# Patient Record
Sex: Male | Born: 1985
Health system: Southern US, Community
[De-identification: ages and names within clinical notes are randomized; demographics above are authoritative.]

## PROBLEM LIST (undated history)

## (undated) ENCOUNTER — Emergency Department (HOSPITAL_COMMUNITY): Admission: EM | Payer: 59

## (undated) DIAGNOSIS — J302 Other seasonal allergic rhinitis: Secondary | ICD-10-CM

## (undated) DIAGNOSIS — R42 Dizziness and giddiness: Secondary | ICD-10-CM

## (undated) DIAGNOSIS — K219 Gastro-esophageal reflux disease without esophagitis: Secondary | ICD-10-CM

## (undated) DIAGNOSIS — U071 COVID-19: Secondary | ICD-10-CM

## (undated) DIAGNOSIS — I10 Essential (primary) hypertension: Secondary | ICD-10-CM

## (undated) DIAGNOSIS — F419 Anxiety disorder, unspecified: Secondary | ICD-10-CM

## (undated) DIAGNOSIS — E559 Vitamin D deficiency, unspecified: Secondary | ICD-10-CM

## (undated) HISTORY — DX: Dizziness and giddiness: R42

## (undated) HISTORY — DX: Anxiety disorder, unspecified: F41.9

## (undated) HISTORY — DX: COVID-19: U07.1

---
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
## 2006-02-12 ENCOUNTER — Emergency Department (HOSPITAL_COMMUNITY): Admission: EM | Admit: 2006-02-12 | Discharge: 2006-02-12 | Payer: Self-pay | Admitting: Family Medicine

## 2006-03-18 ENCOUNTER — Emergency Department (HOSPITAL_COMMUNITY): Admission: EM | Admit: 2006-03-18 | Discharge: 2006-03-18 | Payer: Self-pay | Admitting: Family Medicine

## 2006-10-08 ENCOUNTER — Emergency Department (HOSPITAL_COMMUNITY): Admission: EM | Admit: 2006-10-08 | Discharge: 2006-10-09 | Payer: Self-pay | Admitting: Emergency Medicine

## 2006-11-28 ENCOUNTER — Emergency Department (HOSPITAL_COMMUNITY): Admission: EM | Admit: 2006-11-28 | Discharge: 2006-11-28 | Payer: Self-pay | Admitting: Emergency Medicine

## 2008-04-23 ENCOUNTER — Emergency Department (HOSPITAL_COMMUNITY): Admission: EM | Admit: 2008-04-23 | Discharge: 2008-04-23 | Payer: Self-pay | Admitting: *Deleted

## 2009-01-24 ENCOUNTER — Emergency Department (HOSPITAL_COMMUNITY): Admission: EM | Admit: 2009-01-24 | Discharge: 2009-01-24 | Payer: Self-pay | Admitting: Emergency Medicine

## 2009-03-03 ENCOUNTER — Emergency Department (HOSPITAL_COMMUNITY): Admission: EM | Admit: 2009-03-03 | Discharge: 2009-03-03 | Payer: Self-pay | Admitting: Emergency Medicine

## 2009-03-06 ENCOUNTER — Emergency Department (HOSPITAL_COMMUNITY): Admission: EM | Admit: 2009-03-06 | Discharge: 2009-03-06 | Payer: Self-pay | Admitting: Emergency Medicine

## 2009-11-02 ENCOUNTER — Emergency Department (HOSPITAL_COMMUNITY): Admission: EM | Admit: 2009-11-02 | Discharge: 2009-11-02 | Payer: Self-pay | Admitting: Emergency Medicine

## 2010-05-11 LAB — COMPREHENSIVE METABOLIC PANEL
ALT: 65 U/L — ABNORMAL HIGH (ref 0–53)
Albumin: 4.2 g/dL (ref 3.5–5.2)
Alkaline Phosphatase: 46 U/L (ref 39–117)
CO2: 24 mEq/L (ref 19–32)
Calcium: 9.2 mg/dL (ref 8.4–10.5)
Chloride: 103 mEq/L (ref 96–112)
Creatinine, Ser: 1.1 mg/dL (ref 0.4–1.5)
Sodium: 137 mEq/L (ref 135–145)
Total Bilirubin: 0.8 mg/dL (ref 0.3–1.2)
Total Protein: 7.3 g/dL (ref 6.0–8.3)

## 2010-05-11 LAB — DIFFERENTIAL
Eosinophils Relative: 0 % (ref 0–5)
Monocytes Absolute: 0.4 10*3/uL (ref 0.1–1.0)
Neutrophils Relative %: 83 % — ABNORMAL HIGH (ref 43–77)

## 2010-05-11 LAB — CBC
HCT: 48 % (ref 39.0–52.0)
MCH: 31.8 pg (ref 26.0–34.0)
MCV: 89.9 fL (ref 78.0–100.0)
Platelets: 190 10*3/uL (ref 150–400)
RBC: 5.34 MIL/uL (ref 4.22–5.81)
RDW: 13.4 % (ref 11.5–15.5)
WBC: 9.3 10*3/uL (ref 4.0–10.5)

## 2010-07-14 NOTE — Op Note (Signed)
NAMEJABRIEL, VANDUYNE                ACCOUNT NO.:  [id number]   MEDICAL RECORD NO.:  [id number]          PATIENT TYPE:  AMB   LOCATION:  [facility]                          FACILITY:  [facility]   PHYSICIAN:  Prabhakar D. Pendse, M.D.DATE OF BIRTH:  1985/11/09   DATE OF PROCEDURE:  09/27/2004  DATE OF DISCHARGE:                                 OPERATIVE REPORT   PREOPERATIVE DIAGNOSIS:  Abscess of the right thigh.   POSTOPERATIVE DIAGNOSIS:  Abscess of the right thigh.   OPERATION PERFORMED:  Incision and drainage of abscess of the right thigh.   SURGEON:  Prabhakar D. Levie Heritage, M.D.   ASSISTANT:  Nurse.   ANESTHESIA:  Nurse.   OPERATIVE PROCEDURE:  Under satisfactory general anesthesia, the patient in  supine position, right thigh region was thoroughly prepped and draped in the  usual manner.  About 3-cm-long transverse incision was made directly over  the most prominent part of the abscess and abscess cavity entered, drained,  purulent material was obtained, the abscess cavity irrigated, packed with  Iodoform gauze, bulky dressing applied.  Throughout the procedure, the  patient's vital signs remained stable.  The patient withstood the procedure  well and was transferred to recovery room in satisfactory general condition.       PDP/MEDQ  D:  09/27/2004  T:  09/28/2004  Job:  [id number]   cc:   [facility]

## 2010-09-12 ENCOUNTER — Inpatient Hospital Stay (INDEPENDENT_AMBULATORY_CARE_PROVIDER_SITE_OTHER)
Admission: RE | Admit: 2010-09-12 | Discharge: 2010-09-12 | Disposition: A | Discharge status: Home / Self Care | Payer: Self-pay | Source: Ambulatory Visit | Attending: Family Medicine | Admitting: Family Medicine

## 2010-09-12 DIAGNOSIS — R071 Chest pain on breathing: Secondary | ICD-10-CM

## 2010-12-07 LAB — POCT RAPID STREP A: Streptococcus, Group A Screen (Direct): NEGATIVE

## 2010-12-07 LAB — STREP A DNA PROBE: Group A Strep Probe: NEGATIVE

## 2011-02-05 ENCOUNTER — Emergency Department (HOSPITAL_COMMUNITY)
Admission: EM | Admit: 2011-02-05 | Discharge: 2011-02-06 | Disposition: A | Discharge status: Home / Self Care | Payer: Self-pay | Attending: Emergency Medicine | Admitting: Emergency Medicine

## 2011-02-05 ENCOUNTER — Emergency Department (HOSPITAL_COMMUNITY): Payer: Self-pay

## 2011-02-05 ENCOUNTER — Encounter: Payer: Self-pay | Admitting: Adult Health

## 2011-02-05 DIAGNOSIS — R079 Chest pain, unspecified: Secondary | ICD-10-CM | POA: Insufficient documentation

## 2011-02-05 DIAGNOSIS — R1012 Left upper quadrant pain: Secondary | ICD-10-CM | POA: Insufficient documentation

## 2011-02-05 NOTE — ED Notes (Signed)
C/o of left sided pain that began 2 weeks ago and is located under left rib cage, hurts worse with movement, strainnig for  a BM, deep inspiration and cough. Bilateral lung sounds clear. Sats 97%  RA

## 2011-02-06 MED ORDER — HYDROCODONE-ACETAMINOPHEN 5-325 MG PO TABS: 1.0000 | ORAL_TABLET | ORAL | Status: AC | PRN

## 2011-02-06 NOTE — ED Provider Notes (Signed)
History     CSN: [id number] Arrival date & time: 02/05/2011 11:07 PM   First MD Initiated Contact with Patient 02/05/11 2314      Chief Complaint  Patient presents with  . Pleurisy    (Consider location/radiation/quality/duration/timing/severity/associated sxs/prior treatment) HPI History provided by pt.   Pt has had pain in his left side for the past 2 weeks.  Aggravated by coughing and valsalva.  No associated fever, cough, SOB, N/V.  Has had diarrhea.  Denies trauma and no recent heavy lifting.    Past Medical History  Diagnosis Date  . Asthma     History reviewed. No pertinent past surgical history.  History reviewed. No pertinent family history.  History  Substance Use Topics  . Smoking status: Current Some Day Smoker  . Smokeless tobacco: Not on file  . Alcohol Use: No      Review of Systems  All other systems reviewed and are negative.    Allergies  Review of patient's allergies indicates no known allergies.  Home Medications   Current Outpatient Rx  Name Route Sig Dispense Refill  . HYDROCODONE-ACETAMINOPHEN 5-325 MG PO TABS Oral Take 1 tablet by mouth every 4 (four) hours as needed for pain. 20 tablet 0    BP 160/97  Pulse 65  Temp 98.2 F (36.8 C)  Resp 20  SpO2 97%  Physical Exam  Nursing note and vitals reviewed. Constitutional: He is oriented to person, place, and time. He appears well-developed and well-nourished. No distress.  HENT:  Head: Normocephalic and atraumatic.  Mouth/Throat: Oropharynx is clear and moist. No oropharyngeal exudate.  Eyes:       Normal appearance  Neck: Normal range of motion.  Cardiovascular: Normal rate and regular rhythm.   Pulmonary/Chest: Effort normal and breath sounds normal. He exhibits no tenderness.  Abdominal: Soft. Bowel sounds are normal. He exhibits no distension.       Tenderness left side, just inferior to rib cage. No overlying skin changes. No mass. No obvious splenomegaly.    Lymphadenopathy:    He has no cervical adenopathy.  Neurological: He is alert and oriented to person, place, and time.  Skin: Skin is warm and dry. No rash noted.  Psychiatric: He has a normal mood and affect. His behavior is normal.    ED Course  Procedures (including critical care time)   Labs Reviewed  GLUCOSE, CAPILLARY  LAB REPORT - SCANNED   Dg Chest 2 View  02/05/2011  *RADIOLOGY REPORT*  Clinical Data: Chest pain.  CHEST - 2 VIEW  Comparison: 01/24/2009.  Findings: No infiltrate, congestive heart failure or pneumothorax. Heart size within normal limits.  Central pulmonary vascular prominence stable.  IMPRESSION: No acute abnormality.  Original Report Authenticated By: Fuller Canada, M.D.     1. Acute abdominal pain in left upper quadrant       MDM  Healthy 25yo M presents w/ non-traumatic left side pain.  No associated pulmonary sx.  Recent diarrhea but otherwise no GI sx.  No fever.  Mod-sev tenderness left side, just inferior to rib cage on exam.  No obvious splenomegaly.  No signs of mono. CXR neg for pneumonia.  Pt may have a viral illness causing splenomegaly and pain of spleen or may be musculoskeletal.  Explained to pt why further imaging will likely not change our course of tmnt today.  Recommended no contact sports x 4 wks, no heavy lifting, f/u with PCP and return to ER if worsening pain, fever or N/V.  Prescribed vicodin.         Otilio Miu, [place] 02/06/11 1139

## 2011-02-08 NOTE — ED Provider Notes (Signed)
Medical screening examination/treatment/procedure(s) were performed by non-physician practitioner and as supervising physician I was immediately available for consultation/collaboration.  Flint Melter, MD 02/08/11 [phone]

## 2011-07-25 ENCOUNTER — Encounter (HOSPITAL_COMMUNITY): Payer: Self-pay | Admitting: Emergency Medicine

## 2011-07-25 ENCOUNTER — Emergency Department (HOSPITAL_COMMUNITY)
Admission: EM | Admit: 2011-07-25 | Discharge: 2011-07-25 | Discharge status: Against Medical Advice | Payer: Self-pay | Attending: Emergency Medicine | Admitting: Emergency Medicine

## 2011-07-25 DIAGNOSIS — R51 Headache: Secondary | ICD-10-CM | POA: Insufficient documentation
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
## 2011-09-18 ENCOUNTER — Emergency Department (HOSPITAL_COMMUNITY)
Admission: EM | Admit: 2011-09-18 | Discharge: 2011-09-18 | Disposition: A | Discharge status: Home / Self Care | Payer: Self-pay | Attending: Emergency Medicine | Admitting: Emergency Medicine

## 2011-09-18 ENCOUNTER — Encounter (HOSPITAL_COMMUNITY): Payer: Self-pay | Admitting: *Deleted

## 2011-09-18 DIAGNOSIS — T22139A Burn of first degree of unspecified upper arm, initial encounter: Secondary | ICD-10-CM | POA: Insufficient documentation

## 2011-09-18 DIAGNOSIS — F411 Generalized anxiety disorder: Secondary | ICD-10-CM | POA: Insufficient documentation

## 2011-09-18 DIAGNOSIS — T2122XA Burn of second degree of abdominal wall, initial encounter: Secondary | ICD-10-CM | POA: Insufficient documentation

## 2011-09-18 DIAGNOSIS — T2102XA Burn of unspecified degree of abdominal wall, initial encounter: Secondary | ICD-10-CM

## 2011-09-18 DIAGNOSIS — T2200XA Burn of unspecified degree of shoulder and upper limb, except wrist and hand, unspecified site, initial encounter: Secondary | ICD-10-CM

## 2011-09-18 DIAGNOSIS — X118XXA Contact with other hot tap-water, initial encounter: Secondary | ICD-10-CM | POA: Insufficient documentation

## 2011-09-18 MED ORDER — SILVER SULFADIAZINE 1 % EX CREA
TOPICAL_CREAM | Freq: Once | CUTANEOUS | Status: AC
  Administered 2011-09-18: 02:00:00 via TOPICAL
  Filled 2011-09-18: qty 50

## 2011-09-18 MED ORDER — IBUPROFEN 800 MG PO TABS: 800.0000 mg | ORAL_TABLET | Freq: Three times a day (TID) | ORAL | Status: DC

## 2011-09-18 MED ORDER — DIPHENHYDRAMINE HCL 50 MG/ML IJ SOLN
25.0000 mg | Freq: Once | INTRAMUSCULAR | Status: AC
  Administered 2011-09-18: 25 mg via INTRAMUSCULAR
  Filled 2011-09-18 (×2): qty 1

## 2011-09-18 MED ORDER — KETOROLAC TROMETHAMINE 30 MG/ML IJ SOLN
30.0000 mg | Freq: Once | INTRAMUSCULAR | Status: AC
  Administered 2011-09-18: 30 mg via INTRAVENOUS
  Filled 2011-09-18: qty 1
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
## 2011-09-20 ENCOUNTER — Emergency Department (HOSPITAL_COMMUNITY)
Admission: EM | Admit: 2011-09-20 | Discharge: 2011-09-20 | Disposition: A | Discharge status: Home / Self Care | Payer: Self-pay | Attending: Emergency Medicine | Admitting: Emergency Medicine

## 2011-09-20 ENCOUNTER — Encounter (HOSPITAL_COMMUNITY): Payer: Self-pay | Admitting: *Deleted

## 2011-09-20 DIAGNOSIS — R55 Syncope and collapse: Secondary | ICD-10-CM | POA: Insufficient documentation

## 2011-09-20 DIAGNOSIS — I10 Essential (primary) hypertension: Secondary | ICD-10-CM | POA: Insufficient documentation

## 2011-09-20 DIAGNOSIS — Z87891 Personal history of nicotine dependence: Secondary | ICD-10-CM | POA: Insufficient documentation

## 2011-09-20 HISTORY — DX: Essential (primary) hypertension: I10

## 2011-09-20 LAB — POCT I-STAT, CHEM 8
Calcium, Ion: 1.22 mmol/L (ref 1.12–1.23)
Glucose, Bld: 99 mg/dL (ref 70–99)
HCT: 49 % (ref 39.0–52.0)
Hemoglobin: 16.7 g/dL (ref 13.0–17.0)
Potassium: 3.6 mEq/L (ref 3.5–5.1)
Sodium: 144 mEq/L (ref 135–145)

## 2011-09-20 MED ORDER — HYDROCHLOROTHIAZIDE 25 MG PO TABS: 25.0000 mg | ORAL_TABLET | Freq: Every day | ORAL | Status: DC
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
## 2011-12-04 ENCOUNTER — Emergency Department (HOSPITAL_COMMUNITY)
Admission: EM | Admit: 2011-12-04 | Discharge: 2011-12-04 | Disposition: A | Discharge status: Home / Self Care | Payer: Self-pay | Attending: Emergency Medicine | Admitting: Emergency Medicine

## 2011-12-04 ENCOUNTER — Encounter (HOSPITAL_COMMUNITY): Payer: Self-pay | Admitting: *Deleted

## 2011-12-04 DIAGNOSIS — Z87891 Personal history of nicotine dependence: Secondary | ICD-10-CM | POA: Insufficient documentation

## 2011-12-04 DIAGNOSIS — M549 Dorsalgia, unspecified: Secondary | ICD-10-CM | POA: Insufficient documentation

## 2011-12-04 DIAGNOSIS — I10 Essential (primary) hypertension: Secondary | ICD-10-CM | POA: Insufficient documentation

## 2011-12-04 LAB — URINALYSIS, ROUTINE W REFLEX MICROSCOPIC
Glucose, UA: NEGATIVE mg/dL
Hgb urine dipstick: NEGATIVE
Leukocytes, UA: NEGATIVE
Specific Gravity, Urine: 1.016 (ref 1.005–1.030)
pH: 7 (ref 5.0–8.0)

## 2011-12-04 MED ORDER — METHOCARBAMOL 500 MG PO TABS: 1000.0000 mg | ORAL_TABLET | Freq: Four times a day (QID) | ORAL | Status: DC

## 2011-12-04 MED ORDER — HYDROCODONE-ACETAMINOPHEN 5-325 MG PO TABS: ORAL_TABLET | ORAL | Status: DC
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
## 2011-12-04 NOTE — ED Notes (Signed)
Pt reports right hip back pain x2 weeks. Radiates to left hip and around right side to flank. Pt reports trying heat and pain medication with no relief. Denies trauma or fall.

## 2011-12-04 NOTE — ED Provider Notes (Signed)
Medical screening examination/treatment/procedure(s) were performed by non-physician practitioner and as supervising physician I was immediately available for consultation/collaboration.   Gavin Pound. Asim Gersten, MD 12/04/11 2356

## 2011-12-04 NOTE — ED Notes (Signed)
MD at bedside. 

## 2011-12-07 ENCOUNTER — Encounter (HOSPITAL_COMMUNITY): Payer: Self-pay | Admitting: Emergency Medicine

## 2011-12-07 ENCOUNTER — Emergency Department (HOSPITAL_COMMUNITY)
Admission: EM | Admit: 2011-12-07 | Discharge: 2011-12-07 | Disposition: A | Discharge status: Home / Self Care | Payer: Self-pay | Attending: Emergency Medicine | Admitting: Emergency Medicine

## 2011-12-07 DIAGNOSIS — Z87891 Personal history of nicotine dependence: Secondary | ICD-10-CM | POA: Insufficient documentation

## 2011-12-07 DIAGNOSIS — I10 Essential (primary) hypertension: Secondary | ICD-10-CM | POA: Insufficient documentation

## 2011-12-07 DIAGNOSIS — F419 Anxiety disorder, unspecified: Secondary | ICD-10-CM

## 2011-12-07 DIAGNOSIS — R110 Nausea: Secondary | ICD-10-CM | POA: Insufficient documentation

## 2011-12-07 DIAGNOSIS — J45909 Unspecified asthma, uncomplicated: Secondary | ICD-10-CM | POA: Insufficient documentation

## 2011-12-07 DIAGNOSIS — F411 Generalized anxiety disorder: Secondary | ICD-10-CM | POA: Insufficient documentation

## 2011-12-07 MED ORDER — HYDROXYZINE HCL 10 MG PO TABS: 10.0000 mg | ORAL_TABLET | Freq: Three times a day (TID) | ORAL | Status: DC | PRN

## 2011-12-07 MED ORDER — ONDANSETRON HCL 4 MG PO TABS: 4.0000 mg | ORAL_TABLET | Freq: Three times a day (TID) | ORAL | Status: DC | PRN

## 2011-12-07 MED ORDER — ONDANSETRON 8 MG PO TBDP
8.0000 mg | ORAL_TABLET | Freq: Once | ORAL | Status: AC
  Administered 2011-12-07: 8 mg via ORAL
  Filled 2011-12-07: qty 1

## 2011-12-07 NOTE — ED Notes (Signed)
Pt presenting to ed with c/o elevated blood pressure and abdominal pain with positive nausea no vomiting. Pt states his friend recently died and he has some family issues going on. Pt states he's really stressed. Pt states he's also been feeling like he's about to pass out. Pt states he thinks he has anxiety.

## 2011-12-07 NOTE — ED Provider Notes (Signed)
History     CSN: [id number]  Arrival date & time 12/07/11  1140   First MD Initiated Contact with Patient 12/07/11 1210      Chief Complaint  Patient presents with  . Abdominal Pain  . Nausea    (Consider location/radiation/quality/duration/timing/severity/associated sxs/prior treatment) HPI  26 y.o. male in no acute distress complaining of nausea and decreased by mouth intake over the last 48 hours. Patient denies any fever, abdominal pain, change in bowel or bladder habits. Patient had a friend passed away 3 days ago and has been under stress since that time he feels like he may have anxiety disorder and has had panic attack in the last 48 hours he is currently not an acute anxiety. He also reports a lightheaded sensation and exacerbated by walking.  Past Medical History  Diagnosis Date  . Asthma   . Hypertension     History reviewed. No pertinent past surgical history.  No family history on file.  History  Substance Use Topics  . Smoking status: Former Games developer  . Smokeless tobacco: Not on file  . Alcohol Use: No      Review of Systems  Constitutional: Negative for fever.  Respiratory: Negative for shortness of breath.   Cardiovascular: Negative for chest pain.  Gastrointestinal: Positive for nausea. Negative for vomiting, abdominal pain and diarrhea.  Neurological: Positive for light-headedness.  All other systems reviewed and are negative.    Allergies  Review of patient's allergies indicates no known allergies.  Home Medications   Current Outpatient Rx  Name Route Sig Dispense Refill  . CETIRIZINE HCL 10 MG PO TABS Oral Take 10 mg by mouth daily as needed. For allergies.    Marland Kitchen HYDROCHLOROTHIAZIDE 25 MG PO TABS Oral Take 1 tablet (25 mg total) by mouth daily. 30 tablet 1  . HYDROCODONE-ACETAMINOPHEN 5-325 MG PO TABS  Take 1-2 tablets every 6 hours as needed for severe pain 8 tablet 0  . IBUPROFEN 200 MG PO TABS Oral Take 400 mg by mouth every 8 (eight)  hours as needed. For pain.    Marland Kitchen METHOCARBAMOL 500 MG PO TABS Oral Take 2 tablets (1,000 mg total) by mouth 4 (four) times daily. 20 tablet 0  . ADULT MULTIVITAMIN W/MINERALS CH Oral Take 1 tablet by mouth daily.      BP 142/107  Pulse 100  Temp 98.2 F (36.8 C) (Oral)  Resp 20  SpO2 98%  Physical Exam  Nursing note and vitals reviewed. Constitutional: He is oriented to person, place, and time. He appears well-developed and well-nourished. No distress.  HENT:  Head: Normocephalic.  Mouth/Throat: Oropharynx is clear and moist.       Palpebral conjunctiva are ruddy.  Eyes: Conjunctivae normal and EOM are normal. Pupils are equal, round, and reactive to light.  Cardiovascular: Normal rate and intact distal pulses.   Pulmonary/Chest: Effort normal and breath sounds normal. No stridor. No respiratory distress. He has no wheezes. He has no rales. He exhibits no tenderness.  Abdominal: Soft. Bowel sounds are normal. He exhibits no distension and no mass. There is no tenderness. There is no rebound and no guarding.  Musculoskeletal: Normal range of motion.  Neurological: He is alert and oriented to person, place, and time.  Skin: Skin is warm.  Psychiatric: He has a normal mood and affect.    ED Course  Procedures (including critical care time)  Labs Reviewed - No data to display No results found.   1. Anxiety  MDM  Physical exam is normal I think this is likely an anxiety attack. We'll give the patient Atarax and Zofran for nausea control. I will advise the patient to educate himself on anxiety and panic disorder so that stronger medications or not needed in the future.    Pt verbalized understanding and agrees with care plan. Outpatient follow-up and return precautions given.    New Prescriptions   HYDROXYZINE (ATARAX/VISTARIL) 10 MG TABLET    Take 1 tablet (10 mg total) by mouth 3 (three) times daily as needed for anxiety.   ONDANSETRON (ZOFRAN) 4 MG TABLET    Take 1  tablet (4 mg total) by mouth every 8 (eight) hours as needed for nausea.       Wynetta Emery, PA-C 12/07/11 1228

## 2011-12-07 NOTE — ED Notes (Signed)
Pt states "I've been dizzy & nauseated x 2 days, got a lot of family issues going on and I had a friend just die"

## 2011-12-07 NOTE — ED Provider Notes (Signed)
Medical screening examination/treatment/procedure(s) were performed by non-physician practitioner and as supervising physician I was immediately available for consultation/collaboration.   Lyanne Co, MD 12/07/11 [phone]

## 2011-12-08 ENCOUNTER — Emergency Department (HOSPITAL_COMMUNITY)
Admission: EM | Admit: 2011-12-08 | Discharge: 2011-12-08 | Disposition: A | Discharge status: Home / Self Care | Payer: Self-pay | Attending: Emergency Medicine | Admitting: Emergency Medicine

## 2011-12-08 ENCOUNTER — Encounter (HOSPITAL_COMMUNITY): Payer: Self-pay | Admitting: *Deleted

## 2011-12-08 DIAGNOSIS — K068 Other specified disorders of gingiva and edentulous alveolar ridge: Secondary | ICD-10-CM

## 2011-12-08 DIAGNOSIS — K0510 Chronic gingivitis, plaque induced: Secondary | ICD-10-CM | POA: Insufficient documentation

## 2011-12-08 DIAGNOSIS — I10 Essential (primary) hypertension: Secondary | ICD-10-CM | POA: Insufficient documentation

## 2011-12-08 DIAGNOSIS — Z87891 Personal history of nicotine dependence: Secondary | ICD-10-CM | POA: Insufficient documentation

## 2011-12-08 DIAGNOSIS — K055 Other periodontal diseases: Secondary | ICD-10-CM | POA: Insufficient documentation

## 2011-12-08 LAB — CBC WITH DIFFERENTIAL/PLATELET
Basophils Absolute: 0 10*3/uL (ref 0.0–0.1)
HCT: 50.7 % (ref 39.0–52.0)
Hemoglobin: 18.7 g/dL — ABNORMAL HIGH (ref 13.0–17.0)
Lymphocytes Relative: 24 % (ref 12–46)
Lymphs Abs: 1.7 10*3/uL (ref 0.7–4.0)
MCV: 87.3 fL (ref 78.0–100.0)
Monocytes Absolute: 0.4 10*3/uL (ref 0.1–1.0)
Monocytes Relative: 6 % (ref 3–12)
Neutro Abs: 4.9 10*3/uL (ref 1.7–7.7)
RBC: 5.81 MIL/uL (ref 4.22–5.81)
RDW: 13.2 % (ref 11.5–15.5)
WBC: 7 10*3/uL (ref 4.0–10.5)

## 2011-12-08 NOTE — ED Notes (Signed)
Pt states that his gums "just started bleeding." Pt states he has had issues with this before where "my gums just start bleeding sometimes." Denies any injury to area. Area that is bleeding is in back of right upper side.

## 2011-12-08 NOTE — ED Provider Notes (Signed)
History     CSN: [id number]  Arrival date & time 12/08/11  1826   First MD Initiated Contact with Patient 12/08/11 2030      Chief Complaint  Patient presents with  . Dental Pain   HPI  History provided by the patient. Patient is a 26 year old male with history of asthma and hypertension who presents with complaints of continuing need bleeding from right upper gum and mouth. Patient denies having any injury or trauma but reports having some bleeding that has been steadily oozing for the past several hours. Patient has been trying to rinse his mouth but has not had any improvement. He has not used any other treatments to stop the bleeding. He has no prior history of bleeding disorders. He denies heavy alcohol use. Patient denies any lightheadedness, shortness of breath or fatigue.    Past Medical History  Diagnosis Date  . Asthma   . Hypertension     History reviewed. No pertinent past surgical history.  No family history on file.  History  Substance Use Topics  . Smoking status: Former Games developer  . Smokeless tobacco: Not on file  . Alcohol Use: No      Review of Systems  Constitutional: Negative for fever, chills and fatigue.  HENT: Negative for sore throat, mouth sores and dental problem.   Gastrointestinal: Negative for nausea and vomiting.  Neurological: Negative for dizziness, syncope and light-headedness.    Allergies  Review of patient's allergies indicates no known allergies.  Home Medications   Current Outpatient Rx  Name Route Sig Dispense Refill  . CETIRIZINE HCL 10 MG PO TABS Oral Take 10 mg by mouth daily as needed. For allergies.    Marland Kitchen HYDROCHLOROTHIAZIDE 25 MG PO TABS Oral Take 1 tablet (25 mg total) by mouth daily. 30 tablet 1  . IBUPROFEN 200 MG PO TABS Oral Take 400 mg by mouth every 8 (eight) hours as needed. For pain.    . ADULT MULTIVITAMIN W/MINERALS CH Oral Take 1 tablet by mouth daily.      BP 141/101  Pulse 99  Temp 98.4 F (36.9 C)  (Oral)  Resp 16  Ht 5\' 11"  (1.803 m)  Wt 240 lb (108.863 kg)  BMI 33.47 kg/m2  SpO2 96%  Physical Exam  Nursing note and vitals reviewed. Constitutional: He is oriented to person, place, and time. He appears well-developed and well-nourished. No distress.  HENT:  Head: Normocephalic.  Mouth/Throat:         Small steady bleeding around the gums of right upper second and third molar. There is dental care he to the anterior outer part of the third molar around source of bleeding. The gums are swollen.  Neck: Normal range of motion. Neck supple.  Cardiovascular: Normal rate and regular rhythm.   No murmur heard. Pulmonary/Chest: Effort normal and breath sounds normal. No respiratory distress. He has no wheezes. He has no rales.  Lymphadenopathy:    He has no cervical adenopathy.  Neurological: He is alert and oriented to person, place, and time.  Skin: Skin is warm.  Psychiatric: He has a normal mood and affect. His behavior is normal.    ED Course  Procedures   I-STAT chem 8 Sodium 143, potassium 4.0, chloride 106, I calcium 1.21, CO2 25, glucose 107, UN 18, creatinine 1.1, hemoglobin 19.0, hematocrit 56%    1. Bleeding gums   2. Gingivitis       MDM  9:00PM patient seen and evaluated. Patient is in no  acute distress.    Bleeding was controlled with packing and gauze.    Angus Seller, [place] 12/09/11 2123

## 2011-12-08 NOTE — ED Notes (Addendum)
Patient reports he noted onset of spitting up blood/bleeding from his gums an hour ago.  He states he does not have pain.  He has had hot flashes and has felt nauseated.  Patient states he has intermittent bleed from his mouth for 3 weeks

## 2011-12-10 LAB — POCT I-STAT, CHEM 8
BUN: 18 mg/dL (ref 6–23)
Calcium, Ion: 1.21 mmol/L (ref 1.12–1.23)
Chloride: 106 mEq/L (ref 96–112)
Creatinine, Ser: 1.1 mg/dL (ref 0.50–1.35)

## 2011-12-10 NOTE — ED Provider Notes (Signed)
Medical screening examination/treatment/procedure(s) were performed by non-physician practitioner and as supervising physician I was immediately available for consultation/collaboration.  Doug Sou, MD 12/10/11 0120

## 2012-02-10 ENCOUNTER — Emergency Department (HOSPITAL_COMMUNITY)
Admission: EM | Admit: 2012-02-10 | Discharge: 2012-02-10 | Discharge status: Against Medical Advice | Payer: Self-pay | Attending: Emergency Medicine | Admitting: Emergency Medicine

## 2012-02-10 DIAGNOSIS — H538 Other visual disturbances: Secondary | ICD-10-CM | POA: Insufficient documentation

## 2012-02-10 DIAGNOSIS — R51 Headache: Secondary | ICD-10-CM | POA: Insufficient documentation

## 2012-02-10 DIAGNOSIS — I10 Essential (primary) hypertension: Secondary | ICD-10-CM | POA: Insufficient documentation

## 2012-02-10 NOTE — ED Notes (Signed)
Pt comes to window and states "I just would like to leave instead of stay." pt encouraged to stay. Pt insisted on leaving.

## 2012-02-10 NOTE — ED Notes (Signed)
Pt arrives by [facility] c/o pressure behind head and blurred vision. Reports hx of htn and stress.

## 2012-04-07 ENCOUNTER — Emergency Department (HOSPITAL_COMMUNITY)
Admission: EM | Admit: 2012-04-07 | Discharge: 2012-04-07 | Disposition: A | Discharge status: Home / Self Care | Payer: Self-pay | Attending: Emergency Medicine | Admitting: Emergency Medicine

## 2012-04-07 ENCOUNTER — Encounter (HOSPITAL_COMMUNITY): Payer: Self-pay | Admitting: Emergency Medicine

## 2012-04-07 DIAGNOSIS — M5431 Sciatica, right side: Secondary | ICD-10-CM

## 2012-04-07 DIAGNOSIS — I10 Essential (primary) hypertension: Secondary | ICD-10-CM | POA: Insufficient documentation

## 2012-04-07 DIAGNOSIS — J45909 Unspecified asthma, uncomplicated: Secondary | ICD-10-CM | POA: Insufficient documentation

## 2012-04-07 DIAGNOSIS — R209 Unspecified disturbances of skin sensation: Secondary | ICD-10-CM | POA: Insufficient documentation

## 2012-04-07 DIAGNOSIS — Z79899 Other long term (current) drug therapy: Secondary | ICD-10-CM | POA: Insufficient documentation

## 2012-04-07 DIAGNOSIS — M5430 Sciatica, unspecified side: Secondary | ICD-10-CM | POA: Insufficient documentation

## 2012-04-07 DIAGNOSIS — Z87891 Personal history of nicotine dependence: Secondary | ICD-10-CM | POA: Insufficient documentation

## 2012-04-07 DIAGNOSIS — Z760 Encounter for issue of repeat prescription: Secondary | ICD-10-CM | POA: Insufficient documentation

## 2012-04-07 MED ORDER — HYDROCODONE-ACETAMINOPHEN 5-325 MG PO TABS: 1.0000 | ORAL_TABLET | ORAL | Status: DC | PRN

## 2012-04-07 MED ORDER — IBUPROFEN 400 MG PO TABS
600.0000 mg | ORAL_TABLET | Freq: Once | ORAL | Status: AC
  Administered 2012-04-07: 600 mg via ORAL
  Filled 2012-04-07: qty 1

## 2012-04-07 MED ORDER — IBUPROFEN 600 MG PO TABS: 600.0000 mg | ORAL_TABLET | Freq: Four times a day (QID) | ORAL | Status: DC | PRN

## 2012-04-07 MED ORDER — METHOCARBAMOL 500 MG PO TABS: 500.0000 mg | ORAL_TABLET | Freq: Two times a day (BID) | ORAL | Status: DC

## 2012-04-07 MED ORDER — HYDROCHLOROTHIAZIDE 25 MG PO TABS: 25.0000 mg | ORAL_TABLET | Freq: Every day | ORAL | Status: DC

## 2012-04-07 NOTE — ED Provider Notes (Addendum)
History     This chart was scribed for Derwood Kaplan, MD, MD by Smitty Pluck, ED Scribe. The patient was seen in room TR05C/TR05C and the patient's care was started at 4:15 PM.   CSN: [id number]  Arrival date & time 04/07/12  1417      No chief complaint on file.   (Consider location/radiation/quality/duration/timing/severity/associated sxs/prior treatment) The history is provided by the patient. No language interpreter was used.   Walter Howell is a 27 y.o. male with hx of HTN and asthma who presents to the Emergency Department complaining of constant, sharp right lower back pain that radiates down right leg onset 5 days ago. He reports having tingling sensation in right leg. He reports that he has pain when having bowel movement. He has taken ibuprofen without relief. He reports pain is aggravated by pain. Pt denies any trauma, injury, lifting heavy objects, bowel incontinence, fever, chills, nausea, vomiting, diarrhea, weakness, cough, SOB and any other pain. He denies hx of similar pain. He reports that he ran out of HTN medication.    Past Medical History  Diagnosis Date  . Asthma   . Hypertension     History reviewed. No pertinent past surgical history.  No family history on file.  History  Substance Use Topics  . Smoking status: Former Games developer  . Smokeless tobacco: Not on file  . Alcohol Use: No      Review of Systems  Constitutional: Negative for chills.  Respiratory: Negative for cough and shortness of breath.   Gastrointestinal: Negative for nausea, vomiting and diarrhea.  Musculoskeletal: Positive for back pain.  All other systems reviewed and are negative.    Allergies  Review of patient's allergies indicates no known allergies.  Home Medications   Current Outpatient Rx  Name  Route  Sig  Dispense  Refill  . cetirizine (ZYRTEC) 10 MG tablet   Oral   Take 10 mg by mouth daily as needed. For allergies.         . hydrochlorothiazide (HYDRODIURIL) 25  MG tablet   Oral   Take 1 tablet (25 mg total) by mouth daily.   30 tablet   1   . ibuprofen (ADVIL,MOTRIN) 200 MG tablet   Oral   Take 400 mg by mouth every 8 (eight) hours as needed. For pain.         . Multiple Vitamin (MULTIVITAMIN WITH MINERALS) TABS   Oral   Take 1 tablet by mouth daily.           BP 142/83  Pulse 62  Temp(Src) 98.1 F (36.7 C)  Resp 16  SpO2 99%  Physical Exam  Nursing note and vitals reviewed. Constitutional: He is oriented to person, place, and time. He appears well-developed and well-nourished. No distress.  HENT:  Head: Normocephalic and atraumatic.  Eyes: EOM are normal.  Neck: Neck supple. No tracheal deviation present.  Cardiovascular: Normal rate, regular rhythm and normal heart sounds.   No murmur heard. Pulmonary/Chest: Effort normal and breath sounds normal. No respiratory distress. He has no wheezes. He has no rales.  Abdominal: Soft. He exhibits no distension. There is no tenderness. There is no rebound and no guarding.  Musculoskeletal: Normal range of motion.  Pt has tenderness over the lumbar region No step offs, no erythema. Pt has 2+ patellar reflex bilaterally. Able to discriminate between sharp and dull. Able to ambulate  + straight leg test  On the right side  Neurological: He is alert and oriented to  person, place, and time.  positive straight leg test on right lower extremity   Skin: Skin is warm and dry.  Psychiatric: He has a normal mood and affect. His behavior is normal.    ED Course  Procedures (including critical care time) DIAGNOSTIC STUDIES: Oxygen Saturation is 99% on room air, normal by my interpretation.    COORDINATION OF CARE: 4:19 PM Discussed ED treatment with pt and pt agrees.     Labs Reviewed - No data to display No results found.   No diagnosis found.    MDM  I personally performed the services described in this documentation, which was scribed in my presence. The recorded  information has been reviewed and is accurate.   DDx includes: - DJD of the back - Spondylitises/ spondylosis - Sciatica - Spinal cord compression - Conus medullaris - Epidural hematoma - Epidural abscess - Lytic/pathologic fracture - Myelitis - Musculoskeletal pain  Pt comes in with cc of back pain. Also requesting antihypertensives. Back pain appears to be musculoskeletal in nature. No red flags on hx or exam that are concerning, will discharge.  Derwood Kaplan, MD 04/07/12 1652  Derwood Kaplan, MD 04/07/12 4540

## 2012-04-07 NOTE — ED Notes (Signed)
Rt vleg pain and back pain  X 6 days he staes cannot remember any injury

## 2012-08-12 ENCOUNTER — Encounter (HOSPITAL_COMMUNITY): Payer: Self-pay | Admitting: Emergency Medicine

## 2012-08-12 ENCOUNTER — Emergency Department (HOSPITAL_COMMUNITY)
Admission: EM | Admit: 2012-08-12 | Discharge: 2012-08-12 | Disposition: A | Discharge status: Home / Self Care | Payer: Self-pay | Attending: Emergency Medicine | Admitting: Emergency Medicine

## 2012-08-12 DIAGNOSIS — J45901 Unspecified asthma with (acute) exacerbation: Secondary | ICD-10-CM | POA: Insufficient documentation

## 2012-08-12 DIAGNOSIS — H9319 Tinnitus, unspecified ear: Secondary | ICD-10-CM | POA: Insufficient documentation

## 2012-08-12 DIAGNOSIS — I10 Essential (primary) hypertension: Secondary | ICD-10-CM | POA: Insufficient documentation

## 2012-08-12 DIAGNOSIS — R51 Headache: Secondary | ICD-10-CM | POA: Insufficient documentation

## 2012-08-12 DIAGNOSIS — R079 Chest pain, unspecified: Secondary | ICD-10-CM | POA: Insufficient documentation

## 2012-08-12 DIAGNOSIS — Z79899 Other long term (current) drug therapy: Secondary | ICD-10-CM | POA: Insufficient documentation

## 2012-08-12 DIAGNOSIS — R519 Headache, unspecified: Secondary | ICD-10-CM

## 2012-08-12 LAB — CBC WITH DIFFERENTIAL/PLATELET
Eosinophils Relative: 1 % (ref 0–5)
HCT: 44.4 % (ref 39.0–52.0)
Lymphocytes Relative: 29 % (ref 12–46)
Lymphs Abs: 1.7 10*3/uL (ref 0.7–4.0)
MCH: 31.9 pg (ref 26.0–34.0)
MCV: 87.9 fL (ref 78.0–100.0)
Monocytes Absolute: 0.3 10*3/uL (ref 0.1–1.0)
RDW: 13.5 % (ref 11.5–15.5)
WBC: 6 10*3/uL (ref 4.0–10.5)

## 2012-08-12 LAB — POCT I-STAT, CHEM 8
BUN: 14 mg/dL (ref 6–23)
Calcium, Ion: 1.14 mmol/L (ref 1.12–1.23)
Creatinine, Ser: 1 mg/dL (ref 0.50–1.35)
TCO2: 25 mmol/L (ref 0–100)

## 2012-08-12 MED ORDER — SODIUM CHLORIDE 0.9 % IV BOLUS (SEPSIS)
1000.0000 mL | Freq: Once | INTRAVENOUS | Status: AC
  Administered 2012-08-12: 1000 mL via INTRAVENOUS

## 2012-08-12 MED ORDER — PREDNISONE 20 MG PO TABS: 60.0000 mg | ORAL_TABLET | Freq: Every day | ORAL | Status: DC

## 2012-08-12 MED ORDER — PREDNISONE 20 MG PO TABS
60.0000 mg | ORAL_TABLET | Freq: Once | ORAL | Status: AC
  Administered 2012-08-12: 60 mg via ORAL
  Filled 2012-08-12: qty 3

## 2012-08-12 MED ORDER — KETOROLAC TROMETHAMINE 30 MG/ML IJ SOLN
30.0000 mg | Freq: Once | INTRAMUSCULAR | Status: AC
  Administered 2012-08-12: 30 mg via INTRAVENOUS
  Filled 2012-08-12: qty 1

## 2012-08-12 NOTE — ED Provider Notes (Signed)
History     CSN: [id number]  Arrival date & time 08/12/12  1344   First MD Initiated Contact with Patient 08/12/12 1406      Chief Complaint  Patient presents with  . Chest Pain  . Headache    (Consider location/radiation/quality/duration/timing/severity/associated sxs/prior treatment) Patient is a 27 y.o. male presenting with chest pain and headaches.  Chest Pain Associated symptoms: headache   Headache  Pt with history of HTN reports 4 days of diffuse headache, ringing in ears and sharp chest pains. Not improved with ibuprofen at home. He is taking HCTZ but still has HTN and not had PCP follow up. No particular provoking or relieving factors.  Past Medical History  Diagnosis Date  . Asthma   . Hypertension     History reviewed. No pertinent past surgical history.  No family history on file.  History  Substance Use Topics  . Smoking status: Former Games developer  . Smokeless tobacco: Not on file  . Alcohol Use: No      Review of Systems  Cardiovascular: Positive for chest pain.  Neurological: Positive for headaches.   All other systems reviewed and are negative except as noted in HPI.   Allergies  Review of patient's allergies indicates no known allergies.  Home Medications   Current Outpatient Rx  Name  Route  Sig  Dispense  Refill  . albuterol (PROVENTIL HFA;VENTOLIN HFA) 108 (90 BASE) MCG/ACT inhaler   Inhalation   Inhale 2 puffs into the lungs daily as needed for wheezing. For shortness of breath or wheezing         . diphenhydrAMINE (BENADRYL) 25 mg capsule   Oral   Take 25 mg by mouth at bedtime as needed for itching. For allergies         . hydrochlorothiazide (HYDRODIURIL) 25 MG tablet   Oral   Take 1 tablet (25 mg total) by mouth daily.   30 tablet   1   . ibuprofen (ADVIL,MOTRIN) 600 MG tablet   Oral   Take 1 tablet (600 mg total) by mouth every 6 (six) hours as needed for pain.   30 tablet   0   . Multiple Vitamin (MULTIVITAMIN WITH  MINERALS) TABS   Oral   Take 1 tablet by mouth daily.           BP 155/98  Pulse 60  Temp(Src) 98.6 F (37 C) (Oral)  Resp 16  SpO2 98%  Physical Exam  Nursing note and vitals reviewed. Constitutional: He is oriented to person, place, and time. He appears well-developed and well-nourished.  HENT:  Head: Normocephalic and atraumatic.  Eyes: EOM are normal. Pupils are equal, round, and reactive to light.  Neck: Normal range of motion. Neck supple.  Cardiovascular: Normal rate, normal heart sounds and intact distal pulses.   Pulmonary/Chest: Effort normal. He has wheezes (faint, end expiratory). He exhibits no tenderness.  Abdominal: Bowel sounds are normal. He exhibits no distension. There is no tenderness.  Musculoskeletal: Normal range of motion. He exhibits no edema and no tenderness.  Neurological: He is alert and oriented to person, place, and time. He has normal strength. No cranial nerve deficit or sensory deficit.  Skin: Skin is warm and dry. No rash noted.  Psychiatric: He has a normal mood and affect.    ED Course  Procedures (including critical care time)  Labs Reviewed  CBC WITH DIFFERENTIAL - Abnormal; Notable for the following:    MCHC 36.3 (*)    All other  components within normal limits  POCT I-STAT, CHEM 8  POCT I-STAT TROPONIN I   No results found.   1. Asthma exacerbation, mild   2. Headache   3. HTN (hypertension)       MDM   Date: 08/12/2012  Rate: 57  Rhythm: normal sinus rhythm  QRS Axis: normal  Intervals: normal  ST/T Wave abnormalities: normal  Conduction Disutrbances: none  Narrative Interpretation: unremarkable  Benign exam, no signs of meningismus. Doubt SAH. Chest pain does not sound cardiac. Will check a single Trop as his pain has been ongoing for 4 days. Toradol for pain, IVF, check basic labs.   3:13 PM Pt has faint expiratory wheezing, has history of asthma and has been using inhaler, but no recent steroids. Plan  prednisone for wheezing, APAP/Ibu for headache and PCP followup for recheck of BP and further management. No concern for ACS with neg Trop and normal EKG.           Charles B. Bernette Mayers, MD 08/12/12 [phone]

## 2012-08-12 NOTE — ED Notes (Signed)
Pt. Stated, I've had sharpe pains in my chest for 4 days and a headache for 4 days.

## 2012-11-24 ENCOUNTER — Encounter (HOSPITAL_COMMUNITY): Payer: Self-pay | Admitting: Nurse Practitioner

## 2012-11-24 ENCOUNTER — Emergency Department (HOSPITAL_COMMUNITY)
Admission: EM | Admit: 2012-11-24 | Discharge: 2012-11-24 | Disposition: A | Discharge status: Home / Self Care | Payer: Self-pay | Attending: Emergency Medicine | Admitting: Emergency Medicine

## 2012-11-24 DIAGNOSIS — IMO0002 Reserved for concepts with insufficient information to code with codable children: Secondary | ICD-10-CM | POA: Insufficient documentation

## 2012-11-24 DIAGNOSIS — M546 Pain in thoracic spine: Secondary | ICD-10-CM | POA: Insufficient documentation

## 2012-11-24 DIAGNOSIS — M545 Low back pain, unspecified: Secondary | ICD-10-CM | POA: Insufficient documentation

## 2012-11-24 DIAGNOSIS — Z79899 Other long term (current) drug therapy: Secondary | ICD-10-CM | POA: Insufficient documentation

## 2012-11-24 DIAGNOSIS — Z87891 Personal history of nicotine dependence: Secondary | ICD-10-CM | POA: Insufficient documentation

## 2012-11-24 DIAGNOSIS — I10 Essential (primary) hypertension: Secondary | ICD-10-CM | POA: Insufficient documentation

## 2012-11-24 DIAGNOSIS — J45909 Unspecified asthma, uncomplicated: Secondary | ICD-10-CM | POA: Insufficient documentation

## 2012-11-24 MED ORDER — NAPROXEN 500 MG PO TABS: 500.0000 mg | ORAL_TABLET | Freq: Two times a day (BID) | ORAL | Status: DC

## 2012-11-24 MED ORDER — CYCLOBENZAPRINE HCL 10 MG PO TABS: 10.0000 mg | ORAL_TABLET | Freq: Three times a day (TID) | ORAL | Status: DC | PRN

## 2012-11-24 NOTE — ED Notes (Signed)
C/o upper and lower back pain since last week. Tried several otc pain meds and ice/heat with no releif. No noted activity at onset. Denies hx back pain. Ambulatory, mae

## 2012-11-24 NOTE — ED Provider Notes (Signed)
Medical screening examination/treatment/procedure(s) were performed by non-physician practitioner and as supervising physician I was immediately available for consultation/collaboration.  Kristen N Ward, DO 11/24/12 1606 

## 2012-11-24 NOTE — ED Provider Notes (Signed)
CSN: [id number]     Arrival date & time 11/24/12  1146 History  This chart was scribed for non-physician practitioner working with Layla Maw Ward, DO by Valera Castle, ED scribe. This patient was seen in room TR11C/TR11C and the patient's care was started at 12:09 PM.    Chief Complaint  Patient presents with  . Back Pain    Patient is a 27 y.o. male presenting with back pain. The history is provided by the patient. No language interpreter was used.  Back Pain Location:  Lumbar spine and thoracic spine Radiates to:  Does not radiate Pain severity:  Moderate Onset quality:  Sudden Duration:  1 week Timing:  Constant Chronicity:  Recurrent Associated symptoms: no fever    HPI Comments: Walter Howell is a 27 y.o. male asthma and hypertension who presents to the Emergency Department complaining of sudden, moderate, constant upper and lower back pain, onset 1 week ago. He denies recent injury to the area. He reports he is ambulatory. He reports trying icy hot, heat pads, and over the counter pain medication, with no relief. He reports he has been taking his blood pressure medicine. He denies any fever, SOB, or any other associated symptoms. He denies having a PCP, but is going to have one soon. He denies any other medical history.    Past Medical History  Diagnosis Date  . Asthma   . Hypertension    History reviewed. No pertinent past surgical history. History reviewed. No pertinent family history. History  Substance Use Topics  . Smoking status: Former Games developer  . Smokeless tobacco: Not on file  . Alcohol Use: No    Review of Systems  Constitutional: Negative for fever.  Respiratory: Negative for shortness of breath.   Musculoskeletal: Positive for back pain (Upper and lower back pain.).  All other systems reviewed and are negative.    Allergies  Review of patient's allergies indicates no known allergies.  Home Medications   Current Outpatient Rx  Name  Route  Sig  Dispense   Refill  . albuterol (PROVENTIL HFA;VENTOLIN HFA) 108 (90 BASE) MCG/ACT inhaler   Inhalation   Inhale 2 puffs into the lungs daily as needed for wheezing. For shortness of breath or wheezing         . diphenhydrAMINE (BENADRYL) 25 mg capsule   Oral   Take 25 mg by mouth at bedtime as needed for itching. For allergies         . EXPIRED: hydrochlorothiazide (HYDRODIURIL) 25 MG tablet   Oral   Take 1 tablet (25 mg total) by mouth daily.   30 tablet   1   . ibuprofen (ADVIL,MOTRIN) 600 MG tablet   Oral   Take 1 tablet (600 mg total) by mouth every 6 (six) hours as needed for pain.   30 tablet   0   . Multiple Vitamin (MULTIVITAMIN WITH MINERALS) TABS   Oral   Take 1 tablet by mouth daily.         . predniSONE (DELTASONE) 20 MG tablet   Oral   Take 3 tablets (60 mg total) by mouth daily.   15 tablet   0    Triage Vitals: BP 156/90  Pulse 64  Temp(Src) 98.5 F (36.9 C) (Oral)  Resp 15  Ht 5\' 11"  (1.803 m)  Wt 235 lb (106.595 kg)  BMI 32.79 kg/m2  SpO2 99%  Physical Exam  Nursing note and vitals reviewed. Constitutional: He is oriented to person, place, and time.  He appears well-developed and well-nourished. No distress.  HENT:  Head: Normocephalic and atraumatic.  Eyes: EOM are normal.  Neck: Neck supple. No tracheal deviation present.  Cardiovascular: Normal rate.   Pulmonary/Chest: Effort normal. No respiratory distress. He exhibits no tenderness.  Abdominal: Soft. There is no tenderness.  Musculoskeletal: Normal range of motion.  Upper thoracic midline and paraspinal tenderness. No swelling. No palpable spasm. No midline cervical tenderness.  Neurological: He is alert and oriented to person, place, and time.  Skin: Skin is warm and dry.  Psychiatric: He has a normal mood and affect. His behavior is normal.    ED Course  Procedures (including critical care time) Labs Review  DIAGNOSTIC STUDIES: Oxygen Saturation is 99% on room air, normal by my  interpretation.    COORDINATION OF CARE: 12:13 PM-Discussed treatment plan with pt at bedside and pt agreed to plan.      Labs Reviewed - No data to display Imaging Review No results found.  MDM  No diagnosis found. 1. Upper back pain 2. Muscular pain  Pain worse with movement, better with rest without pulmonary concern - suspect muscular back pain. 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     I personally performed the services described in this documentation, which was scribed in my presence. The recorded information has been reviewed and is accurate.     Arnoldo Hooker, PA-C 11/24/12 1240

## 2013-02-06 ENCOUNTER — Emergency Department (HOSPITAL_COMMUNITY): Payer: Self-pay

## 2013-02-06 ENCOUNTER — Encounter (HOSPITAL_COMMUNITY): Payer: Self-pay | Admitting: Emergency Medicine

## 2013-02-06 ENCOUNTER — Emergency Department (HOSPITAL_COMMUNITY)
Admission: EM | Admit: 2013-02-06 | Discharge: 2013-02-07 | Disposition: A | Discharge status: Home / Self Care | Payer: Self-pay | Attending: Emergency Medicine | Admitting: Emergency Medicine

## 2013-02-06 DIAGNOSIS — R55 Syncope and collapse: Secondary | ICD-10-CM

## 2013-02-06 DIAGNOSIS — Y9229 Other specified public building as the place of occurrence of the external cause: Secondary | ICD-10-CM | POA: Insufficient documentation

## 2013-02-06 DIAGNOSIS — R519 Headache, unspecified: Secondary | ICD-10-CM

## 2013-02-06 DIAGNOSIS — E876 Hypokalemia: Secondary | ICD-10-CM

## 2013-02-06 DIAGNOSIS — R51 Headache: Secondary | ICD-10-CM | POA: Insufficient documentation

## 2013-02-06 DIAGNOSIS — Z79899 Other long term (current) drug therapy: Secondary | ICD-10-CM | POA: Insufficient documentation

## 2013-02-06 DIAGNOSIS — IMO0002 Reserved for concepts with insufficient information to code with codable children: Secondary | ICD-10-CM | POA: Insufficient documentation

## 2013-02-06 DIAGNOSIS — E161 Other hypoglycemia: Secondary | ICD-10-CM | POA: Insufficient documentation

## 2013-02-06 DIAGNOSIS — Z87891 Personal history of nicotine dependence: Secondary | ICD-10-CM | POA: Insufficient documentation

## 2013-02-06 DIAGNOSIS — F1210 Cannabis abuse, uncomplicated: Secondary | ICD-10-CM | POA: Insufficient documentation

## 2013-02-06 DIAGNOSIS — F1290 Cannabis use, unspecified, uncomplicated: Secondary | ICD-10-CM

## 2013-02-06 DIAGNOSIS — R079 Chest pain, unspecified: Secondary | ICD-10-CM

## 2013-02-06 DIAGNOSIS — Y939 Activity, unspecified: Secondary | ICD-10-CM | POA: Insufficient documentation

## 2013-02-06 DIAGNOSIS — J45909 Unspecified asthma, uncomplicated: Secondary | ICD-10-CM | POA: Insufficient documentation

## 2013-02-06 DIAGNOSIS — I10 Essential (primary) hypertension: Secondary | ICD-10-CM | POA: Insufficient documentation

## 2013-02-06 DIAGNOSIS — R110 Nausea: Secondary | ICD-10-CM | POA: Insufficient documentation

## 2013-02-06 LAB — CBC
HCT: 43.8 % (ref 39.0–52.0)
MCH: 32 pg (ref 26.0–34.0)
MCHC: 35.8 g/dL (ref 30.0–36.0)
MCV: 89.2 fL (ref 78.0–100.0)
Platelets: 193 10*3/uL (ref 150–400)
RBC: 4.91 MIL/uL (ref 4.22–5.81)
WBC: 8.3 10*3/uL (ref 4.0–10.5)

## 2013-02-06 NOTE — ED Provider Notes (Signed)
CSN: [id number]     Arrival date & time 02/06/13  2154 History   First MD Initiated Contact with Patient 02/06/13 2251     Chief Complaint  Patient presents with  . Loss of Consciousness  . Chest Pain   (Consider location/radiation/quality/duration/timing/severity/associated sxs/prior Treatment) HPI Comments: 27 yo male with HTN and asthma presents with cc of syncope.  Pt was eating at [employer] and became nauseated.  He slid to the edge of his chair and then hit head on bar and passed out.  He was unconscious for 1 minute.  Episode was witnessed by he fiance.  Hx obtained from pt and his fiance.  Pt has never passed out before.  No recent infections, trauma, of meds change.  PMH: HTN and Asthma PCP: [facility]  NKDA  Patient is a 27 y.o. male presenting with syncope and chest pain. The history is provided by the patient.  Loss of Consciousness Episode history:  Single Most recent episode:  Today Duration:  1 hour Timing:  Sporadic Progression:  Improving Chronicity:  New Context: standing up   Witnessed: yes   Relieved by:  Nothing Worsened by:  Nothing tried Ineffective treatments:  None tried Associated symptoms: chest pain   Associated symptoms: no anxiety, no confusion, no diaphoresis, no difficulty breathing, no dizziness, no fever, no focal sensory loss, no focal weakness, no headaches, no malaise/fatigue, no nausea, no palpitations, no recent fall, no recent injury, no recent surgery, no rectal bleeding, no seizures, no shortness of breath, no visual change, no vomiting and no weakness   Risk factors: no congenital heart disease, no coronary artery disease, no seizures and no vascular disease   Chest Pain Associated symptoms: syncope   Associated symptoms: no anxiety, no cough, no diaphoresis, no dizziness, no fever, no headache, no nausea, no palpitations, no shortness of breath, not vomiting and no weakness     Past Medical History  Diagnosis Date  . Asthma    . Hypertension    History reviewed. No pertinent past surgical history. No family history on file. History  Substance Use Topics  . Smoking status: Former Games developer  . Smokeless tobacco: Not on file  . Alcohol Use: No    Review of Systems  Constitutional: Negative for fever, malaise/fatigue, diaphoresis, activity change and appetite change.  HENT: Positive for dental problem. Negative for drooling, ear discharge, ear pain, facial swelling, hearing loss, mouth sores and nosebleeds.        L tooth cavity  Eyes: Negative.   Respiratory: Negative.  Negative for cough, choking, chest tightness and shortness of breath.   Cardiovascular: Positive for chest pain and syncope. Negative for palpitations.  Gastrointestinal: Negative for nausea and vomiting.  Endocrine: Negative.   Genitourinary: Negative.   Musculoskeletal: Negative.   Allergic/Immunologic: Negative.   Neurological: Negative for dizziness, focal weakness, seizures, weakness and headaches.  Hematological: Negative.   Psychiatric/Behavioral: Negative.  Negative for confusion.    Allergies  Review of patient's allergies indicates no known allergies.  Home Medications   Current Outpatient Rx  Name  Route  Sig  Dispense  Refill  . albuterol (PROVENTIL HFA;VENTOLIN HFA) 108 (90 BASE) MCG/ACT inhaler   Inhalation   Inhale 2 puffs into the lungs every 6 (six) hours as needed for wheezing.         . cyclobenzaprine (FLEXERIL) 10 MG tablet   Oral   Take 1 tablet (10 mg total) by mouth 3 (three) times daily as needed for muscle spasms.  20 tablet   0   . diphenhydrAMINE (BENADRYL) 25 mg capsule   Oral   Take 25 mg by mouth at bedtime as needed for itching. For allergies         . hydrochlorothiazide (HYDRODIURIL) 25 MG tablet   Oral   Take 25 mg by mouth daily.         Marland Kitchen ibuprofen (ADVIL,MOTRIN) 200 MG tablet   Oral   Take 200 mg by mouth every 6 (six) hours as needed for pain.          BP 127/68  Pulse 49   Temp(Src) 98.1 F (36.7 C) (Oral)  Resp 16  Ht 5\' 11"  (1.803 m)  Wt 241 lb (109.317 kg)  BMI 33.63 kg/m2  SpO2 99% Physical Exam  Vitals reviewed. Constitutional: He is oriented to person, place, and time. He appears well-developed and well-nourished.  HENT:  Head: Normocephalic and atraumatic.  Right Ear: External ear normal.  Left Ear: External ear normal.  Nose: Nose normal.  Mouth/Throat: Oropharynx is clear and moist.  Eyes: Conjunctivae and EOM are normal. Pupils are equal, round, and reactive to light.  Neck: Normal range of motion. Neck supple. No JVD present.  Cardiovascular: Normal rate.   Pulmonary/Chest: Effort normal and breath sounds normal. No stridor. He has no wheezes. He has no rales.  Abdominal: Soft. Bowel sounds are normal. He exhibits no distension and no mass. There is no tenderness. There is no rebound and no guarding.  Musculoskeletal: Normal range of motion. He exhibits no edema and no tenderness.  Neurological: He is alert and oriented to person, place, and time. He has normal reflexes. He displays normal reflexes. No cranial nerve deficit. He exhibits normal muscle tone. Coordination normal.  Reflex Scores:      Bicep reflexes are 2+ on the right side and 2+ on the left side.      Patellar reflexes are 2+ on the right side and 2+ on the left side. Nl f to n, nl ram  Psychiatric: He has a normal mood and affect.    ED Course  Procedures (including critical care time) Labs Review Labs Reviewed  COMPREHENSIVE METABOLIC PANEL - Abnormal; Notable for the following:    Potassium 3.1 (*)    Glucose, Bld 67 (*)    All other components within normal limits  URINE RAPID DRUG SCREEN (HOSP PERFORMED) - Abnormal; Notable for the following:    Tetrahydrocannabinol POSITIVE (*)    All other components within normal limits  TROPONIN I  URINALYSIS, DIPSTICK ONLY  CBC  GLUCOSE, CAPILLARY   Imaging Review Dg Chest 2 View  02/07/2013   CLINICAL DATA:  Loss  of consciousness.  EXAM: CHEST  2 VIEW  COMPARISON:  Chest radiograph performed 02/05/2011  FINDINGS: The lungs are well-aerated and clear. There is no evidence of focal opacification, pleural effusion or pneumothorax.  The heart is normal in size; the mediastinal contour is within normal limits. No acute osseous abnormalities are seen.  IMPRESSION: No acute cardiopulmonary process seen.   Electronically Signed   By: Roanna Raider M.D.   On: 02/07/2013 00:08   Ct Head Wo Contrast  02/07/2013   CLINICAL DATA:  Syncope; hit head.  EXAM: CT HEAD WITHOUT CONTRAST  TECHNIQUE: Contiguous axial images were obtained from the base of the skull through the vertex without intravenous contrast.  COMPARISON:  CT of the head performed 03/03/2009  FINDINGS: There is no evidence of acute infarction, mass lesion, or intra- or  extra-axial hemorrhage on CT.  Mild periventricular white matter change likely reflects small vessel ischemic microangiopathy. There is nonspecific increased attenuation with regard to visualized vasculature.  The posterior fossa, including the cerebellum, brainstem and fourth ventricle, is within normal limits. The third and lateral ventricles, and basal ganglia are unremarkable in appearance. The cerebral hemispheres are symmetric in appearance, with normal gray-white differentiation. No mass effect or midline shift is seen.  There is no evidence of fracture; visualized osseous structures are unremarkable in appearance. The orbits are within normal limits. The paranasal sinuses and mastoid air cells are well-aerated. No significant soft tissue abnormalities are seen.  IMPRESSION: 1. No evidence of traumatic intracranial injury or fracture. 2. Mild small vessel ischemic microangiopathy.   Electronically Signed   By: Roanna Raider M.D.   On: 02/07/2013 00:10    EKG Interpretation   None      Results for orders placed during the hospital encounter of 02/06/13  TROPONIN I      Result Value Range    Troponin I <0.30  <0.30 ng/mL  URINALYSIS, DIPSTICK ONLY      Result Value Range   Specific Gravity, Urine 1.016  1.005 - 1.030   pH 5.5  5.0 - 8.0   Glucose, UA NEGATIVE  NEGATIVE mg/dL   Hgb urine dipstick NEGATIVE  NEGATIVE   Bilirubin Urine NEGATIVE  NEGATIVE   Ketones, ur NEGATIVE  NEGATIVE mg/dL   Protein, ur NEGATIVE  NEGATIVE mg/dL   Urobilinogen, UA 0.2  0.0 - 1.0 mg/dL   Nitrite NEGATIVE  NEGATIVE   Leukocytes, UA NEGATIVE  NEGATIVE  COMPREHENSIVE METABOLIC PANEL      Result Value Range   Sodium 139  135 - 145 mEq/L   Potassium 3.1 (*) 3.5 - 5.1 mEq/L   Chloride 104  96 - 112 mEq/L   CO2 27  19 - 32 mEq/L   Glucose, Bld 67 (*) 70 - 99 mg/dL   BUN 17  6 - 23 mg/dL   Creatinine, Ser 1.61  0.50 - 1.35 mg/dL   Calcium 9.1  8.4 - 09.6 mg/dL   Total Protein 7.2  6.0 - 8.3 g/dL   Albumin 4.0  3.5 - 5.2 g/dL   AST 22  0 - 37 U/L   ALT 48  0 - 53 U/L   Alkaline Phosphatase 48  39 - 117 U/L   Total Bilirubin 0.4  0.3 - 1.2 mg/dL   GFR calc non Af Amer >90  >90 mL/min   GFR calc Af Amer >90  >90 mL/min  CBC      Result Value Range   WBC 8.3  4.0 - 10.5 K/uL   RBC 4.91  4.22 - 5.81 MIL/uL   Hemoglobin 15.7  13.0 - 17.0 g/dL   HCT 04.5  40.9 - 81.1 %   MCV 89.2  78.0 - 100.0 fL   MCH 32.0  26.0 - 34.0 pg   MCHC 35.8  30.0 - 36.0 g/dL   RDW 91.4  78.2 - 95.6 %   Platelets 193  150 - 400 K/uL  URINE RAPID DRUG SCREEN (HOSP PERFORMED)      Result Value Range   Opiates NONE DETECTED  NONE DETECTED   Cocaine NONE DETECTED  NONE DETECTED   Benzodiazepines NONE DETECTED  NONE DETECTED   Amphetamines NONE DETECTED  NONE DETECTED   Tetrahydrocannabinol POSITIVE (*) NONE DETECTED   Barbiturates NONE DETECTED  NONE DETECTED  GLUCOSE, CAPILLARY  Result Value Range   Glucose-Capillary 92  70 - 99 mg/dL    MDM   1. Syncope and collapse   2. Headache   3. Chest pain    27 year old male presents emergency department via EMS for syncope. Patient also had complaint of  mild chest pain and headache. ER workup included CBC, CMP, EKG, troponin, urinalysis, UDS, head CT.  Patient is low risk for chest pain. Single set EKG and troponin unremarkable. Chest x-ray negative  0449 Prolonged ER stay. Initial lab work revealed hypokalemia and hypoglycemia. Potassium and glucose were repleted. CT head revealed mild small vessel ischemic microangiopathy. Case discussed with neurology on call who recommended MRI.  Plan for MRI. If MRI shows abnormality consult neurology.  If MRI is normal discharge patient to home with PCP followup. Ativan given by mouth as patient is anxious regarding the MRI.  8:37 AM pt t/o to Dr. Oletta Lamas pending MRI.  Pt and Mother are aware of plan.    Darlys Gales, MD 02/07/13 [phone]

## 2013-02-06 NOTE — ED Notes (Signed)
Pt transported to Xray. 

## 2013-02-06 NOTE — ED Notes (Addendum)
Per EMS, pt was at a restaurant stood up and had a syncopal episode with LOC. Pt reported feeling his "heart racing" when he woke up.  Pt is having generalized weakness. CBG:149, HR: 72, SpO2: 98%, BP:153/93, RR; 18. Per friend, pt hit head on bar when he had syncopal episode.

## 2013-02-07 ENCOUNTER — Emergency Department (HOSPITAL_COMMUNITY): Payer: Self-pay

## 2013-02-07 LAB — RAPID URINE DRUG SCREEN, HOSP PERFORMED
Amphetamines: NOT DETECTED
Barbiturates: NOT DETECTED
Benzodiazepines: NOT DETECTED
Tetrahydrocannabinol: POSITIVE — AB

## 2013-02-07 LAB — COMPREHENSIVE METABOLIC PANEL
ALT: 48 U/L (ref 0–53)
AST: 22 U/L (ref 0–37)
Albumin: 4 g/dL (ref 3.5–5.2)
BUN: 17 mg/dL (ref 6–23)
Chloride: 104 mEq/L (ref 96–112)
Creatinine, Ser: 0.97 mg/dL (ref 0.50–1.35)
Potassium: 3.1 mEq/L — ABNORMAL LOW (ref 3.5–5.1)
Sodium: 139 mEq/L (ref 135–145)
Total Bilirubin: 0.4 mg/dL (ref 0.3–1.2)
Total Protein: 7.2 g/dL (ref 6.0–8.3)

## 2013-02-07 LAB — URINALYSIS, DIPSTICK ONLY
Bilirubin Urine: NEGATIVE
Leukocytes, UA: NEGATIVE
Nitrite: NEGATIVE
Specific Gravity, Urine: 1.016 (ref 1.005–1.030)
pH: 5.5 (ref 5.0–8.0)

## 2013-02-07 LAB — GLUCOSE, CAPILLARY
Glucose-Capillary: 92 mg/dL (ref 70–99)
Glucose-Capillary: 97 mg/dL (ref 70–99)

## 2013-02-07 LAB — TROPONIN I: Troponin I: <0.3 ng/mL (ref <0.30)

## 2013-02-07 MED ORDER — POTASSIUM CHLORIDE CRYS ER 20 MEQ PO TBCR
40.0000 meq | EXTENDED_RELEASE_TABLET | Freq: Once | ORAL | Status: AC
  Administered 2013-02-07: 40 meq via ORAL
  Filled 2013-02-07: qty 2

## 2013-02-07 MED ORDER — DEXTROSE 50 % IV SOLN
1.0000 | Freq: Once | INTRAVENOUS | Status: AC
  Administered 2013-02-07: 50 mL via INTRAVENOUS
  Filled 2013-02-07: qty 50

## 2013-02-07 MED ORDER — LORAZEPAM 1 MG PO TABS
1.0000 mg | ORAL_TABLET | Freq: Once | ORAL | Status: AC
  Administered 2013-02-07: 1 mg via ORAL
  Filled 2013-02-07: qty 1

## 2013-02-07 NOTE — ED Provider Notes (Signed)
MRI of brain negative.  Pt is safe to be discharged to home.  Referral made to [facility] for recheck in a few days as needed if no PCP.    Gavin Pound. Rochelle Larue, MD 02/07/13 1116

## 2013-02-07 NOTE — ED Notes (Signed)
Pt alert, NAD, speech clear, interactive, skin W&D, resps e/u,mildly anxious/restless, admits feeling that way, friend at [facility] agrees, requesting med to help relax for MRI, (denies: pain, sob, nausea or dizziness), family at [facility], VSS.

## 2013-02-07 NOTE — ED Notes (Signed)
Dr. Redgie Grayer, EDP at [facility] updating pt.

## 2013-02-07 NOTE — Discharge Instructions (Signed)
 Syncope Syncope is a fainting spell. This means the person loses consciousness and drops to the ground. The person is generally unconscious for less than 5 minutes. The person may have some muscle twitches for up to 15 seconds before waking up and returning to normal. Syncope occurs more often in elderly people, but it can happen to anyone. While most causes of syncope are not dangerous, syncope can be a sign of a serious medical problem. It is important to seek medical care.  CAUSES  Syncope is caused by a sudden decrease in blood flow to the brain. The specific cause is often not determined. Factors that can trigger syncope include:  Taking medicines that lower blood pressure.  Sudden changes in posture, such as standing up suddenly.  Taking more medicine than prescribed.  Standing in one place for too long.  Seizure disorders.  Dehydration and excessive exposure to heat.  Low blood sugar (hypoglycemia).  Straining to have a bowel movement.  Heart disease, irregular heartbeat, or other circulatory problems.  Fear, emotional distress, seeing blood, or severe pain. SYMPTOMS  Right before fainting, you may:  Feel dizzy or lightheaded.  Feel nauseous.  See all white or all black in your field of vision.  Have cold, clammy skin. DIAGNOSIS  Your caregiver will ask about your symptoms, perform a physical exam, and perform electrocardiography (ECG) to record the electrical activity of your heart. Your caregiver may also perform other heart or blood tests to determine the cause of your syncope. TREATMENT  In most cases, no treatment is needed. Depending on the cause of your syncope, your caregiver may recommend changing or stopping some of your medicines. HOME CARE INSTRUCTIONS  Have someone stay with you until you feel stable.  Do not drive, operate machinery, or play sports until your caregiver says it is okay.  Keep all follow-up appointments as directed by your  caregiver.  Lie down right away if you start feeling like you might faint. Breathe deeply and steadily. Wait until all the symptoms have passed.  Drink enough fluids to keep your urine clear or pale yellow.  If you are taking blood pressure or heart medicine, get up slowly, taking several minutes to sit and then stand. This can reduce dizziness. SEEK IMMEDIATE MEDICAL CARE IF:   You have a severe headache.  You have unusual pain in the chest, abdomen, or back.  You are bleeding from the mouth or rectum, or you have black or tarry stool.  You have an irregular or very fast heartbeat.  You have pain with breathing.  You have repeated fainting or seizure-like jerking during an episode.  You faint when sitting or lying down.  You have confusion.  You have difficulty walking.  You have severe weakness.  You have vision problems. If you fainted, call your local emergency services (911 in [place] Do not drive yourself to the hospital.  MAKE SURE YOU:  Understand these instructions.  Will watch your condition.  Will get help right away if you are not doing well or get worse. Document Released: 02/12/2005 Document Revised: 08/14/2011 Document Reviewed: 04/13/2011 [facility] Patient Information 2014 [place], [place].   Hypokalemia Hypokalemia means a low potassium level in the blood.Potassium is an electrolyte that helps regulate the amount of fluid in the body. It also stimulates muscle contraction and maintains a stable acid-base balance.Most of the body's potassium is inside of cells, and only a very small amount is in the blood. Because the amount in the blood is  so small, minor changes can have big effects. PREPARATION FOR TEST Testing for potassium requires taking a blood sample taken by needle from a vein in the arm. The skin is cleaned thoroughly before the sample is drawn. There is no other special preparation needed. NORMAL VALUES Potassium levels below 3.5 mEq/L are  abnormally low. Levels above 5.1 mEq/L are abnormally high. Ranges for normal findings may vary among different laboratories and hospitals. You should always check with your doctor after having lab work or other tests done to discuss the meaning of your test results and whether your values are considered within normal limits. MEANING OF TEST  Your caregiver will go over the test results with you and discuss the importance and meaning of your results, as well as treatment options and the need for additional tests, if necessary. A potassium level is frequently part of a routine medical exam. It is usually included as part of a whole panel of tests for several blood salts (such as Sodium and Chloride). It may be done as part of follow-up when a low potassium level was found in the past or other blood salts are suspected of being out of balance. A low potassium level might be suspected if you have one or more of the following:  Symptoms of weakness.  Abnormal heart rhythms.  High blood pressure and are taking medication to control this, especially water pills (diuretics).  Kidney disease that can affect your potassium level .  Diabetes requiring the use of insulin. The potassium may fall after taking insulin, especially if the diabetes had been out of control for a while.  A condition requiring the use of cortisone-type medication or certain types of antibiotics.  Vomiting and/or diarrhea for more than a day or two.  A stomach or intestinal condition that may not permit appropriate absorption of potassium.  Fainting episodes.  Mental confusion. OBTAINING TEST RESULTS It is your responsibility to obtain your test results. Ask the lab or department performing the test when and how you will get your results.  Please contact your caregiver directly if you have not received the results within one week. At that time, ask if there is anything different or new you should be doing in relation to  the results. TREATMENT Hypokalemia can be treated with potassium supplements taken by mouth and/or adjustments in your current medications. A diet high in potassium is also helpful. Foods with high potassium content are:  Peas, lentils, lima beans, nuts, and dried fruit.  Whole grain and bran cereals and breads.  Fresh fruit, vegetables (bananas, cantaloupe, grapefruit, oranges, tomatoes, honeydew melons, potatoes).  Orange and tomato juices.  Meats. If potassium supplement has been prescribed for you today or your medications have been adjusted, see your personal caregiver in time02 for a re-check. SEEK MEDICAL CARE IF:  There is a feeling of worsening weakness.  You experience repeated chest palpitations.  You are diabetic and having difficulty keeping your blood sugars in the normal range.  You are experiencing vomiting and/or diarrhea.  You are having difficulty with any of your regular medications. SEEK IMMEDIATE MEDICAL CARE IF:  You experience chest pain, shortness of breath, or episodes of dizziness.  You have been having vomiting or diarrhea for more than 2 days.  You have a fainting episode. MAKE SURE YOU:   Understand these instructions.  Will watch your condition.  Will get help right away if you are not doing well or get worse. Document Released: 02/12/2005 Document Revised: 05/07/2011  Document Reviewed: 08/15/2012 [facility] Patient Information 2014 [place], [place].

## 2013-02-10 ENCOUNTER — Encounter (HOSPITAL_COMMUNITY): Payer: Self-pay | Admitting: Emergency Medicine

## 2013-02-10 ENCOUNTER — Emergency Department (HOSPITAL_COMMUNITY)
Admission: EM | Admit: 2013-02-10 | Discharge: 2013-02-10 | Disposition: A | Discharge status: Home / Self Care | Payer: Self-pay | Attending: Emergency Medicine | Admitting: Emergency Medicine

## 2013-02-10 DIAGNOSIS — J45909 Unspecified asthma, uncomplicated: Secondary | ICD-10-CM | POA: Insufficient documentation

## 2013-02-10 DIAGNOSIS — Z87891 Personal history of nicotine dependence: Secondary | ICD-10-CM | POA: Insufficient documentation

## 2013-02-10 DIAGNOSIS — H9319 Tinnitus, unspecified ear: Secondary | ICD-10-CM | POA: Insufficient documentation

## 2013-02-10 DIAGNOSIS — H538 Other visual disturbances: Secondary | ICD-10-CM | POA: Insufficient documentation

## 2013-02-10 DIAGNOSIS — Z79899 Other long term (current) drug therapy: Secondary | ICD-10-CM | POA: Insufficient documentation

## 2013-02-10 DIAGNOSIS — F0781 Postconcussional syndrome: Secondary | ICD-10-CM | POA: Insufficient documentation

## 2013-02-10 DIAGNOSIS — I10 Essential (primary) hypertension: Secondary | ICD-10-CM | POA: Insufficient documentation

## 2013-02-10 LAB — BASIC METABOLIC PANEL
BUN: 13 mg/dL (ref 6–23)
CO2: 26 mEq/L (ref 19–32)
Calcium: 9.4 mg/dL (ref 8.4–10.5)
Chloride: 100 mEq/L (ref 96–112)
Creatinine, Ser: 0.79 mg/dL (ref 0.50–1.35)
GFR calc Af Amer: >90 mL/min (ref >90)
GFR calc non Af Amer: >90 mL/min (ref >90)
Glucose, Bld: 90 mg/dL (ref 70–99)
Potassium: 4.4 mEq/L (ref 3.5–5.1)
Sodium: 136 mEq/L (ref 135–145)

## 2013-02-10 LAB — CBC
HCT: 45.3 % (ref 39.0–52.0)
Hemoglobin: 16.3 g/dL (ref 13.0–17.0)
MCH: 31.6 pg (ref 26.0–34.0)
MCHC: 36 g/dL (ref 30.0–36.0)
MCV: 87.8 fL (ref 78.0–100.0)
Platelets: 202 10*3/uL (ref 150–400)
RBC: 5.16 MIL/uL (ref 4.22–5.81)
RDW: 13.3 % (ref 11.5–15.5)
WBC: 4.9 10*3/uL (ref 4.0–10.5)

## 2013-02-10 MED ORDER — SODIUM CHLORIDE 0.9 % IV BOLUS (SEPSIS)
1000.0000 mL | Freq: Once | INTRAVENOUS | Status: AC
  Administered 2013-02-10: 1000 mL via INTRAVENOUS

## 2013-02-10 MED ORDER — KETOROLAC TROMETHAMINE 30 MG/ML IJ SOLN
30.0000 mg | Freq: Once | INTRAMUSCULAR | Status: AC
  Administered 2013-02-10: 30 mg via INTRAVENOUS
  Filled 2013-02-10: qty 1

## 2013-02-10 MED ORDER — SODIUM CHLORIDE 0.9 % IV BOLUS (SEPSIS): 2000.0000 mL | Freq: Once | INTRAVENOUS | Status: DC

## 2013-02-10 MED ORDER — DIPHENHYDRAMINE HCL 50 MG/ML IJ SOLN
25.0000 mg | Freq: Once | INTRAMUSCULAR | Status: AC
  Administered 2013-02-10: 25 mg via INTRAVENOUS
  Filled 2013-02-10: qty 1

## 2013-02-10 MED ORDER — IBUPROFEN 800 MG PO TABS: 800.0000 mg | ORAL_TABLET | Freq: Three times a day (TID) | ORAL | Status: DC | PRN

## 2013-02-10 MED ORDER — PROCHLORPERAZINE EDISYLATE 5 MG/ML IJ SOLN
10.0000 mg | Freq: Once | INTRAMUSCULAR | Status: AC
  Administered 2013-02-10: 10 mg via INTRAVENOUS
  Filled 2013-02-10: qty 2

## 2013-02-10 NOTE — ED Notes (Signed)
Pt states that he passed out last Friday and hit his head on the wall.  C/o headaches since then.  Also has hx of migraines.

## 2013-02-10 NOTE — ED Provider Notes (Signed)
CSN: [id number]     Arrival date & time 02/10/13  1501 History   First MD Initiated Contact with Patient 02/10/13 1551     Chief Complaint  Patient presents with  . Headache   (Consider location/radiation/quality/duration/timing/severity/associated sxs/prior Treatment) HPI Walter Howell is a 27 year old male with a history of hypertension (on HCTZ) who presents to the ED today for left sided-headache x1 week. He reports that he was admitted to [facility] 02/06/2013 after a syncopal episode, during which he hit his head. He reports having low Potassium and Glucose levels at time of ED visit. MRI and CT were negative and he was discharged home, with referral to the [facility]ness center for follow-up Since the event, Walter Howell complains that he has had left-sided, constant throbbing headache. He has taken 800mg  of Ibuprofen everyday for the headache, which did not touch the pain. He has associated light sensitivity and mild, bilateral tinnitus. He complains of his left vision being "off", but doesn't describe it as blurred or double. He denies dysarthria, neck stiffness, fevers, weakness and numbness. He also denies nausea, vomiting, dizziness and additional syncopal episodes.  Past Medical History  Diagnosis Date  . Asthma   . Hypertension    No past surgical history on file. No family history on file. History  Substance Use Topics  . Smoking status: Former Games developer  . Smokeless tobacco: Not on file  . Alcohol Use: No    Review of Systems All other systems negative except as documented in the HPI. All pertinent positives and negatives as reviewed in the HPI.   Allergies  Review of patient's allergies indicates no known allergies.  Home Medications   Current Outpatient Rx  Name  Route  Sig  Dispense  Refill  . albuterol (PROVENTIL HFA;VENTOLIN HFA) 108 (90 BASE) MCG/ACT inhaler   Inhalation   Inhale 2 puffs into the lungs every 6 (six) hours as needed for wheezing.         . diphenhydrAMINE  (BENADRYL) 25 mg capsule   Oral   Take 25 mg by mouth at bedtime as needed for itching. For allergies         . hydrochlorothiazide (HYDRODIURIL) 25 MG tablet   Oral   Take 25 mg by mouth daily.         Marland Kitchen ibuprofen (ADVIL,MOTRIN) 200 MG tablet   Oral   Take 800 mg by mouth every 6 (six) hours as needed (pain).           BP 144/91  Pulse 60  Temp(Src) 98.1 F (36.7 C) (Oral)  Resp 16  SpO2 100% Physical Exam  Nursing note and vitals reviewed. Constitutional: He is oriented to person, place, and time. He appears well-developed and well-nourished.  HENT:  Head: Normocephalic and atraumatic.  Left Ear: External ear normal.  Mouth/Throat: Oropharynx is clear and moist.  Eyes: EOM are normal. Pupils are equal, round, and reactive to light.  Neck: Normal range of motion. Neck supple.  Cardiovascular: Normal rate, regular rhythm and normal heart sounds.   No murmur heard. Pulmonary/Chest: Effort normal and breath sounds normal. No respiratory distress.  Neurological: He is alert and oriented to person, place, and time. He has normal reflexes. He displays normal reflexes. No cranial nerve deficit. Coordination normal.  Skin: Skin is warm and dry.  Psychiatric: He has a normal mood and affect. His behavior is normal.    ED Course  Procedures (including critical care time)  1.  Headache  I have reviewed  past notes and MRI (negative) from last visit 02/06/2013. Based on negative MRI from 1 week ago and lack of neurological deficits on today's exam, he is safe to go home and follow up with [facility].   In the ED, we gave him Benadryl, Toradol, Compazine and fluids. I re-checked the patient at 5:05, after administering treatment, and his headache has greatly improved. He was resting comfortably when I left the room.  We will have him follow up with [facility]  Patient is advised of postconcussive syndrome, and that this is most likely the  cause of his residual headache.  The patient is feeling better still after the fluids and pain.  Medications given.  He is advised return here as needed.  Told him to followup with the [facility]    Jamesetta Orleans [place], [place] 02/10/13 [phone]

## 2013-02-10 NOTE — ED Provider Notes (Signed)
Medical screening examination/treatment/procedure(s) were performed by non-physician practitioner and as supervising physician I was immediately available for consultation/collaboration.  EKG Interpretation   None        Shon Baton, MD 02/10/13 2324

## 2013-03-12 ENCOUNTER — Ambulatory Visit: Payer: Self-pay | Admitting: Internal Medicine

## 2013-03-24 ENCOUNTER — Encounter (INDEPENDENT_AMBULATORY_CARE_PROVIDER_SITE_OTHER): Payer: Self-pay

## 2013-03-24 ENCOUNTER — Ambulatory Visit: Payer: Self-pay | Attending: Internal Medicine | Admitting: Internal Medicine

## 2013-03-24 VITALS — BP 164/103 | HR 72 | Temp 98.9°F | Resp 14 | Ht 71.0 in | Wt 249.4 lb

## 2013-03-24 DIAGNOSIS — E119 Type 2 diabetes mellitus without complications: Secondary | ICD-10-CM

## 2013-03-24 DIAGNOSIS — R51 Headache: Secondary | ICD-10-CM | POA: Insufficient documentation

## 2013-03-24 LAB — COMPLETE METABOLIC PANEL WITH GFR
ALT: 48 U/L (ref 0–53)
AST: 27 U/L (ref 0–37)
Albumin: 4.5 g/dL (ref 3.5–5.2)
Alkaline Phosphatase: 39 U/L (ref 39–117)
BUN: 15 mg/dL (ref 6–23)
CO2: 29 mEq/L (ref 19–32)
Calcium: 9.7 mg/dL (ref 8.4–10.5)
Chloride: 101 mEq/L (ref 96–112)
Creat: 0.93 mg/dL (ref 0.50–1.35)
GFR, Est African American: >89 mL/min
GFR, Est Non African American: >89 mL/min
Glucose, Bld: 94 mg/dL (ref 70–99)
Potassium: 4.1 mEq/L (ref 3.5–5.3)
Sodium: 140 mEq/L (ref 135–145)
Total Bilirubin: 0.6 mg/dL (ref 0.3–1.2)
Total Protein: 7.2 g/dL (ref 6.0–8.3)

## 2013-03-24 LAB — TSH: TSH: 3.623 u[IU]/mL (ref 0.350–4.500)

## 2013-03-24 LAB — POCT GLYCOSYLATED HEMOGLOBIN (HGB A1C): Hemoglobin A1C: 5.6

## 2013-03-24 MED ORDER — AMOXICILLIN-POT CLAVULANATE 875-125 MG PO TABS: 1.0000 | ORAL_TABLET | Freq: Two times a day (BID) | ORAL | Status: DC

## 2013-03-24 MED ORDER — HYDROCHLOROTHIAZIDE 25 MG PO TABS: 25.0000 mg | ORAL_TABLET | Freq: Every day | ORAL | Status: DC

## 2013-03-24 NOTE — Progress Notes (Signed)
Patient ID: Walter Howell, male   DOB: [birth year], 28 y.o.   MRN: [id number]   CC:  HPI: 28 year old male here to establish care, recently seen in the ED for hypertensive urgency and headache. The patient has had multiple ED visits for his headache. Described as being left-sided, he had a syncopal episode on 02/06/13 he had an MRI of the brain that was negative, he takes ibuprofen on a regular basis to treat his headaches. Blood pressure elevated today in the 160s He ran out of his hydrochlorothiazide yesterday, his urine drug screen was positive for marijuana He also complains of dental pain in his left premolar, also complains of blurry vision and needs a full eye exam  Social history patient admits to not smoking or doing any recreational drugs however urine drug screen was positive for marijuana, drinks alcohol socially, works as a Social worker currently unemployed  Family history maternal and paternal grandparents had heart disease, hypertension, diabetes   No Known Allergies Past Medical History  Diagnosis Date  . Asthma   . Hypertension   . Anxiety    Current Outpatient Prescriptions on File Prior to Visit  Medication Sig Dispense Refill  . albuterol (PROVENTIL HFA;VENTOLIN HFA) 108 (90 BASE) MCG/ACT inhaler Inhale 2 puffs into the lungs every 6 (six) hours as needed for wheezing.      . diphenhydrAMINE (BENADRYL) 25 mg capsule Take 25 mg by mouth at bedtime as needed for itching. For allergies       No current facility-administered medications on file prior to visit.   Family History  Problem Relation Age of Onset  . Diabetes Maternal Grandmother   . Hypertension Maternal Grandmother   . Diabetes Maternal Grandfather   . Diabetes Paternal Grandmother   . Heart disease Paternal Grandmother   . Diabetes Paternal Grandfather    History   Social History  . Marital Status: Single    Spouse Name: N/A    Number of Children: N/A  . Years of Education: N/A    Occupational History  . Not on file.   Social History Main Topics  . Smoking status: Former Games developer  . Smokeless tobacco: Not on file  . Alcohol Use: No  . Drug Use: No  . Sexual Activity: Not on file   Other Topics Concern  . Not on file   Social History Narrative  . No narrative on file    Review of Systems  Constitutional: As in history of present illness HENT: Negative for ear pain, nosebleeds, congestion, facial swelling, rhinorrhea, neck pain, neck stiffness and ear discharge.   Eyes: Negative for pain, discharge, redness, itching and visual disturbance.  Respiratory: Negative for cough, choking, chest tightness, shortness of breath, wheezing and stridor.   Cardiovascular: Negative for chest pain, palpitations and leg swelling.  Gastrointestinal: Negative for abdominal distention.  Genitourinary: Negative for dysuria, urgency, frequency, hematuria, flank pain, decreased urine volume, difficulty urinating and dyspareunia.  Musculoskeletal: Negative for back pain, joint swelling, arthralgias and gait problem.  Neurological: As in history of present illness Hematological: Negative for adenopathy. Does not bruise/bleed easily.  Psychiatric/Behavioral: Negative for hallucinations, behavioral problems, confusion, dysphoric mood, decreased concentration and agitation.    Objective:   Filed Vitals:   03/24/13 1514  BP: 164/103  Pulse: 72  Temp: 98.9 F (37.2 C)  Resp: 14    Physical Exam  Constitutional: Appears well-developed and well-nourished. No distress.  HENT: Normocephalic. External right and left ear normal. Oropharynx is clear and moist.  Eyes: Conjunctivae and EOM are normal. PERRLA, no scleral icterus.  Neck: Normal ROM. Neck supple. No JVD. No tracheal deviation. No thyromegaly.  CVS: RRR, S1/S2 +, no murmurs, no gallops, no carotid bruit.  Pulmonary: Effort and breath sounds normal, no stridor, rhonchi, wheezes, rales.  Abdominal: Soft. BS +,  no  distension, tenderness, rebound or guarding.  Musculoskeletal: Normal range of motion. No edema and no tenderness.  Lymphadenopathy: No lymphadenopathy noted, cervical, inguinal. Neuro: Alert. Normal reflexes, muscle tone coordination. No cranial nerve deficit. Skin: Skin is warm and dry. No rash noted. Not diaphoretic. No erythema. No pallor.  Psychiatric: Normal mood and affect. Behavior, judgment, thought content normal.   Lab Results  Component Value Date   WBC 4.9 02/10/2013   HGB 16.3 02/10/2013   HCT 45.3 02/10/2013   MCV 87.8 02/10/2013   PLT 202 02/10/2013   Lab Results  Component Value Date   CREATININE 0.79 02/10/2013   BUN 13 02/10/2013   NA 136 02/10/2013   K 4.4 02/10/2013   CL 100 02/10/2013   CO2 26 02/10/2013    Lab Results  Component Value Date   HGBA1C 5.6 03/24/2013   Lipid Panel  No results found for this basename: chol, trig, hdl, cholhdl, vldl, ldlcalc       Assessment and plan:   There are no active problems to display for this patient.  Headache Negative workup in December No gross neurologic deficits Probably related to uncontrolled hypertension Recommendations discontinue ibuprofen/Aleve/ Naprosyn Only take Tylenol as needed    If headaches persist, may need to be started on Topamax or calcium channel blocker   Blurry vision Ophthalmology referral has been provided  Dental pain Started on Augmentin, dentist referral provided   Follow up in one month for hypertension  The patient was given clear instructions to go to ER or return to medical center if symptoms don't improve, worsen or new problems develop. The patient verbalized understanding. The patient was told to call to get any lab results if not heard anything in the next week.

## 2013-03-24 NOTE — Progress Notes (Signed)
Pt is here to establish care. Complains of having anxiety attacks x1 year, hypertension, has a family history of diabetes and hypertension. Requests to be tested for diabetes.

## 2013-03-25 LAB — CORTISOL: CORTISOL PLASMA: 9.6 ug/dL

## 2013-03-27 LAB — ALDOSTERONE: ALDOSTERONE, SERUM: 1 ng/dL

## 2013-03-27 LAB — METANEPHRINES, PLASMA
METANEPHRINE FREE: 29 pg/mL (ref <=57)
Normetanephrine, Free: 88 pg/mL (ref <=148)
Total Metanephrines-Plasma: 117 pg/mL (ref <=205)

## 2013-03-29 ENCOUNTER — Encounter (HOSPITAL_COMMUNITY): Payer: Self-pay | Admitting: Emergency Medicine

## 2013-03-29 ENCOUNTER — Emergency Department (HOSPITAL_COMMUNITY)
Admission: EM | Admit: 2013-03-29 | Discharge: 2013-03-29 | Disposition: A | Discharge status: Home / Self Care | Payer: Self-pay | Attending: Emergency Medicine | Admitting: Emergency Medicine

## 2013-03-29 DIAGNOSIS — J45909 Unspecified asthma, uncomplicated: Secondary | ICD-10-CM | POA: Insufficient documentation

## 2013-03-29 DIAGNOSIS — R197 Diarrhea, unspecified: Secondary | ICD-10-CM | POA: Insufficient documentation

## 2013-03-29 DIAGNOSIS — I10 Essential (primary) hypertension: Secondary | ICD-10-CM | POA: Insufficient documentation

## 2013-03-29 DIAGNOSIS — Z87891 Personal history of nicotine dependence: Secondary | ICD-10-CM | POA: Insufficient documentation

## 2013-03-29 DIAGNOSIS — R6883 Chills (without fever): Secondary | ICD-10-CM | POA: Insufficient documentation

## 2013-03-29 DIAGNOSIS — J3489 Other specified disorders of nose and nasal sinuses: Secondary | ICD-10-CM | POA: Insufficient documentation

## 2013-03-29 DIAGNOSIS — IMO0001 Reserved for inherently not codable concepts without codable children: Secondary | ICD-10-CM | POA: Insufficient documentation

## 2013-03-29 DIAGNOSIS — R112 Nausea with vomiting, unspecified: Secondary | ICD-10-CM

## 2013-03-29 DIAGNOSIS — Z79899 Other long term (current) drug therapy: Secondary | ICD-10-CM | POA: Insufficient documentation

## 2013-03-29 DIAGNOSIS — Z8659 Personal history of other mental and behavioral disorders: Secondary | ICD-10-CM | POA: Insufficient documentation

## 2013-03-29 MED ORDER — ONDANSETRON 8 MG PO TBDP: 8.0000 mg | ORAL_TABLET | Freq: Once | ORAL | Status: DC

## 2013-03-29 MED ORDER — LOPERAMIDE HCL 2 MG PO CAPS: 2.0000 mg | ORAL_CAPSULE | ORAL | Status: DC | PRN

## 2013-03-29 MED ORDER — LOPERAMIDE HCL 2 MG PO CAPS: 2.0000 mg | ORAL_CAPSULE | Freq: Four times a day (QID) | ORAL | Status: DC | PRN

## 2013-03-29 MED ORDER — PROMETHAZINE HCL 25 MG PO TABS: 25.0000 mg | ORAL_TABLET | Freq: Four times a day (QID) | ORAL | Status: DC | PRN

## 2013-03-29 NOTE — ED Notes (Signed)
Pt from home reports nasal congestion, nausea, diarrhea x4 days. Pt reports that he can keep ginger ale down, but unable to eat. Pt is A&O and in NAD

## 2013-03-29 NOTE — Discharge Instructions (Signed)
Diarrhea °Diarrhea is frequent loose and watery bowel movements. It can cause you to feel weak and dehydrated. Dehydration can cause you to become tired and thirsty, have a dry mouth, and have decreased urination that often is dark yellow. Diarrhea is a sign of another problem, most often an infection that will not last long. In most cases, diarrhea typically lasts 2 3 days. However, it can last longer if it is a sign of something more serious. It is important to treat your diarrhea as directed by your caregive to lessen or prevent future episodes of diarrhea. °CAUSES  °Some common causes include: °· Gastrointestinal infections caused by viruses, bacteria, or parasites. °· Food poisoning or food allergies. °· Certain medicines, such as antibiotics, chemotherapy, and laxatives. °· Artificial sweeteners and fructose. °· Digestive disorders. °HOME CARE INSTRUCTIONS °· Ensure adequate fluid intake (hydration): have 1 cup (8 oz) of fluid for each diarrhea episode. Avoid fluids that contain simple sugars or sports drinks, fruit juices, whole milk products, and sodas. Your urine should be clear or pale yellow if you are drinking enough fluids. Hydrate with an oral rehydration solution that you can purchase at pharmacies, retail stores, and online. You can prepare an oral rehydration solution at home by mixing the following ingredients together: °·   tsp table salt. °· ¾ tsp baking soda. °·  tsp salt substitute containing potassium chloride. °· 1  tablespoons sugar. °· 1 L (34 oz) of water. °· Certain foods and beverages may increase the speed at which food moves through the gastrointestinal (GI) tract. These foods and beverages should be avoided and include: °· Caffeinated and alcoholic beverages. °· High-fiber foods, such as raw fruits and vegetables, nuts, seeds, and whole grain breads and cereals. °· Foods and beverages sweetened with sugar alcohols, such as xylitol, sorbitol, and mannitol. °· Some foods may be well  tolerated and may help thicken stool including: °· Starchy foods, such as rice, toast, pasta, low-sugar cereal, oatmeal, grits, baked potatoes, crackers, and bagels. °· Bananas. °· Applesauce. °· Add probiotic-rich foods to help increase healthy bacteria in the GI tract, such as yogurt and fermented milk products. °· Wash your hands well after each diarrhea episode. °· Only take over-the-counter or prescription medicines as directed by your caregiver. °· Take a warm bath to relieve any burning or pain from frequent diarrhea episodes. °SEEK IMMEDIATE MEDICAL CARE IF:  °· You are unable to keep fluids down. °· You have persistent vomiting. °· You have blood in your stool, or your stools are black and tarry. °· You do not urinate in 6 8 hours, or there is only a small amount of very dark urine. °· You have abdominal pain that increases or localizes. °· You have weakness, dizziness, confusion, or lightheadedness. °· You have a severe headache. °· Your diarrhea gets worse or does not get better. °· You have a fever or persistent symptoms for more than 2 3 days. °· You have a fever and your symptoms suddenly get worse. °MAKE SURE YOU:  °· Understand these instructions. °· Will watch your condition. °· Will get help right away if you are not doing well or get worse. °Document Released: 02/02/2002 Document Revised: 01/30/2012 Document Reviewed: 10/21/2011 °ExitCare® Patient Information ©2014 [employer]. ° °Nausea and Vomiting °Nausea is a sick feeling that often comes before throwing up (vomiting). Vomiting is a reflex where stomach contents come out of your mouth. Vomiting can cause severe loss of body fluids (dehydration). Children and elderly adults can become   dehydrated quickly, especially if they also have diarrhea. Nausea and vomiting are symptoms of a condition or disease. It is important to find the cause of your symptoms. °CAUSES  °· Direct irritation of the stomach lining. This irritation can result from  increased acid production (gastroesophageal reflux disease), infection, food poisoning, taking certain medicines (such as nonsteroidal anti-inflammatory drugs), alcohol use, or tobacco use. °· Signals from the brain. These signals could be caused by a headache, heat exposure, an inner ear disturbance, increased pressure in the brain from injury, infection, a tumor, or a concussion, pain, emotional stimulus, or metabolic problems. °· An obstruction in the gastrointestinal tract (bowel obstruction). °· Illnesses such as diabetes, hepatitis, gallbladder problems, appendicitis, kidney problems, cancer, sepsis, atypical symptoms of a heart attack, or eating disorders. °· Medical treatments such as chemotherapy and radiation. °· Receiving medicine that makes you sleep (general anesthetic) during surgery. °DIAGNOSIS °Your caregiver may ask for tests to be done if the problems do not improve after a few days. Tests may also be done if symptoms are severe or if the reason for the nausea and vomiting is not clear. Tests may include: °· Urine tests. °· Blood tests. °· Stool tests. °· Cultures (to look for evidence of infection). °· X-rays or other imaging studies. °Test results can help your caregiver make decisions about treatment or the need for additional tests. °TREATMENT °You need to stay well hydrated. Drink frequently but in small amounts. You may wish to drink water, sports drinks, clear broth, or eat frozen ice pops or gelatin dessert to help stay hydrated. When you eat, eating slowly may help prevent nausea. There are also some antinausea medicines that may help prevent nausea. °HOME CARE INSTRUCTIONS  °· Take all medicine as directed by your caregiver. °· If you do not have an appetite, do not force yourself to eat. However, you must continue to drink fluids. °· If you have an appetite, eat a normal diet unless your caregiver tells you differently. °· Eat a variety of complex carbohydrates (rice, wheat, potatoes,  bread), lean meats, yogurt, fruits, and vegetables. °· Avoid high-fat foods because they are more difficult to digest. °· Drink enough water and fluids to keep your urine clear or pale yellow. °· If you are dehydrated, ask your caregiver for specific rehydration instructions. Signs of dehydration may include: °· Severe thirst. °· Dry lips and mouth. °· Dizziness. °· Dark urine. °· Decreasing urine frequency and amount. °· Confusion. °· Rapid breathing or pulse. °SEEK IMMEDIATE MEDICAL CARE IF:  °· You have blood or brown flecks (like coffee grounds) in your vomit. °· You have black or bloody stools. °· You have a severe headache or stiff neck. °· You are confused. °· You have severe abdominal pain. °· You have chest pain or trouble breathing. °· You do not urinate at least once every 8 hours. °· You develop cold or clammy skin. °· You continue to vomit for longer than 24 to 48 hours. °· You have a fever. °MAKE SURE YOU:  °· Understand these instructions. °· Will watch your condition. °· Will get help right away if you are not doing well or get worse. °Document Released: 02/12/2005 Document Revised: 05/07/2011 Document Reviewed: 07/12/2010 °ExitCare® Patient Information ©2014 [employer]. ° °

## 2013-03-29 NOTE — ED Provider Notes (Signed)
CSN: [id number]     Arrival date & time 03/29/13  9562 History   First MD Initiated Contact with Patient 03/29/13 1010     Chief Complaint  Patient presents with  . Nasal Congestion  . Diarrhea  . Nausea   (Consider location/radiation/quality/duration/timing/severity/associated sxs/prior Treatment) HPI Comments: Pt had just been seen at [facility] on 1/27 to establish as a new patient.  He did not mention nasal congestion nor nausea, vomiting and diarrhea.  I asked why not, and he states it was mild, and didn't think it was concerning.  However it was worse and he requests a work note for today as he did not go.  He vomited twice yesterday, has kept down liquids today.  Pt was prescribed augmentin for his toothache, but he reports he did not fill it yet because he didn't have enough money right now after paying his rent.    Patient is a 28 y.o. male presenting with diarrhea. The history is provided by the patient and medical records.  Diarrhea Quality:  Semi-solid Severity:  Moderate Onset quality:  Gradual Number of episodes:  4 in 1 day Duration:  2 weeks Timing:  Intermittent Progression:  Worsening Associated symptoms: chills, myalgias and vomiting   Associated symptoms: no abdominal pain and no recent cough   Risk factors: no recent antibiotic use, no suspicious food intake and no travel to endemic areas     Past Medical History  Diagnosis Date  . Asthma   . Hypertension   . Anxiety    History reviewed. No pertinent past surgical history. Family History  Problem Relation Age of Onset  . Diabetes Maternal Grandmother   . Hypertension Maternal Grandmother   . Diabetes Maternal Grandfather   . Diabetes Paternal Grandmother   . Heart disease Paternal Grandmother   . Diabetes Paternal Grandfather    History  Substance Use Topics  . Smoking status: Former Games developer  . Smokeless tobacco: Not on file  . Alcohol Use: No    Review of Systems  Constitutional:  Positive for chills and appetite change.  Gastrointestinal: Positive for nausea, vomiting and diarrhea. Negative for abdominal pain.  Musculoskeletal: Positive for myalgias.  Skin: Negative for rash.    Allergies  Review of patient's allergies indicates no known allergies.  Home Medications   Current Outpatient Rx  Name  Route  Sig  Dispense  Refill  . acetaminophen (TYLENOL) 500 MG tablet   Oral   Take 1,000 mg by mouth every 6 (six) hours as needed for headache.         . albuterol (PROVENTIL HFA;VENTOLIN HFA) 108 (90 BASE) MCG/ACT inhaler   Inhalation   Inhale 2 puffs into the lungs every 6 (six) hours as needed for wheezing.         . diphenhydrAMINE (BENADRYL) 25 mg capsule   Oral   Take 25 mg by mouth at bedtime as needed for itching. For allergies         . hydrochlorothiazide (HYDRODIURIL) 25 MG tablet   Oral   Take 1 tablet (25 mg total) by mouth daily.   30 tablet   2    BP 137/85  Pulse 88  Temp(Src) 97.8 F (36.6 C) (Oral)  Resp 20  SpO2 97% Physical Exam  Nursing note and vitals reviewed. Constitutional: He is oriented to person, place, and time. He appears well-developed and well-nourished.  Non-toxic appearance. He does not have a sickly appearance. He does not appear ill. No distress.  HENT:  Head: Normocephalic and atraumatic.  Right Ear: Tympanic membrane and ear canal normal.  Left Ear: Tympanic membrane and ear canal normal.  Mouth/Throat: Oropharynx is clear and moist.  Eyes: Conjunctivae are normal. No scleral icterus.  Neck: Normal range of motion. Neck supple.  Cardiovascular: Normal rate, regular rhythm and intact distal pulses.   Pulmonary/Chest: Effort normal. No respiratory distress. He has no wheezes.  Abdominal: Soft. He exhibits no distension. There is no tenderness. There is no rebound and no guarding.  Neurological: He is alert and oriented to person, place, and time. He exhibits normal muscle tone. Coordination normal.  Skin:  Skin is warm. He is not diaphoretic.  Psychiatric: He has a normal mood and affect.    ED Course  Procedures (including critical care time) Labs Review Labs Reviewed - No data to display Imaging Review No results found.  EKG Interpretation   None      RA sat is 97% and I interpret to be normal MDM   1. Nausea vomiting and diarrhea      Pt withi report of what sounds like mild viral illness.  No emesis here, has been keeping fluids down at home. No diarrhea since yesterday . No obv coughing, lungs clear, no sig nasal discharge.  Pt has not followed all recommendations given to him by PCP at [facility] . Pt asks for work note.  Will d/c home with Rx for nausea and diarrhea.      Gavin PoundMichael Y. Walter Min, MD 03/29/13 1052

## 2013-04-01 ENCOUNTER — Telehealth: Payer: Self-pay | Admitting: *Deleted

## 2013-04-01 NOTE — Telephone Encounter (Signed)
Left a voicemail for pt to give us a call back. 

## 2013-04-01 NOTE — Telephone Encounter (Signed)
Message copied by Renlee Floor, [place] on Wed Apr 01, 2013  3:32 PM ------      Message from: Susie CassetteABROL MD, [facility]YANA      Created: Tue Mar 31, 2013  3:34 PM       Notify patient of the labs are normal ------

## 2013-04-29 ENCOUNTER — Ambulatory Visit: Payer: Self-pay | Admitting: Internal Medicine

## 2013-05-22 ENCOUNTER — Ambulatory Visit: Payer: Self-pay | Admitting: Internal Medicine

## 2013-06-24 ENCOUNTER — Emergency Department (HOSPITAL_COMMUNITY)
Admission: EM | Admit: 2013-06-24 | Discharge: 2013-06-25 | Disposition: A | Discharge status: Home / Self Care | Payer: Self-pay | Attending: Emergency Medicine | Admitting: Emergency Medicine

## 2013-06-24 ENCOUNTER — Encounter (HOSPITAL_COMMUNITY): Payer: Self-pay | Admitting: Emergency Medicine

## 2013-06-24 ENCOUNTER — Emergency Department (HOSPITAL_COMMUNITY): Admission: EM | Admit: 2013-06-24 | Discharge: 2013-06-24 | Discharge status: Against Medical Advice | Payer: Self-pay

## 2013-06-24 DIAGNOSIS — R51 Headache: Secondary | ICD-10-CM | POA: Insufficient documentation

## 2013-06-24 DIAGNOSIS — R42 Dizziness and giddiness: Secondary | ICD-10-CM | POA: Insufficient documentation

## 2013-06-24 DIAGNOSIS — J45909 Unspecified asthma, uncomplicated: Secondary | ICD-10-CM | POA: Insufficient documentation

## 2013-06-24 DIAGNOSIS — Z79899 Other long term (current) drug therapy: Secondary | ICD-10-CM | POA: Insufficient documentation

## 2013-06-24 DIAGNOSIS — Z87891 Personal history of nicotine dependence: Secondary | ICD-10-CM | POA: Insufficient documentation

## 2013-06-24 DIAGNOSIS — R0789 Other chest pain: Secondary | ICD-10-CM | POA: Insufficient documentation

## 2013-06-24 DIAGNOSIS — Z8719 Personal history of other diseases of the digestive system: Secondary | ICD-10-CM | POA: Insufficient documentation

## 2013-06-24 DIAGNOSIS — H53149 Visual discomfort, unspecified: Secondary | ICD-10-CM | POA: Insufficient documentation

## 2013-06-24 DIAGNOSIS — I10 Essential (primary) hypertension: Secondary | ICD-10-CM | POA: Insufficient documentation

## 2013-06-24 DIAGNOSIS — R519 Headache, unspecified: Secondary | ICD-10-CM

## 2013-06-24 DIAGNOSIS — Z8659 Personal history of other mental and behavioral disorders: Secondary | ICD-10-CM | POA: Insufficient documentation

## 2013-06-24 NOTE — ED Notes (Signed)
Pt presents with c/o migraine headache he states he has had for the past two weeks and chest pain that started today around 1245

## 2013-06-25 ENCOUNTER — Emergency Department (HOSPITAL_COMMUNITY): Payer: Self-pay

## 2013-06-25 MED ORDER — PROMETHAZINE HCL 25 MG PO TABS: 25.0000 mg | ORAL_TABLET | Freq: Four times a day (QID) | ORAL | Status: DC | PRN

## 2013-06-25 MED ORDER — METOCLOPRAMIDE HCL 5 MG/ML IJ SOLN
10.0000 mg | Freq: Once | INTRAMUSCULAR | Status: DC
  Filled 2013-06-25: qty 2

## 2013-06-25 NOTE — ED Provider Notes (Signed)
CSN: [id number]     Arrival date & time 06/24/13  2349 History   First MD Initiated Contact with Patient 06/25/13 0023     Chief Complaint  Patient presents with  . Chest Pain  . Migraine     (Consider location/radiation/quality/duration/timing/severity/associated sxs/prior Treatment) HPI History provided by pt.   Pt presents w/ multiple complaints.  Has had intermittent, non-radiating, sharp/tight pain in center of chest since 1pm yesterday.  Onset at rest. Non-positional and non-exertional but aggravated by coughing and deep inspiration.  Denies fever, cough, SOB, wheezing, abd pain, LE edema/pain.  Has never had these sx in the past.  Has acid reflux daily and current pain different. Denies trauma.  Pertinent PMH includes asthma, HTN, anxiety.  Quit smoking 4 months ago.  Also c/o constant L temporal headache for the past week.  Associated w/ intermittent dizziness and mild photophobia..  Denies vision changes, extremity weakness/paresthesias, N/V.  Denies trauma.  Has never had pain like this before.  Mild relief w/ ibuprofen/tylenol.  Past Medical History  Diagnosis Date  . Asthma   . Hypertension   . Anxiety    History reviewed. No pertinent past surgical history. Family History  Problem Relation Age of Onset  . Diabetes Maternal Grandmother   . Hypertension Maternal Grandmother   . Diabetes Maternal Grandfather   . Diabetes Paternal Grandmother   . Heart disease Paternal Grandmother   . Diabetes Paternal Grandfather    History  Substance Use Topics  . Smoking status: Former Games developermoker  . Smokeless tobacco: Not on file  . Alcohol Use: No    Review of Systems  All other systems reviewed and are negative.     Allergies  Review of patient's allergies indicates no known allergies.  Home Medications   Prior to Admission medications   Medication Sig Start Date End Date Taking? Authorizing Provider  acetaminophen (TYLENOL) 500 MG tablet Take 1,000 mg by mouth every 6 (six)  hours as needed for headache.   Yes Historical Provider, MD  diphenhydrAMINE (BENADRYL) 25 mg capsule Take 25 mg by mouth at bedtime as needed for itching. For allergies   Yes Historical Provider, MD  hydrochlorothiazide (HYDRODIURIL) 25 MG tablet Take 1 tablet (25 mg total) by mouth daily. 03/24/13  Yes Richarda OverlieNayana Abrol, MD  ibuprofen (ADVIL,MOTRIN) 200 MG tablet Take 800 mg by mouth every 6 (six) hours as needed for moderate pain.   Yes Historical Provider, MD  loratadine (CLARITIN) 10 MG tablet Take 10 mg by mouth daily as needed for allergies.   Yes Historical Provider, MD  albuterol (PROVENTIL HFA;VENTOLIN HFA) 108 (90 BASE) MCG/ACT inhaler Inhale 2 puffs into the lungs every 6 (six) hours as needed for wheezing.    Historical Provider, MD   BP 155/77  Temp(Src) 98.3 F (36.8 C) (Oral)  Resp 20  Ht 5\' 11"  (1.803 m)  Wt 245 lb (111.131 kg)  BMI 34.19 kg/m2  SpO2 99% Physical Exam  Nursing note and vitals reviewed. Constitutional: He is oriented to person, place, and time. He appears well-developed and well-nourished. No distress.  HENT:  Head: Normocephalic and atraumatic.  Eyes:  Normal appearance  Neck: Normal range of motion.  No meningismus  Cardiovascular: Normal rate, regular rhythm and intact distal pulses.   Pulmonary/Chest: Effort normal and breath sounds normal. No respiratory distress.  Musculoskeletal: Normal range of motion.  No LE edema or calf ttp  Neurological: He is alert and oriented to person, place, and time. No sensory deficit. Coordination normal.  CN 3-12 intact.  No nystagmus. 5/5 and equal upper and lower extremity strength.  No past pointing.     Skin: Skin is warm and dry. No rash noted.  Psychiatric: He has a normal mood and affect. His behavior is normal.    ED Course  Procedures (including critical care time) Labs Review Labs Reviewed - No data to display  Imaging Review Dg Chest 2 View  06/25/2013   CLINICAL DATA:  Two week history of chest pain.   EXAM: CHEST  2 VIEW  COMPARISON:  DG CHEST 2 VIEW dated 02/06/2013  FINDINGS: The lungs are adequately inflated. There is no focal infiltrate. The cardiac silhouette is normal in size. The pulmonary vascularity is not engorged. The mediastinum is normal in width. There is no pleural effusion. The observed portions of the bony thorax appear normal.  IMPRESSION: There is no evidence of active cardiopulmonary disease.   Electronically Signed   By: David  [place]   On: 06/25/2013 01:31     EKG Interpretation None      MDM   Final diagnoses:  Headache  Musculoskeletal chest pain    28yo M w/ GERD, asthma, HTN and anxiety presents w/ multiple complaints.  Has had atypical CP since yesterday afternoon.   Pain reproducible on exam and is likely musculoskeletal.  Reportedly feels different than GERD, which he experiences on daily basis.  No RF for or exam findings concerning for PE.  No wheezing or coughing to suggest asthma exacerbation.  EKG non-ischemic.  CXR pending.  Also c/o non-traumatic L-sided headache x 1 week.  Pain constant and severe and no prior h/o same.  Afebrile, non-toxic and comfortable appearing, no focal neuro deficits or meningeal signs on exam.  Will treat w/ IM reglan and reassess.   1:28 AM   Pt declined reglan because he has a 30min drive and was afraid of SE profile.  Will prescribe phenergan to be taken with benadryl to trial for headache pain at home.  Referred to neuro for persistent sx and advised return for worsening pain and/or new neurologic sx.  CXR negative.  Results discussed w/ pt.  Recommended tylenol/motrin prn as well as ice or heat and avoidance of aggravating activities.  Return precautions discussed.    Otilio Miuatherine E Carman Auxier, PA-C 06/25/13 [phone]

## 2013-06-25 NOTE — ED Provider Notes (Signed)
Medical screening examination/treatment/procedure(s) were performed by non-physician practitioner and as supervising physician I was immediately available for consultation/collaboration.   EKG Interpretation None       Galena Logie M Zamar Odwyer, MD 06/25/13 0506 

## 2013-06-25 NOTE — Discharge Instructions (Signed)
Take phenergan with benadryl as needed for headache pain.   Take tylenol or ibuprofen as needed for pain in your chest.  Be aware that ibuprofen use may worsen your acid reflux, so take with food.  You can try an ice pack on your chest as well.  Avoid activities that aggravate pain.   Follow up with the neurologist you have been referred to if your headache pain has not resolved by next week.  Return to the ER if your headache worsens or becomes associated w/ fever, vision changes, loss of balance, numbness/weakness of extremities, confusion.  You should also return for worsening chest pain or difficulty breathing.

## 2013-07-08 ENCOUNTER — Encounter: Payer: Self-pay | Admitting: Internal Medicine

## 2013-07-08 ENCOUNTER — Ambulatory Visit: Payer: Self-pay | Attending: Internal Medicine | Admitting: Internal Medicine

## 2013-07-08 VITALS — BP 130/90 | HR 80 | Temp 98.4°F | Resp 16 | Wt 252.0 lb

## 2013-07-08 DIAGNOSIS — I10 Essential (primary) hypertension: Secondary | ICD-10-CM | POA: Insufficient documentation

## 2013-07-08 DIAGNOSIS — K0889 Other specified disorders of teeth and supporting structures: Secondary | ICD-10-CM

## 2013-07-08 DIAGNOSIS — Z79899 Other long term (current) drug therapy: Secondary | ICD-10-CM | POA: Insufficient documentation

## 2013-07-08 DIAGNOSIS — K029 Dental caries, unspecified: Secondary | ICD-10-CM | POA: Insufficient documentation

## 2013-07-08 DIAGNOSIS — Z87891 Personal history of nicotine dependence: Secondary | ICD-10-CM | POA: Insufficient documentation

## 2013-07-08 DIAGNOSIS — J45909 Unspecified asthma, uncomplicated: Secondary | ICD-10-CM | POA: Insufficient documentation

## 2013-07-08 DIAGNOSIS — Z113 Encounter for screening for infections with a predominantly sexual mode of transmission: Secondary | ICD-10-CM

## 2013-07-08 DIAGNOSIS — K089 Disorder of teeth and supporting structures, unspecified: Secondary | ICD-10-CM

## 2013-07-08 LAB — COMPLETE METABOLIC PANEL WITH GFR
ALBUMIN: 4.5 g/dL (ref 3.5–5.2)
ALT: 57 U/L — AB (ref 0–53)
AST: 30 U/L (ref 0–37)
Alkaline Phosphatase: 34 U/L — ABNORMAL LOW (ref 39–117)
BUN: 15 mg/dL (ref 6–23)
CALCIUM: 9.6 mg/dL (ref 8.4–10.5)
CHLORIDE: 100 meq/L (ref 96–112)
CO2: 27 meq/L (ref 19–32)
Creat: 1.09 mg/dL (ref 0.50–1.35)
GFR, Est Non African American: >89 mL/min
Glucose, Bld: 113 mg/dL — ABNORMAL HIGH (ref 70–99)
POTASSIUM: 3.8 meq/L (ref 3.5–5.3)
SODIUM: 137 meq/L (ref 135–145)
Total Bilirubin: 0.7 mg/dL (ref 0.2–1.2)
Total Protein: 7.1 g/dL (ref 6.0–8.3)

## 2013-07-08 MED ORDER — AMOXICILLIN-POT CLAVULANATE 875-125 MG PO TABS: 1.0000 | ORAL_TABLET | Freq: Two times a day (BID) | ORAL | Status: DC

## 2013-07-08 MED ORDER — TRAMADOL HCL 50 MG PO TABS: 50.0000 mg | ORAL_TABLET | Freq: Three times a day (TID) | ORAL | Status: DC | PRN

## 2013-07-08 NOTE — Patient Instructions (Signed)
DASH Diet  The DASH diet stands for "Dietary Approaches to Stop Hypertension." It is a healthy eating plan that has been shown to reduce high blood pressure (hypertension) in as little as 14 days, while also possibly providing other significant health benefits. These other health benefits include reducing the risk of breast cancer after menopause and reducing the risk of type 2 diabetes, heart disease, colon cancer, and stroke. Health benefits also include weight loss and slowing kidney failure in patients with chronic kidney disease.   DIET GUIDELINES  · Limit salt (sodium). Your diet should contain less than 1500 mg of sodium daily.  · Limit refined or processed carbohydrates. Your diet should include mostly whole grains. Desserts and added sugars should be used sparingly.  · Include small amounts of heart-healthy fats. These types of fats include nuts, oils, and tub margarine. Limit saturated and trans fats. These fats have been shown to be harmful in the body.  CHOOSING FOODS   The following food groups are based on a 2000 calorie diet. See your Registered Dietitian for individual calorie needs.  Grains and Grain Products (6 to 8 servings daily)  · Eat More Often: Whole-wheat bread, brown rice, whole-grain or wheat pasta, quinoa, popcorn without added fat or salt (air popped).  · Eat Less Often: White bread, white pasta, white rice, cornbread.  Vegetables (4 to 5 servings daily)  · Eat More Often: Fresh, frozen, and canned vegetables. Vegetables may be raw, steamed, roasted, or grilled with a minimal amount of fat.  · Eat Less Often/Avoid: Creamed or fried vegetables. Vegetables in a cheese sauce.  Fruit (4 to 5 servings daily)  · Eat More Often: All fresh, canned (in natural juice), or frozen fruits. Dried fruits without added sugar. One hundred percent fruit juice (½ cup [237 mL] daily).  · Eat Less Often: Dried fruits with added sugar. Canned fruit in light or heavy syrup.  Lean Meats, Fish, and Poultry (2  servings or less daily. One serving is 3 to 4 oz [85-114 g]).  · Eat More Often: Ninety percent or leaner ground beef, tenderloin, sirloin. Round cuts of beef, chicken breast, turkey breast. All fish. Grill, bake, or broil your meat. Nothing should be fried.  · Eat Less Often/Avoid: Fatty cuts of meat, turkey, or chicken leg, thigh, or wing. Fried cuts of meat or fish.  Dairy (2 to 3 servings)  · Eat More Often: Low-fat or fat-free milk, low-fat plain or light yogurt, reduced-fat or part-skim cheese.  · Eat Less Often/Avoid: Milk (whole, 2%). Whole milk yogurt. Full-fat cheeses.  Nuts, Seeds, and Legumes (4 to 5 servings per week)  · Eat More Often: All without added salt.  · Eat Less Often/Avoid: Salted nuts and seeds, canned beans with added salt.  Fats and Sweets (limited)  · Eat More Often: Vegetable oils, tub margarines without trans fats, sugar-free gelatin. Mayonnaise and salad dressings.  · Eat Less Often/Avoid: Coconut oils, palm oils, butter, stick margarine, cream, half and half, cookies, candy, pie.  FOR MORE INFORMATION  The Dash Diet Eating Plan: www.dashdiet.org  Document Released: 02/01/2011 Document Revised: 05/07/2011 Document Reviewed: 02/01/2011  [employer]® Patient Information ©2014 [employer].

## 2013-07-08 NOTE — Progress Notes (Signed)
Patient complains he has a bad tooth on the left lower side of his mouth When it flares up , gives him a real bad headache Was told by the ED to follow up with his primary doctor Would also liked to be tested for STD's

## 2013-07-08 NOTE — Progress Notes (Signed)
MRN: [id number] Name: Walter Howell  Sex: male Age: 28 y.o. DOB: 11-18-1985  Allergies: Review of patient's allergies indicates no known allergies.  Chief Complaint  Patient presents with  . Dental Pain    HPI: Patient is 28 y.o. male who has to of hypertension comes today for followup, patient reported to have lot of dental issues had been to ER for dental pain as per patient he took antibiotic in the past and has been taking ibuprofen 600 800 mg which does not help with the symptoms, patient is requesting referral to see a dentist, he also wants to be checked for STDs, patient denies  multiple severe partners.   Past Medical History  Diagnosis Date  . Asthma   . Hypertension   . Anxiety     History reviewed. No pertinent past surgical history.    Medication List       This list is accurate as of: 07/08/13  4:17 PM.  Always use your most recent med list.               acetaminophen 500 MG tablet  Commonly known as:  TYLENOL  Take 1,000 mg by mouth every 6 (six) hours as needed for headache.     albuterol 108 (90 BASE) MCG/ACT inhaler  Commonly known as:  PROVENTIL HFA;VENTOLIN HFA  Inhale 2 puffs into the lungs every 6 (six) hours as needed for wheezing.     amoxicillin-clavulanate 875-125 MG per tablet  Commonly known as:  AUGMENTIN  Take 1 tablet by mouth 2 (two) times daily.     diphenhydrAMINE 25 mg capsule  Commonly known as:  BENADRYL  Take 25 mg by mouth at bedtime as needed for itching. For allergies     hydrochlorothiazide 25 MG tablet  Commonly known as:  HYDRODIURIL  Take 1 tablet (25 mg total) by mouth daily.     ibuprofen 200 MG tablet  Commonly known as:  ADVIL,MOTRIN  Take 800 mg by mouth every 6 (six) hours as needed for moderate pain.     loratadine 10 MG tablet  Commonly known as:  CLARITIN  Take 10 mg by mouth daily as needed for allergies.     promethazine 25 MG tablet  Commonly known as:  PHENERGAN  Take 1 tablet (25 mg  total) by mouth every 6 (six) hours as needed for nausea or vomiting.     traMADol 50 MG tablet  Commonly known as:  ULTRAM  Take 1 tablet (50 mg total) by mouth every 8 (eight) hours as needed for moderate pain.        Meds ordered this encounter  Medications  . traMADol (ULTRAM) 50 MG tablet    Sig: Take 1 tablet (50 mg total) by mouth every 8 (eight) hours as needed for moderate pain.    Dispense:  30 tablet    Refill:  0  . amoxicillin-clavulanate (AUGMENTIN) 875-125 MG per tablet    Sig: Take 1 tablet by mouth 2 (two) times daily.    Dispense:  20 tablet    Refill:  0     There is no immunization history on file for this patient.  Family History  Problem Relation Age of Onset  . Diabetes Maternal Grandmother   . Hypertension Maternal Grandmother   . Diabetes Maternal Grandfather   . Diabetes Paternal Grandmother   . Heart disease Paternal Grandmother   . Diabetes Paternal Grandfather     History  Substance Use Topics  .  Smoking status: Former Games developermoker  . Smokeless tobacco: Not on file  . Alcohol Use: No    Review of Systems   As noted in HPI  Filed Vitals:   07/08/13 1608  BP: 130/90  Pulse:   Temp:   Resp:     Physical Exam  Physical Exam  HENT:  Dental cavities  Eyes: EOM are normal. Pupils are equal, round, and reactive to light.  Cardiovascular: Normal rate and regular rhythm.   Pulmonary/Chest: Breath sounds normal. No respiratory distress. He has no wheezes. He has no rales.  Musculoskeletal: He exhibits no edema.    CBC    Component Value Date/Time   WBC 4.9 02/10/2013 1635   RBC 5.16 02/10/2013 1635   HGB 16.3 02/10/2013 1635   HCT 45.3 02/10/2013 1635   PLT 202 02/10/2013 1635   MCV 87.8 02/10/2013 1635   LYMPHSABS 1.7 08/12/2012 1423   MONOABS 0.3 08/12/2012 1423   EOSABS 0.1 08/12/2012 1423   BASOSABS 0.0 08/12/2012 1423    CMP     Component Value Date/Time   NA 140 03/24/2013 1615   K 4.1 03/24/2013 1615   CL 101 03/24/2013  1615   CO2 29 03/24/2013 1615   GLUCOSE 94 03/24/2013 1615   BUN 15 03/24/2013 1615   CREATININE 0.93 03/24/2013 1615   CREATININE 0.79 02/10/2013 1635   CALCIUM 9.7 03/24/2013 1615   PROT 7.2 03/24/2013 1615   ALBUMIN 4.5 03/24/2013 1615   AST 27 03/24/2013 1615   ALT 48 03/24/2013 1615   ALKPHOS 39 03/24/2013 1615   BILITOT 0.6 03/24/2013 1615   GFRNONAA >89 03/24/2013 1615   GFRNONAA >90 02/10/2013 1635   GFRAA >89 03/24/2013 1615   GFRAA >90 02/10/2013 1635    No results found for this basename: chol, tri, ldl    No components found with this basename: hga1c    Lab Results  Component Value Date/Time   AST 27 03/24/2013  4:15 PM    Assessment and Plan  Dental cavities - Plan: Ambulatory referral to Dentistry  Pain, dental - Plan: traMADol (ULTRAM) 50 MG tablet, amoxicillin-clavulanate (AUGMENTIN) 875-125 MG per tablet, Ambulatory referral to Dentistry  Essential hypertension, benign - Plan: manual blood pressure is 130/90, advised for DASH diet will repeat blood chemistry continue with hydrochlorothiazide 25 mg daily.  COMPLETE METABOLIC PANEL WITH GFR  Screen for STD (sexually transmitted disease) - Plan: GC/chlamydia probe amp, urine, HIV antibody repeat   Return in about 4 months (around 11/08/2013), or if symptoms worsen or fail to improve, for hypertension.  Doris Cheadleeepak Raigan Baria, MD

## 2013-07-09 ENCOUNTER — Telehealth: Payer: Self-pay

## 2013-07-09 LAB — HIV ANTIBODY (ROUTINE TESTING W REFLEX): HIV: NONREACTIVE

## 2013-07-09 LAB — GC/CHLAMYDIA PROBE AMP, URINE
Chlamydia, Swab/Urine, PCR: NEGATIVE
GC Probe Amp, Urine: NEGATIVE

## 2013-07-09 NOTE — Telephone Encounter (Signed)
Message copied by Lestine MountJUAREZ, Haroon Shatto L on Thu Jul 09, 2013 12:30 PM ------      Message from: Doris CheadleADVANI, DEEPAK      Created: Thu Jul 09, 2013  9:36 AM       Blood work reviewed noticed impaired fasting glucose, call and advise patient for low carbohydrate diet.      Also let the patient know that his STD test is negative for chlamydia, gonorrhea and HIV. ------

## 2013-07-09 NOTE — Telephone Encounter (Signed)
Patient is aware of his lab results 

## 2013-08-20 ENCOUNTER — Other Ambulatory Visit: Payer: Self-pay | Admitting: Internal Medicine

## 2013-12-17 ENCOUNTER — Emergency Department (HOSPITAL_COMMUNITY): Payer: Self-pay

## 2013-12-17 ENCOUNTER — Encounter (HOSPITAL_COMMUNITY): Payer: Self-pay | Admitting: Emergency Medicine

## 2013-12-17 ENCOUNTER — Emergency Department (HOSPITAL_COMMUNITY)
Admission: EM | Admit: 2013-12-17 | Discharge: 2013-12-17 | Disposition: A | Discharge status: Home / Self Care | Payer: Self-pay | Attending: Emergency Medicine | Admitting: Emergency Medicine

## 2013-12-17 DIAGNOSIS — J45909 Unspecified asthma, uncomplicated: Secondary | ICD-10-CM | POA: Insufficient documentation

## 2013-12-17 DIAGNOSIS — Z79899 Other long term (current) drug therapy: Secondary | ICD-10-CM | POA: Insufficient documentation

## 2013-12-17 DIAGNOSIS — I10 Essential (primary) hypertension: Secondary | ICD-10-CM | POA: Insufficient documentation

## 2013-12-17 DIAGNOSIS — R079 Chest pain, unspecified: Secondary | ICD-10-CM

## 2013-12-17 DIAGNOSIS — R0789 Other chest pain: Secondary | ICD-10-CM | POA: Insufficient documentation

## 2013-12-17 DIAGNOSIS — F419 Anxiety disorder, unspecified: Secondary | ICD-10-CM | POA: Insufficient documentation

## 2013-12-17 DIAGNOSIS — Z792 Long term (current) use of antibiotics: Secondary | ICD-10-CM | POA: Insufficient documentation

## 2013-12-17 DIAGNOSIS — Z87891 Personal history of nicotine dependence: Secondary | ICD-10-CM | POA: Insufficient documentation

## 2013-12-17 LAB — COMPREHENSIVE METABOLIC PANEL
ALT: 41 U/L (ref 0–53)
AST: 24 U/L (ref 0–37)
Albumin: 4.1 g/dL (ref 3.5–5.2)
Alkaline Phosphatase: 43 U/L (ref 39–117)
Anion gap: 12 (ref 5–15)
BUN: 12 mg/dL (ref 6–23)
CALCIUM: 9.5 mg/dL (ref 8.4–10.5)
CO2: 26 mEq/L (ref 19–32)
Chloride: 103 mEq/L (ref 96–112)
Creatinine, Ser: 0.87 mg/dL (ref 0.50–1.35)
GFR calc Af Amer: >90 mL/min (ref >90)
GFR calc non Af Amer: >90 mL/min (ref >90)
Glucose, Bld: 110 mg/dL — ABNORMAL HIGH (ref 70–99)
POTASSIUM: 3.9 meq/L (ref 3.7–5.3)
SODIUM: 141 meq/L (ref 137–147)
TOTAL PROTEIN: 7.6 g/dL (ref 6.0–8.3)
Total Bilirubin: 0.6 mg/dL (ref 0.3–1.2)

## 2013-12-17 LAB — CBC WITH DIFFERENTIAL/PLATELET
Basophils Absolute: 0 10*3/uL (ref 0.0–0.1)
Basophils Relative: 0 % (ref 0–1)
EOS ABS: 0.1 10*3/uL (ref 0.0–0.7)
Eosinophils Relative: 2 % (ref 0–5)
HCT: 44 % (ref 39.0–52.0)
Hemoglobin: 15.4 g/dL (ref 13.0–17.0)
Lymphocytes Relative: 36 % (ref 12–46)
Lymphs Abs: 1.7 10*3/uL (ref 0.7–4.0)
MCH: 30.9 pg (ref 26.0–34.0)
MCHC: 35 g/dL (ref 30.0–36.0)
MCV: 88.4 fL (ref 78.0–100.0)
Monocytes Absolute: 0.2 10*3/uL (ref 0.1–1.0)
Monocytes Relative: 5 % (ref 3–12)
NEUTROS PCT: 57 % (ref 43–77)
Neutro Abs: 2.7 10*3/uL (ref 1.7–7.7)
PLATELETS: 207 10*3/uL (ref 150–400)
RBC: 4.98 MIL/uL (ref 4.22–5.81)
RDW: 13.5 % (ref 11.5–15.5)
WBC: 4.7 10*3/uL (ref 4.0–10.5)

## 2013-12-17 LAB — I-STAT TROPONIN, ED: TROPONIN I, POC: 0.01 ng/mL (ref 0.00–0.08)

## 2013-12-17 MED ORDER — OMEPRAZOLE 20 MG PO CPDR: 20.0000 mg | DELAYED_RELEASE_CAPSULE | Freq: Every day | ORAL | Status: DC

## 2013-12-17 MED ORDER — NAPROXEN 500 MG PO TABS: 500.0000 mg | ORAL_TABLET | Freq: Two times a day (BID) | ORAL | Status: DC

## 2013-12-17 NOTE — Discharge Instructions (Signed)

## 2013-12-17 NOTE — ED Provider Notes (Signed)
CSN: [id number]     Arrival date & time 12/17/13  1621 History   First MD Initiated Contact with Patient 12/17/13 1741     Chief Complaint  Patient presents with  . Chest Pain  . Abdominal Pain  . Pleurisy     (Consider location/radiation/quality/duration/timing/severity/associated sxs/prior Treatment) HPI Walter Howell is a 28 y.o. male history of asthma, hypertension and anxiety comes in for evaluation of chest discomfort. Patient states that for the past 3 days he has had a intermittent sharp pain in his right chest that does not radiate. There is no associated nausea, vomiting, diaphoresis, numbness or weakness. The pain is nonexertional and is not associated with any specific timing or pattern. Patient has not tried anything for his symptoms. States sometimes leaning forward makes it feel better, but nothing really makes it worse. Pt does report reflux daily that he treats with Zantac prn. Denies fevers, shortness of breath, cough, abdominal pain, n/v/d/c Former smoker, quit 3 months ago.  Past Medical History  Diagnosis Date  . Asthma   . Hypertension   . Anxiety    History reviewed. No pertinent past surgical history. Family History  Problem Relation Age of Onset  . Diabetes Maternal Grandmother   . Hypertension Maternal Grandmother   . Diabetes Maternal Grandfather   . Diabetes Paternal Grandmother   . Heart disease Paternal Grandmother   . Diabetes Paternal Grandfather    History  Substance Use Topics  . Smoking status: Former Games developermoker  . Smokeless tobacco: Not on file  . Alcohol Use: No    Review of Systems  Constitutional: Negative for fever.  HENT: Negative for sore throat.   Eyes: Negative for visual disturbance.  Respiratory: Negative for shortness of breath.   Cardiovascular: Positive for chest pain.  Gastrointestinal: Negative for abdominal pain.  Endocrine: Negative for polyuria.  Genitourinary: Negative for dysuria.  Skin: Negative for rash.   Neurological: Negative for headaches.      Allergies  Review of patient's allergies indicates no known allergies.  Home Medications   Prior to Admission medications   Medication Sig Start Date End Date Taking? Authorizing Provider  acetaminophen (TYLENOL) 500 MG tablet Take 1,000 mg by mouth every 6 (six) hours as needed for headache.    Historical Provider, MD  albuterol (PROVENTIL HFA;VENTOLIN HFA) 108 (90 BASE) MCG/ACT inhaler Inhale 2 puffs into the lungs every 6 (six) hours as needed for wheezing.    Historical Provider, MD  amoxicillin-clavulanate (AUGMENTIN) 875-125 MG per tablet Take 1 tablet by mouth 2 (two) times daily. 07/08/13   Doris Cheadleeepak Advani, MD  diphenhydrAMINE (BENADRYL) 25 mg capsule Take 25 mg by mouth at bedtime as needed for itching. For allergies    Historical Provider, MD  hydrochlorothiazide (HYDRODIURIL) 25 MG tablet TAKE 1 TABLET BY MOUTH DAILY.    Richarda OverlieNayana Abrol, MD  ibuprofen (ADVIL,MOTRIN) 200 MG tablet Take 800 mg by mouth every 6 (six) hours as needed for moderate pain.    Historical Provider, MD  loratadine (CLARITIN) 10 MG tablet Take 10 mg by mouth daily as needed for allergies.    Historical Provider, MD  promethazine (PHENERGAN) 25 MG tablet Take 1 tablet (25 mg total) by mouth every 6 (six) hours as needed for nausea or vomiting. 06/25/13   Arie Sabinaatherine E Schinlever, PA-C  traMADol (ULTRAM) 50 MG tablet Take 1 tablet (50 mg total) by mouth every 8 (eight) hours as needed for moderate pain. 07/08/13   Doris Cheadleeepak Advani, MD   BP 158/84  Pulse 71  Temp(Src) 97.7 F (36.5 C) (Oral)  Resp 16  Ht 5\' 11"  (1.803 m)  Wt 220 lb (99.791 kg)  BMI 30.70 kg/m2  SpO2 96% Physical Exam  Nursing note and vitals reviewed. Constitutional: He is oriented to person, place, and time. He appears well-developed and well-nourished.  HENT:  Head: Normocephalic and atraumatic.  Mouth/Throat: Oropharynx is clear and moist.  Eyes: Conjunctivae are normal. Pupils are equal, round, and  reactive to light. Right eye exhibits no discharge. Left eye exhibits no discharge. No scleral icterus.  Neck: Neck supple.  Cardiovascular: Normal rate, regular rhythm and normal heart sounds.   Pulmonary/Chest: Effort normal and breath sounds normal. No respiratory distress. He has no wheezes. He has no rales. He exhibits no tenderness.  Abdominal: Soft. He exhibits no distension and no mass. There is no tenderness. There is no rebound and no guarding.  Musculoskeletal: He exhibits no tenderness.  Neurological: He is alert and oriented to person, place, and time.  Cranial Nerves II-XII grossly intact  Skin: Skin is warm and dry. No rash noted.  Psychiatric: He has a normal mood and affect.    ED Course  Procedures (including critical care time) Labs Review Labs Reviewed  COMPREHENSIVE METABOLIC PANEL - Abnormal; Notable for the following:    Glucose, Bld 110 (*)    All other components within normal limits  CBC WITH DIFFERENTIAL  Rosezena SensorI-STAT TROPOININ, ED    Imaging Review Dg Chest 2 View  12/17/2013   CLINICAL DATA:  Chest pain.  EXAM: CHEST  2 VIEW  COMPARISON:  June 25, 2013.  FINDINGS: The heart size and mediastinal contours are within normal limits. Both lungs are clear. No pneumothorax or pleural effusion is noted. The visualized skeletal structures are unremarkable.  IMPRESSION: No acute cardiopulmonary abnormality seen.   Electronically Signed   By: Roque LiasJames  Green M.D.   On: 12/17/2013 16:56     EKG Interpretation None      MDM  Vitals stable - WNL -afebrile Pt resting comfortably in ED. Heart Score 2. PERC Negative. PE not concerning for acute or emergent pathology.  Labwork essentially normal and noncontributory Imaging shows no acute cardio pulmonary pathology. DDX likely exacerbation of GERD or musculoskeletal in nature, will treat with Omeprazole and symptomatic care.   Discussed f/u with PCP and return precautions, pt very amenable to plan.   Prior to patient  discharge, I discussed and reviewed this case with Dr. Loretha StaplerWofford    Final diagnoses:  Chest pain          Sharlene MottsBenjamin W Dominion Kathan, PA-C 12/17/13 1908

## 2013-12-17 NOTE — ED Provider Notes (Signed)
Medical screening examination/treatment/procedure(s) were conducted as a shared visit with non-physician practitioner(s) and myself.  I personally evaluated the patient during the encounter.   EKG Interpretation   Date/Time:  Thursday December 17 2013 16:25:59 EDT Ventricular Rate:  66 PR Interval:  170 QRS Duration: 86 QT Interval:  352 QTC Calculation: 369 R Axis:   69 Text Interpretation:  Normal sinus rhythm with sinus arrhythmia  Nonspecific ST and T wave abnormality Abnormal ECG No significant change  was found Confirmed by [facility]  [facility], TREY ([id number]) on 12/17/2013 6:35:11 PM      28 yo male with complaint of epigastric and RUQ abdominal pain.  States it has been off and on for a few days.  He also complains of severe heartburn that he gets daily and for which he takes Zantac.  On exam, well appearing, nontoxic, not distressed, normal respiratory effort, normal perfusion, abdomen soft, minimal tenderness in epigastrium, chest nontender, heart sounds normal with RRR, lungs CTAB.  His clinical picture is not consistent with pancreatitis or biliary disease.   I suspect gastritis.  Plan to treat with adding omeprazole to regimen and having him follow up with PCP.  Clinical Impression: Abdominal pain, epigastric   Warnell Foresterrey Ardie Mclennan, MD 12/17/13 [phone]

## 2013-12-17 NOTE — ED Notes (Addendum)
Rt. Sided cp, reproducible with inspiration. Rt. Epigastric pain (under ribs). Non-productive cough. HAVING MORE STOMACH PAIN THAN CHEST PAIN.

## 2013-12-17 NOTE — ED Provider Notes (Signed)
Medical screening examination/treatment/procedure(s) were conducted as a shared visit with non-physician practitioner(s) and myself.  I personally evaluated the patient during the encounter.   EKG Interpretation   Date/Time:  Thursday December 17 2013 16:25:59 EDT Ventricular Rate:  66 PR Interval:  170 QRS Duration: 86 QT Interval:  352 QTC Calculation: 369 R Axis:   69 Text Interpretation:  Normal sinus rhythm with sinus arrhythmia  Nonspecific ST and T wave abnormality Abnormal ECG No significant change  was found Confirmed by [facility]  [facility], TREY ([id number]) on 12/17/2013 6:35:11 PM        Warnell Foresterrey Dariush Mcnellis, MD 12/17/13 [phone]

## 2014-01-07 ENCOUNTER — Emergency Department (HOSPITAL_COMMUNITY)
Admission: EM | Admit: 2014-01-07 | Discharge: 2014-01-07 | Disposition: A | Discharge status: Home / Self Care | Payer: Self-pay | Attending: Emergency Medicine | Admitting: Emergency Medicine

## 2014-01-07 ENCOUNTER — Encounter (HOSPITAL_COMMUNITY): Payer: Self-pay | Admitting: Emergency Medicine

## 2014-01-07 DIAGNOSIS — I10 Essential (primary) hypertension: Secondary | ICD-10-CM | POA: Insufficient documentation

## 2014-01-07 DIAGNOSIS — Z8659 Personal history of other mental and behavioral disorders: Secondary | ICD-10-CM | POA: Insufficient documentation

## 2014-01-07 DIAGNOSIS — Z87891 Personal history of nicotine dependence: Secondary | ICD-10-CM | POA: Insufficient documentation

## 2014-01-07 DIAGNOSIS — K0889 Other specified disorders of teeth and supporting structures: Secondary | ICD-10-CM

## 2014-01-07 DIAGNOSIS — Z791 Long term (current) use of non-steroidal anti-inflammatories (NSAID): Secondary | ICD-10-CM | POA: Insufficient documentation

## 2014-01-07 DIAGNOSIS — J45909 Unspecified asthma, uncomplicated: Secondary | ICD-10-CM | POA: Insufficient documentation

## 2014-01-07 DIAGNOSIS — K088 Other specified disorders of teeth and supporting structures: Secondary | ICD-10-CM | POA: Insufficient documentation

## 2014-01-07 DIAGNOSIS — Z79899 Other long term (current) drug therapy: Secondary | ICD-10-CM | POA: Insufficient documentation

## 2014-01-07 MED ORDER — PENICILLIN V POTASSIUM 500 MG PO TABS: 500.0000 mg | ORAL_TABLET | Freq: Three times a day (TID) | ORAL | Status: DC

## 2014-01-07 MED ORDER — TRAMADOL HCL 50 MG PO TABS: 50.0000 mg | ORAL_TABLET | Freq: Four times a day (QID) | ORAL | Status: DC | PRN

## 2014-01-07 NOTE — ED Provider Notes (Signed)
CSN: [id number]     Arrival date & time 01/07/14  1019 History  This chart was scribed for non-physician practitioner working with Nelia Shiobert L Beaton, MD by Richarda Overlieichard Holland, ED Scribe. This patient was seen in room WTR7/WTR7 and the patient's care was started at 12:39 PM.    Chief Complaint  Patient presents with  . Dental Pain    pain in lower l/mouth x 4 month  . Headache    headache pain due to toothache   HPI HPI Comments: Walter Howell is a 28 y.o. male with a history of HTN who presents to the Emergency Department complaining of left dental pain in lower jaw for the past 1 month. Pt states the pain radiates to his left temporal area and reports an associated HA. He states he chipped a lower molar about 2 months ago when chewing, and has had increasing pain since then. He reports he had a fever with a maximum temperature of 100.2 a couple of nights ago. Pt reports using 800mg  ibuprofen 3/4x daily for the past 2 months which failed to provide relief to his symptoms. He says he also has pain in his right sided teeth where he had surgery 2 years ago for a cavity that got infected. He reports he had 2 teeth on right lower side removed. He denies chills, nausea, vomiting. Pt states he has no insurance and seeks a referral for a dentist.    PCP ADVANI   Past Medical History  Diagnosis Date  . Asthma   . Hypertension   . Anxiety    History reviewed. No pertinent past surgical history. Family History  Problem Relation Age of Onset  . Diabetes Maternal Grandmother   . Hypertension Maternal Grandmother   . Diabetes Maternal Grandfather   . Diabetes Paternal Grandmother   . Heart disease Paternal Grandmother   . Diabetes Paternal Grandfather    History  Substance Use Topics  . Smoking status: Former Games developermoker  . Smokeless tobacco: Not on file  . Alcohol Use: No    Review of Systems  Constitutional: Positive for fever. Negative for chills.  HENT: Positive for dental problem.    Gastrointestinal: Negative for nausea and vomiting.  Neurological: Positive for headaches.      Allergies  Review of patient's allergies indicates no known allergies.  Home Medications   Prior to Admission medications   Medication Sig Start Date End Date Taking? Authorizing Provider  acetaminophen (TYLENOL) 500 MG tablet Take 1,000 mg by mouth every 6 (six) hours as needed for headache.    Historical Provider, MD  albuterol (PROVENTIL HFA;VENTOLIN HFA) 108 (90 BASE) MCG/ACT inhaler Inhale 2 puffs into the lungs every 6 (six) hours as needed for wheezing.    Historical Provider, MD  hydrochlorothiazide (HYDRODIURIL) 25 MG tablet TAKE 1 TABLET BY MOUTH DAILY.    Richarda OverlieNayana Abrol, MD  naproxen (NAPROSYN) 500 MG tablet Take 1 tablet (500 mg total) by mouth 2 (two) times daily. 12/17/13   Earle GellBenjamin W Cartner, PA-C  omeprazole (PRILOSEC) 20 MG capsule Take 1 capsule (20 mg total) by mouth daily. 12/17/13   Earle GellBenjamin W Cartner, PA-C   BP 139/82 mmHg  Pulse 55  Temp(Src) 97.8 F (36.6 C) (Oral)  Resp 16  SpO2 97% Physical Exam  Constitutional: He is oriented to person, place, and time. He appears well-developed and well-nourished.  HENT:  Head: Normocephalic and atraumatic.  Tooth 19 with a partial dental avulsion involving one fourth of the tooth with dental decay  noted. Low ginginval erythema. No trismus.   Neck: Normal range of motion. Neck supple.  Cardiovascular: Normal rate.   Pulmonary/Chest: Effort normal. No respiratory distress.  Abdominal: He exhibits no distension.  Neurological: He is alert and oriented to person, place, and time.  Skin: Skin is warm and dry.  Psychiatric: He has a normal mood and affect. His behavior is normal.  Nursing note and vitals reviewed.   ED Course  Procedures  DIAGNOSTIC STUDIES: Oxygen Saturation is 97% on RA, normal by my interpretation.    COORDINATION OF CARE: 12:41 PM Discussed treatment plan with pt at bedside and pt agreed to plan.  Discused risk of taking 800mg  ibuprofen 3/4xdaily for a long period of time which can potentially injure vital organs. Offered to screen for kidney injury due to prolonged use of NSAIDs however patient declined. Advised follow up with doctor to check blood work and kidney function. Will prescribe Abx. Advised to follow up with dentist as soon as possible.   Labs Review Labs Reviewed - No data to display  Imaging Review No results found.   EKG Interpretation None      MDM   Final diagnoses:  Pain, dental   BP 139/82 mmHg  Pulse 55  Temp(Src) 97.8 F (36.6 C) (Oral)  Resp 16  SpO2 97%   I personally performed the services described in this documentation, which was scribed in my presence. The recorded information has been reviewed and is accurate.       Fayrene HelperBowie Conny Moening, PA-C 01/07/14 1250  Nelia Shiobert L Beaton, MD 01/13/14 [phone]

## 2014-01-07 NOTE — ED Notes (Signed)
Pt reports pain in lower l/jaw-teeth x 4 months. Has not seen dentist. C/o headache pain due to throbbing from tooth. [facility]

## 2014-01-07 NOTE — Progress Notes (Signed)
[facility] Specialist Stacy,  Provided pt with a [facility] Public relations account executiveCard application, highlighting patient p[facility]. Writer also scheduled patient appt for enrollment with [facility] program for 12/4 at 2:00pm with [facility].

## 2014-01-07 NOTE — Discharge Instructions (Signed)
Please follow up promptly with a dentist in the next 2 days for further management of your dental pain.  Take ibuprofen or tylenol only as recommended on the bottle.  Avoid taking more medication than indicated as it can injure your vital organs.    Dental Pain A tooth ache may be caused by cavities (tooth decay). Cavities expose the nerve of the tooth to air and hot or cold temperatures. It may come from an infection or abscess (also called a boil or furuncle) around your tooth. It is also often caused by dental caries (tooth decay). This causes the pain you are having. DIAGNOSIS  Your caregiver can diagnose this problem by exam. TREATMENT   If caused by an infection, it may be treated with medications which kill germs (antibiotics) and pain medications as prescribed by your caregiver. Take medications as directed.  Only take over-the-counter or prescription medicines for pain, discomfort, or fever as directed by your caregiver.  Whether the tooth ache today is caused by infection or dental disease, you should see your dentist as soon as possible for further care. SEEK MEDICAL CARE IF: The exam and treatment you received today has been provided on an emergency basis only. This is not a substitute for complete medical or dental care. If your problem worsens or new problems (symptoms) appear, and you are unable to meet with your dentist, call or return to this location. SEEK IMMEDIATE MEDICAL CARE IF:   You have a fever.  You develop redness and swelling of your face, jaw, or neck.  You are unable to open your mouth.  You have severe pain uncontrolled by pain medicine. MAKE SURE YOU:   Understand these instructions.  Will watch your condition.  Will get help right away if you are not doing well or get worse. Document Released: 02/12/2005 Document Revised: 05/07/2011 Document Reviewed: 10/01/2007 [facility] Patient Information 2015 [place], [place]. This information is not intended to  replace advice given to you by your health care provider. Make sure you discuss any questions you have with your health care provider.

## 2014-01-29 ENCOUNTER — Other Ambulatory Visit: Payer: Self-pay | Admitting: Internal Medicine

## 2014-01-29 ENCOUNTER — Ambulatory Visit: Payer: Self-pay

## 2014-02-02 ENCOUNTER — Telehealth: Payer: Self-pay | Admitting: Internal Medicine

## 2014-02-02 NOTE — Telephone Encounter (Signed)
Patient has called in today to get a refill on medication hydrochlorothiazide (HYDRODIURIL) 25 MG tablet; please f/u with patient as he has not been taking them for a week; patient was briefed on the importance on maintaining regular f/u with PCP to keep medications current;

## 2014-02-02 NOTE — Telephone Encounter (Signed)
Pt. Has ran out of his medication since last week....Marland Kitchen.Marland Kitchen.please f/u with patient regarding refill on medication  :  hydrochlorothiazide (HYDRODIURIL) 25 MG tablet [[id number]][[id number]]

## 2014-02-04 NOTE — Telephone Encounter (Signed)
Pt scheduled appt for f/u on 02/12/14 but has run out of bp meds and is requesting refill. Please f/u with pt.

## 2014-02-08 ENCOUNTER — Emergency Department (HOSPITAL_COMMUNITY): Payer: Self-pay

## 2014-02-08 ENCOUNTER — Emergency Department (HOSPITAL_COMMUNITY)
Admission: EM | Admit: 2014-02-08 | Discharge: 2014-02-08 | Discharge status: Against Medical Advice | Payer: Self-pay | Attending: Emergency Medicine | Admitting: Emergency Medicine

## 2014-02-08 ENCOUNTER — Encounter (HOSPITAL_COMMUNITY): Payer: Self-pay | Admitting: Emergency Medicine

## 2014-02-08 DIAGNOSIS — R05 Cough: Secondary | ICD-10-CM | POA: Insufficient documentation

## 2014-02-08 DIAGNOSIS — R110 Nausea: Secondary | ICD-10-CM | POA: Insufficient documentation

## 2014-02-08 DIAGNOSIS — F419 Anxiety disorder, unspecified: Secondary | ICD-10-CM | POA: Insufficient documentation

## 2014-02-08 DIAGNOSIS — R079 Chest pain, unspecified: Secondary | ICD-10-CM

## 2014-02-08 DIAGNOSIS — I10 Essential (primary) hypertension: Secondary | ICD-10-CM | POA: Insufficient documentation

## 2014-02-08 DIAGNOSIS — J45909 Unspecified asthma, uncomplicated: Secondary | ICD-10-CM | POA: Insufficient documentation

## 2014-02-08 HISTORY — DX: Gastro-esophageal reflux disease without esophagitis: K21.9

## 2014-02-08 LAB — BASIC METABOLIC PANEL
ANION GAP: 13 (ref 5–15)
BUN: 15 mg/dL (ref 6–23)
CALCIUM: 9.9 mg/dL (ref 8.4–10.5)
CHLORIDE: 103 meq/L (ref 96–112)
CO2: 24 mEq/L (ref 19–32)
Creatinine, Ser: 0.87 mg/dL (ref 0.50–1.35)
GFR calc non Af Amer: >90 mL/min (ref >90)
Glucose, Bld: 90 mg/dL (ref 70–99)
Potassium: 4.1 mEq/L (ref 3.7–5.3)
Sodium: 140 mEq/L (ref 137–147)

## 2014-02-08 LAB — I-STAT TROPONIN, ED: Troponin i, poc: 0 ng/mL (ref 0.00–0.08)

## 2014-02-08 LAB — CBC
HCT: 43.8 % (ref 39.0–52.0)
Hemoglobin: 11.5 g/dL — ABNORMAL LOW (ref 13.0–17.0)
MCH: 23.5 pg — AB (ref 26.0–34.0)
MCHC: 26.3 g/dL — ABNORMAL LOW (ref 30.0–36.0)
MCV: 89.6 fL (ref 78.0–100.0)
PLATELETS: 184 10*3/uL (ref 150–400)
RBC: 4.89 MIL/uL (ref 4.22–5.81)
RDW: 13.4 % (ref 11.5–15.5)
WBC: 6.4 10*3/uL (ref 4.0–10.5)

## 2014-02-08 NOTE — ED Notes (Signed)
Pt. reports intermittent mid/right chest pain with occasional dry cough and mild nausea and intermittent anxiety attack for the past 2 months , respirations unlabored .

## 2014-02-09 ENCOUNTER — Telehealth: Payer: Self-pay | Admitting: Internal Medicine

## 2014-02-09 NOTE — Telephone Encounter (Signed)
Pt. Is calling to request a med refill for hydrochlorothiazide (HYDRODIURIL) 25 MG tablet, pt. Would like to get enough medication to last him until OV with PCP. Please f/u with pt.

## 2014-02-10 ENCOUNTER — Emergency Department (HOSPITAL_COMMUNITY)
Admission: EM | Admit: 2014-02-10 | Discharge: 2014-02-10 | Disposition: A | Discharge status: Home / Self Care | Payer: Self-pay | Attending: Emergency Medicine | Admitting: Emergency Medicine

## 2014-02-10 ENCOUNTER — Encounter (HOSPITAL_COMMUNITY): Payer: Self-pay | Admitting: Emergency Medicine

## 2014-02-10 ENCOUNTER — Emergency Department (HOSPITAL_COMMUNITY): Payer: Self-pay

## 2014-02-10 DIAGNOSIS — Z79899 Other long term (current) drug therapy: Secondary | ICD-10-CM | POA: Insufficient documentation

## 2014-02-10 DIAGNOSIS — Z87891 Personal history of nicotine dependence: Secondary | ICD-10-CM | POA: Insufficient documentation

## 2014-02-10 DIAGNOSIS — F419 Anxiety disorder, unspecified: Secondary | ICD-10-CM | POA: Insufficient documentation

## 2014-02-10 DIAGNOSIS — R0789 Other chest pain: Secondary | ICD-10-CM | POA: Insufficient documentation

## 2014-02-10 DIAGNOSIS — J45901 Unspecified asthma with (acute) exacerbation: Secondary | ICD-10-CM | POA: Insufficient documentation

## 2014-02-10 DIAGNOSIS — K219 Gastro-esophageal reflux disease without esophagitis: Secondary | ICD-10-CM | POA: Insufficient documentation

## 2014-02-10 DIAGNOSIS — R0602 Shortness of breath: Secondary | ICD-10-CM

## 2014-02-10 DIAGNOSIS — I10 Essential (primary) hypertension: Secondary | ICD-10-CM | POA: Insufficient documentation

## 2014-02-10 LAB — BASIC METABOLIC PANEL
ANION GAP: 13 (ref 5–15)
BUN: 17 mg/dL (ref 6–23)
CALCIUM: 9.6 mg/dL (ref 8.4–10.5)
CO2: 26 mEq/L (ref 19–32)
CREATININE: 0.89 mg/dL (ref 0.50–1.35)
Chloride: 99 mEq/L (ref 96–112)
GFR calc non Af Amer: >90 mL/min (ref >90)
Glucose, Bld: 105 mg/dL — ABNORMAL HIGH (ref 70–99)
Potassium: 4 mEq/L (ref 3.7–5.3)
Sodium: 138 mEq/L (ref 137–147)

## 2014-02-10 LAB — I-STAT TROPONIN, ED: Troponin i, poc: 0 ng/mL (ref 0.00–0.08)

## 2014-02-10 LAB — CBC
HCT: 47.1 % (ref 39.0–52.0)
Hemoglobin: 16.4 g/dL (ref 13.0–17.0)
MCH: 31.6 pg (ref 26.0–34.0)
MCHC: 34.8 g/dL (ref 30.0–36.0)
MCV: 90.8 fL (ref 78.0–100.0)
PLATELETS: 202 10*3/uL (ref 150–400)
RBC: 5.19 MIL/uL (ref 4.22–5.81)
RDW: 13.5 % (ref 11.5–15.5)
WBC: 5.6 10*3/uL (ref 4.0–10.5)

## 2014-02-10 MED ORDER — HYDROCHLOROTHIAZIDE 25 MG PO TABS: 25.0000 mg | ORAL_TABLET | Freq: Every morning | ORAL | Status: DC

## 2014-02-10 MED ORDER — DIAZEPAM 5 MG PO TABS
5.0000 mg | ORAL_TABLET | Freq: Once | ORAL | Status: AC
  Administered 2014-02-10: 5 mg via ORAL
  Filled 2014-02-10: qty 1

## 2014-02-10 MED ORDER — KETOROLAC TROMETHAMINE 60 MG/2ML IM SOLN
60.0000 mg | Freq: Once | INTRAMUSCULAR | Status: DC
  Filled 2014-02-10: qty 2

## 2014-02-10 MED ORDER — OXYCODONE-ACETAMINOPHEN 5-325 MG PO TABS: 1.0000 | ORAL_TABLET | ORAL | Status: DC | PRN

## 2014-02-10 MED ORDER — PREDNISONE 10 MG PO TABS: 20.0000 mg | ORAL_TABLET | Freq: Every day | ORAL | Status: DC

## 2014-02-10 NOTE — ED Notes (Signed)
Pt c/o constant rt sided cp x 4 days.  [facility] because "I wasn't about to wait 5 hours".  Pt endorses SOB and nausea w/ lightheadedness.

## 2014-02-10 NOTE — ED Provider Notes (Signed)
CSN: [id number]     Arrival date & time 02/10/14  1215 History   First MD Initiated Contact with Patient 02/10/14 1313     Chief Complaint  Patient presents with  . Chest Pain     (Consider location/radiation/quality/duration/timing/severity/associated sxs/prior Treatment) HPI Comments: Patient here complaining of constant right-sided chest pain 4 days. Pain characterized as sharp and worse with movement. No fever or cough reported. No associated diaphoresis or dyspnea. Symptoms better with remaining still. Has used Tylenol and Motrin with limited relief. Denies any leg pain or swelling. No rashes to his skin noted. Nothing makes the symptoms better.  Patient is a 28 y.o. male presenting with chest pain. The history is provided by the patient.  Chest Pain   Past Medical History  Diagnosis Date  . Asthma   . Hypertension   . Anxiety   . GERD (gastroesophageal reflux disease)    No past surgical history on file. Family History  Problem Relation Age of Onset  . Diabetes Maternal Grandmother   . Hypertension Maternal Grandmother   . Diabetes Maternal Grandfather   . Diabetes Paternal Grandmother   . Heart disease Paternal Grandmother   . Diabetes Paternal Grandfather    History  Substance Use Topics  . Smoking status: Former Games developermoker  . Smokeless tobacco: Not on file  . Alcohol Use: No    Review of Systems  Cardiovascular: Positive for chest pain.  All other systems reviewed and are negative.     Allergies  Review of patient's allergies indicates no known allergies.  Home Medications   Prior to Admission medications   Medication Sig Start Date End Date Taking? Authorizing Provider  acetaminophen (TYLENOL) 500 MG tablet Take 1,000 mg by mouth every 6 (six) hours as needed for moderate pain or headache.    Yes Historical Provider, MD  albuterol (PROVENTIL HFA;VENTOLIN HFA) 108 (90 BASE) MCG/ACT inhaler Inhale 2 puffs into the lungs every 6 (six) hours as needed for  wheezing or shortness of breath.    Yes Historical Provider, MD  hydrochlorothiazide (HYDRODIURIL) 25 MG tablet Take 25 mg by mouth every morning.   Yes Historical Provider, MD  omeprazole (PRILOSEC) 20 MG capsule Take 1 capsule (20 mg total) by mouth daily. 12/17/13  Yes Earle GellBenjamin W Cartner, PA-C  hydrochlorothiazide (HYDRODIURIL) 25 MG tablet TAKE 1 TABLET BY MOUTH DAILY. Patient not taking: Reported on 02/10/2014    Richarda OverlieNayana Abrol, MD  naproxen (NAPROSYN) 500 MG tablet Take 1 tablet (500 mg total) by mouth 2 (two) times daily. Patient not taking: Reported on 02/10/2014 12/17/13   Earle GellBenjamin W Cartner, PA-C  penicillin v potassium (VEETID) 500 MG tablet Take 1 tablet (500 mg total) by mouth 3 (three) times daily. Patient not taking: Reported on 02/10/2014 01/07/14   Fayrene HelperBowie Tran, PA-C  traMADol (ULTRAM) 50 MG tablet Take 1 tablet (50 mg total) by mouth every 6 (six) hours as needed. Patient not taking: Reported on 02/10/2014 01/07/14   Fayrene HelperBowie Tran, PA-C   BP 142/76 mmHg  Pulse 64  Temp(Src) 97.9 F (36.6 C) (Oral)  Resp 18  SpO2 100% Physical Exam  Constitutional: He is oriented to person, place, and time. He appears well-developed and well-nourished.  Non-toxic appearance. No distress.  HENT:  Head: Normocephalic and atraumatic.  Eyes: Conjunctivae, EOM and lids are normal. Pupils are equal, round, and reactive to light.  Neck: Normal range of motion. Neck supple. No tracheal deviation present. No thyroid mass present.  Cardiovascular: Normal rate, regular rhythm and  normal heart sounds.  Exam reveals no gallop.   No murmur heard. Pulmonary/Chest: Effort normal and breath sounds normal. No stridor. No respiratory distress. He has no decreased breath sounds. He has no wheezes. He has no rhonchi. He has no rales. He exhibits bony tenderness. He exhibits no crepitus.    Abdominal: Soft. Normal appearance and bowel sounds are normal. He exhibits no distension. There is no tenderness. There is no  rebound and no CVA tenderness.  Musculoskeletal: Normal range of motion. He exhibits no edema or tenderness.  Neurological: He is alert and oriented to person, place, and time. He has normal strength. No cranial nerve deficit or sensory deficit. GCS eye subscore is 4. GCS verbal subscore is 5. GCS motor subscore is 6.  Skin: Skin is warm and dry. No abrasion and no rash noted.  Psychiatric: He has a normal mood and affect. His speech is normal and behavior is normal.  Nursing note and vitals reviewed.   ED Course  Procedures (including critical care time) Labs Review Labs Reviewed  BASIC METABOLIC PANEL - Abnormal; Notable for the following:    Glucose, Bld 105 (*)    All other components within normal limits  CBC  I-STAT TROPOININ, ED    Imaging Review Dg Chest 2 View  02/08/2014   CLINICAL DATA:  Right upper quadrant and right chest pain for 2 days.  EXAM: CHEST  2 VIEW  COMPARISON:  PA and lateral chest 12/17/2013.  FINDINGS: Heart size and mediastinal contours are within normal limits. Both lungs are clear. Visualized skeletal structures are unremarkable.  IMPRESSION: Negative exam.   Electronically Signed   By: Drusilla Kannerhomas  Dalessio M.D.   On: 02/08/2014 21:24     EKG Interpretation   Date/Time:  Wednesday February 10 2014 12:25:21 EST Ventricular Rate:  63 PR Interval:  168 QRS Duration: 79 QT Interval:  360 QTC Calculation: 368 R Axis:   52 Text Interpretation:  Sinus rhythm ST elevation suggests acute  pericarditis Baseline wander in lead(s) V4 V6 No significant change since  last tracing Confirmed by Nuri Branca  MD, Kashten Gowin ([id number]) on 02/10/2014 1:30:39  PM      MDM   Final diagnoses:  SOB (shortness of breath)     Patient given medications here for chest wall pain and feels better. No concern for ACS or PE. He is stable for discharge   Toy BakerAnthony T Gaelyn Tukes, MD 02/10/14 1528

## 2014-02-10 NOTE — ED Notes (Signed)
Patient transported back from X-ray 

## 2014-02-10 NOTE — Discharge Instructions (Signed)
Chest Pain (Nonspecific) °It is often hard to give a specific diagnosis for the cause of chest pain. There is always a chance that your pain could be related to something serious, such as a heart attack or a blood clot in the lungs. You need to follow up with your health care provider for further evaluation. °CAUSES  °· Heartburn. °· Pneumonia or bronchitis. °· Anxiety or stress. °· Inflammation around your heart (pericarditis) or lung (pleuritis or pleurisy). °· A blood clot in the lung. °· A collapsed lung (pneumothorax). It can develop suddenly on its own (spontaneous pneumothorax) or from trauma to the chest. °· Shingles infection (herpes zoster virus). °The chest wall is composed of bones, muscles, and cartilage. Any of these can be the source of the pain. °· The bones can be bruised by injury. °· The muscles or cartilage can be strained by coughing or overwork. °· The cartilage can be affected by inflammation and become sore (costochondritis). °DIAGNOSIS  °Lab tests or other studies may be needed to find the cause of your pain. Your health care provider may have you take a test called an ambulatory electrocardiogram (ECG). An ECG records your heartbeat patterns over a 24-hour period. You may also have other tests, such as: °· Transthoracic echocardiogram (TTE). During echocardiography, sound waves are used to evaluate how blood flows through your heart. °· Transesophageal echocardiogram (TEE). °· Cardiac monitoring. This allows your health care provider to monitor your heart rate and rhythm in real time. °· Holter monitor. This is a portable device that records your heartbeat and can help diagnose heart arrhythmias. It allows your health care provider to track your heart activity for several days, if needed. °· Stress tests by exercise or by giving medicine that makes the heart beat faster. °TREATMENT  °· Treatment depends on what may be causing your chest pain. Treatment may include: °· Acid blockers for  heartburn. °· Anti-inflammatory medicine. °· Pain medicine for inflammatory conditions. °· Antibiotics if an infection is present. °· You may be advised to change lifestyle habits. This includes stopping smoking and avoiding alcohol, caffeine, and chocolate. °· You may be advised to keep your head raised (elevated) when sleeping. This reduces the chance of acid going backward from your stomach into your esophagus. °Most of the time, nonspecific chest pain will improve within 2-3 days with rest and mild pain medicine.  °HOME CARE INSTRUCTIONS  °· If antibiotics were prescribed, take them as directed. Finish them even if you start to feel better. °· For the next few days, avoid physical activities that bring on chest pain. Continue physical activities as directed. °· Do not use any tobacco products, including cigarettes, chewing tobacco, or electronic cigarettes. °· Avoid drinking alcohol. °· Only take medicine as directed by your health care provider. °· Follow your health care provider's suggestions for further testing if your chest pain does not go away. °· Keep any follow-up appointments you made. If you do not go to an appointment, you could develop lasting (chronic) problems with pain. If there is any problem keeping an appointment, call to reschedule. °SEEK MEDICAL CARE IF:  °· Your chest pain does not go away, even after treatment. °· You have a rash with blisters on your chest. °· You have a fever. °SEEK IMMEDIATE MEDICAL CARE IF:  °· You have increased chest pain or pain that spreads to your arm, neck, jaw, back, or abdomen. °· You have shortness of breath. °· You have an increasing cough, or you cough   up blood. °· You have severe back or abdominal pain. °· You feel nauseous or vomit. °· You have severe weakness. °· You faint. °· You have chills. °This is an emergency. Do not wait to see if the pain will go away. Get medical help at once. Call your local emergency services (911 in [place] Do not drive  yourself to the hospital. °MAKE SURE YOU:  °· Understand these instructions. °· Will watch your condition. °· Will get help right away if you are not doing well or get worse. °Document Released: 11/22/2004 Document Revised: 02/17/2013 Document Reviewed: 09/18/2007 °ExitCare® Patient Information ©2015 [employer], LLC. This information is not intended to replace advice given to you by your health care provider. Make sure you discuss any questions you have with your health care provider. ° °Chest Wall Pain °Chest wall pain is pain in or around the bones and muscles of your chest. It may take up to 6 weeks to get better. It may take longer if you must stay physically active in your work and activities.  °CAUSES  °Chest wall pain may happen on its own. However, it may be caused by: °· A viral illness like the flu. °· Injury. °· Coughing. °· Exercise. °· Arthritis. °· Fibromyalgia. °· Shingles. °HOME CARE INSTRUCTIONS  °· Avoid overtiring physical activity. Try not to strain or perform activities that cause pain. This includes any activities using your chest or your abdominal and side muscles, especially if heavy weights are used. °· Put ice on the sore area. °¨ Put ice in a plastic bag. °¨ Place a towel between your skin and the bag. °¨ Leave the ice on for 15-20 minutes per hour while awake for the first 2 days. °· Only take over-the-counter or prescription medicines for pain, discomfort, or fever as directed by your caregiver. °SEEK IMMEDIATE MEDICAL CARE IF:  °· Your pain increases, or you are very uncomfortable. °· You have a fever. °· Your chest pain becomes worse. °· You have new, unexplained symptoms. °· You have nausea or vomiting. °· You feel sweaty or lightheaded. °· You have a cough with phlegm (sputum), or you cough up blood. °MAKE SURE YOU:  °· Understand these instructions. °· Will watch your condition. °· Will get help right away if you are not doing well or get worse. °Document Released: 02/12/2005 Document  Revised: 05/07/2011 Document Reviewed: 10/09/2010 °ExitCare® Patient Information ©2015 [employer], LLC. This information is not intended to replace advice given to you by your health care provider. Make sure you discuss any questions you have with your health care provider. ° °

## 2014-02-12 ENCOUNTER — Ambulatory Visit: Payer: Self-pay | Admitting: Internal Medicine

## 2014-02-17 ENCOUNTER — Other Ambulatory Visit: Payer: Self-pay | Admitting: Emergency Medicine

## 2014-02-17 MED ORDER — HYDROCHLOROTHIAZIDE 25 MG PO TABS: 25.0000 mg | ORAL_TABLET | Freq: Every morning | ORAL | Status: DC

## 2014-02-20 ENCOUNTER — Emergency Department (HOSPITAL_COMMUNITY)
Admission: EM | Admit: 2014-02-20 | Discharge: 2014-02-20 | Discharge status: Against Medical Advice | Payer: Self-pay | Attending: Emergency Medicine | Admitting: Emergency Medicine

## 2014-02-20 ENCOUNTER — Encounter (HOSPITAL_COMMUNITY): Payer: Self-pay | Admitting: Emergency Medicine

## 2014-02-20 DIAGNOSIS — J45909 Unspecified asthma, uncomplicated: Secondary | ICD-10-CM | POA: Insufficient documentation

## 2014-02-20 DIAGNOSIS — K088 Other specified disorders of teeth and supporting structures: Secondary | ICD-10-CM | POA: Insufficient documentation

## 2014-02-20 DIAGNOSIS — I10 Essential (primary) hypertension: Secondary | ICD-10-CM | POA: Insufficient documentation

## 2014-02-20 DIAGNOSIS — G43909 Migraine, unspecified, not intractable, without status migrainosus: Secondary | ICD-10-CM | POA: Insufficient documentation

## 2014-02-20 NOTE — ED Notes (Signed)
Pt c/o migraine and hole in left lower posterior tooth that have been going on for over month.  Pt doesn't know if the left sided migraine is related to the tooth or not.

## 2014-03-18 ENCOUNTER — Emergency Department (HOSPITAL_COMMUNITY): Admission: EM | Admit: 2014-03-18 | Discharge: 2014-03-18 | Discharge status: Against Medical Advice | Payer: Self-pay

## 2014-03-18 NOTE — ED Notes (Signed)
Pt was not in the room when this nurse went in to assess pt.  Walter Howell Secretary reports pt had called stating he left because he was waiting too long to be seen.  He was made aware that he has only been in waiting for 18 minutes when he called.

## 2014-04-03 ENCOUNTER — Encounter (HOSPITAL_COMMUNITY): Payer: Self-pay | Admitting: Emergency Medicine

## 2014-04-03 ENCOUNTER — Emergency Department (HOSPITAL_COMMUNITY)
Admission: EM | Admit: 2014-04-03 | Discharge: 2014-04-03 | Disposition: A | Discharge status: Home / Self Care | Payer: Self-pay | Attending: Emergency Medicine | Admitting: Emergency Medicine

## 2014-04-03 DIAGNOSIS — Z87891 Personal history of nicotine dependence: Secondary | ICD-10-CM | POA: Insufficient documentation

## 2014-04-03 DIAGNOSIS — I10 Essential (primary) hypertension: Secondary | ICD-10-CM | POA: Insufficient documentation

## 2014-04-03 DIAGNOSIS — F419 Anxiety disorder, unspecified: Secondary | ICD-10-CM | POA: Insufficient documentation

## 2014-04-03 DIAGNOSIS — K219 Gastro-esophageal reflux disease without esophagitis: Secondary | ICD-10-CM | POA: Insufficient documentation

## 2014-04-03 DIAGNOSIS — K088 Other specified disorders of teeth and supporting structures: Secondary | ICD-10-CM | POA: Insufficient documentation

## 2014-04-03 DIAGNOSIS — K0889 Other specified disorders of teeth and supporting structures: Secondary | ICD-10-CM

## 2014-04-03 DIAGNOSIS — J45909 Unspecified asthma, uncomplicated: Secondary | ICD-10-CM | POA: Insufficient documentation

## 2014-04-03 DIAGNOSIS — Z79899 Other long term (current) drug therapy: Secondary | ICD-10-CM | POA: Insufficient documentation

## 2014-04-03 MED ORDER — ONDANSETRON 8 MG PO TBDP
8.0000 mg | ORAL_TABLET | Freq: Once | ORAL | Status: AC
  Administered 2014-04-03: 8 mg via ORAL
  Filled 2014-04-03: qty 1

## 2014-04-03 MED ORDER — NAPROXEN 500 MG PO TABS: 500.0000 mg | ORAL_TABLET | Freq: Two times a day (BID) | ORAL | Status: DC

## 2014-04-03 MED ORDER — HYDROCODONE-ACETAMINOPHEN 5-325 MG PO TABS: 1.0000 | ORAL_TABLET | ORAL | Status: DC | PRN

## 2014-04-03 MED ORDER — BUPIVACAINE-EPINEPHRINE (PF) 0.5% -1:200000 IJ SOLN
1.8000 mL | Freq: Once | INTRAMUSCULAR | Status: DC
  Filled 2014-04-03: qty 1.8

## 2014-04-03 MED ORDER — PENICILLIN V POTASSIUM 500 MG PO TABS: 500.0000 mg | ORAL_TABLET | Freq: Four times a day (QID) | ORAL | Status: AC

## 2014-04-03 NOTE — ED Provider Notes (Signed)
CSN: [id number]     Arrival date & time 04/03/14  1019 History   First MD Initiated Contact with Patient 04/03/14 1022     Chief Complaint  Patient presents with  . Dental Pain   (Consider location/radiation/quality/duration/timing/severity/associated sxs/prior Treatment) HPI Walter Howell is a 29 yo male presenting with report recurrent dental pain.  This pain has been ongoing for for several months and reports difficulty with follow-up with a dentist due to transportation issues. He reports the tooth pain is making his head hurt. He describes the pain as aching and 10/10.   He states he has had chills and subjective fevers and nausea but denies vomiting or facial swelling.    Past Medical History  Diagnosis Date  . Asthma   . Hypertension   . Anxiety   . GERD (gastroesophageal reflux disease)    History reviewed. No pertinent past surgical history. Family History  Problem Relation Age of Onset  . Diabetes Maternal Grandmother   . Hypertension Maternal Grandmother   . Diabetes Maternal Grandfather   . Diabetes Paternal Grandmother   . Heart disease Paternal Grandmother   . Diabetes Paternal Grandfather    History  Substance Use Topics  . Smoking status: Former Games developermoker  . Smokeless tobacco: Not on file  . Alcohol Use: No    Review of Systems  Constitutional: Positive for chills.  HENT: Positive for dental problem. Negative for drooling and facial swelling.   Gastrointestinal: Positive for nausea. Negative for vomiting.      Allergies  Review of patient's allergies indicates no known allergies.  Home Medications   Prior to Admission medications   Medication Sig Start Date End Date Taking? Authorizing Provider  acetaminophen (TYLENOL) 500 MG tablet Take 1,000 mg by mouth every 6 (six) hours as needed for moderate pain or headache.     Historical Provider, MD  albuterol (PROVENTIL HFA;VENTOLIN HFA) 108 (90 BASE) MCG/ACT inhaler Inhale 2 puffs into the lungs every  6 (six) hours as needed for wheezing or shortness of breath.     Historical Provider, MD  hydrochlorothiazide (HYDRODIURIL) 25 MG tablet Take 1 tablet (25 mg total) by mouth every morning. 02/17/14   Doris Cheadleeepak Advani, MD  naproxen (NAPROSYN) 500 MG tablet Take 1 tablet (500 mg total) by mouth 2 (two) times daily. Patient not taking: Reported on 02/10/2014 12/17/13   Earle GellBenjamin W Cartner, PA-C  omeprazole (PRILOSEC) 20 MG capsule Take 1 capsule (20 mg total) by mouth daily. 12/17/13   Sharlene MottsBenjamin W Cartner, PA-C  oxyCODONE-acetaminophen (PERCOCET/ROXICET) 5-325 MG per tablet Take 1-2 tablets by mouth every 4 (four) hours as needed for severe pain. 02/10/14   Toy BakerAnthony T Allen, MD  penicillin v potassium (VEETID) 500 MG tablet Take 1 tablet (500 mg total) by mouth 3 (three) times daily. Patient not taking: Reported on 02/10/2014 01/07/14   Fayrene HelperBowie Tran, PA-C  predniSONE (DELTASONE) 10 MG tablet Take 2 tablets (20 mg total) by mouth daily. 02/10/14   Toy BakerAnthony T Allen, MD  traMADol (ULTRAM) 50 MG tablet Take 1 tablet (50 mg total) by mouth every 6 (six) hours as needed. Patient not taking: Reported on 02/10/2014 01/07/14   Fayrene HelperBowie Tran, PA-C   BP 159/96 mmHg  Pulse 68  Temp(Src) 98.8 F (37.1 C) (Oral)  Resp 16  SpO2 95% Physical Exam  Constitutional: He appears well-developed and well-nourished. No distress.  HENT:  Head: Normocephalic and atraumatic.  Mouth/Throat: No trismus in the jaw. No dental abscesses.    Eyes: Conjunctivae  are normal. Right eye exhibits no discharge. Left eye exhibits no discharge. No scleral icterus.  Cardiovascular: Intact distal pulses.   Pulmonary/Chest: Effort normal.  Neurological: He is alert. Coordination normal.  Skin: Skin is warm and dry. He is not diaphoretic.  Nursing note and vitals reviewed.   ED Course  Procedures (including critical care time) NERVE BLOCK Performed by: Harle Battiest Consent: Verbal consent obtained. Required items: required blood  products, implants, devices, and special equipment available Time out: Immediately prior to procedure a "time out" was called to verify the correct patient, procedure, equipment, support staff and site/side marked as required.  Indication: dental pain Nerve block body site: anterior alveolar block  Preparation: Patient was prepped and draped in the usual sterile fashion. Needle gauge: 27 G Location technique: anatomical landmarks  Local anesthetic: bupivicaine with epi  Anesthetic total: 2 ml  Outcome: pain improved Patient tolerance: Patient tolerated the procedure well with no immediate complications.  Labs Review Labs Reviewed - No data to display  Imaging Review No results found.   EKG Interpretation None      MDM   Final diagnoses:  None   29 yo with recurrent visits for dental pain.  He reports frequent use of tylenol q 1 hr for the several doses, so vicodin avoided in the ED. Dental block performed with pain resolved.  No gross abscess or concern for Ludwig's angina or spread of infection.  Discussed importance of follow-up with dentist and multiple resources provided. Prescription provided for penicillin and pain medicine.  Pt aqware of plan and in agreement.  Return precautions provided.     Filed Vitals:   04/03/14 1028  BP: 159/96  Pulse: 68  Temp: 98.8 F (37.1 C)  TempSrc: Oral  Resp: 16  SpO2: 95%   Meds given in ED:  Medications  bupivacaine-epinephrine (MARCAINE W/ EPI) 0.5% -1:200000 injection 1.8 mL (not administered)  ondansetron (ZOFRAN-ODT) disintegrating tablet 8 mg (not administered)    Discharge Medication List as of 04/03/2014 11:06 AM    START taking these medications   Details  HYDROcodone-acetaminophen (NORCO/VICODIN) 5-325 MG per tablet Take 1 tablet by mouth every 4 (four) hours as needed for moderate pain or severe pain., Starting 04/03/2014, Until Discontinued, Print    !! naproxen (NAPROSYN) 500 MG tablet Take 1 tablet (500 mg  total) by mouth 2 (two) times daily., Starting 04/03/2014, Until Discontinued, Print     !! - Potential duplicate medications found. Please discuss with provider.         Harle Battiest, NP 04/03/14 1122  Purvis Sheffield, MD 04/03/14 1536

## 2014-04-03 NOTE — ED Notes (Signed)
Pt c/o dental pain xfew months. 10/10 pain.

## 2014-04-03 NOTE — Discharge Instructions (Signed)
Please follow the directions provided. Be sure to use the resources to follow-up with the dentist for treatment of this tooth pain. Please take the naproxen twice a day for pain. You may take the Vicodin for pain not relieved by the naproxen. Please take the antibiotic as directed to prevent infection. Don't hesitate to return for any new, worsening, or concerning symptoms.   SEEK IMMEDIATE MEDICAL CARE IF:  You have a fever.  You develop redness and swelling of your face, jaw, or neck.  You are unable to open your mouth.  You have severe pain uncontrolled by pain medicine.    Emergency Department Resource Guide 1) Find a Doctor and Pay Out of Pocket Although you won't have to find out who is covered by your insurance plan, it is a good idea to ask around and get recommendations. You will then need to call the office and see if the doctor you have chosen will accept you as a new patient and what types of options they offer for patients who are self-pay. Some doctors offer discounts or will set up payment plans for their patients who do not have insurance, but you will need to ask so you aren't surprised when you get to your appointment.  2) Contact Your Local Health Department Not all health departments have doctors that can see patients for sick visits, but many do, so it is worth a call to see if yours does. If you don't know where your local health department is, you can check in your phone book. The CDC also has a tool to help you locate your state's health department, and many state websites also have listings of all of their local health departments.  3) Find a Walk-in Clinic If your illness is not likely to be very severe or complicated, you may want to try a walk in clinic. These are popping up all over the country in pharmacies, drugstores, and shopping centers. They're usually staffed by nurse practitioners or physician assistants that have been trained to treat common illnesses and  complaints. They're usually fairly quick and inexpensive. However, if you have serious medical issues or chronic medical problems, these are probably not your best option.  No Primary Care Doctor: - Call Health Connect at  [phone] - they can help you locate a primary care doctor that  accepts your insurance, provides certain services, etc. - Physician Referral [phone]- [phone]   Dental Care: Organization         Address  Phone  Notes  [facility] [address], [place] [phone] Accepts children up to age 28 who are enrolled in [place] or [place] Health Choice; pregnant women with a Medicaid card; and children who have applied for Medicaid or [place] Health Choice, but were declined, whose parents can pay a reduced fee at time of service.  [facility] [place]  [address], [place] [phone] Accepts children up to age 28 who are enrolled in [place] or [place] Health Choice; pregnant women with a Medicaid card; and children who have applied for Medicaid or [place] Health Choice, but were declined, whose parents can pay a reduced fee at time of service.  [facility]  [address], [place] [phone] Patients are seen by appointment only. Walk-ins are not accepted. [facility] will see patients 28 years of age and older. Monday - Tuesday (8am-5pm) Most Wednesdays (  8:30-5pm) $30 per visit, cash only  [facility]  [address], [facility] [phone] Patients are seen by appointment only. Walk-ins are not accepted. [facility] will see patients 16 years of age and older. One Wednesday Evening (Monthly: Volunteer Based).  $30 per visit, cash only  [employer]  [phone] for adults; Children under age 48, call Graduate Pediatric Dentistry at [phone]. Children aged 34-14, please call  [phone] to request a pediatric application.  Dental services are provided in all areas of dental care including fillings, crowns and bridges, complete and partial dentures, implants, gum treatment, root canals, and extractions. Preventive care is also provided. Treatment is provided to both adults and children. Patients are selected via a lottery and there is often a waiting list.   [facility] [address], [place]  [phone] [email or web address]   [facility] [address] [place], [place] [phone], Ext. [phone] Second and Fourth Thursday of each month, opens at 6:30 AM; Clinic ends at 9 AM.  Patients are seen on a first-come first-served basis, and a limited number are seen during each clinic.   [facility]  [address] [place], [place] [phone]   Eligibility Requirements You must have lived in [place], [place], or [place] counties for at least the last three months.   You cannot be eligible for state or federal sponsored [place], including [employer], [place], or [employer].   You generally cannot be eligible for healthcare insurance through your employer.    How to apply: Eligibility screenings are held every Tuesday and Wednesday afternoon from 1:00 pm until 4:00 pm. You do not need an appointment for the interview!  [facility] [address] [place], [place] [phone]   [facility]  [phone]   [facility]  [phone]   [facility]  [phone]

## 2014-04-14 ENCOUNTER — Encounter: Payer: Self-pay | Admitting: Internal Medicine

## 2014-04-14 ENCOUNTER — Ambulatory Visit: Payer: Self-pay | Attending: Internal Medicine | Admitting: Internal Medicine

## 2014-04-14 VITALS — BP 154/97 | HR 74 | Temp 97.9°F | Resp 16 | Ht 72.0 in | Wt 275.0 lb

## 2014-04-14 DIAGNOSIS — K0889 Other specified disorders of teeth and supporting structures: Secondary | ICD-10-CM

## 2014-04-14 DIAGNOSIS — Z113 Encounter for screening for infections with a predominantly sexual mode of transmission: Secondary | ICD-10-CM

## 2014-04-14 DIAGNOSIS — I10 Essential (primary) hypertension: Secondary | ICD-10-CM

## 2014-04-14 DIAGNOSIS — K088 Other specified disorders of teeth and supporting structures: Secondary | ICD-10-CM

## 2014-04-14 LAB — COMPLETE METABOLIC PANEL WITH GFR
ALT: 58 U/L — ABNORMAL HIGH (ref 0–53)
AST: 30 U/L (ref 0–37)
Albumin: 4.4 g/dL (ref 3.5–5.2)
Alkaline Phosphatase: 43 U/L (ref 39–117)
BILIRUBIN TOTAL: 0.8 mg/dL (ref 0.2–1.2)
BUN: 15 mg/dL (ref 6–23)
CALCIUM: 9.9 mg/dL (ref 8.4–10.5)
CHLORIDE: 102 meq/L (ref 96–112)
CO2: 28 mEq/L (ref 19–32)
CREATININE: 0.88 mg/dL (ref 0.50–1.35)
GFR, Est African American: >89 mL/min
GFR, Est Non African American: >89 mL/min
Glucose, Bld: 100 mg/dL — ABNORMAL HIGH (ref 70–99)
POTASSIUM: 4.3 meq/L (ref 3.5–5.3)
Sodium: 141 mEq/L (ref 135–145)
Total Protein: 7.3 g/dL (ref 6.0–8.3)

## 2014-04-14 MED ORDER — TRAMADOL HCL 50 MG PO TABS: 50.0000 mg | ORAL_TABLET | Freq: Two times a day (BID) | ORAL | Status: DC | PRN

## 2014-04-14 MED ORDER — AMLODIPINE BESYLATE 5 MG PO TABS: 5.0000 mg | ORAL_TABLET | Freq: Every day | ORAL | Status: DC

## 2014-04-14 MED ORDER — IBUPROFEN 800 MG PO TABS: 800.0000 mg | ORAL_TABLET | Freq: Three times a day (TID) | ORAL | Status: DC | PRN

## 2014-04-14 NOTE — Patient Instructions (Signed)
DASH Eating Plan °DASH stands for "Dietary Approaches to Stop Hypertension." The DASH eating plan is a healthy eating plan that has been shown to reduce high blood pressure (hypertension). Additional health benefits may include reducing the risk of type 2 diabetes mellitus, heart disease, and stroke. The DASH eating plan may also help with weight loss. °WHAT DO I NEED TO KNOW ABOUT THE DASH EATING PLAN? °For the DASH eating plan, you will follow these general guidelines: °· Choose foods with a percent daily value for sodium of less than 5% (as listed on the food label). °· Use salt-free seasonings or herbs instead of table salt or sea salt. °· Check with your health care provider or pharmacist before using salt substitutes. °· Eat lower-sodium products, often labeled as "lower sodium" or "no salt added." °· Eat fresh foods. °· Eat more vegetables, fruits, and low-fat dairy products. °· Choose whole grains. Look for the word "whole" as the first word in the ingredient list. °· Choose fish and skinless chicken or turkey more often than red meat. Limit fish, poultry, and meat to 6 oz (170 g) each day. °· Limit sweets, desserts, sugars, and sugary drinks. °· Choose heart-healthy fats. °· Limit cheese to 1 oz (28 g) per day. °· Eat more home-cooked food and less restaurant, buffet, and fast food. °· Limit fried foods. °· Cook foods using methods other than frying. °· Limit canned vegetables. If you do use them, rinse them well to decrease the sodium. °· When eating at a restaurant, ask that your food be prepared with less salt, or no salt if possible. °WHAT FOODS CAN I EAT? °Seek help from a dietitian for individual calorie needs. °Grains °Whole grain or whole wheat bread. Brown rice. Whole grain or whole wheat pasta. Quinoa, bulgur, and whole grain cereals. Low-sodium cereals. Corn or whole wheat flour tortillas. Whole grain cornbread. Whole grain crackers. Low-sodium crackers. °Vegetables °Fresh or frozen vegetables  (raw, steamed, roasted, or grilled). Low-sodium or reduced-sodium tomato and vegetable juices. Low-sodium or reduced-sodium tomato sauce and paste. Low-sodium or reduced-sodium canned vegetables.  °Fruits °All fresh, canned (in natural juice), or frozen fruits. °Meat and Other Protein Products °Ground beef (85% or leaner), grass-fed beef, or beef trimmed of fat. Skinless chicken or turkey. Ground chicken or turkey. Pork trimmed of fat. All fish and seafood. Eggs. Dried beans, peas, or lentils. Unsalted nuts and seeds. Unsalted canned beans. °Dairy °Low-fat dairy products, such as skim or 1% milk, 2% or reduced-fat cheeses, low-fat ricotta or cottage cheese, or plain low-fat yogurt. Low-sodium or reduced-sodium cheeses. °Fats and Oils °Tub margarines without trans fats. Light or reduced-fat mayonnaise and salad dressings (reduced sodium). Avocado. Safflower, olive, or canola oils. Natural peanut or almond butter. °Other °Unsalted popcorn and pretzels. °The items listed above may not be a complete list of recommended foods or beverages. Contact your dietitian for more options. °WHAT FOODS ARE NOT RECOMMENDED? °Grains °White bread. White pasta. White rice. Refined cornbread. Bagels and croissants. Crackers that contain trans fat. °Vegetables °Creamed or fried vegetables. Vegetables in a cheese sauce. Regular canned vegetables. Regular canned tomato sauce and paste. Regular tomato and vegetable juices. °Fruits °Dried fruits. Canned fruit in light or heavy syrup. Fruit juice. °Meat and Other Protein Products °Fatty cuts of meat. Ribs, chicken wings, bacon, sausage, bologna, salami, chitterlings, fatback, hot dogs, bratwurst, and packaged luncheon meats. Salted nuts and seeds. Canned beans with salt. °Dairy °Whole or 2% milk, cream, half-and-half, and cream cheese. Whole-fat or sweetened yogurt. Full-fat   cheeses or blue cheese. Nondairy creamers and whipped toppings. Processed cheese, cheese spreads, or cheese  curds. °Condiments °Onion and garlic salt, seasoned salt, table salt, and sea salt. Canned and packaged gravies. Worcestershire sauce. Tartar sauce. Barbecue sauce. Teriyaki sauce. Soy sauce, including reduced sodium. Steak sauce. Fish sauce. Oyster sauce. Cocktail sauce. Horseradish. Ketchup and mustard. Meat flavorings and tenderizers. Bouillon cubes. Hot sauce. Tabasco sauce. Marinades. Taco seasonings. Relishes. °Fats and Oils °Butter, stick margarine, lard, shortening, ghee, and bacon fat. Coconut, palm kernel, or palm oils. Regular salad dressings. °Other °Pickles and olives. Salted popcorn and pretzels. °The items listed above may not be a complete list of foods and beverages to avoid. Contact your dietitian for more information. °WHERE CAN I FIND MORE INFORMATION? °National Heart, Lung, and Blood Institute: www.nhlbi.nih.gov/health/health-topics/topics/dash/ °Document Released: 02/01/2011 Document Revised: 06/29/2013 Document Reviewed: 12/17/2012 °ExitCare® Patient Information ©2015 [employer]. This information is not intended to replace advice given to you by your health care provider. Make sure you discuss any questions you have with your health care provider. ° °

## 2014-04-14 NOTE — Progress Notes (Signed)
MRN: [id number] Name: Walter Howell  Sex: male Age: 29 y.o. DOB: 10-31-1985  Allergies: Review of patient's allergies indicates no known allergies.  Chief Complaint  Patient presents with  . Dental Pain  . Medication Refill    HPI: Patient is 29 y.o. male who has history of hypertension, recently patient went to the emergency room with the symptoms of dental pain, as per patient he got antibiotic which she already finished her, still complaining of pain patient has already been referred to dentist in the past as per patient he is in the waiting list, is requesting some pain medication, also his blood pressure today is elevated as per patient he is taking hydrochlorothiazide but he reported to have been urinating more often, he also wants to be checked for STDs.  Past Medical History  Diagnosis Date  . Asthma   . Hypertension   . Anxiety   . GERD (gastroesophageal reflux disease)     History reviewed. No pertinent past surgical history.    Medication List       This list is accurate as of: 04/14/14  3:11 PM.  Always use your most recent med list.               acetaminophen 500 MG tablet  Commonly known as:  TYLENOL  Take 1,000 mg by mouth every 6 (six) hours as needed for moderate pain or headache.     albuterol 108 (90 BASE) MCG/ACT inhaler  Commonly known as:  PROVENTIL HFA;VENTOLIN HFA  Inhale 2 puffs into the lungs every 6 (six) hours as needed for wheezing or shortness of breath.     amLODipine 5 MG tablet  Commonly known as:  NORVASC  Take 1 tablet (5 mg total) by mouth daily.     hydrochlorothiazide 25 MG tablet  Commonly known as:  HYDRODIURIL  Take 1 tablet (25 mg total) by mouth every morning.     HYDROcodone-acetaminophen 5-325 MG per tablet  Commonly known as:  NORCO/VICODIN  Take 1 tablet by mouth every 4 (four) hours as needed for moderate pain or severe pain.     ibuprofen 800 MG tablet  Commonly known as:  ADVIL,MOTRIN  Take 1  tablet (800 mg total) by mouth every 8 (eight) hours as needed.     naproxen 500 MG tablet  Commonly known as:  NAPROSYN  Take 1 tablet (500 mg total) by mouth 2 (two) times daily.     naproxen 500 MG tablet  Commonly known as:  NAPROSYN  Take 1 tablet (500 mg total) by mouth 2 (two) times daily.     omeprazole 20 MG capsule  Commonly known as:  PRILOSEC  Take 1 capsule (20 mg total) by mouth daily.     oxyCODONE-acetaminophen 5-325 MG per tablet  Commonly known as:  PERCOCET/ROXICET  Take 1-2 tablets by mouth every 4 (four) hours as needed for severe pain.     predniSONE 10 MG tablet  Commonly known as:  DELTASONE  Take 2 tablets (20 mg total) by mouth daily.     traMADol 50 MG tablet  Commonly known as:  ULTRAM  Take 1 tablet (50 mg total) by mouth every 12 (twelve) hours as needed.        Meds ordered this encounter  Medications  . amLODipine (NORVASC) 5 MG tablet    Sig: Take 1 tablet (5 mg total) by mouth daily.    Dispense:  90 tablet    Refill:  3  .  ibuprofen (ADVIL,MOTRIN) 800 MG tablet    Sig: Take 1 tablet (800 mg total) by mouth every 8 (eight) hours as needed.    Dispense:  30 tablet    Refill:  1  . traMADol (ULTRAM) 50 MG tablet    Sig: Take 1 tablet (50 mg total) by mouth every 12 (twelve) hours as needed.    Dispense:  20 tablet    Refill:  0     There is no immunization history on file for this patient.  Family History  Problem Relation Age of Onset  . Diabetes Maternal Grandmother   . Hypertension Maternal Grandmother   . Diabetes Maternal Grandfather   . Diabetes Paternal Grandmother   . Heart disease Paternal Grandmother   . Diabetes Paternal Grandfather     History  Substance Use Topics  . Smoking status: Former Games developer  . Smokeless tobacco: Not on file  . Alcohol Use: No    Review of Systems   As noted in HPI  Filed Vitals:   04/14/14 1440  BP: 154/97  Pulse: 74  Temp: 97.9 F (36.6 C)  Resp: 16    Physical  Exam  Physical Exam  Constitutional: No distress.  HENT:  Left lower jaw molar dental cavities  Eyes: EOM are normal. Pupils are equal, round, and reactive to light.  Cardiovascular: Normal rate and regular rhythm.   Pulmonary/Chest: Breath sounds normal. No respiratory distress. He has no wheezes. He has no rales.  Musculoskeletal: He exhibits no edema.    CBC    Component Value Date/Time   WBC 5.6 02/10/2014 1253   RBC 5.19 02/10/2014 1253   HGB 16.4 02/10/2014 1253   HCT 47.1 02/10/2014 1253   PLT 202 02/10/2014 1253   MCV 90.8 02/10/2014 1253   LYMPHSABS 1.7 12/17/2013 1632   MONOABS 0.2 12/17/2013 1632   EOSABS 0.1 12/17/2013 1632   BASOSABS 0.0 12/17/2013 1632    CMP     Component Value Date/Time   NA 138 02/10/2014 1253   K 4.0 02/10/2014 1253   CL 99 02/10/2014 1253   CO2 26 02/10/2014 1253   GLUCOSE 105* 02/10/2014 1253   BUN 17 02/10/2014 1253   CREATININE 0.89 02/10/2014 1253   CREATININE 1.09 07/08/2013 1618   CALCIUM 9.6 02/10/2014 1253   PROT 7.6 12/17/2013 1632   ALBUMIN 4.1 12/17/2013 1632   AST 24 12/17/2013 1632   ALT 41 12/17/2013 1632   ALKPHOS 43 12/17/2013 1632   BILITOT 0.6 12/17/2013 1632   GFRNONAA >90 02/10/2014 1253   GFRNONAA >89 07/08/2013 1618   GFRAA >90 02/10/2014 1253   GFRAA >89 07/08/2013 1618    No results found for: CHOL  No components found for: HGA1C  Lab Results  Component Value Date/Time   AST 24 12/17/2013 04:32 PM    Assessment and Plan  Pain, dental - Plan: ibuprofen (ADVIL,MOTRIN) 800 MG tablet, traMADol (ULTRAM) 50 MG tablet, patient has already finished a course of antibiotic, he will schedule appointment with dentist.  Essential hypertension, benign - Plan: have discontinued hydrochlorothiazide, started patient on amLODipine (NORVASC) 5 MG tablet, also advise patient for DASH diet,COMPLETE METABOLIC PANEL WITH GFR  Screen for STD (sexually transmitted disease) - Plan: GC/chlamydia probe amp, urine, HIV  antibody (with reflex)   Health Maintenance  -Vaccinations:  Patient declined flu shot  Return in about 3 months (around 07/13/2014) for hypertension.   This note has been created with Education officer, environmental. Any  transcriptional errors are unintentional.    Lorayne Marek, MD

## 2014-04-14 NOTE — Progress Notes (Signed)
Medication refill Lower left tooth pain patient requesting medicine for pain Std testing

## 2014-04-15 LAB — HIV ANTIBODY (ROUTINE TESTING W REFLEX): HIV: NONREACTIVE

## 2014-04-15 LAB — GC/CHLAMYDIA PROBE AMP, URINE
Chlamydia, Swab/Urine, PCR: NEGATIVE
GC Probe Amp, Urine: NEGATIVE

## 2014-04-16 ENCOUNTER — Telehealth: Payer: Self-pay

## 2014-04-16 NOTE — Telephone Encounter (Signed)
-----   Message from Doris Cheadleeepak Advani, MD sent at 04/15/2014  5:23 PM EST ----- Call and let the patient know that his STD test including HIV/GC and chlamydia came back negative

## 2014-04-16 NOTE — Telephone Encounter (Signed)
Patient not available Left message on voice mail to return our call 

## 2014-04-16 NOTE — Telephone Encounter (Signed)
Patient is aware of his lab results 

## 2014-04-28 ENCOUNTER — Emergency Department (HOSPITAL_COMMUNITY)
Admission: EM | Admit: 2014-04-28 | Discharge: 2014-04-29 | Disposition: A | Discharge status: Home / Self Care | Payer: Self-pay | Attending: Emergency Medicine | Admitting: Emergency Medicine

## 2014-04-28 ENCOUNTER — Encounter (HOSPITAL_COMMUNITY): Payer: Self-pay | Admitting: *Deleted

## 2014-04-28 DIAGNOSIS — R0789 Other chest pain: Secondary | ICD-10-CM | POA: Insufficient documentation

## 2014-04-28 DIAGNOSIS — K0889 Other specified disorders of teeth and supporting structures: Secondary | ICD-10-CM

## 2014-04-28 DIAGNOSIS — K088 Other specified disorders of teeth and supporting structures: Secondary | ICD-10-CM | POA: Insufficient documentation

## 2014-04-28 DIAGNOSIS — K219 Gastro-esophageal reflux disease without esophagitis: Secondary | ICD-10-CM | POA: Insufficient documentation

## 2014-04-28 DIAGNOSIS — Z87891 Personal history of nicotine dependence: Secondary | ICD-10-CM | POA: Insufficient documentation

## 2014-04-28 DIAGNOSIS — F419 Anxiety disorder, unspecified: Secondary | ICD-10-CM | POA: Insufficient documentation

## 2014-04-28 DIAGNOSIS — Z791 Long term (current) use of non-steroidal anti-inflammatories (NSAID): Secondary | ICD-10-CM | POA: Insufficient documentation

## 2014-04-28 DIAGNOSIS — K029 Dental caries, unspecified: Secondary | ICD-10-CM | POA: Insufficient documentation

## 2014-04-28 DIAGNOSIS — Z79899 Other long term (current) drug therapy: Secondary | ICD-10-CM | POA: Insufficient documentation

## 2014-04-28 DIAGNOSIS — J45909 Unspecified asthma, uncomplicated: Secondary | ICD-10-CM | POA: Insufficient documentation

## 2014-04-28 DIAGNOSIS — R51 Headache: Secondary | ICD-10-CM | POA: Insufficient documentation

## 2014-04-28 DIAGNOSIS — I10 Essential (primary) hypertension: Secondary | ICD-10-CM | POA: Insufficient documentation

## 2014-04-28 DIAGNOSIS — R079 Chest pain, unspecified: Secondary | ICD-10-CM

## 2014-04-28 LAB — BASIC METABOLIC PANEL
ANION GAP: 8 (ref 5–15)
BUN: 14 mg/dL (ref 6–23)
CALCIUM: 8.9 mg/dL (ref 8.4–10.5)
CO2: 25 mmol/L (ref 19–32)
Chloride: 107 mmol/L (ref 96–112)
Creatinine, Ser: 0.88 mg/dL (ref 0.50–1.35)
GFR calc Af Amer: >90 mL/min (ref >90)
GFR calc non Af Amer: >90 mL/min (ref >90)
GLUCOSE: 132 mg/dL — AB (ref 70–99)
Potassium: 3.4 mmol/L — ABNORMAL LOW (ref 3.5–5.1)
Sodium: 140 mmol/L (ref 135–145)

## 2014-04-28 LAB — CBC
HCT: 45 % (ref 39.0–52.0)
Hemoglobin: 15.2 g/dL (ref 13.0–17.0)
MCH: 30.8 pg (ref 26.0–34.0)
MCHC: 33.8 g/dL (ref 30.0–36.0)
MCV: 91.3 fL (ref 78.0–100.0)
PLATELETS: 199 10*3/uL (ref 150–400)
RBC: 4.93 MIL/uL (ref 4.22–5.81)
RDW: 13.6 % (ref 11.5–15.5)
WBC: 4.9 10*3/uL (ref 4.0–10.5)

## 2014-04-28 LAB — I-STAT TROPONIN, ED: Troponin i, poc: 0 ng/mL (ref 0.00–0.08)

## 2014-04-28 MED ORDER — OXYCODONE-ACETAMINOPHEN 5-325 MG PO TABS: 1.0000 | ORAL_TABLET | Freq: Four times a day (QID) | ORAL | Status: DC | PRN

## 2014-04-28 NOTE — ED Notes (Signed)
Pt refused EKG.   Pt stated that he is coming in for dental pain and did not want the EKG.

## 2014-04-28 NOTE — ED Notes (Signed)
Pt states that he believes that his headache is coming from a "rotten tooth"; pt states that the pain is to the left side of his face and radiates up to head

## 2014-04-28 NOTE — ED Provider Notes (Signed)
CSN: [id number]     Arrival date & time 04/28/14  2055 History   First MD Initiated Contact with Patient 04/28/14 2229     Chief Complaint  Patient presents with  . Chest Pain  . Dental Pain     (Consider location/radiation/quality/duration/timing/severity/associated sxs/prior Treatment) The history is provided by the patient and medical records. No language interpreter was used.     Walter Howell is a 29 y.o. male  with a hx of asthma, HTN, anxiety, GERD presents to the Emergency Department complaining of gradual, persistent, progressively worsening substernal chest pain onset 3 hours. Pt reports it is dull, achy rated at a 7/10 after waking from a nap.  No SOB or palpitations.  Pt reports he is not concerned about this pain.  Patient reports this is the same as previous chest wall pain episodes.  Pt reports his primary concern is his recurrent dental pain onset 2 mos ago.  Pt reports he had had a dental block and was referred to a dentist.  Pt reports he was unable to have the tooth extracted.  Pt reports pain is 9/10, sharp and stabbing with radiation into the left jaw and temple and creating a headache.  Pt reports no relief from the tramadol, ibuprofen and vicodin only helps slightly.  Pt reports associated nausea with intense pain.  Denies fever, chills, neck pain, neck stiffness, abd pain, vomiting, diarrhea, weakness, dizziness.      Past Medical History  Diagnosis Date  . Asthma   . Hypertension   . Anxiety   . GERD (gastroesophageal reflux disease)    History reviewed. No pertinent past surgical history. Family History  Problem Relation Age of Onset  . Diabetes Maternal Grandmother   . Hypertension Maternal Grandmother   . Diabetes Maternal Grandfather   . Diabetes Paternal Grandmother   . Heart disease Paternal Grandmother   . Diabetes Paternal Grandfather    History  Substance Use Topics  . Smoking status: Former Games developer  . Smokeless tobacco: Not on file  .  Alcohol Use: No    Review of Systems  Constitutional: Negative for fever, chills, diaphoresis, appetite change, fatigue and unexpected weight change.  HENT: Positive for dental problem and ear pain. Negative for drooling, facial swelling, mouth sores, nosebleeds, postnasal drip, rhinorrhea and trouble swallowing.   Eyes: Negative for pain, redness and visual disturbance.  Respiratory: Negative for cough, chest tightness, shortness of breath and wheezing.   Cardiovascular: Negative for chest pain.  Gastrointestinal: Negative for nausea, vomiting, abdominal pain, diarrhea and constipation.  Endocrine: Negative for polydipsia, polyphagia and polyuria.  Genitourinary: Negative for dysuria, urgency, frequency and hematuria.  Musculoskeletal: Negative for back pain, neck pain and neck stiffness.  Skin: Negative for color change and rash.  Allergic/Immunologic: Negative for immunocompromised state.  Neurological: Positive for headaches. Negative for syncope, weakness and light-headedness.  Hematological: Does not bruise/bleed easily.  Psychiatric/Behavioral: Negative for sleep disturbance. The patient is not nervous/anxious.   All other systems reviewed and are negative.     Allergies  Review of patient's allergies indicates no known allergies.  Home Medications   Prior to Admission medications   Medication Sig Start Date End Date Taking? Authorizing Provider  amLODipine (NORVASC) 5 MG tablet Take 1 tablet (5 mg total) by mouth daily. 04/14/14  Yes Doris Cheadle, MD  HYDROcodone-acetaminophen (NORCO/VICODIN) 5-325 MG per tablet Take 1 tablet by mouth every 4 (four) hours as needed for moderate pain or severe pain. 04/03/14  Yes Harle Battiest,  NP  ibuprofen (ADVIL,MOTRIN) 800 MG tablet Take 1 tablet (800 mg total) by mouth every 8 (eight) hours as needed. 04/14/14  Yes Doris Cheadleeepak Advani, MD  Multiple Vitamin (MULTIVITAMIN WITH MINERALS) TABS tablet Take 1 tablet by mouth daily.   Yes Historical  Provider, MD  ranitidine (ZANTAC) 150 MG tablet Take 150 mg by mouth 2 (two) times daily.   Yes Historical Provider, MD  traMADol (ULTRAM) 50 MG tablet Take 1 tablet (50 mg total) by mouth every 12 (twelve) hours as needed. 04/14/14  Yes Doris Cheadleeepak Advani, MD  albuterol (PROVENTIL HFA;VENTOLIN HFA) 108 (90 BASE) MCG/ACT inhaler Inhale 2 puffs into the lungs every 6 (six) hours as needed for wheezing or shortness of breath.     Historical Provider, MD  naproxen (NAPROSYN) 500 MG tablet Take 1 tablet (500 mg total) by mouth 2 (two) times daily. Patient not taking: Reported on 02/10/2014 12/17/13   Earle GellBenjamin W Cartner, PA-C  naproxen (NAPROSYN) 500 MG tablet Take 1 tablet (500 mg total) by mouth 2 (two) times daily. Patient not taking: Reported on 04/14/2014 04/03/14   Harle BattiestElizabeth Tysinger, NP  omeprazole (PRILOSEC) 20 MG capsule Take 1 capsule (20 mg total) by mouth daily. Patient not taking: Reported on 04/03/2014 12/17/13   Earle GellBenjamin W Cartner, PA-C  oxyCODONE-acetaminophen (PERCOCET) 5-325 MG per tablet Take 1 tablet by mouth every 6 (six) hours as needed. 04/28/14   Tera Pellicane, PA-C  predniSONE (DELTASONE) 10 MG tablet Take 2 tablets (20 mg total) by mouth daily. Patient not taking: Reported on 04/03/2014 02/10/14   Toy BakerAnthony T Allen, MD   BP 146/82 mmHg  Pulse 58  Temp(Src) 98 F (36.7 C) (Oral)  Resp 18  Ht 5\' 11"  (1.803 m)  Wt 275 lb (124.739 kg)  BMI 38.37 kg/m2  SpO2 98% Physical Exam  Constitutional: He appears well-developed and well-nourished. No distress.  Awake, alert, nontoxic appearance  HENT:  Head: Normocephalic and atraumatic.  Right Ear: Tympanic membrane, external ear and ear canal normal.  Left Ear: Tympanic membrane, external ear and ear canal normal.  Nose: Nose normal. Right sinus exhibits no maxillary sinus tenderness and no frontal sinus tenderness. Left sinus exhibits no maxillary sinus tenderness and no frontal sinus tenderness.  Mouth/Throat: Uvula is midline,  oropharynx is clear and moist and mucous membranes are normal. No oral lesions. Abnormal dentition. Dental caries present. No uvula swelling or lacerations. No oropharyngeal exudate, posterior oropharyngeal edema, posterior oropharyngeal erythema or tonsillar abscesses.  No gingival swelling, fluctuance or induration No gross abscess Toot #20 with large cavity Poor dentition throughout  Eyes: Conjunctivae are normal. Pupils are equal, round, and reactive to light. Right eye exhibits no discharge. Left eye exhibits no discharge. No scleral icterus.  Neck: Normal range of motion. Neck supple.  No stridor Handling secretions without difficulty No nuchal rigidity No cervical lymphadenopathy   Cardiovascular: Normal rate, regular rhythm, normal heart sounds and intact distal pulses.   Pulmonary/Chest: Effort normal and breath sounds normal. No respiratory distress. He has no wheezes.  Equal chest rise  Abdominal: Soft. Bowel sounds are normal. He exhibits no distension and no mass. There is no tenderness. There is no rebound and no guarding.  Musculoskeletal: Normal range of motion. He exhibits no edema.  Lymphadenopathy:    He has no cervical adenopathy.  Neurological: He is alert.  Speech is clear and goal oriented Moves extremities without ataxia  Skin: Skin is warm and dry. He is not diaphoretic. No erythema.  Psychiatric: He has a  normal mood and affect.  Nursing note and vitals reviewed.   ED Course  Procedures (including critical care time) Labs Review Labs Reviewed  BASIC METABOLIC PANEL - Abnormal; Notable for the following:    Potassium 3.4 (*)    Glucose, Bld 132 (*)    All other components within normal limits  CBC  I-STAT TROPOININ, ED    Imaging Review No results found.   EKG Interpretation   Date/Time:  Wednesday April 28 2014 23:40:07 EST Ventricular Rate:  53 PR Interval:  181 QRS Duration: 91 QT Interval:  374 QTC Calculation: 351 R Axis:   68 Text  Interpretation:  Sinus rhythm ST elev, probable normal early repol  pattern Baseline wander in lead(s) V6 since last tracing no significant  change Confirmed by Effie Shy  MD, ELLIOTT ([id number]) on 04/29/2014 12:12:06 AM      MDM   Final diagnoses:  Chest pain, unspecified chest pain type  Dental cavities  Pain, dental   [name] presents with chest pain x 3 hours PTA.  Pt without cardiac history and reports similar pain in the past which has been diagnosed as muscle pain.  HEART Score 1.  Pt is low risk for cardiac events.  ECG with repol abnormality, but no evidence of ischemia.  Troponin negative and labs reassuring.    Patient with toothache.  No gross abscess.  Exam unconcerning for Ludwig's angina or spread of infection.  Will treat with penicillin and pain medicine.  Urged patient to follow-up with dentist.    I have personally reviewed patient's vitals, nursing note and any pertinent labs or imaging.  I performed an undressed physical exam.    It has been determined that no acute conditions requiring further emergency intervention are present at this time. The patient/guardian have been advised of the diagnosis and plan. I reviewed all labs and imaging including any potential incidental findings. We have discussed signs and symptoms that warrant return to the ED and they are listed in the discharge instructions.    Vital signs are stable at discharge.   BP 146/82 mmHg  Pulse 58  Temp(Src) 98 F (36.7 C) (Oral)  Resp 18  Ht  (1.803 m)  Wt 275 lb (124.739 kg)  BMI 38.37 kg/m2  SpO2 98%      Dierdre Forth, PA-C 04/29/14 0043  Flint Melter, MD 04/29/14 [phone]

## 2014-04-28 NOTE — Discharge Instructions (Signed)
1. Medications: percocet, penicillin - already at home, usual home medications 2. Treatment: rest, drink plenty of fluids, take medications as prescribed 3. Follow Up: Please followup with dentistry within 1 week for discussion of your diagnoses and further evaluation after today's visit; if you do not have a primary care doctor use the resource guide provided to find one; Return to the ER for high fevers, difficulty breathing, difficulty swallowing or other concerning symptoms    Chest Pain (Nonspecific) It is often hard to give a diagnosis for the cause of chest pain. There is always a chance that your pain could be related to something serious, such as a heart attack or a blood clot in the lungs. You need to follow up with your doctor. HOME CARE  If antibiotic medicine was given, take it as directed by your doctor. Finish the medicine even if you start to feel better.  For the next few days, avoid activities that bring on chest pain. Continue physical activities as told by your doctor.  Do not use any tobacco products. This includes cigarettes, chewing tobacco, and e-cigarettes.  Avoid drinking alcohol.  Only take medicine as told by your doctor.  Follow your doctor's suggestions for more testing if your chest pain does not go away.  Keep all doctor visits you made. GET HELP IF:  Your chest pain does not go away, even after treatment.  You have a rash with blisters on your chest.  You have a fever. GET HELP RIGHT AWAY IF:   You have more pain or pain that spreads to your arm, neck, jaw, back, or belly (abdomen).  You have shortness of breath.  You cough more than usual or cough up blood.  You have very bad back or belly pain.  You feel sick to your stomach (nauseous) or throw up (vomit).  You have very bad weakness.  You pass out (faint).  You have chills. This is an emergency. Do not wait to see if the problems will go away. Call your local emergency services (911  in [place] Do not drive yourself to the hospital. MAKE SURE YOU:   Understand these instructions.  Will watch your condition.  Will get help right away if you are not doing well or get worse. Document Released: 08/01/2007 Document Revised: 02/17/2013 Document Reviewed: 08/01/2007 [facility] Patient Information 2015 [place], [place]. This information is not intended to replace advice given to you by your health care provider. Make sure you discuss any questions you have with your health care provider.

## 2014-04-28 NOTE — ED Notes (Signed)
PT states that he has had chest pain to substernal chest for 3 hrs; pt states that it is a throbbing pain; pt states that he also has a headache; pt denies any other sx at present; pt ambulatory and no signs or sx of distress; pt denies shortness of breath, nausea or diaphoresis

## 2014-05-10 ENCOUNTER — Ambulatory Visit: Payer: Self-pay

## 2014-06-04 ENCOUNTER — Encounter (HOSPITAL_COMMUNITY): Payer: Self-pay | Admitting: *Deleted

## 2014-06-04 ENCOUNTER — Emergency Department (HOSPITAL_COMMUNITY)
Admission: EM | Admit: 2014-06-04 | Discharge: 2014-06-04 | Disposition: A | Discharge status: Home / Self Care | Payer: Self-pay | Attending: Emergency Medicine | Admitting: Emergency Medicine

## 2014-06-04 DIAGNOSIS — R197 Diarrhea, unspecified: Secondary | ICD-10-CM | POA: Insufficient documentation

## 2014-06-04 DIAGNOSIS — Z79899 Other long term (current) drug therapy: Secondary | ICD-10-CM | POA: Insufficient documentation

## 2014-06-04 DIAGNOSIS — K219 Gastro-esophageal reflux disease without esophagitis: Secondary | ICD-10-CM | POA: Insufficient documentation

## 2014-06-04 DIAGNOSIS — Z87891 Personal history of nicotine dependence: Secondary | ICD-10-CM | POA: Insufficient documentation

## 2014-06-04 DIAGNOSIS — R6883 Chills (without fever): Secondary | ICD-10-CM | POA: Insufficient documentation

## 2014-06-04 DIAGNOSIS — R531 Weakness: Secondary | ICD-10-CM | POA: Insufficient documentation

## 2014-06-04 DIAGNOSIS — Z8659 Personal history of other mental and behavioral disorders: Secondary | ICD-10-CM | POA: Insufficient documentation

## 2014-06-04 DIAGNOSIS — I10 Essential (primary) hypertension: Secondary | ICD-10-CM | POA: Insufficient documentation

## 2014-06-04 DIAGNOSIS — J45909 Unspecified asthma, uncomplicated: Secondary | ICD-10-CM | POA: Insufficient documentation

## 2014-06-04 MED ORDER — ONDANSETRON 8 MG PO TBDP
8.0000 mg | ORAL_TABLET | Freq: Once | ORAL | Status: AC
  Administered 2014-06-04: 8 mg via ORAL
  Filled 2014-06-04: qty 1

## 2014-06-04 NOTE — ED Notes (Addendum)
Patient c/o N/D x3 days.  Patient endorses chills and feeling hot occasionally.  Patient also c/o weakness.  Patient denies pain.  Patients endorses "a little cough."  Patient denies headache.

## 2014-06-04 NOTE — ED Provider Notes (Signed)
CSN: [id number]     Arrival date & time 06/04/14  1302 History   First MD Initiated Contact with Patient 06/04/14 1306     Chief Complaint  Patient presents with  . Nausea  . Diarrhea  . Weakness   Patient is a 29 y.o. male presenting with diarrhea and weakness. The history is provided by the patient.  Diarrhea Quality:  Watery Severity:  Moderate Onset quality:  Gradual Number of episodes:  Multiple Duration:  3 days Timing:  Intermittent Progression:  Worsening Relieved by:  Nothing Worsened by:  Nothing tried Associated symptoms: abdominal pain and chills   Associated symptoms: no recent cough, no fever, no headaches and no vomiting   Risk factors: no sick contacts and no travel to endemic areas   Weakness Associated symptoms include abdominal pain. Pertinent negatives include no headaches.    Past Medical History  Diagnosis Date  . Asthma   . Hypertension   . Anxiety   . GERD (gastroesophageal reflux disease)    History reviewed. No pertinent past surgical history. Family History  Problem Relation Age of Onset  . Diabetes Maternal Grandmother   . Hypertension Maternal Grandmother   . Diabetes Maternal Grandfather   . Diabetes Paternal Grandmother   . Heart disease Paternal Grandmother   . Diabetes Paternal Grandfather    History  Substance Use Topics  . Smoking status: Former Games developer  . Smokeless tobacco: Not on file  . Alcohol Use: No    Review of Systems  Constitutional: Positive for chills. Negative for fever.  Respiratory: Negative for cough.   Gastrointestinal: Positive for abdominal pain and diarrhea. Negative for vomiting and blood in stool.  Neurological: Positive for weakness. Negative for headaches.  All other systems reviewed and are negative.     Allergies  Review of patient's allergies indicates no known allergies.  Home Medications   Prior to Admission medications   Medication Sig Start Date End Date Taking? Authorizing Provider   acetaminophen (TYLENOL) 500 MG tablet Take 1,000 mg by mouth every 6 (six) hours as needed for moderate pain or headache.   Yes Historical Provider, MD  albuterol (PROVENTIL HFA;VENTOLIN HFA) 108 (90 BASE) MCG/ACT inhaler Inhale 2 puffs into the lungs every 6 (six) hours as needed for wheezing or shortness of breath.    Yes Historical Provider, MD  amLODipine (NORVASC) 5 MG tablet Take 1 tablet (5 mg total) by mouth daily. 04/14/14  Yes Doris Cheadle, MD  Multiple Vitamin (MULTIVITAMIN WITH MINERALS) TABS tablet Take 1 tablet by mouth daily.   Yes Historical Provider, MD  ranitidine (ZANTAC) 150 MG tablet Take 150 mg by mouth 2 (two) times daily.   Yes Historical Provider, MD   BP 147/83 mmHg  Pulse 73  Temp(Src) 98.6 F (37 C) (Oral)  Resp 17  Ht  (1.803 m)  Wt 275 lb (124.739 kg)  BMI 38.37 kg/m2  SpO2 97% Physical Exam CONSTITUTIONAL: Well developed/well nourished HEAD: Normocephalic/atraumatic EYES: EOMI/PERRL. No icterus ENMT: Mucous membranes moist NECK: supple no meningeal signs SPINE/BACK:entire spine nontender CV: S1/S2 noted, no murmurs/rubs/gallops noted LUNGS: Lungs are clear to auscultation bilaterally, no apparent distress ABDOMEN: soft, nontender, no rebound or guarding, bowel sounds noted throughout abdomen NEURO: Pt is awake/alert/appropriate, moves all extremitiesx4.  No facial droop.   EXTREMITIES: pulses normal/equal, full ROM SKIN: warm, color normal PSYCH: no abnormalities of mood noted, alert and oriented to situation  ED Course  Procedures   Pt well appearing, taking PO, appears hydrated, suspect  viral diarrheal illness He is appropriate for d/c home  Medications  ondansetron (ZOFRAN-ODT) disintegrating tablet 8 mg (8 mg Oral Given 06/04/14 1319)    MDM   Final diagnoses:  Diarrhea    Nursing notes including past medical history and social history reviewed and considered in documentation     Zadie Rhineonald Lygia Olaes, MD 06/04/14 1404

## 2014-06-21 ENCOUNTER — Ambulatory Visit: Payer: Self-pay

## 2014-07-14 ENCOUNTER — Telehealth: Payer: Self-pay | Admitting: Internal Medicine

## 2014-07-14 NOTE — Telephone Encounter (Signed)
Patient called to request a med refill for his blood pressure medication, patient uses our pharmacy. Please f/u

## 2014-07-19 ENCOUNTER — Ambulatory Visit: Payer: Self-pay | Attending: Internal Medicine | Admitting: Internal Medicine

## 2014-07-19 ENCOUNTER — Encounter: Payer: Self-pay | Admitting: Internal Medicine

## 2014-07-19 VITALS — BP 138/84 | HR 78 | Temp 98.0°F | Resp 16 | Wt 268.8 lb

## 2014-07-19 DIAGNOSIS — I10 Essential (primary) hypertension: Secondary | ICD-10-CM | POA: Insufficient documentation

## 2014-07-19 DIAGNOSIS — Z139 Encounter for screening, unspecified: Secondary | ICD-10-CM

## 2014-07-19 DIAGNOSIS — Z87891 Personal history of nicotine dependence: Secondary | ICD-10-CM | POA: Insufficient documentation

## 2014-07-19 DIAGNOSIS — J45909 Unspecified asthma, uncomplicated: Secondary | ICD-10-CM | POA: Insufficient documentation

## 2014-07-19 DIAGNOSIS — K219 Gastro-esophageal reflux disease without esophagitis: Secondary | ICD-10-CM | POA: Insufficient documentation

## 2014-07-19 DIAGNOSIS — R0981 Nasal congestion: Secondary | ICD-10-CM | POA: Insufficient documentation

## 2014-07-19 DIAGNOSIS — Z7951 Long term (current) use of inhaled steroids: Secondary | ICD-10-CM | POA: Insufficient documentation

## 2014-07-19 LAB — COMPLETE METABOLIC PANEL WITH GFR
ALK PHOS: 44 U/L (ref 39–117)
ALT: 44 U/L (ref 0–53)
AST: 24 U/L (ref 0–37)
Albumin: 4.3 g/dL (ref 3.5–5.2)
BILIRUBIN TOTAL: 0.7 mg/dL (ref 0.2–1.2)
BUN: 13 mg/dL (ref 6–23)
CHLORIDE: 102 meq/L (ref 96–112)
CO2: 27 meq/L (ref 19–32)
CREATININE: 0.84 mg/dL (ref 0.50–1.35)
Calcium: 9.5 mg/dL (ref 8.4–10.5)
GFR, Est Non African American: >89 mL/min
Glucose, Bld: 92 mg/dL (ref 70–99)
Potassium: 5 mEq/L (ref 3.5–5.3)
Sodium: 138 mEq/L (ref 135–145)
TOTAL PROTEIN: 7 g/dL (ref 6.0–8.3)

## 2014-07-19 LAB — HEMOGLOBIN A1C
Hgb A1c MFr Bld: 5.5 % (ref <5.7)
Mean Plasma Glucose: 111 mg/dL (ref <117)

## 2014-07-19 MED ORDER — AMLODIPINE BESYLATE 5 MG PO TABS: 5.0000 mg | ORAL_TABLET | Freq: Every day | ORAL | Status: DC

## 2014-07-19 MED ORDER — FLUTICASONE PROPIONATE 50 MCG/ACT NA SUSP: 2.0000 | Freq: Every day | NASAL | Status: DC

## 2014-07-19 NOTE — Progress Notes (Signed)
MRN: [id number] Name: Walter Howell  Sex: male Age: 29 y.o. DOB: [birth year]  Allergies: Review of patient's allergies indicates no known allergies.  Chief Complaint  Patient presents with  . Follow-up    HPI: Patient is 29 y.o. male who has a strep hypertension comes today for followup ,  He is complaining of nasal congestion and postnasal drip denies any fever chills sore throat chest and shortness of breath, previous blood work reviewed occasionally his blood sugar level noted to be high, patient denies any family history of diabetes. Her  Past Medical History  Diagnosis Date  . Asthma   . Hypertension   . Anxiety   . GERD (gastroesophageal reflux disease)     History reviewed. No pertinent past surgical history.    Medication List       This list is accurate as of: 07/19/14 12:47 PM.  Always use your most recent med list.               acetaminophen 500 MG tablet  Commonly known as:  TYLENOL  Take 1,000 mg by mouth every 6 (six) hours as needed for moderate pain or headache.     albuterol 108 (90 BASE) MCG/ACT inhaler  Commonly known as:  PROVENTIL HFA;VENTOLIN HFA  Inhale 2 puffs into the lungs every 6 (six) hours as needed for wheezing or shortness of breath.     amLODipine 5 MG tablet  Commonly known as:  NORVASC  Take 1 tablet (5 mg total) by mouth daily.     fluticasone 50 MCG/ACT nasal spray  Commonly known as:  FLONASE  Place 2 sprays into both nostrils daily.     multivitamin with minerals Tabs tablet  Take 1 tablet by mouth daily.     ranitidine 150 MG tablet  Commonly known as:  ZANTAC  Take 150 mg by mouth 2 (two) times daily.        Meds ordered this encounter  Medications  . amLODipine (NORVASC) 5 MG tablet    Sig: Take 1 tablet (5 mg total) by mouth daily.    Dispense:  90 tablet    Refill:  3  . fluticasone (FLONASE) 50 MCG/ACT nasal spray    Sig: Place 2 sprays into both nostrils daily.    Dispense:  16 g    Refill:   2     There is no immunization history on file for this patient.  Family History  Problem Relation Age of Onset  . Diabetes Maternal Grandmother   . Hypertension Maternal Grandmother   . Diabetes Maternal Grandfather   . Diabetes Paternal Grandmother   . Heart disease Paternal Grandmother   . Diabetes Paternal Grandfather     History  Substance Use Topics  . Smoking status: Former Games developermoker  . Smokeless tobacco: Not on file  . Alcohol Use: No    Review of Systems   As noted in HPI  Filed Vitals:   07/19/14 1230  BP: 138/84  Pulse: 78  Temp: 98 F (36.7 C)  Resp: 16    Physical Exam  Physical Exam  HENT:  Mouth/Throat: No oropharyngeal exudate.  Nasal congestion no sinus tenderness  Eyes: EOM are normal. Pupils are equal, round, and reactive to light.  Cardiovascular: Normal rate and regular rhythm.   Pulmonary/Chest: Breath sounds normal. No respiratory distress. He has no wheezes. He has no rales.    CBC    Component Value Date/Time   WBC 4.9 04/28/2014 2107  RBC 4.93 04/28/2014 2107   HGB 15.2 04/28/2014 2107   HCT 45.0 04/28/2014 2107   PLT 199 04/28/2014 2107   MCV 91.3 04/28/2014 2107   LYMPHSABS 1.7 12/17/2013 1632   MONOABS 0.2 12/17/2013 1632   EOSABS 0.1 12/17/2013 1632   BASOSABS 0.0 12/17/2013 1632    CMP     Component Value Date/Time   NA 140 04/28/2014 2107   K 3.4* 04/28/2014 2107   CL 107 04/28/2014 2107   CO2 25 04/28/2014 2107   GLUCOSE 132* 04/28/2014 2107   BUN 14 04/28/2014 2107   CREATININE 0.88 04/28/2014 2107   CREATININE 0.88 04/14/2014 1511   CALCIUM 8.9 04/28/2014 2107   PROT 7.3 04/14/2014 1511   ALBUMIN 4.4 04/14/2014 1511   AST 30 04/14/2014 1511   ALT 58* 04/14/2014 1511   ALKPHOS 43 04/14/2014 1511   BILITOT 0.8 04/14/2014 1511   GFRNONAA >90 04/28/2014 2107   GFRNONAA >89 04/14/2014 1511   GFRAA >90 04/28/2014 2107   GFRAA >89 04/14/2014 1511    No results found for: CHOL  Lab Results  Component  Value Date/Time   HGBA1C 5.6 03/24/2013 03:40 PM    Lab Results  Component Value Date/Time   AST 30 04/14/2014 03:11 PM    Assessment and Plan  Essential hypertension, benign - Plan:as per patient he has not taken his blood pressure medication today, continue with current   amLODipine (NORVASC) 5 MG tablet, advised patient for DASH diet COMPLETE METABOLIC PANEL WITH GFR  Nasal congestion - Plan: fluticasone (FLONASE) 50 MCG/ACT nasal spray  Screening - Plan: COMPLETE METABOLIC PANEL WITH GFR, Hemoglobin A1c    Return in about 3 months (around 10/19/2014), or if symptoms worsen or fail to improve, for hypertension.   This note has been created with Education officer, environmental. Any transcriptional errors are unintentional.    Doris Cheadle, MD

## 2014-07-19 NOTE — Progress Notes (Signed)
Patient here for follow up on his hypertension and refill On his amlodopine

## 2014-07-20 ENCOUNTER — Telehealth: Payer: Self-pay

## 2014-07-20 NOTE — Telephone Encounter (Signed)
-----   Message from Deepak Advani, MD sent at 07/20/2014  2:36 PM EDT ----- Call and let the Patient know that blood work is normal.  

## 2014-07-20 NOTE — Telephone Encounter (Signed)
Patient is aware of his lab results 

## 2014-09-06 NOTE — Telephone Encounter (Signed)
meds have been called into the pharamcy on 07/27/2014

## 2014-09-08 ENCOUNTER — Telehealth: Payer: Self-pay

## 2014-09-08 ENCOUNTER — Telehealth: Payer: Self-pay | Admitting: Internal Medicine

## 2014-09-08 NOTE — Telephone Encounter (Signed)
Patient called to request a med refill for his blood pressure medication, [facility]. Please f/u

## 2014-09-08 NOTE — Telephone Encounter (Signed)
Patient called requesting a refill on amlodipine Made patient aware that the prescription was filled back in may #90 with 3 refills And he needs to call the pharmacy and request a refill

## 2014-09-17 ENCOUNTER — Emergency Department (HOSPITAL_COMMUNITY)
Admission: EM | Admit: 2014-09-17 | Discharge: 2014-09-17 | Disposition: A | Discharge status: Home / Self Care | Payer: Self-pay | Attending: Emergency Medicine | Admitting: Emergency Medicine

## 2014-09-17 ENCOUNTER — Encounter (HOSPITAL_COMMUNITY): Payer: Self-pay | Admitting: Emergency Medicine

## 2014-09-17 ENCOUNTER — Emergency Department (HOSPITAL_COMMUNITY): Payer: Self-pay

## 2014-09-17 DIAGNOSIS — K219 Gastro-esophageal reflux disease without esophagitis: Secondary | ICD-10-CM | POA: Insufficient documentation

## 2014-09-17 DIAGNOSIS — I10 Essential (primary) hypertension: Secondary | ICD-10-CM | POA: Insufficient documentation

## 2014-09-17 DIAGNOSIS — R079 Chest pain, unspecified: Secondary | ICD-10-CM | POA: Insufficient documentation

## 2014-09-17 DIAGNOSIS — Z79899 Other long term (current) drug therapy: Secondary | ICD-10-CM | POA: Insufficient documentation

## 2014-09-17 DIAGNOSIS — F419 Anxiety disorder, unspecified: Secondary | ICD-10-CM | POA: Insufficient documentation

## 2014-09-17 DIAGNOSIS — J45909 Unspecified asthma, uncomplicated: Secondary | ICD-10-CM | POA: Insufficient documentation

## 2014-09-17 DIAGNOSIS — Z87891 Personal history of nicotine dependence: Secondary | ICD-10-CM | POA: Insufficient documentation

## 2014-09-17 LAB — CBC WITH DIFFERENTIAL/PLATELET
Basophils Absolute: 0 10*3/uL (ref 0.0–0.1)
Basophils Relative: 0 % (ref 0–1)
Eosinophils Absolute: 0.1 10*3/uL (ref 0.0–0.7)
Eosinophils Relative: 1 % (ref 0–5)
HEMATOCRIT: 47.4 % (ref 39.0–52.0)
Hemoglobin: 16.3 g/dL (ref 13.0–17.0)
LYMPHS PCT: 37 % (ref 12–46)
Lymphs Abs: 1.9 10*3/uL (ref 0.7–4.0)
MCH: 30.8 pg (ref 26.0–34.0)
MCHC: 34.4 g/dL (ref 30.0–36.0)
MCV: 89.4 fL (ref 78.0–100.0)
MONO ABS: 0.4 10*3/uL (ref 0.1–1.0)
MONOS PCT: 7 % (ref 3–12)
Neutro Abs: 2.7 10*3/uL (ref 1.7–7.7)
Neutrophils Relative %: 55 % (ref 43–77)
PLATELETS: 194 10*3/uL (ref 150–400)
RBC: 5.3 MIL/uL (ref 4.22–5.81)
RDW: 13.8 % (ref 11.5–15.5)
WBC: 5 10*3/uL (ref 4.0–10.5)

## 2014-09-17 LAB — BASIC METABOLIC PANEL
Anion gap: 8 (ref 5–15)
BUN: 14 mg/dL (ref 6–20)
CALCIUM: 9.5 mg/dL (ref 8.9–10.3)
CO2: 25 mmol/L (ref 22–32)
CREATININE: 0.86 mg/dL (ref 0.61–1.24)
Chloride: 106 mmol/L (ref 101–111)
GFR calc non Af Amer: >60 mL/min (ref >60)
Glucose, Bld: 94 mg/dL (ref 65–99)
POTASSIUM: 4.2 mmol/L (ref 3.5–5.1)
Sodium: 139 mmol/L (ref 135–145)

## 2014-09-17 LAB — I-STAT TROPONIN, ED
TROPONIN I, POC: 0 ng/mL (ref 0.00–0.08)
Troponin i, poc: 0 ng/mL (ref 0.00–0.08)

## 2014-09-17 LAB — APTT: aPTT: 35 seconds (ref 24–37)

## 2014-09-17 LAB — PROTIME-INR
INR: 0.95 (ref 0.00–1.49)
Prothrombin Time: 12.9 seconds (ref 11.6–15.2)

## 2014-09-17 MED ORDER — SODIUM CHLORIDE 0.9 % IV SOLN
INTRAVENOUS | Status: DC
  Administered 2014-09-17: 15:00:00 via INTRAVENOUS

## 2014-09-17 MED ORDER — ASPIRIN 81 MG PO CHEW
324.0000 mg | CHEWABLE_TABLET | Freq: Once | ORAL | Status: AC
  Administered 2014-09-17: 324 mg via ORAL
  Filled 2014-09-17: qty 4

## 2014-09-17 MED ORDER — HYDROXYZINE HCL 25 MG PO TABS
25.0000 mg | ORAL_TABLET | Freq: Once | ORAL | Status: AC
  Administered 2014-09-17: 25 mg via ORAL
  Filled 2014-09-17: qty 1

## 2014-09-17 MED ORDER — HYDROXYZINE HCL 25 MG PO TABS: 25.0000 mg | ORAL_TABLET | Freq: Four times a day (QID) | ORAL | Status: DC

## 2014-09-17 NOTE — ED Notes (Cosign Needed)
Pt with normal delta troponin.  Stable for discharge.   BP 158/92 mmHg  Pulse 61  Temp(Src) 98 F (36.7 C) (Oral)  Resp 18  Ht  (1.803 m)  Wt 264 lb (119.75 kg)  BMI 36.84 kg/m2  SpO2 98%  I have reviewed nursing notes and vital signs. I personally viewed the imaging tests through PACS system and agrees with radiologist's intepretation I reviewed available ER/hospitalization records through the EMR  Results for orders placed or performed during the hospital encounter of 09/17/14  CBC with Differential  Result Value Ref Range   WBC 5.0 4.0 - 10.5 K/uL   RBC 5.30 4.22 - 5.81 MIL/uL   Hemoglobin 16.3 13.0 - 17.0 g/dL   HCT 16.1 09.6 - 04.5 %   MCV 89.4 78.0 - 100.0 fL   MCH 30.8 26.0 - 34.0 pg   MCHC 34.4 30.0 - 36.0 g/dL   RDW 40.9 81.1 - 91.4 %   Platelets 194 150 - 400 K/uL   Neutrophils Relative % 55 43 - 77 %   Neutro Abs 2.7 1.7 - 7.7 K/uL   Lymphocytes Relative 37 12 - 46 %   Lymphs Abs 1.9 0.7 - 4.0 K/uL   Monocytes Relative 7 3 - 12 %   Monocytes Absolute 0.4 0.1 - 1.0 K/uL   Eosinophils Relative 1 0 - 5 %   Eosinophils Absolute 0.1 0.0 - 0.7 K/uL   Basophils Relative 0 0 - 1 %   Basophils Absolute 0.0 0.0 - 0.1 K/uL  Basic metabolic panel  Result Value Ref Range   Sodium 139 135 - 145 mmol/L   Potassium 4.2 3.5 - 5.1 mmol/L   Chloride 106 101 - 111 mmol/L   CO2 25 22 - 32 mmol/L   Glucose, Bld 94 65 - 99 mg/dL   BUN 14 6 - 20 mg/dL   Creatinine, Ser 7.82 0.61 - 1.24 mg/dL   Calcium 9.5 8.9 - 95.6 mg/dL   GFR calc non Af Amer >60 >60 mL/min   GFR calc Af Amer >60 >60 mL/min   Anion gap 8 5 - 15  APTT  Result Value Ref Range   aPTT 35 24 - 37 seconds  Protime-INR  Result Value Ref Range   Prothrombin Time 12.9 11.6 - 15.2 seconds   INR 0.95 0.00 - 1.49  I-Stat Troponin, ED (not at [facility])  Result Value Ref Range   Troponin i, poc 0.00 0.00 - 0.08 ng/mL   Comment 3          I-Stat Troponin, ED (not at [facility])  Result Value Ref Range   Troponin i,  poc 0.00 0.00 - 0.08 ng/mL   Comment 3           Dg Chest 2 View  09/17/2014   CLINICAL DATA:  Shortness of breath for 3 days, asthma  EXAM: CHEST  2 VIEW  COMPARISON:  02/10/2014  FINDINGS: Cardiomediastinal silhouette is stable. No acute infiltrate or pleural effusion. No pulmonary edema. Bony thorax is unremarkable.  IMPRESSION: No active cardiopulmonary disease.   Electronically Signed   By: Natasha Mead M.D.   On: 09/17/2014 13:56      Fayrene Helper, PA-C 09/17/14 1647

## 2014-09-17 NOTE — Discharge Instructions (Signed)
Chest Pain (Nonspecific) Return for any chest pain or shortness of breath. Follow up with your primary care provider.  It is often hard to give a specific diagnosis for the cause of chest pain. There is always a chance that your pain could be related to something serious, such as a heart attack or a blood clot in the lungs. You need to follow up with your health care provider for further evaluation. CAUSES   Heartburn.  Pneumonia or bronchitis.  Anxiety or stress.  Inflammation around your heart (pericarditis) or lung (pleuritis or pleurisy).  A blood clot in the lung.  A collapsed lung (pneumothorax). It can develop suddenly on its own (spontaneous pneumothorax) or from trauma to the chest.  Shingles infection (herpes zoster virus). The chest wall is composed of bones, muscles, and cartilage. Any of these can be the source of the pain.  The bones can be bruised by injury.  The muscles or cartilage can be strained by coughing or overwork.  The cartilage can be affected by inflammation and become sore (costochondritis). DIAGNOSIS  Lab tests or other studies may be needed to find the cause of your pain. Your health care provider may have you take a test called an ambulatory electrocardiogram (ECG). An ECG records your heartbeat patterns over a 24-hour period. You may also have other tests, such as:  Transthoracic echocardiogram (TTE). During echocardiography, sound waves are used to evaluate how blood flows through your heart.  Transesophageal echocardiogram (TEE).  Cardiac monitoring. This allows your health care provider to monitor your heart rate and rhythm in real time.  Holter monitor. This is a portable device that records your heartbeat and can help diagnose heart arrhythmias. It allows your health care provider to track your heart activity for several days, if needed.  Stress tests by exercise or by giving medicine that makes the heart beat faster. TREATMENT   Treatment  depends on what may be causing your chest pain. Treatment may include:  Acid blockers for heartburn.  Anti-inflammatory medicine.  Pain medicine for inflammatory conditions.  Antibiotics if an infection is present.  You may be advised to change lifestyle habits. This includes stopping smoking and avoiding alcohol, caffeine, and chocolate.  You may be advised to keep your head raised (elevated) when sleeping. This reduces the chance of acid going backward from your stomach into your esophagus. Most of the time, nonspecific chest pain will improve within 2-3 days with rest and mild pain medicine.  HOME CARE INSTRUCTIONS   If antibiotics were prescribed, take them as directed. Finish them even if you start to feel better.  For the next few days, avoid physical activities that bring on chest pain. Continue physical activities as directed.  Do not use any tobacco products, including cigarettes, chewing tobacco, or electronic cigarettes.  Avoid drinking alcohol.  Only take medicine as directed by your health care provider.  Follow your health care provider's suggestions for further testing if your chest pain does not go away.  Keep any follow-up appointments you made. If you do not go to an appointment, you could develop lasting (chronic) problems with pain. If there is any problem keeping an appointment, call to reschedule. SEEK MEDICAL CARE IF:   Your chest pain does not go away, even after treatment.  You have a rash with blisters on your chest.  You have a fever. SEEK IMMEDIATE MEDICAL CARE IF:   You have increased chest pain or pain that spreads to your arm, neck, jaw, back,  or abdomen.  You have shortness of breath.  You have an increasing cough, or you cough up blood.  You have severe back or abdominal pain.  You feel nauseous or vomit.  You have severe weakness.  You faint.  You have chills. This is an emergency. Do not wait to see if the pain will go away. Get  medical help at once. Call your local emergency services (911 in [place] Do not drive yourself to the hospital. MAKE SURE YOU:   Understand these instructions.  Will watch your condition.  Will get help right away if you are not doing well or get worse. Document Released: 11/22/2004 Document Revised: 02/17/2013 Document Reviewed: 09/18/2007 [facility] Patient Information 2015 [place], [place]. This information is not intended to replace advice given to you by your health care provider. Make sure you discuss any questions you have with your health care provider.

## 2014-09-17 NOTE — ED Provider Notes (Signed)
CSN: [id number]     Arrival date & time 09/17/14  1246 History   First MD Initiated Contact with Patient 09/17/14 1305     Chief Complaint  Patient presents with  . Multiple Complaints   . Anxiety     (Consider location/radiation/quality/duration/timing/severity/associated sxs/prior Treatment) Patient is a 29 y.o. male presenting with anxiety. The history is provided by the patient. No language interpreter was used.  Anxiety Associated symptoms include chest pain. Pertinent negatives include no abdominal pain, fever, nausea or vomiting.  Walter Howell is a 29 y.o with a history of asthma, hypertension, anxiety, and GERD who presents for chest fluttering and bilateral arm numbness and tingling for the past 2 days.  He has no radiation of pain. He states he feels like it is anxiety related since he is stressed about bills and life in general. He denies taking anything for his symptoms.  Nothing makes his symptoms better or worse.  He is not a smoker. No family history.   Past Medical History  Diagnosis Date  . Asthma   . Hypertension   . Anxiety   . GERD (gastroesophageal reflux disease)    History reviewed. No pertinent past surgical history. Family History  Problem Relation Age of Onset  . Diabetes Maternal Grandmother   . Hypertension Maternal Grandmother   . Diabetes Maternal Grandfather   . Diabetes Paternal Grandmother   . Heart disease Paternal Grandmother   . Diabetes Paternal Grandfather    History  Substance Use Topics  . Smoking status: Former Games developer  . Smokeless tobacco: Not on file  . Alcohol Use: No    Review of Systems  Constitutional: Negative for fever.  Respiratory: Negative for chest tightness and shortness of breath.   Cardiovascular: Positive for chest pain.  Gastrointestinal: Negative for nausea, vomiting and abdominal pain.  All other systems reviewed and are negative.     Allergies  Review of patient's allergies indicates no known  allergies.  Home Medications   Prior to Admission medications   Medication Sig Start Date End Date Taking? Authorizing Provider  acetaminophen (TYLENOL) 500 MG tablet Take 1,000 mg by mouth every 6 (six) hours as needed for moderate pain or headache.   Yes Historical Provider, MD  albuterol (PROVENTIL HFA;VENTOLIN HFA) 108 (90 BASE) MCG/ACT inhaler Inhale 2 puffs into the lungs every 6 (six) hours as needed for wheezing or shortness of breath.    Yes Historical Provider, MD  amLODipine (NORVASC) 5 MG tablet Take 1 tablet (5 mg total) by mouth daily. 07/19/14  Yes Doris Cheadle, MD  Multiple Vitamin (MULTIVITAMIN WITH MINERALS) TABS tablet Take 1 tablet by mouth daily.   Yes Historical Provider, MD  ranitidine (ZANTAC) 150 MG tablet Take 150 mg by mouth 2 (two) times daily as needed for heartburn.    Yes Historical Provider, MD  fluticasone (FLONASE) 50 MCG/ACT nasal spray Place 2 sprays into both nostrils daily. Patient not taking: Reported on 09/17/2014 07/19/14   Doris Cheadle, MD  hydrOXYzine (ATARAX/VISTARIL) 25 MG tablet Take 1 tablet (25 mg total) by mouth every 6 (six) hours. 09/17/14   Walter Risby Patel-Mills, Walter Howell   BP 149/79 mmHg  Pulse 71  Temp(Src) 98 F (36.7 C) (Oral)  Resp 16  Ht  (1.803 m)  Wt 264 lb (119.75 kg)  BMI 36.84 kg/m2  SpO2 97% Physical Exam  Constitutional: He is oriented to person, place, and time. He appears well-developed and well-nourished.  HENT:  Head: Normocephalic and atraumatic.  Eyes: Conjunctivae  are normal.  Neck: Normal range of motion. Neck supple.  Cardiovascular: Normal rate, regular rhythm and normal heart sounds.   Pulmonary/Chest: Effort normal and breath sounds normal. No accessory muscle usage. No respiratory distress. He has no decreased breath sounds. He has no wheezes. He has no rales. He exhibits no tenderness.   No chest wall tenderness or reproducible pain.  Abdominal: Soft. There is no tenderness.  Musculoskeletal: Normal range of  motion.   5 out of 5 strength in upper bilateral extremities. 2+ radial pulses bilaterally. Able to abduct and adduct upper extremities without difficulty.    Neurological: He is alert and oriented to person, place, and time.  Skin: Skin is warm and dry.  Psychiatric: He has a normal mood and affect. His behavior is normal.  Nursing note and vitals reviewed.   Walter Howell Course  Procedures (including critical care time) Labs Review Labs Reviewed  CBC WITH DIFFERENTIAL/PLATELET  BASIC METABOLIC PANEL  APTT  PROTIME-INR  I-STAT TROPOININ, Walter Howell  Walter Howell, Walter Howell    Imaging Review Dg Chest 2 View  09/17/2014   CLINICAL DATA:  Shortness of breath for 3 days, asthma  EXAM: CHEST  2 VIEW  COMPARISON:  02/10/2014  FINDINGS: Cardiomediastinal silhouette is stable. No acute infiltrate or pleural effusion. No pulmonary edema. Bony thorax is unremarkable.  IMPRESSION: No active cardiopulmonary disease.   Electronically Signed   By: Natasha Mead M.D.   On: 09/17/2014 13:56     EKG Interpretation   Date/Time:  Friday September 17 2014 15:06:20 EDT Ventricular Rate:  56 PR Interval:  191 QRS Duration: 67 QT Interval:  359 QTC Calculation: 346 R Axis:   43 Text Interpretation:  Sinus rhythm early repolization Confirmed by ZAMMIT   MD, JOSEPH [phone]) on 09/17/2014 3:53:14 PM     MDM   Final diagnoses:  Chest pain, unspecified chest pain type  Anxiety  Patient presented for bilateral arm numbness and tingling, feeling jittery and felt as though his heart was fluttering. He was well appearing and in no acute distress. He was on his phone throughout the exam and during recheck. His labs were unremarkable. Chest xray is negative for pneumothorax, edema, or infiltrate. His troponin is negative but he did have some ST elevation and changes in his EKG from his prior visit in May 2016. STEMI orders were place.  Dr. Estell Harpin spoke to Dr. Nechama Guard, STEMI physician, regarding the patients EKG who stated that we  could cancel the code.  Medications  0.9 %  sodium chloride infusion ( Intravenous Stopped 09/17/14 1721)  hydrOXYzine (ATARAX/VISTARIL) tablet 25 mg (25 mg Oral Given 09/17/14 1504)  aspirin chewable tablet 324 mg (324 mg Oral Given 09/17/14 1519)  A repeat troponin was negative.  Patient is to follow up with his pcp and return for any worsening symptoms which I discussed with him.  He has no concerns or unanswered questions and is agreeable with the plan.     Walter Gosselin, Walter Howell 09/17/14 1746  Bethann Berkshire, MD 09/18/14 [phone]

## 2014-09-17 NOTE — ED Notes (Signed)
Pt states over the last few days he's felt increasingly anxious. States he's having occasional tingling in his arms, shortness of breath that comes and goes, that he woke up this morning and his heart was racing so he had to go lay back down, and that he's been having occasional diarrhea/nausea recently. States he feels as though he just can't calm down.

## 2014-09-20 ENCOUNTER — Ambulatory Visit: Payer: Self-pay | Admitting: Internal Medicine

## 2014-09-20 ENCOUNTER — Telehealth: Payer: Self-pay

## 2014-09-20 NOTE — Telephone Encounter (Signed)
Returned patient  Phone call Patient wanted to reschedule appt. But has had several know shows Explained the walk in policy  And he could be seen since he had a recent visit to the ED for Chest pains

## 2014-11-25 ENCOUNTER — Encounter (HOSPITAL_COMMUNITY): Payer: Self-pay | Admitting: Emergency Medicine

## 2014-11-25 ENCOUNTER — Emergency Department (HOSPITAL_COMMUNITY)
Admission: EM | Admit: 2014-11-25 | Discharge: 2014-11-25 | Disposition: A | Discharge status: Home / Self Care | Payer: Self-pay | Attending: Emergency Medicine | Admitting: Emergency Medicine

## 2014-11-25 ENCOUNTER — Emergency Department (HOSPITAL_COMMUNITY): Payer: Self-pay

## 2014-11-25 DIAGNOSIS — Z87891 Personal history of nicotine dependence: Secondary | ICD-10-CM | POA: Insufficient documentation

## 2014-11-25 DIAGNOSIS — Z79899 Other long term (current) drug therapy: Secondary | ICD-10-CM | POA: Insufficient documentation

## 2014-11-25 DIAGNOSIS — I10 Essential (primary) hypertension: Secondary | ICD-10-CM | POA: Insufficient documentation

## 2014-11-25 DIAGNOSIS — F419 Anxiety disorder, unspecified: Secondary | ICD-10-CM | POA: Insufficient documentation

## 2014-11-25 DIAGNOSIS — J069 Acute upper respiratory infection, unspecified: Secondary | ICD-10-CM | POA: Insufficient documentation

## 2014-11-25 DIAGNOSIS — J45909 Unspecified asthma, uncomplicated: Secondary | ICD-10-CM | POA: Insufficient documentation

## 2014-11-25 DIAGNOSIS — K219 Gastro-esophageal reflux disease without esophagitis: Secondary | ICD-10-CM | POA: Insufficient documentation

## 2014-11-25 NOTE — ED Provider Notes (Signed)
CSN: [id number]     Arrival date & time 11/25/14  1519 History  By signing my name below, I, Walter Howell, attest that this documentation has been prepared under the direction and in the presence of Wynetta Emery, PA-C Electronically Signed: Soijett Howell, ED Scribe. 11/25/2014. 4:16 PM.    Chief Complaint  Patient presents with  . Cough    The patient said he has has a "chest cold" for four or five days.  He denies fever or body aches but says he does have a productive cough.        The history is provided by the patient. No language interpreter was used.    Walter Howell is a 29 y.o. male who presents to the Emergency Department complaining of productive cough onset 4-5 days. He states that he is having associated symptoms of frontal HA, CP due to cough, and sore throat. He states that he has tried OTC medications with no relief for his symptoms. He denies fever, chills, color change, rash, wound, myalgia, vomiting, SOB, ear pain, neck pain, difficulty urinating, constipation, diarrhea, and any other symptoms. Denies family hx of early cardiac death. Denies DM but he has HTN that he takes medications for. [facility] He notes that he has sick contacts at work.   Past Medical History  Diagnosis Date  . Asthma   . Hypertension   . Anxiety   . GERD (gastroesophageal reflux disease)    History reviewed. No pertinent past surgical history. Family History  Problem Relation Age of Onset  . Diabetes Maternal Grandmother   . Hypertension Maternal Grandmother   . Diabetes Maternal Grandfather   . Diabetes Paternal Grandmother   . Heart disease Paternal Grandmother   . Diabetes Paternal Grandfather    Social History  Substance Use Topics  . Smoking status: Former Games developer  . Smokeless tobacco: None  . Alcohol Use: No    Review of Systems  A complete 10 system review of systems was obtained and all systems are negative except  as noted in the HPI and PMH.    Allergies  Review of patient's allergies indicates no known allergies.  Home Medications   Prior to Admission medications   Medication Sig Start Date End Date Taking? Authorizing Provider  acetaminophen (TYLENOL) 500 MG tablet Take 1,000 mg by mouth every 6 (six) hours as needed for moderate pain or headache.    Historical Provider, MD  albuterol (PROVENTIL HFA;VENTOLIN HFA) 108 (90 BASE) MCG/ACT inhaler Inhale 2 puffs into the lungs every 6 (six) hours as needed for wheezing or shortness of breath.     Historical Provider, MD  amLODipine (NORVASC) 5 MG tablet Take 1 tablet (5 mg total) by mouth daily. 07/19/14   Doris Cheadle, MD  fluticasone (FLONASE) 50 MCG/ACT nasal spray Place 2 sprays into both nostrils daily. Patient not taking: Reported on 09/17/2014 07/19/14   Doris Cheadle, MD  hydrOXYzine (ATARAX/VISTARIL) 25 MG tablet Take 1 tablet (25 mg total) by mouth every 6 (six) hours. 09/17/14   Hanna Patel-Mills, PA-C  Multiple Vitamin (MULTIVITAMIN WITH MINERALS) TABS tablet Take 1 tablet by mouth daily.    Historical Provider, MD  ranitidine (ZANTAC) 150 MG tablet Take 150 mg by mouth 2 (two) times daily as needed for heartburn.     Historical Provider, MD   BP 146/87 mmHg  Pulse 78  Temp(Src) 98.4 F (36.9 C) (Oral)  Resp 22  SpO2 99%  Physical Exam  Constitutional: He is oriented to person, place, and time. He appears well-developed and well-nourished. No distress.  HENT:  Head: Normocephalic and atraumatic.  Right Ear: Tympanic membrane and ear canal normal.  Left Ear: Tympanic membrane and ear canal normal.  Mouth/Throat: Uvula is midline, oropharynx is clear and moist and mucous membranes are normal.  No drooling or stridor. Posterior pharynx mildly injected no significant tonsillar hypertrophy. No exudate. Soft palate rises symmetrically. No TTP or induration under tongue.   No tenderness to palpation of frontal or bilateral maxillary  sinuses.  No mucosal edema in the nares.  Bilateral tympanic membranes with normal architecture and good light reflex.    Eyes: EOM are normal.  Neck: Neck supple.  Cardiovascular: Normal rate, regular rhythm and normal heart sounds.  Exam reveals no gallop and no friction rub.   No murmur heard. Pulmonary/Chest: Effort normal and breath sounds normal. No respiratory distress. He has no wheezes. He has no rales.  Musculoskeletal: Normal range of motion.  Neurological: He is alert and oriented to person, place, and time.  Skin: Skin is warm and dry.  Psychiatric: He has a normal mood and affect. His behavior is normal.  Nursing note and vitals reviewed.   ED Course  Procedures (including critical care time) DIAGNOSTIC STUDIES: Oxygen Saturation is 99% on RA, nl by my interpretation.    COORDINATION OF CARE: 4:14 PM Discussed treatment plan with pt at bedside which includes claritin-D and pt agreed to plan.    Labs Review Labs Reviewed - No data to display  Imaging Review Dg Chest 2 View  11/25/2014   CLINICAL DATA:  Cough and congestion for 1 week  EXAM: CHEST - 2 VIEW  COMPARISON:  09/17/2014  FINDINGS: The heart size and mediastinal contours are within normal limits. Both lungs are clear. The visualized skeletal structures are unremarkable.  IMPRESSION: No active disease.   Electronically Signed   By: Alcide Clever M.D.   On: 11/25/2014 16:03   I have personally reviewed and evaluated these images as part of my medical decision-making.   EKG Interpretation None      MDM   Final diagnoses:  URI, acute    Filed Vitals:   11/25/14 1527  BP: 146/87  Pulse: 78  Temp: 98.4 F (36.9 C)  TempSrc: Oral  Resp: 22  SpO2: 99%   Walter Howell is a pleasant 29 y.o. male presenting with rhinorrhea and cough. Lung sounds are clear to auscultation, patient saturating well on room air. Chest x-ray without infiltrate. Likely URI. Encouraged decongestion and will write  patient a work note. Advise close follow-up with primary care and return to ED for worsening symptoms.  Evaluation does not show pathology that would require ongoing emergent intervention or inpatient treatment. Pt is hemodynamically stable and mentating appropriately. Discussed findings and plan with patient/guardian, who agrees with care plan. All questions answered. Return precautions discussed and outpatient follow up given.   I personally performed the services described in this documentation, which was scribed in my presence. The recorded information has been reviewed and is accurate.    Wynetta Emery, PA-C 11/25/14 1934  Tilden Fossa, MD 11/27/14 [phone]

## 2014-11-25 NOTE — Discharge Instructions (Signed)
Please follow with your primary care doctor in the next 2 days for a check-up. They must obtain records for further management.   Do not hesitate to return to the Emergency Department for any new, worsening or concerning symptoms.   Use nasal saline (you can try [employer] Simply Saline) at least 4 times a day, use saline 5-10 minutes before using the fluticasone (flonase) nasal spray  Do not use Afrin (Oxymetazoline)  Rest, wash hands frequently  and drink plenty of water.  You may try counter medication such as Mucinex or claritin decongestant.

## 2014-11-25 NOTE — ED Notes (Signed)
The patient said he has has a "chest cold" for four or five days.  He denies fever or body aches but says he does have a productive cough.  He has taken OTC medications but they have not worked.  He is here to be evaluated.

## 2014-11-25 NOTE — ED Notes (Signed)
Pt returned from X-ray.  

## 2014-11-25 NOTE — ED Notes (Signed)
Pt acknowledges taking all belongings home. 

## 2015-01-19 ENCOUNTER — Emergency Department (HOSPITAL_COMMUNITY)
Admission: EM | Admit: 2015-01-19 | Discharge: 2015-01-19 | Disposition: A | Discharge status: Home / Self Care | Payer: Self-pay | Attending: Emergency Medicine | Admitting: Emergency Medicine

## 2015-01-19 ENCOUNTER — Encounter (HOSPITAL_COMMUNITY): Payer: Self-pay | Admitting: Emergency Medicine

## 2015-01-19 DIAGNOSIS — K219 Gastro-esophageal reflux disease without esophagitis: Secondary | ICD-10-CM | POA: Insufficient documentation

## 2015-01-19 DIAGNOSIS — R05 Cough: Secondary | ICD-10-CM | POA: Insufficient documentation

## 2015-01-19 DIAGNOSIS — Z79899 Other long term (current) drug therapy: Secondary | ICD-10-CM | POA: Insufficient documentation

## 2015-01-19 DIAGNOSIS — R110 Nausea: Secondary | ICD-10-CM | POA: Insufficient documentation

## 2015-01-19 DIAGNOSIS — Z87891 Personal history of nicotine dependence: Secondary | ICD-10-CM | POA: Insufficient documentation

## 2015-01-19 DIAGNOSIS — F419 Anxiety disorder, unspecified: Secondary | ICD-10-CM | POA: Insufficient documentation

## 2015-01-19 DIAGNOSIS — J45909 Unspecified asthma, uncomplicated: Secondary | ICD-10-CM | POA: Insufficient documentation

## 2015-01-19 DIAGNOSIS — I10 Essential (primary) hypertension: Secondary | ICD-10-CM | POA: Insufficient documentation

## 2015-01-19 DIAGNOSIS — R0981 Nasal congestion: Secondary | ICD-10-CM | POA: Insufficient documentation

## 2015-01-19 MED ORDER — ONDANSETRON 4 MG PO TBDP
4.0000 mg | ORAL_TABLET | Freq: Once | ORAL | Status: AC
  Administered 2015-01-19: 4 mg via ORAL
  Filled 2015-01-19: qty 1

## 2015-01-19 MED ORDER — ONDANSETRON 4 MG PO TBDP: 4.0000 mg | ORAL_TABLET | Freq: Three times a day (TID) | ORAL | Status: DC | PRN

## 2015-01-19 NOTE — Discharge Instructions (Signed)
1. Medications: zofran, OTC cough and cold medicine, usual home medications 2. Treatment: rest, drink plenty of fluids 3. Follow Up: please followup with your primary doctor for discussion of your diagnoses and further evaluation after today's visit; if you do not have a primary care doctor use the resource guide provided to find one; please return to the ER for high fever, chest pain, shortness of breath, abdominal pain, vomiting, new or worsening symptoms   Nausea, Adult Nausea is the feeling that you have an upset stomach or have to vomit. Nausea by itself is not likely a serious concern, but it may be an early sign of more serious medical problems. As nausea gets worse, it can lead to vomiting. If vomiting develops, there is the risk of dehydration.  CAUSES   Viral infections.  Food poisoning.  Medicines.  Pregnancy.  Motion sickness.  Migraine headaches.  Emotional distress.  Severe pain from any source.  Alcohol intoxication. HOME CARE INSTRUCTIONS  Get plenty of rest.  Ask your caregiver about specific rehydration instructions.  Eat small amounts of food and sip liquids more often.  Take all medicines as told by your caregiver. SEEK MEDICAL CARE IF:  You have not improved after 2 days, or you get worse.  You have a headache. SEEK IMMEDIATE MEDICAL CARE IF:   You have a fever.  You faint.  You keep vomiting or have blood in your vomit.  You are extremely weak or dehydrated.  You have dark or bloody stools.  You have severe chest or abdominal pain. MAKE SURE YOU:  Understand these instructions.  Will watch your condition.  Will get help right away if you are not doing well or get worse.   This information is not intended to replace advice given to you by your health care provider. Make sure you discuss any questions you have with your health care provider.   Document Released: 03/22/2004 Document Revised: 03/05/2014 Document Reviewed:  10/25/2010 Elsevier Interactive Patient Education 2016 [employer]   Emergency Department Resource Guide 1) Find a Doctor and Pay Out of Pocket Although you won't have to find out who is covered by your insurance plan, it is a good idea to ask around and get recommendations. You will then need to call the office and see if the doctor you have chosen will accept you as a new patient and what types of options they offer for patients who are self-pay. Some doctors offer discounts or will set up payment plans for their patients who do not have insurance, but you will need to ask so you aren't surprised when you get to your appointment.  2) Contact Your Local Health Department Not all health departments have doctors that can see patients for sick visits, but many do, so it is worth a call to see if yours does. If you don't know where your local health department is, you can check in your phone book. The CDC also has a tool to help you locate your state's health department, and many state websites also have listings of all of their local health departments.  3) Find a Walk-in Clinic If your illness is not likely to be very severe or complicated, you may want to try a walk in clinic. These are popping up all over the country in pharmacies, drugstores, and shopping centers. They're usually staffed by nurse practitioners or physician assistants that have been trained to treat common illnesses and complaints. They're usually fairly quick and inexpensive. However, if you  have serious medical issues or chronic medical problems, these are probably not your best option.  No Primary Care Doctor: - Call Health Connect at  [phone] - they can help you locate a primary care doctor that  accepts your insurance, provides certain services, etc. - Physician Referral Service- [phone]  Chronic Pain Problems: Organization         Address  Phone   Notes  [facility]  [phone] Patients  need to be referred by their primary care doctor.   Medication Assistance: Organization         Address  Phone   Notes  [facility] [address] [place] [place], [place] [address] [phone] --Must be a resident of [facility] -- Must have NO insurance coverage whatsoever (no Medicaid/ Medicare, etc.) -- The pt. MUST have a primary care doctor that directs their care regularly and follows them in the community   [facility]  [phone]   [employer]  [phone]    Agencies that provide inexpensive medical care: Organization         Address  Phone   Notes  [facility]  [phone]   [facility]    [phone]   [facility] [address] [place], [place] [address] [phone]   [facility] [address] [place] [place] [phone]   Planned Parenthood    [phone]   [facility]    [phone]   [facility]  [address], [place] Phone:  [phone], Fax:  [phone] Hours of Operation:  9 am - 6 pm, M-F.  Also accepts Medicaid/Medicare and self-pay.  [facility]  [address], [place] Phone: [phone], Fax: [phone]. Hours of Operation:  8:30 am - 5:30 pm, M-F.  Also accepts Medicaid and self-pay.  [facility] [place] [address], [place] Phone: [phone]   [facility] [address] Natasha Bence [place], [place] [phone], Ext. [phone] Mondays & Thursdays: 7-9 AM.  First 15 patients are seen on a first come, first serve basis.    Medicaid-accepting [facility] Providers:  Organization         Address  Phone   Notes  [facility] [address], [place] [phone] Also accepts self-pay patients.  [facility] [address] Laure[place] Josephs [place], [place]  [phone]    [facility] [address], [place] [phone]   [facility] [address] [place] [phone]   Renaye Rakers [address], [place]   [phone] Only accepts [place] Access [place] patients after they have their name applied to their card.   Self-Pay (no insurance) in [facility]:  Organization         Address  Phone   Notes  Sickle Cell Patients, [facility] [address] [place], [place] [phone]   [facility] [address] [employer] [employer], [place] [phone]   [facility] [place]  [address], [place] [phone]   [facility]/Dr. Osei-Bonsu  [address] [place] or [address], [place] [phone] Phone number for both [employer] and [place]  locations is the same.  [facility]gent [facility] and [facility] [address], [place] [phone]) [phone]   [facility] [place] [address] [place] [address], [place] or [address] [phone]   [facility] [address], [place] [phone](336) [phone], phone; [phone], fax Sees patients 1st and 3rd Saturday of every month.  Must not qualify for public or private insurance (i.e. Medicaid, Medicare, [place] Health Choice, Veterans' Benefits)  Household income should be no more than 200% of the poverty level The clinic cannot treat you if you are pregnant or think you are pregnant  Sexually transmitted diseases are not treated at the clinic.    Dental Care: Organization         Address  Phone  Notes  [facility] [address], [place] [phone] Accepts children up to age 28 who are enrolled in [place] or [place] Health Choice; pregnant women with a Medicaid card; and children who have applied for Medicaid or [place] Health Choice, but were declined,  whose parents can pay a reduced fee at time of service.  [facility] [place]  [address], [place] [phone]) [phone] Accepts children up to age 28 who are enrolled in [place] or [place] Health Choice; pregnant women with a Medicaid card; and children who have applied for Medicaid or [place] Health Choice, but were declined, whose parents can pay a reduced fee at time of service.  [facility]  [address], [place] [phone]) [phone] Patients are seen by appointment only. Walk-ins are not accepted. [facility] will see patients 28 years of age and older. Monday - Tuesday (8am-5pm) Most Wednesdays (8:30-5pm) $30 per visit, cash only  [facility]  [address], [facility] [phone]) [phone] Patients are seen by appointment only. Walk-ins are not accepted. [facility] will see patients 618 years of age and older. One Wednesday Evening (Monthly: Volunteer Based).  $30 per visit, cash only  [employer] [facility] [facility]  [phone] for adults; Children under age 654, call Graduate Pediatric Dentistry at [phone]) [phone]. Children aged 64-14, please call [phone] to request a pediatric application.  Dental services are provided in all areas of dental care including fillings, crowns and bridges, complete and partial dentures, implants, gum treatment, root canals, and extractions. Preventive care is also provided. Treatment is provided to both adults and children. Patients are selected via a lottery and there is often a waiting list.   [facility] [address], [place]  [phone] [email or web address]   [facility] [address], [place], [place] [phone], Ext. [phone] Second and Fourth Thursday of each month, opens at 6:30 AM; Clinic ends at 9 AM.  Patients are seen on a first-come first-served basis, and a limited number are seen during each clinic.   [facility]  [address], [place], [place] [phone]) [phone]   Eligibility Requirements You must have lived in [place], [place], or [place] counties for at least the last three months.   You cannot be eligible for state or federal sponsored National Cityhealthcare insurance, including CIGNAVeterans Administration, [place]icaid, or [employer]icare.   You generally cannot be eligible for healthcare insurance through your employer.    How to apply: Eligibility screenings are held every Tuesday and Wednesday afternoon from 1:00 pm until 4:00 pm. You do not need  an appointment for the interview!  [facility] [address] [place], [place] [phone]   [facility] Health [facility]  [phone]   [facility] Department  [phone]   [facility] Health Department  [phone]    Behavioral Health Resources in the Community: Intensive Outpatient Programs Organization         Address  Phone  Notes  [facility] [address], [place], [place] [phone]   [facility]patient [address] [place], [place] [phone]   [facility] [address], [place], [place]  [phone]   [facility] [address]  [place], [place] [phone] or [phone]   Substance Abuse Resources Organization         Address  Phone  Notes  Alcohol [facility] Drug Services  [phone]   [facility]  [phone]   [facility] [place]  [phone]   Floydene Flock  [phone]   Residential & Outpatient Substance Abuse Program  [phone]   Psychological Services Organization         Address  Phone  Notes  [facility]  [phone]   [facility]  [phone]   [facility] [address] [place] [phone] or [phone]    Mobile Crisis Teams Organization         Address  Phone  Notes  [facility], Mobile Crisis Care Unit   [phone]   [facility]  [address] [place], [place] [phone]   Doristine Locks [address] [place] [phone]    Self-Help/Support Groups Organization         Address  Phone             Notes  Mental Health Assoc. of  - variety of support groups  336- I7437963 Call for more information  Narcotics Anonymous (NA), [facility]aring [facility] [address], [employer] [place]  2 meetings at this location   Statistician         Address  Phone  Notes  [facility] [address],    [place]  [phone]   [facility]  [address], [place] [address], [place], [place] [phone]   [facility] [address] [place], [place] [phone] Admissions: 8am-3pm M-F  [facility] [address],    [place], [place] [phone]   [facility] [address] [place], [place], [place] [phone]   [facility] [address],  [place], [place] [phone]   [facility] - Intensive Outpatient [address] Laure[address] Josephs [address], [place], [place] [phone]   [facility] ([facility] [address]  [place], [place] [phone] or [phone]   [facility] ([facility]) [address] [place], [place] [phone] Accepts Medicaid  [facility] [address]  [place] [phone] Substance Abuse/Addiction Treatment   [facility] Organization         Address  Phone  Notes  [facility]  [phone]   Angie Fava, PhD [address] [place], [place]   [phone] or [phone]   [facility] Behavioral   [address] [place], [place] [phone]   [facility] [address] [place], [place] [phone] Insurance/Medicaid/sponsorship through [employer] and Families [address]  [place], [place] [phone] [place], [place] [phone]    Dr. Adele Schilder  [phone]   [facility] Dept. 1) [address] [place] 2) [place] 3)  [address], [place] [phone]  [phone]   [place] [phone] or [phone] (After Hours)

## 2015-01-19 NOTE — ED Notes (Signed)
Patient d/c'd selft care.  F/U and medications discussed.  Patient verbalized understanding.

## 2015-01-19 NOTE — ED Notes (Signed)
Per pt, states nasal congestion, post nasal drip causing GERD and nausea

## 2015-01-19 NOTE — ED Provider Notes (Signed)
CSN: [id number]     Arrival date & time 01/19/15  1027 History   First MD Initiated Contact with Patient 01/19/15 1035     Chief Complaint  Patient presents with  . nasal congestion/nausea     HPI   Walter Howell is a 29 y.o. male with a PMH of asthma, HTN, GERD, anxiety who presents to the ED with nasal congestion and nausea x 4-5 days. He reports his symptoms have been constant. He denies exacerbating factors, though thinks his nasal congestion and post-nasal drip may be contributing to his nausea. He states he has tried C[employer] manager[employer]a seltzer for his congestion and ginger ale for his nausea with some symptom relief. He reports associated mild intermittent cough productive of yellow sputum. He denies fever, chills, HA, lightheadedness, chest pain, shortness of breath, abdominal pain, vomiting, diarrhea, constipation, dysuria, urgency, frequency. He reports he has a history of GERD, however currently he denies reflux symptoms.   Past Medical History  Diagnosis Date  . Asthma   . Hypertension   . Anxiety   . GERD (gastroesophageal reflux disease)    History reviewed. No pertinent past surgical history. Family History  Problem Relation Age of Onset  . Diabetes Maternal Grandmother   . Hypertension Maternal Grandmother   . Diabetes Maternal Grandfather   . Diabetes Paternal Grandmother   . Heart disease Paternal Grandmother   . Diabetes Paternal Grandfather    Social History  Substance Use Topics  . Smoking status: Former Games developermoker  . Smokeless tobacco: None  . Alcohol Use: No      Review of Systems  Constitutional: Negative for fever and chills.  HENT: Positive for congestion. Negative for sore throat.   Respiratory: Positive for cough. Negative for shortness of breath.   Cardiovascular: Negative for chest pain.  Gastrointestinal: Positive for nausea. Negative for vomiting, abdominal pain, diarrhea and constipation.  Genitourinary: Negative for dysuria, urgency and frequency.   Neurological: Negative for light-headedness and headaches.  All other systems reviewed and are negative.     Allergies  Review of patient's allergies indicates no known allergies.  Home Medications   Prior to Admission medications   Medication Sig Start Date End Date Taking? Authorizing Provider  acetaminophen (TYLENOL) 500 MG tablet Take 1,000 mg by mouth every 6 (six) hours as needed for moderate pain or headache.    Historical Provider, MD  albuterol (PROVENTIL HFA;VENTOLIN HFA) 108 (90 BASE) MCG/ACT inhaler Inhale 2 puffs into the lungs every 6 (six) hours as needed for wheezing or shortness of breath.     Historical Provider, MD  amLODipine (NORVASC) 5 MG tablet Take 1 tablet (5 mg total) by mouth daily. 07/19/14   Doris Cheadleeepak Advani, MD  fluticasone (FLONASE) 50 MCG/ACT nasal spray Place 2 sprays into both nostrils daily. Patient not taking: Reported on 09/17/2014 07/19/14   Doris Cheadleeepak Advani, MD  hydrOXYzine (ATARAX/VISTARIL) 25 MG tablet Take 1 tablet (25 mg total) by mouth every 6 (six) hours. 09/17/14   Hanna Patel-Mills, PA-C  Multiple Vitamin (MULTIVITAMIN WITH MINERALS) TABS tablet Take 1 tablet by mouth daily.    Historical Provider, MD  ondansetron (ZOFRAN ODT) 4 MG disintegrating tablet Take 1 tablet (4 mg total) by mouth every 8 (eight) hours as needed for nausea. 01/19/15   Mady GemmaElizabeth C Landen Knoedler, PA-C  ranitidine (ZANTAC) 150 MG tablet Take 150 mg by mouth 2 (two) times daily as needed for heartburn.     Historical Provider, MD    BP 150/73 mmHg  Pulse 61  Temp(Src) 97.9 F (36.6 C) (Oral)  Resp 20  Ht  (1.803 m)  Wt 104.327 kg  BMI 32.09 kg/m2  SpO2 95% Physical Exam  Constitutional: He is oriented to person, place, and time. He appears well-developed and well-nourished. No distress.  HENT:  Head: Normocephalic and atraumatic.  Right Ear: External ear normal.  Left Ear: External ear normal.  Nose: Nose normal. No rhinorrhea.  Mouth/Throat: Uvula is midline,  oropharynx is clear and moist and mucous membranes are normal. No oropharyngeal exudate, posterior oropharyngeal edema, posterior oropharyngeal erythema or tonsillar abscesses.  Eyes: Conjunctivae, EOM and lids are normal. Pupils are equal, round, and reactive to light. Right eye exhibits no discharge. Left eye exhibits no discharge. No scleral icterus.  Neck: Normal range of motion. Neck supple.  Cardiovascular: Normal rate, regular rhythm, normal heart sounds, intact distal pulses and normal pulses.   Pulmonary/Chest: Effort normal and breath sounds normal. No respiratory distress. He has no wheezes. He has no rales.  Abdominal: Soft. Normal appearance and bowel sounds are normal. He exhibits no distension and no mass. There is no tenderness. There is no rigidity, no rebound and no guarding.  Musculoskeletal: Normal range of motion. He exhibits no edema or tenderness.  Neurological: He is alert and oriented to person, place, and time.  Skin: Skin is warm, dry and intact. No rash noted. He is not diaphoretic. No erythema. No pallor.  Psychiatric: He has a normal mood and affect. His speech is normal and behavior is normal.  Nursing note and vitals reviewed.   ED Course  Procedures (including critical care time)  Labs Review Labs Reviewed - No data to display  Imaging Review No results found.     EKG Interpretation None      MDM   Final diagnoses:  Nasal congestion  Nausea    29 year old male presents with nasal congestion and nausea x 4-5 days. Reports associated mild intermittent cough productive of yellow sputum. Denies fever, chills, HA, lightheadedness, chest pain, shortness of breath, abdominal pain, vomiting, diarrhea, constipation, dysuria, urgency, frequency.   Patient is afebrile. Vital signs stable. No nasal congestion. Posterior oropharynx without erythema, edema, or exudate. Heart RRR. Lungs clear to auscultation bilaterally. No wheezing. Abdomen soft, non-tender,  non-distended.  Will give zofran for nausea.  Patient reports significant symptom improvement. Patient is nontoxic and well-appearing. Feel he is stable for discharge at this time. Will send home with prescription for zofran for nausea. Advised to take over-the-counter cough and cold medication for additional symptom relief. Return precautions discussed. Patient to follow-up with PCP. Patient verbalizes his understanding and is in agreement with plan.  BP 150/73 mmHg  Pulse 61  Temp(Src) 97.9 F (36.6 C) (Oral)  Resp 20  Ht  (1.803 m)  Wt 104.327 kg  BMI 32.09 kg/m2  SpO2 95%     Mady Gemma, PA-C 01/19/15 1143  Lorre Nick, MD 01/20/15 [phone]

## 2015-02-22 NOTE — Telephone Encounter (Signed)
error 

## 2015-02-24 ENCOUNTER — Encounter: Payer: Self-pay | Admitting: Internal Medicine

## 2015-02-24 ENCOUNTER — Ambulatory Visit: Payer: Self-pay | Attending: Internal Medicine | Admitting: Internal Medicine

## 2015-02-24 VITALS — BP 162/85 | HR 77 | Temp 97.8°F | Resp 16 | Ht 71.0 in | Wt 268.4 lb

## 2015-02-24 DIAGNOSIS — R300 Dysuria: Secondary | ICD-10-CM | POA: Insufficient documentation

## 2015-02-24 DIAGNOSIS — I10 Essential (primary) hypertension: Secondary | ICD-10-CM | POA: Insufficient documentation

## 2015-02-24 DIAGNOSIS — R51 Headache: Secondary | ICD-10-CM | POA: Insufficient documentation

## 2015-02-24 DIAGNOSIS — Z79899 Other long term (current) drug therapy: Secondary | ICD-10-CM | POA: Insufficient documentation

## 2015-02-24 DIAGNOSIS — K219 Gastro-esophageal reflux disease without esophagitis: Secondary | ICD-10-CM | POA: Insufficient documentation

## 2015-02-24 DIAGNOSIS — R030 Elevated blood-pressure reading, without diagnosis of hypertension: Secondary | ICD-10-CM | POA: Insufficient documentation

## 2015-02-24 LAB — POCT URINALYSIS DIPSTICK
Bilirubin, UA: NEGATIVE
Glucose, UA: NEGATIVE
Ketones, UA: NEGATIVE
Leukocytes, UA: NEGATIVE
Nitrite, UA: NEGATIVE
Protein, UA: NEGATIVE
SPEC GRAV UA: 1.02
UROBILINOGEN UA: 0.2
pH, UA: 5.5

## 2015-02-24 MED ORDER — AMLODIPINE BESYLATE 10 MG PO TABS: 10.0000 mg | ORAL_TABLET | Freq: Every day | ORAL | Status: DC

## 2015-02-24 MED ORDER — OMEPRAZOLE 20 MG PO CPDR: 20.0000 mg | DELAYED_RELEASE_CAPSULE | Freq: Every day | ORAL | Status: DC

## 2015-02-24 NOTE — Progress Notes (Signed)
Patient here for follow up on his HTN Patient complains of having some epigastric pain on and off Currently taking zantac Patient also requesting to be tested for STD's

## 2015-02-24 NOTE — Progress Notes (Signed)
Patient ID: Ziyan Schoon, male   DOB: 1985-03-11, 29 y.o.   MRN: [id number] Subjective:  Dewaine Morocho is a 29 y.o. male with hypertension.  Patent reports that she has been taking his blood pressure medication daily without complications. He states that he checks his pressures at the local pharmacy and the readings are still elevated. He states that he often gets headaches in the back of his head and believes it is related to his pressure. He reports that he has heartburns everyday causing him to wake up in his sleep. He states that he does not eat after 8 pm and goes to bed around 12 pm. He admits to often eating acidic foods. He reports that he would like to be STD tested today. He reports that he has noticed some burning with urination but denies penile discharge. He believes that his 28 year male partner has been cheating on him.  Current Outpatient Prescriptions  Medication Sig Dispense Refill  . amLODipine (NORVASC) 5 MG tablet Take 1 tablet (5 mg total) by mouth daily. 90 tablet 3  . Multiple Vitamin (MULTIVITAMIN WITH MINERALS) TABS tablet Take 1 tablet by mouth daily.    . ranitidine (ZANTAC) 150 MG tablet Take 150 mg by mouth 2 (two) times daily as needed for heartburn.     Marland Kitchen acetaminophen (TYLENOL) 500 MG tablet Take 1,000 mg by mouth every 6 (six) hours as needed for moderate pain or headache.    . albuterol (PROVENTIL HFA;VENTOLIN HFA) 108 (90 BASE) MCG/ACT inhaler Inhale 2 puffs into the lungs every 6 (six) hours as needed for wheezing or shortness of breath.     . fluticasone (FLONASE) 50 MCG/ACT nasal spray Place 2 sprays into both nostrils daily. (Patient not taking: Reported on 09/17/2014) 16 g 2  . hydrOXYzine (ATARAX/VISTARIL) 25 MG tablet Take 1 tablet (25 mg total) by mouth every 6 (six) hours. 12 tablet 0  . ondansetron (ZOFRAN ODT) 4 MG disintegrating tablet Take 1 tablet (4 mg total) by mouth every 8 (eight) hours as needed for nausea. 10 tablet 0   No current  facility-administered medications for this visit.   ROS: Other than what is stated in HPI, all other systems are negative.    Objective:  BP 148/90 mmHg  Pulse 72  Temp(Src) 97.8 F (36.6 C)  Resp 16  Ht  (1.803 m)  Wt 268 lb 6.4 oz (121.745 kg)  BMI 37.45 kg/m2  SpO2 100%  Appearance alert, well appearing, and in no distress, oriented to person, place, and time and overweight. General exam BP noted to be mildly elevated today in office today and at prior visit, S1, S2 normal, no gallop, no murmur, chest clear, no JVD, no HSM, no edema.  Lab review: labs are reviewed, up to date and normal.   Assessment:   Kameren was seen today for hypertension.  Diagnoses and all orders for this visit:  Dysuria -     POCT urinalysis dipstick -     Urine cytology ancillary only -     HIV antibody (with reflex) -     RPR Will test and treat as needed. Condoms given in office. Educated on importance of safe sex  Essential hypertension, benign -     Increased amLODipine (NORVASC) 10 MG tablet; Take 1 tablet (10 mg total) by mouth daily. BP uncontrolled at several visits including today. I have increased his amlodipine today.  Gastroesophageal reflux disease, esophagitis presence not specified -  omeprazole (PRILOSEC) 20 MG capsule; Take 1 capsule (20 mg total) by mouth daily before breakfast. Discussed diet and weight with patient relating to acid reflux.  Went over things that may exacerbate acid reflux such as tomatoes, spicy foods, coffee, carbonated beverages, chocolates, etc.  Advised patient to avoid laying down at least two hours after meals and sleep with HOB elevated.    Plan:  Reviewed diet, exercise and weight control. Recommended sodium restriction. Copy of written low fat low cholesterol diet provided and reviewed. Cardiovascular risk and specific lipid/LDL goals reviewed.    Return in about 2 weeks (around 03/10/2015) for Nurse Visit-BP check and 3 mo PCP  .   Ambrose FinlandValerie A Keck, NP 02/24/2015 1:47 PM

## 2015-02-24 NOTE — Patient Instructions (Signed)
Food Choices for Gastroesophageal Reflux Disease, Adult When you have gastroesophageal reflux disease (GERD), the foods you eat and your eating habits are very important. Choosing the right foods can help ease the discomfort of GERD. WHAT GENERAL GUIDELINES DO I NEED TO FOLLOW?  Choose fruits, vegetables, whole grains, low-fat dairy products, and low-fat meat, fish, and poultry.  Limit fats such as oils, salad dressings, butter, nuts, and avocado.  Keep a food diary to identify foods that cause symptoms.  Avoid foods that cause reflux. These may be different for different people.  Eat frequent small meals instead of three large meals each day.  Eat your meals slowly, in a relaxed setting.  Limit fried foods.  Cook foods using methods other than frying.  Avoid drinking alcohol.  Avoid drinking large amounts of liquids with your meals.  Avoid bending over or lying down until 2-3 hours after eating. WHAT FOODS ARE NOT RECOMMENDED? The following are some foods and drinks that may worsen your symptoms: Vegetables Tomatoes. Tomato juice. Tomato and spaghetti sauce. Chili peppers. Onion and garlic. Horseradish. Fruits Oranges, grapefruit, and lemon (fruit and juice). Meats High-fat meats, fish, and poultry. This includes hot dogs, ribs, ham, sausage, salami, and bacon. Dairy Whole milk and chocolate milk. Sour cream. Cream. Butter. Ice cream. Cream cheese.  Beverages Coffee and tea, with or without caffeine. Carbonated beverages or energy drinks. Condiments Hot sauce. Barbecue sauce.  Sweets/Desserts Chocolate and cocoa. Donuts. Peppermint and spearmint. Fats and Oils High-fat foods, including French fries and potato chips. Other Vinegar. Strong spices, such as black pepper, white pepper, red pepper, cayenne, curry powder, cloves, ginger, and chili powder. The items listed above may not be a complete list of foods and beverages to avoid. Contact your dietitian for more  information.   This information is not intended to replace advice given to you by your health care provider. Make sure you discuss any questions you have with your health care provider.   Document Released: 02/12/2005 Document Revised: 03/05/2014 Document Reviewed: 12/17/2012 Elsevier Interactive Patient Education 2016 Elsevier Inc.  

## 2015-02-25 ENCOUNTER — Telehealth: Payer: Self-pay

## 2015-02-25 LAB — RPR

## 2015-02-25 LAB — HIV ANTIBODY (ROUTINE TESTING W REFLEX): HIV: NONREACTIVE

## 2015-02-25 NOTE — Telephone Encounter (Signed)
Patient not available at this time Message left on voice mail to return our call 

## 2015-02-25 NOTE — Telephone Encounter (Signed)
-----   Message from Ambrose FinlandValerie A Keck, NP sent at 02/25/2015 12:04 PM EST ----- HIV and syphilis is negative. Still waiting on urine STD to come back

## 2015-02-26 LAB — URINE CYTOLOGY ANCILLARY ONLY
Chlamydia: NEGATIVE
Neisseria Gonorrhea: NEGATIVE
TRICH (WINDOWPATH): NEGATIVE

## 2015-03-01 ENCOUNTER — Telehealth: Payer: Self-pay

## 2015-03-01 NOTE — Telephone Encounter (Signed)
Patient not available Message left on voice mail to return our call 

## 2015-03-01 NOTE — Telephone Encounter (Signed)
Returned patient phone call Patient not available Message left on voice mail to return our call 

## 2015-03-01 NOTE — Telephone Encounter (Signed)
-----   Message from Ambrose FinlandValerie A Keck, NP sent at 03/01/2015  2:07 PM EST ----- GC/Chlamydia/Trich negative

## 2015-03-01 NOTE — Telephone Encounter (Signed)
Patient  Returned phone call and is aware of his lab results 

## 2015-03-01 NOTE — Telephone Encounter (Signed)
Pt. Returned call. Please f/u with pt. °

## 2015-03-02 ENCOUNTER — Emergency Department (HOSPITAL_COMMUNITY)
Admission: EM | Admit: 2015-03-02 | Discharge: 2015-03-02 | Disposition: A | Discharge status: Home / Self Care | Payer: Self-pay | Attending: Emergency Medicine | Admitting: Emergency Medicine

## 2015-03-02 ENCOUNTER — Encounter (HOSPITAL_COMMUNITY): Payer: Self-pay | Admitting: Emergency Medicine

## 2015-03-02 DIAGNOSIS — J45909 Unspecified asthma, uncomplicated: Secondary | ICD-10-CM | POA: Insufficient documentation

## 2015-03-02 DIAGNOSIS — Z87891 Personal history of nicotine dependence: Secondary | ICD-10-CM | POA: Insufficient documentation

## 2015-03-02 DIAGNOSIS — Z79899 Other long term (current) drug therapy: Secondary | ICD-10-CM | POA: Insufficient documentation

## 2015-03-02 DIAGNOSIS — I10 Essential (primary) hypertension: Secondary | ICD-10-CM | POA: Insufficient documentation

## 2015-03-02 DIAGNOSIS — K219 Gastro-esophageal reflux disease without esophagitis: Secondary | ICD-10-CM | POA: Insufficient documentation

## 2015-03-02 DIAGNOSIS — F419 Anxiety disorder, unspecified: Secondary | ICD-10-CM | POA: Insufficient documentation

## 2015-03-02 DIAGNOSIS — J069 Acute upper respiratory infection, unspecified: Secondary | ICD-10-CM | POA: Insufficient documentation

## 2015-03-02 MED ORDER — DM-GUAIFENESIN ER 30-600 MG PO TB12: 1.0000 | ORAL_TABLET | Freq: Two times a day (BID) | ORAL | Status: DC

## 2015-03-02 NOTE — Discharge Instructions (Signed)
There does not appear to be an emergent cause her symptoms at this time. He will likely experiencing a URI related to a viral infection. There is showed resolving her own. You may take your medications as prescribed. It is important to follow up with your PCP in the next 2-3 days for reevaluation and blood pressure management. Return to ED for any new or worsening symptoms.  Upper Respiratory Infection, Adult Most upper respiratory infections (URIs) are caused by a virus. A URI affects the nose, throat, and upper air passages. The most common type of URI is often called "the common cold." HOME CARE   Take medicines only as told by your doctor.  Gargle warm saltwater or take cough drops to comfort your throat as told by your doctor.  Use a warm mist humidifier or inhale steam from a shower to increase air moisture. This may make it easier to breathe.  Drink enough fluid to keep your pee (urine) clear or pale yellow.  Eat soups and other clear broths.  Have a healthy diet.  Rest as needed.  Go back to work when your fever is gone or your doctor says it is okay.  You may need to stay home longer to avoid giving your URI to others.  You can also wear a face mask and wash your hands often to prevent spread of the virus.  Use your inhaler more if you have asthma.  Do not use any tobacco products, including cigarettes, chewing tobacco, or electronic cigarettes. If you need help quitting, ask your doctor. GET HELP IF:  You are getting worse, not better.  Your symptoms are not helped by medicine.  You have chills.  You are getting more short of breath.  You have brown or red mucus.  You have yellow or brown discharge from your nose.  You have pain in your face, especially when you bend forward.  You have a fever.  You have puffy (swollen) neck glands.  You have pain while swallowing.  You have white areas in the back of your throat. GET HELP RIGHT AWAY IF:   You have very  bad or constant:  Headache.  Ear pain.  Pain in your forehead, behind your eyes, and over your cheekbones (sinus pain).  Chest pain.  You have long-lasting (chronic) lung disease and any of the following:  Wheezing.  Long-lasting cough.  Coughing up blood.  A change in your usual mucus.  You have a stiff neck.  You have changes in your:  Vision.  Hearing.  Thinking.  Mood. MAKE SURE YOU:   Understand these instructions.  Will watch your condition.  Will get help right away if you are not doing well or get worse.   This information is not intended to replace advice given to you by your health care provider. Make sure you discuss any questions you have with your health care provider.   Document Released: 08/01/2007 Document Revised: 06/29/2014 Document Reviewed: 05/20/2013 Elsevier Interactive Patient Education [employer] Elsevier Inc.

## 2015-03-02 NOTE — ED Provider Notes (Signed)
CSN: [id number]     Arrival date & time 03/02/15  1104 History  By signing my name below, I, Doreatha Martin, attest that this documentation has been prepared under the direction and in the presence of  [employer], PA-C. Electronically Signed: Doreatha Martin, ED Scribe. 03/02/2015. 12:18 PM.    Chief Complaint  Patient presents with  . Fever  . Cough   The history is provided by the patient. No language interpreter was used.    HPI Comments: Walter Howell is a 30 y.o. male with h/o HTN, asthma, GERD who presents to the Emergency Department complaining of moderate, intermittent productive cough with green phlegm onset 3 days ago with associated chills, diaphoresis, nasal and chest congestion, nausea, rhinorrhea, post-nasal drip. Pt states he has been taking Advil, alkaseltzer and cough syrup with no relief. He denies fever, emesis, abdominal pain, diarrhea, shortness of breath or chest pain, additional symptoms.   Past Medical History  Diagnosis Date  . Asthma   . Hypertension   . Anxiety   . GERD (gastroesophageal reflux disease)    No past surgical history on file. Family History  Problem Relation Age of Onset  . Diabetes Maternal Grandmother   . Hypertension Maternal Grandmother   . Diabetes Maternal Grandfather   . Diabetes Paternal Grandmother   . Heart disease Paternal Grandmother   . Diabetes Paternal Grandfather    Social History  Substance Use Topics  . Smoking status: Former Games developer  . Smokeless tobacco: None  . Alcohol Use: No    Review of Systems A 10 point review of systems was completed and was negative except for pertinent positives and negatives as mentioned in the history of present illness.   Allergies  Review of patient's allergies indicates no known allergies.  Home Medications   Prior to Admission medications   Medication Sig Start Date End Date Taking? Authorizing Provider  acetaminophen (TYLENOL) 500 MG tablet Take 1,000 mg by mouth every 6  (six) hours as needed for moderate pain or headache.    Historical Provider, MD  albuterol (PROVENTIL HFA;VENTOLIN HFA) 108 (90 BASE) MCG/ACT inhaler Inhale 2 puffs into the lungs every 6 (six) hours as needed for wheezing or shortness of breath.     Historical Provider, MD  amLODipine (NORVASC) 10 MG tablet Take 1 tablet (10 mg total) by mouth daily. 02/24/15   Ambrose Finland, NP  fluticasone (FLONASE) 50 MCG/ACT nasal spray Place 2 sprays into both nostrils daily. Patient not taking: Reported on 09/17/2014 07/19/14   Doris Cheadle, MD  hydrOXYzine (ATARAX/VISTARIL) 25 MG tablet Take 1 tablet (25 mg total) by mouth every 6 (six) hours. 09/17/14   Hanna Patel-Mills, PA-C  Multiple Vitamin (MULTIVITAMIN WITH MINERALS) TABS tablet Take 1 tablet by mouth daily.    Historical Provider, MD  omeprazole (PRILOSEC) 20 MG capsule Take 1 capsule (20 mg total) by mouth daily before breakfast. 02/24/15   Ambrose Finland, NP  ondansetron (ZOFRAN ODT) 4 MG disintegrating tablet Take 1 tablet (4 mg total) by mouth every 8 (eight) hours as needed for nausea. 01/19/15   Mady Gemma, PA-C  ranitidine (ZANTAC) 150 MG tablet Take 150 mg by mouth 2 (two) times daily as needed for heartburn.     Historical Provider, MD   BP 176/81 mmHg  Pulse 71  Temp(Src) 97.7 F (36.5 C) (Oral)  Resp 18  SpO2 100% Physical Exam  Constitutional: He is oriented to person, place, and time. He appears well-developed and well-nourished.  Awake, alert, nontoxic appearance.    HENT:  Head: Normocephalic and atraumatic.  Eyes: Conjunctivae and EOM are normal. Pupils are equal, round, and reactive to light.  Neck: Normal range of motion. Neck supple.  Cardiovascular: Normal rate, regular rhythm and normal heart sounds.  Exam reveals no gallop and no friction rub.   No murmur heard. Heart sounds normal. RRR.    Pulmonary/Chest: Effort normal and breath sounds normal. No respiratory distress.  Lungs CTA bilaterally.   Abdominal:  He exhibits no distension.  Musculoskeletal: Normal range of motion.  Neurological: He is alert and oriented to person, place, and time.  Skin: Skin is warm and dry.  Psychiatric: He has a normal mood and affect. His behavior is normal.  Nursing note and vitals reviewed.  ED Course  Procedures (including critical care time) DIAGNOSTIC STUDIES: Oxygen Saturation is 100% on RA, normal by my interpretation.    COORDINATION OF CARE: 12:09 PM Discussed treatment plan with pt at bedside which includes Mucinex DM and pt agreed to plan.    MDM   Final diagnoses:  None   Filed Vitals:   03/02/15 1110  BP: 176/81  Pulse: 71  Temp: 97.7 F (36.5 C)  Resp: 18    Meds given in ED:  Medications - No data to display  New Prescriptions   DEXTROMETHORPHAN-GUAIFENESIN (MUCINEX DM) 30-600 MG 12HR TABLET    Take 1 tablet by mouth 2 (two) times daily.    Pt symptoms consistent with URI. CXR not indicated. Pt will be discharged with symptomatic treatment.  Discussed return precautions.  Pt is hemodynamically stable & in NAD prior to discharge. Discussed needs to follow-up with PCP for blood pressure management.  I personally performed the services described in this documentation, which was scribed in my presence. The recorded information has been reviewed and is accurate.   Joycie PeekBenjamin Jamyah Folk, PA-C 03/02/15 1409  Doug SouSam Jacubowitz, MD 03/02/15 1714

## 2015-03-02 NOTE — ED Notes (Signed)
Pt states that he has been having cough, fever, gen body aches, sore throat x 4 days.

## 2015-03-08 ENCOUNTER — Encounter: Payer: Self-pay | Admitting: Pharmacist

## 2015-03-21 MED FILL — AMLODIPINE BESYLATE 10 MG T: 10 | 30 days supply | Qty: 30 | Fill #0

## 2015-04-22 ENCOUNTER — Emergency Department (HOSPITAL_COMMUNITY)
Admission: EM | Admit: 2015-04-22 | Discharge: 2015-04-22 | Disposition: A | Discharge status: Home / Self Care | Payer: Self-pay | Attending: Emergency Medicine | Admitting: Emergency Medicine

## 2015-04-22 ENCOUNTER — Encounter (HOSPITAL_COMMUNITY): Payer: Self-pay

## 2015-04-22 DIAGNOSIS — Z87891 Personal history of nicotine dependence: Secondary | ICD-10-CM | POA: Insufficient documentation

## 2015-04-22 DIAGNOSIS — R110 Nausea: Secondary | ICD-10-CM | POA: Insufficient documentation

## 2015-04-22 DIAGNOSIS — Z79899 Other long term (current) drug therapy: Secondary | ICD-10-CM | POA: Insufficient documentation

## 2015-04-22 DIAGNOSIS — I10 Essential (primary) hypertension: Secondary | ICD-10-CM | POA: Insufficient documentation

## 2015-04-22 DIAGNOSIS — R630 Anorexia: Secondary | ICD-10-CM | POA: Insufficient documentation

## 2015-04-22 DIAGNOSIS — K219 Gastro-esophageal reflux disease without esophagitis: Secondary | ICD-10-CM | POA: Insufficient documentation

## 2015-04-22 DIAGNOSIS — F419 Anxiety disorder, unspecified: Secondary | ICD-10-CM | POA: Insufficient documentation

## 2015-04-22 DIAGNOSIS — J45909 Unspecified asthma, uncomplicated: Secondary | ICD-10-CM | POA: Insufficient documentation

## 2015-04-22 MED ORDER — ONDANSETRON 4 MG PO TBDP
4.0000 mg | ORAL_TABLET | Freq: Once | ORAL | Status: AC
  Administered 2015-04-22: 4 mg via ORAL
  Filled 2015-04-22: qty 1

## 2015-04-22 MED ORDER — ONDANSETRON HCL 4 MG PO TABS: 4.0000 mg | ORAL_TABLET | Freq: Four times a day (QID) | ORAL | Status: DC

## 2015-04-22 NOTE — ED Notes (Signed)
Patient c/o intermittent nausea x 2-3 days. Patient states he has been around co-workers who have had the stomach virus. Patient denies vomiting, diarrhea, or fever.

## 2015-04-22 NOTE — ED Notes (Signed)
Bed: WA28 Expected date:  Expected time:  Means of arrival:  Comments: 

## 2015-04-22 NOTE — ED Provider Notes (Signed)
CSN: [id number]     Arrival date & time 04/22/15  1017 History   First MD Initiated Contact with Patient 04/22/15 1122     Chief Complaint  Patient presents with  . Nausea     (Consider location/radiation/quality/duration/timing/severity/associated sxs/prior Treatment) HPI Comments: Patient presents today with a chief complaint of nausea.  He reports that he has felt nauseous for the past 2-3 days.  Symptoms gradually worsening.  He reports decreased appetite, but states that he has been drinking lots of fluid without difficulty.  He denies fever, chills, vomiting, diarrhea, urinary symptoms, or abdominal pain.  He states that several of his coworkers have had vomiting and diarrhea.  He has not taken anything for his symptoms prior to arrival.  The history is provided by the patient.    Past Medical History  Diagnosis Date  . Asthma   . Hypertension   . Anxiety   . GERD (gastroesophageal reflux disease)    History reviewed. No pertinent past surgical history. Family History  Problem Relation Age of Onset  . Diabetes Maternal Grandmother   . Hypertension Maternal Grandmother   . Diabetes Maternal Grandfather   . Diabetes Paternal Grandmother   . Heart disease Paternal Grandmother   . Diabetes Paternal Grandfather    Social History  Substance Use Topics  . Smoking status: Former Games developer  . Smokeless tobacco: Never Used  . Alcohol Use: No    Review of Systems  All other systems reviewed and are negative.     Allergies  Review of patient's allergies indicates no known allergies.  Home Medications   Prior to Admission medications   Medication Sig Start Date End Date Taking? Authorizing Provider  acetaminophen (TYLENOL) 500 MG tablet Take 1,000 mg by mouth every 6 (six) hours as needed for moderate pain or headache.    Historical Provider, MD  albuterol (PROVENTIL HFA;VENTOLIN HFA) 108 (90 BASE) MCG/ACT inhaler Inhale 2 puffs into the lungs every 6 (six) hours as needed  for wheezing or shortness of breath.     Historical Provider, MD  amLODipine (NORVASC) 10 MG tablet Take 1 tablet (10 mg total) by mouth daily. 02/24/15   Ambrose Finland, NP  dextromethorphan-guaiFENesin [facility] DM) 30-600 MG 12hr tablet Take 1 tablet by mouth 2 (two) times daily. 03/02/15   Joycie Peek, PA-C  fluticasone (FLONASE) 50 MCG/ACT nasal spray Place 2 sprays into both nostrils daily. Patient not taking: Reported on 09/17/2014 07/19/14   Doris Cheadle, MD  hydrOXYzine (ATARAX/VISTARIL) 25 MG tablet Take 1 tablet (25 mg total) by mouth every 6 (six) hours. 09/17/14   Hanna Patel-Mills, PA-C  Multiple Vitamin (MULTIVITAMIN WITH MINERALS) TABS tablet Take 1 tablet by mouth daily.    Historical Provider, MD  omeprazole (PRILOSEC) 20 MG capsule Take 1 capsule (20 mg total) by mouth daily before breakfast. 02/24/15   Ambrose Finland, NP  ondansetron (ZOFRAN ODT) 4 MG disintegrating tablet Take 1 tablet (4 mg total) by mouth every 8 (eight) hours as needed for nausea. 01/19/15   Mady Gemma, PA-C  ranitidine (ZANTAC) 150 MG tablet Take 150 mg by mouth 2 (two) times daily as needed for heartburn.     Historical Provider, MD   BP 141/78 mmHg  Pulse 64  Temp(Src) 97.9 F (36.6 C) (Oral)  Resp 16  Ht  (1.803 m)  SpO2 98% Physical Exam  Constitutional: He appears well-developed and well-nourished. No distress.  HENT:  Head: Normocephalic and atraumatic.  Mouth/Throat: Oropharynx is clear  and moist.  Neck: Normal range of motion. Neck supple.  Cardiovascular: Normal rate, regular rhythm and normal heart sounds.   Pulmonary/Chest: Effort normal and breath sounds normal.  Abdominal: Soft. Bowel sounds are normal. He exhibits no distension and no mass. There is no tenderness. There is no rebound and no guarding.  Musculoskeletal: Normal range of motion.  Neurological: He is alert.  Skin: Skin is warm and dry. He is not diaphoretic.  Psychiatric: He has a normal mood and affect.   Nursing note and vitals reviewed.   ED Course  Procedures (including critical care time) Labs Review Labs Reviewed - No data to display  Imaging Review No results found. I have personally reviewed and evaluated these images and lab results as part of my medical decision-making.   EKG Interpretation None      MDM   Final diagnoses:  None   Patient presents today with nausea x 2-3 days.  No fever, vomiting, diarrhea, or abdominal pain.  Patient tolerating PO liquids.  Patient given Zofran in the ED with improvement of symptoms.  Patient discharged home with Rx for Zofran.  Stable for discharge.  Return precautions given.    Santiago Glad, PA-C 04/22/15 1328  Azalia Bilis, MD 04/22/15 [phone]

## 2015-06-06 MED FILL — AMLODIPINE BESYLATE 10 MG T: 10 | 30 days supply | Qty: 30 | Fill #1

## 2015-08-09 MED FILL — ?AMLODIPINE BESYLATE 10 MG: 10 | 30 days supply | Qty: 30 | Fill #2

## 2015-09-05 ENCOUNTER — Emergency Department (HOSPITAL_COMMUNITY)
Admission: EM | Admit: 2015-09-05 | Discharge: 2015-09-05 | Discharge status: Against Medical Advice | Payer: Self-pay | Attending: Emergency Medicine | Admitting: Emergency Medicine

## 2015-09-05 ENCOUNTER — Encounter (HOSPITAL_COMMUNITY): Payer: Self-pay | Admitting: Emergency Medicine

## 2015-09-05 DIAGNOSIS — R002 Palpitations: Secondary | ICD-10-CM | POA: Insufficient documentation

## 2015-09-05 DIAGNOSIS — F419 Anxiety disorder, unspecified: Secondary | ICD-10-CM | POA: Insufficient documentation

## 2015-09-05 DIAGNOSIS — J45909 Unspecified asthma, uncomplicated: Secondary | ICD-10-CM | POA: Insufficient documentation

## 2015-09-05 DIAGNOSIS — Z87891 Personal history of nicotine dependence: Secondary | ICD-10-CM | POA: Insufficient documentation

## 2015-09-05 DIAGNOSIS — I10 Essential (primary) hypertension: Secondary | ICD-10-CM | POA: Insufficient documentation

## 2015-09-05 DIAGNOSIS — Z79899 Other long term (current) drug therapy: Secondary | ICD-10-CM | POA: Insufficient documentation

## 2015-09-05 LAB — CBC WITH DIFFERENTIAL/PLATELET
Basophils Absolute: 0 10*3/uL (ref 0.0–0.1)
Basophils Relative: 0 %
EOS PCT: 1 %
Eosinophils Absolute: 0.1 10*3/uL (ref 0.0–0.7)
HCT: 47.9 % (ref 39.0–52.0)
Hemoglobin: 16.8 g/dL (ref 13.0–17.0)
LYMPHS PCT: 34 %
Lymphs Abs: 1.8 10*3/uL (ref 0.7–4.0)
MCH: 31.6 pg (ref 26.0–34.0)
MCHC: 35.1 g/dL (ref 30.0–36.0)
MCV: 90.2 fL (ref 78.0–100.0)
MONO ABS: 0.2 10*3/uL (ref 0.1–1.0)
Monocytes Relative: 4 %
NEUTROS ABS: 3.3 10*3/uL (ref 1.7–7.7)
Neutrophils Relative %: 61 %
Platelets: 199 10*3/uL (ref 150–400)
RBC: 5.31 MIL/uL (ref 4.22–5.81)
RDW: 13.6 % (ref 11.5–15.5)
WBC: 5.4 10*3/uL (ref 4.0–10.5)

## 2015-09-05 LAB — BASIC METABOLIC PANEL
Anion gap: 8 (ref 5–15)
BUN: 14 mg/dL (ref 6–20)
CALCIUM: 9.2 mg/dL (ref 8.9–10.3)
CO2: 24 mmol/L (ref 22–32)
CREATININE: 0.82 mg/dL (ref 0.61–1.24)
Chloride: 106 mmol/L (ref 101–111)
GFR calc Af Amer: >60 mL/min (ref >60)
GFR calc non Af Amer: >60 mL/min (ref >60)
GLUCOSE: 131 mg/dL — AB (ref 65–99)
Potassium: 3.6 mmol/L (ref 3.5–5.1)
Sodium: 138 mmol/L (ref 135–145)

## 2015-09-05 NOTE — ED Notes (Addendum)
Pt c/o feeling anxious x 1 month. Pt reports "feeling shaky and nervous." States he wants medicine for his anxiety.

## 2015-09-05 NOTE — ED Provider Notes (Signed)
CSN: [id number]     Arrival date & time 09/05/15  1125 History   First MD Initiated Contact with Patient 09/05/15 1227     Chief Complaint  Patient presents with  . Anxiety   HPI  Mr. Walter Howell is a 30 year old male with PMHx of HTN, asthma, GERD and anxiety presenting with anxiety. Patient reports a history of anxiety and panic attacks that have been worsening over the past month. He complains of feeling increasingly anxious with sensation of heart racing and body shaking. He states that he feels these sensations when he is under stress or receives bad news. He describes this as his "panic attacks" and states that they are scaring him because they are occurring almost daily now. He states that he has a primary care doctor and has discussed his anxiety with them but they have not put him on medications or referred him to behavioral health. Denies feelings of depression, suicidal ideation, homicidal ideation or hallucinations. Denies fevers, chills, headache, dizziness, syncope, URI symptoms, chest pain, shortness of breath, abdominal pain, nausea, vomiting, weakness or numbness.  Past Medical History  Diagnosis Date  . Asthma   . Hypertension   . Anxiety   . GERD (gastroesophageal reflux disease)    History reviewed. No pertinent past surgical history. Family History  Problem Relation Age of Onset  . Diabetes Maternal Grandmother   . Hypertension Maternal Grandmother   . Diabetes Maternal Grandfather   . Diabetes Paternal Grandmother   . Heart disease Paternal Grandmother   . Diabetes Paternal Grandfather    Social History  Substance Use Topics  . Smoking status: Former Games developer  . Smokeless tobacco: Never Used  . Alcohol Use: No    Review of Systems  Cardiovascular: Positive for palpitations.  Psychiatric/Behavioral: The patient is nervous/anxious.   All other systems reviewed and are negative.     Allergies  Prednisone  Home Medications   Prior to Admission medications    Medication Sig Start Date End Date Taking? Authorizing Provider  acetaminophen (TYLENOL) 500 MG tablet Take 1,000 mg by mouth every 6 (six) hours as needed for moderate pain or headache.    Historical Provider, MD  albuterol (PROVENTIL HFA;VENTOLIN HFA) 108 (90 BASE) MCG/ACT inhaler Inhale 2 puffs into the lungs every 6 (six) hours as needed for wheezing or shortness of breath.     Historical Provider, MD  amLODipine (NORVASC) 10 MG tablet Take 1 tablet (10 mg total) by mouth daily. 02/24/15   Ambrose Finland, NP  dextromethorphan-guaiFENesin [facility] DM) 30-600 MG 12hr tablet Take 1 tablet by mouth 2 (two) times daily. 03/02/15   Joycie Peek, PA-C  fluticasone (FLONASE) 50 MCG/ACT nasal spray Place 2 sprays into both nostrils daily. Patient not taking: Reported on 09/17/2014 07/19/14   Doris Cheadle, MD  hydrOXYzine (ATARAX/VISTARIL) 25 MG tablet Take 1 tablet (25 mg total) by mouth every 6 (six) hours. 09/17/14   Hanna Patel-Mills, PA-C  Multiple Vitamin (MULTIVITAMIN WITH MINERALS) TABS tablet Take 1 tablet by mouth daily.    Historical Provider, MD  omeprazole (PRILOSEC) 20 MG capsule Take 1 capsule (20 mg total) by mouth daily before breakfast. 02/24/15   Ambrose Finland, NP  ondansetron (ZOFRAN ODT) 4 MG disintegrating tablet Take 1 tablet (4 mg total) by mouth every 8 (eight) hours as needed for nausea. 01/19/15   Mady Gemma, PA-C  ondansetron (ZOFRAN) 4 MG tablet Take 1 tablet (4 mg total) by mouth every 6 (six) hours. 04/22/15  Heather Laisure, PA-C  ranitidine (ZANTAC) 150 MG tablet Take 150 mg by mouth 2 (two) times daily as needed for heartburn.     Historical Provider, MD   BP 151/97 mmHg  Pulse 67  Temp(Src) 97.9 F (36.6 C) (Oral)  Resp 18  Ht 5\' 11"  (1.803 m)  Wt 115.849 kg  BMI 35.64 kg/m2  SpO2 100% Physical Exam  Constitutional: He is oriented to person, place, and time. He appears well-developed and well-nourished. No distress.  HENT:  Head: Normocephalic and  atraumatic.  Mouth/Throat: Oropharynx is clear and moist.  Eyes: Conjunctivae and EOM are normal. Pupils are equal, round, and reactive to light. Right eye exhibits no discharge. Left eye exhibits no discharge. No scleral icterus.  Neck: Normal range of motion.  Cardiovascular: Normal rate, regular rhythm and normal heart sounds.   Pulmonary/Chest: Effort normal and breath sounds normal. No respiratory distress. He has no wheezes.  Abdominal: Soft. He exhibits no distension. There is no tenderness.  Musculoskeletal: Normal range of motion.  Neurological: He is alert and oriented to person, place, and time. Coordination normal.  Skin: Skin is warm and dry.  Psychiatric: He has a normal mood and affect. His behavior is normal.  Nursing note and vitals reviewed.   ED Course  Procedures (including critical care time) Labs Review Labs Reviewed  BASIC METABOLIC PANEL - Abnormal; Notable for the following:    Glucose, Bld 131 (*)    All other components within normal limits  CBC WITH DIFFERENTIAL/PLATELET    Imaging Review No results found. I have personally reviewed and evaluated these images and lab results as part of my medical decision-making.   EKG Interpretation   Date/Time:  Monday September 05 2015 13:38:56 EDT Ventricular Rate:  59 PR Interval:  184 QRS Duration: 96 QT Interval:  388 QTC Calculation: 384 R Axis:   68 Text Interpretation:  Sinus bradycardia with sinus arrhythmia ST  elevation, consider early repolarization, pericarditis, or injury Abnormal  ECG No significant change since last tracing Confirmed by KNAPP  MD-J, JON  ([id number]) on 09/05/2015 1:50:47 PM      MDM   Final diagnoses:  Anxiety   Pt presenting with worsening anxiety x 1 month. No depression, SI, HI or AVH. No physical complaints. Afebrile and hemodynamically stable. Benign physical exam. Discussed with pt that we will get screening labs and ECG. Upon returning from another pt's room, nurse informed  me that pt had left AMA.    Alveta HeimlichStevi Owain Eckerman, PA-C 09/05/15 1515  Linwood DibblesJon Knapp, MD 09/05/15 [phone]

## 2015-09-05 NOTE — ED Notes (Addendum)
Pt states the process was taking too long and that he is "ready to go."  It was explained to the pt that there are risks in leaving prior to being properly screened by the our providers.  Pt expressed understanding and signed the AMA form.  Pt ambulated out of the department by himself with a steady gait. ABCD intact. Pt in no acute distress. PA made aware.

## 2015-09-28 ENCOUNTER — Ambulatory Visit (HOSPITAL_COMMUNITY)
Admission: EM | Admit: 2015-09-28 | Discharge: 2015-09-28 | Disposition: A | Discharge status: Home / Self Care | Payer: Self-pay | Attending: Family Medicine | Admitting: Family Medicine

## 2015-09-28 ENCOUNTER — Encounter (HOSPITAL_COMMUNITY): Payer: Self-pay | Admitting: Emergency Medicine

## 2015-09-28 DIAGNOSIS — R110 Nausea: Secondary | ICD-10-CM

## 2015-09-28 DIAGNOSIS — J069 Acute upper respiratory infection, unspecified: Secondary | ICD-10-CM

## 2015-09-28 MED ORDER — AMOXICILLIN 250 MG PO CAPS: 250.0000 mg | ORAL_CAPSULE | Freq: Two times a day (BID) | ORAL | 0 refills | Status: DC

## 2015-09-28 MED ORDER — ONDANSETRON HCL 4 MG PO TABS: 4.0000 mg | ORAL_TABLET | Freq: Four times a day (QID) | ORAL | 0 refills | Status: DC

## 2015-09-28 NOTE — ED Triage Notes (Signed)
PT reports facial pain and pressure, productive cough with green sputum for 1 week. PT reports nausea for 2 days. PT believes he has a sinus infection

## 2015-09-28 NOTE — ED Provider Notes (Signed)
CSN: [id number]     Arrival date & time 09/28/15  1052 History   First MD Initiated Contact with Patient 09/28/15 1225     Chief Complaint  Patient presents with  . Facial Pain   (Consider location/radiation/quality/duration/timing/severity/associated sxs/prior Treatment) HPI 30 Y/O MALE WITH COUGH, BODY ACHES FOR 4-5 DAYS, NAUSEA SINCE YESTERDAY.  NO REAL TREATMENT AT HOME, TAKING GINGER ALE. NO FEVER, TICK BITES, OR FEVER.  Past Medical History:  Diagnosis Date  . Anxiety   . Asthma   . GERD (gastroesophageal reflux disease)   . Hypertension    History reviewed. No pertinent surgical history. Family History  Problem Relation Age of Onset  . Diabetes Maternal Grandmother   . Hypertension Maternal Grandmother   . Diabetes Maternal Grandfather   . Diabetes Paternal Grandmother   . Heart disease Paternal Grandmother   . Diabetes Paternal Grandfather    Social History  Substance Use Topics  . Smoking status: Former Games developer  . Smokeless tobacco: Never Used  . Alcohol use No    Review of Systems  Denies: HEADACHE,   ABDOMINAL PAIN, CHEST PAIN, CONGESTION, DYSURIA, SHORTNESS OF BREATH  Allergies  Prednisone  Home Medications   Prior to Admission medications   Medication Sig Start Date End Date Taking? Authorizing Provider  acetaminophen (TYLENOL) 500 MG tablet Take 1,000 mg by mouth every 6 (six) hours as needed for moderate pain or headache.    Historical Provider, MD  albuterol (PROVENTIL HFA;VENTOLIN HFA) 108 (90 BASE) MCG/ACT inhaler Inhale 2 puffs into the lungs every 6 (six) hours as needed for wheezing or shortness of breath.     Historical Provider, MD  amLODipine (NORVASC) 10 MG tablet Take 1 tablet (10 mg total) by mouth daily. 02/24/15   Ambrose Finland, NP  amoxicillin (AMOXIL) 250 MG capsule Take 1 capsule (250 mg total) by mouth 2 (two) times daily. 09/28/15   Tharon Aquas, PA  dextromethorphan-guaiFENesin [facility] DM) 30-600 MG 12hr tablet Take 1 tablet by  mouth 2 (two) times daily. 03/02/15   Joycie Peek, PA-C  fluticasone (FLONASE) 50 MCG/ACT nasal spray Place 2 sprays into both nostrils daily. Patient not taking: Reported on 09/17/2014 07/19/14   Doris Cheadle, MD  hydrOXYzine (ATARAX/VISTARIL) 25 MG tablet Take 1 tablet (25 mg total) by mouth every 6 (six) hours. 09/17/14   Hanna Patel-Mills, PA-C  Multiple Vitamin (MULTIVITAMIN WITH MINERALS) TABS tablet Take 1 tablet by mouth daily.    Historical Provider, MD  omeprazole (PRILOSEC) 20 MG capsule Take 1 capsule (20 mg total) by mouth daily before breakfast. 02/24/15   Ambrose Finland, NP  ondansetron (ZOFRAN ODT) 4 MG disintegrating tablet Take 1 tablet (4 mg total) by mouth every 8 (eight) hours as needed for nausea. 01/19/15   Mady Gemma, PA-C  ondansetron (ZOFRAN) 4 MG tablet Take 1 tablet (4 mg total) by mouth every 6 (six) hours. 04/22/15   Heather Laisure, PA-C  ondansetron (ZOFRAN) 4 MG tablet Take 1 tablet (4 mg total) by mouth every 6 (six) hours. 09/28/15   Tharon Aquas, PA  ranitidine (ZANTAC) 150 MG tablet Take 150 mg by mouth 2 (two) times daily as needed for heartburn.     Historical Provider, MD   Meds Ordered and Administered this Visit  Medications - No data to display  BP 161/94   Pulse (!) 57   Temp 98.4 F (36.9 C) (Oral)   Resp 16   SpO2 96%  No data found.  Physical Exam NURSES NOTES AND VITAL SIGNS REVIEWED. CONSTITUTIONAL: Well developed, well nourished, no acute distress HEENT: normocephalic, atraumatic EYES: Conjunctiva normal NECK:normal ROM, supple, no adenopathy PULMONARY:No respiratory distress, normal effort ABDOMINAL: Soft, ND, NT BS+, No CVAT MUSCULOSKELETAL: Normal ROM of all extremities,  SKIN: warm and dry without rash PSYCHIATRIC: Mood and affect, behavior are normal  Urgent Care Course   Clinical Course    Procedures (including critical care time)  Labs Review Labs Reviewed - No data to display  Imaging Review No results  found.   Visual Acuity Review  Right Eye Distance:   Left Eye Distance:   Bilateral Distance:    Right Eye Near:   Left Eye Near:    Bilateral Near:        PT LOOKS WELL, TREAT SYMPTOMS. ZOFRAN.  RETURN TO WORK NOTE FOR FRIDAY MDM   1. URI (upper respiratory infection)   2. Nausea     Patient is reassured that there are no issues that require transfer to higher level of care at this time or additional tests. Patient is advised to continue home symptomatic treatment. Patient is advised that if there are new or worsening symptoms to attend the emergency department, contact primary care provider, or return to UC. Instructions of care provided discharged home in stable condition.    THIS NOTE WAS GENERATED USING A VOICE RECOGNITION SOFTWARE PROGRAM. ALL REASONABLE EFFORTS  WERE MADE TO PROOFREAD THIS DOCUMENT FOR ACCURACY.  I have verbally reviewed the discharge instructions with the patient. A printed AVS was given to the patient.  All questions were answered prior to discharge.      Tharon Aquas, PA 09/28/15 1918

## 2015-10-14 MED FILL — ?AMLODIPINE BESYLATE 10 MG: 10 | 30 days supply | Qty: 30 | Fill #3

## 2015-10-17 ENCOUNTER — Encounter (HOSPITAL_COMMUNITY): Payer: Self-pay | Admitting: Emergency Medicine

## 2015-10-17 ENCOUNTER — Emergency Department (HOSPITAL_COMMUNITY)
Admission: EM | Admit: 2015-10-17 | Discharge: 2015-10-17 | Disposition: A | Discharge status: Home / Self Care | Payer: Self-pay | Attending: Emergency Medicine | Admitting: Emergency Medicine

## 2015-10-17 DIAGNOSIS — Z87891 Personal history of nicotine dependence: Secondary | ICD-10-CM | POA: Insufficient documentation

## 2015-10-17 DIAGNOSIS — Z79899 Other long term (current) drug therapy: Secondary | ICD-10-CM | POA: Insufficient documentation

## 2015-10-17 DIAGNOSIS — I10 Essential (primary) hypertension: Secondary | ICD-10-CM | POA: Insufficient documentation

## 2015-10-17 DIAGNOSIS — Z7951 Long term (current) use of inhaled steroids: Secondary | ICD-10-CM | POA: Insufficient documentation

## 2015-10-17 DIAGNOSIS — K0889 Other specified disorders of teeth and supporting structures: Secondary | ICD-10-CM | POA: Insufficient documentation

## 2015-10-17 DIAGNOSIS — Z791 Long term (current) use of non-steroidal anti-inflammatories (NSAID): Secondary | ICD-10-CM | POA: Insufficient documentation

## 2015-10-17 DIAGNOSIS — Z792 Long term (current) use of antibiotics: Secondary | ICD-10-CM | POA: Insufficient documentation

## 2015-10-17 DIAGNOSIS — J45909 Unspecified asthma, uncomplicated: Secondary | ICD-10-CM | POA: Insufficient documentation

## 2015-10-17 MED ORDER — PENICILLIN V POTASSIUM 500 MG PO TABS: 500.0000 mg | ORAL_TABLET | Freq: Four times a day (QID) | ORAL | 0 refills | Status: AC

## 2015-10-17 NOTE — Discharge Instructions (Signed)
Take the penicillin as prescribed and be sure to complete the entire 7 day course. Take ibuprofen as needed for pain. Follow-up with a dentist tomorrow to have your tooth reevaluated. Return to the emergency department if your pain worsens, you experience swelling of your gums, difficulty opening your mouth, swelling of your throat, difficulty swallowing, fever chills or any other concerning symptoms.

## 2015-10-17 NOTE — ED Notes (Signed)
Patient presents with 1 week of left upper dental pain.  He denies N/V and fever.  Patient was seen here last year for pain in lower right mouth and went to a dentist at that time.  He has not followed up with dentist for current episode.  Patient has taken 800 mg Ibuprofen x2 without relief.    On exam, patient has dental filling to upper left pre-molar.  He states that is where his pain lies.  Some erythema noted to medial side of gums, no abscess noted.

## 2015-10-17 NOTE — ED Triage Notes (Signed)
Pt complaint of left upper dental pain for a week unrelieved by motrin.

## 2015-10-17 NOTE — ED Provider Notes (Signed)
[facility]-EMERGENCY DEPT Provider Note   CSN: [id number] Arrival date & time: 10/17/15  0800     History   Chief Complaint Chief Complaint  Patient presents with  . Dental Pain    HPI Walter Howell is a 30 y.o. male.  HPI   Patient is a 30 year old male with a history of anxiety, GERD, hypertension, asthma who presents the emergency department with progressively worsening left upper dental pain for 1 week. Pain is constant, radiates into his left temple, throbbing. Patient states he had similar pain in his left lower jaw last year and was given antibiotic and pain medication. Patient has tried 800 mg of ibuprofen without relief. Patient denies fever, chills, nausea, vomiting, difficulty opening his mouth, difficulty swallowing, sore throat.  Past Medical History:  Diagnosis Date  . Anxiety   . Asthma   . GERD (gastroesophageal reflux disease)   . Hypertension     Patient Active Problem List   Diagnosis Date Noted  . Essential hypertension, benign 07/08/2013  . Dental cavities 07/08/2013  . Pain, dental 07/08/2013    History reviewed. No pertinent surgical history.     Home Medications    Prior to Admission medications   Medication Sig Start Date End Date Taking? Authorizing Provider  amLODipine (NORVASC) 10 MG tablet Take 1 tablet (10 mg total) by mouth daily. 02/24/15  Yes Ambrose FinlandValerie A Keck, NP  amoxicillin (AMOXIL) 250 MG capsule Take 1 capsule (250 mg total) by mouth 2 (two) times daily. 09/28/15  Yes Tharon AquasFrank C Patrick, PA  cetirizine (ZYRTEC) 10 MG tablet Take 10 mg by mouth daily.   Yes Historical Provider, MD  ibuprofen (ADVIL,MOTRIN) 800 MG tablet Take 800 mg by mouth every 8 (eight) hours as needed for moderate pain.   Yes Historical Provider, MD  Multiple Vitamin (MULTIVITAMIN WITH MINERALS) TABS tablet Take 1 tablet by mouth daily.   Yes Historical Provider, MD  ranitidine (ZANTAC) 150 MG tablet Take 150 mg by mouth 2 (two) times daily as needed for heartburn.     Yes Historical Provider, MD  albuterol (PROVENTIL HFA;VENTOLIN HFA) 108 (90 BASE) MCG/ACT inhaler Inhale 2 puffs into the lungs every 6 (six) hours as needed for wheezing or shortness of breath.     Historical Provider, MD  dextromethorphan-guaiFENesin (MUCINEX DM) 30-600 MG 12hr tablet Take 1 tablet by mouth 2 (two) times daily. Patient not taking: Reported on 10/17/2015 03/02/15   Joycie PeekBenjamin Cartner, PA-C  fluticasone [facility](FLONASE) 50 MCG/ACT nasal spray Place 2 sprays into both nostrils daily. Patient not taking: Reported on 09/17/2014 07/19/14   Doris Cheadleeepak Advani, MD  hydrOXYzine (ATARAX/VISTARIL) 25 MG tablet Take 1 tablet (25 mg total) by mouth every 6 (six) hours. Patient not taking: Reported on 10/17/2015 09/17/14   Catha GosselinHanna Patel-Mills, PA-C  omeprazole (PRILOSEC) 20 MG capsule Take 1 capsule (20 mg total) by mouth daily before breakfast. Patient not taking: Reported on 10/17/2015 02/24/15   Ambrose FinlandValerie A Keck, NP  ondansetron (ZOFRAN ODT) 4 MG disintegrating tablet Take 1 tablet (4 mg total) by mouth every 8 (eight) hours as needed for nausea. Patient not taking: Reported on 10/17/2015 01/19/15   Mady GemmaElizabeth C Westfall, PA-C  ondansetron (ZOFRAN) 4 MG tablet Take 1 tablet (4 mg total) by mouth every 6 (six) hours. Patient not taking: Reported on 10/17/2015 04/22/15   Santiago GladHeather Laisure, PA-C  ondansetron (ZOFRAN) 4 MG tablet Take 1 tablet (4 mg total) by mouth every 6 (six) hours. Patient not taking: Reported on 10/17/2015 09/28/15   Homero FellersFrank  Baldwin Crown, PA  penicillin v potassium (VEETID) 500 MG tablet Take 1 tablet (500 mg total) by mouth 4 (four) times daily. 10/17/15 10/24/15  Walter Simon, PA    Family History Family History  Problem Relation Age of Onset  . Diabetes Maternal Grandmother   . Hypertension Maternal Grandmother   . Diabetes Maternal Grandfather   . Diabetes Paternal Grandmother   . Heart disease Paternal Grandmother   . Diabetes Paternal Grandfather     Social History Social History    Substance Use Topics  . Smoking status: Former Games developer  . Smokeless tobacco: Never Used  . Alcohol use No     Allergies   Prednisone   Review of Systems Review of Systems  Constitutional: Negative for chills and fever.  HENT: Positive for dental problem. Negative for facial swelling, sore throat and trouble swallowing.   Respiratory: Negative for cough.   Gastrointestinal: Negative for abdominal pain, nausea and vomiting.     Physical Exam Updated Vital Signs BP 146/85 (BP Location: Right Arm)   Pulse 70   Temp 97.6 F (36.4 C) (Oral)   Resp 16   SpO2 98%   Physical Exam  Constitutional: He appears well-developed and well-nourished. No distress.  HENT:  Head: Normocephalic and atraumatic.  Mouth/Throat: Uvula is midline, oropharynx is clear and moist and mucous membranes are normal. No trismus in the jaw. No dental abscesses or uvula swelling.    Second premolar or first molar with silver filling, no broken tooth noted, no surrounding gingival swelling, erythema, or dental abscess noted, no area of fluctuance, no trismus, no sublingual swelling or tenderness  Eyes: Conjunctivae are normal.  Pulmonary/Chest: Effort normal. No respiratory distress.  Musculoskeletal: Normal range of motion.  Neurological: He is alert. Coordination normal.  Skin: Skin is warm and dry. He is not diaphoretic.  Psychiatric: He has a normal mood and affect. His behavior is normal.  Nursing note and vitals reviewed.    ED Treatments / Results  Labs (all labs ordered are listed, but only abnormal results are displayed) Labs Reviewed - No data to display  EKG  EKG Interpretation None       Radiology No results found.  Procedures Procedures (including critical care time)  Medications Ordered in ED Medications - No data to display   Initial Impression / Assessment and Plan / ED Course  I have reviewed the triage vital signs and the nursing notes.  Pertinent labs & imaging  results that were available during my care of the patient were reviewed by me and considered in my medical decision making (see chart for details).  Clinical Course    Patient with dentalgia.  No abscess requiring immediate incision and drainage.  Pt afebrile and exam not concerning for Ludwig's angina or pharyngeal abscess.  Will treat with penicillin. Pt instructed to follow-up with dentist.  Discussed return precautions. Pt very upset when I explained that we do not prescribe narcotics for dental pain. Pt requesting work note. Discussed strict return precautions. Pt safe for discharge.   Final Clinical Impressions(s) / ED Diagnoses   Final diagnoses:  Pain, dental    New Prescriptions Discharge Medication List as of 10/17/2015  9:47 AM    START taking these medications   Details  penicillin v potassium (VEETID) 500 MG tablet Take 1 tablet (500 mg total) by mouth 4 (four) times daily., Starting Mon 10/17/2015, Until Mon 10/24/2015, Print         Walter Simon, PA  10/17/15 1035    Shaune Pollackameron Isaacs, MD 10/18/15 2047

## 2015-11-21 MED FILL — AMLODIPINE BESYLATE 10 MG T: 10 | 30 days supply | Qty: 30 | Fill #4

## 2015-12-06 ENCOUNTER — Ambulatory Visit: Payer: Self-pay | Attending: Family Medicine | Admitting: Family Medicine

## 2015-12-06 ENCOUNTER — Encounter: Payer: Self-pay | Admitting: Family Medicine

## 2015-12-06 VITALS — BP 148/88 | HR 78 | Temp 98.8°F | Ht 71.0 in | Wt 255.2 lb

## 2015-12-06 DIAGNOSIS — M545 Low back pain, unspecified: Secondary | ICD-10-CM | POA: Insufficient documentation

## 2015-12-06 DIAGNOSIS — F32A Depression, unspecified: Secondary | ICD-10-CM

## 2015-12-06 DIAGNOSIS — I10 Essential (primary) hypertension: Secondary | ICD-10-CM

## 2015-12-06 DIAGNOSIS — F418 Other specified anxiety disorders: Secondary | ICD-10-CM

## 2015-12-06 DIAGNOSIS — G8929 Other chronic pain: Secondary | ICD-10-CM

## 2015-12-06 DIAGNOSIS — F329 Major depressive disorder, single episode, unspecified: Secondary | ICD-10-CM | POA: Insufficient documentation

## 2015-12-06 DIAGNOSIS — Z113 Encounter for screening for infections with a predominantly sexual mode of transmission: Secondary | ICD-10-CM

## 2015-12-06 DIAGNOSIS — F419 Anxiety disorder, unspecified: Secondary | ICD-10-CM

## 2015-12-06 MED ORDER — DULOXETINE HCL 20 MG PO CPEP: 20.0000 mg | ORAL_CAPSULE | Freq: Every day | ORAL | 2 refills | Status: DC

## 2015-12-06 MED ORDER — AMLODIPINE BESYLATE 10 MG PO TABS: 10.0000 mg | ORAL_TABLET | Freq: Every day | ORAL | 11 refills | Status: DC

## 2015-12-06 MED ORDER — MELOXICAM 7.5 MG PO TABS: 7.5000 mg | ORAL_TABLET | Freq: Every day | ORAL | 2 refills | Status: DC

## 2015-12-06 MED ORDER — METHOCARBAMOL 500 MG PO TABS: 500.0000 mg | ORAL_TABLET | Freq: Three times a day (TID) | ORAL | 2 refills | Status: DC | PRN

## 2015-12-06 NOTE — Patient Instructions (Addendum)
Apolinar JunesBrandon was seen today for back pain and anxiety.  Diagnoses and all orders for this visit:  Routine screening for STI (sexually transmitted infection) -     HIV antibody (with reflex) -     RPR -     Urine cytology ancillary only  Anxiety and depression -     DULoxetine (CYMBALTA) 20 MG capsule; Take 1 capsule (20 mg total) by mouth daily.  Chronic bilateral low back pain without sciatica -     methocarbamol (ROBAXIN) 500 MG tablet; Take 1 tablet (500 mg total) by mouth every 8 (eight) hours as needed for muscle spasms. -     meloxicam (MOBIC) 7.5 MG tablet; Take 1 tablet (7.5 mg total) by mouth daily.    F/u in 3-4 weeks for depression and anxiety   Dr. Armen PickupFunches

## 2015-12-06 NOTE — Progress Notes (Signed)
Subjective:  Patient ID: Walter Howell, male    DOB: 14-Apr-1985  Age: 30 y.o. MRN: [id number]  CC: Back Pain and Anxiety   HPI Walter Howell has HTN, presents for   1. Anxiety: reports being in jail in 2015. Started having some anxiety then. Is worsening. He smokes to calm himself. He feels scared, nervous, panicky. He has shortness of breath and feeling like heart is beating fasting and irregular. This occurs multiple times a day. He does not drink alcohol. Has smoked marijuana and quit hoping it would help with his anxiety which it is did for a short time. No previous medications. He has intermittent depression that first started in 2015.   2. Back pain: x 3 months. Pain in across low mid and mid back. No previous injury known. Pain does not radiate. No injury in any other body part.   3. STI testing: screening requested. No discharge from penis or genital sores. He has sex with men. One recent partner. He does not use condoms consistently.   Social History  Substance Use Topics  . Smoking status: Former Games developer  . Smokeless tobacco: Never Used  . Alcohol use No    Outpatient Medications Prior to Visit  Medication Sig Dispense Refill  . albuterol (PROVENTIL HFA;VENTOLIN HFA) 108 (90 BASE) MCG/ACT inhaler Inhale 2 puffs into the lungs every 6 (six) hours as needed for wheezing or shortness of breath.     Marland Kitchen amLODipine (NORVASC) 10 MG tablet Take 1 tablet (10 mg total) by mouth daily. 30 tablet 5  . amoxicillin (AMOXIL) 250 MG capsule Take 1 capsule (250 mg total) by mouth 2 (two) times daily. 28 capsule 0  . cetirizine (ZYRTEC) 10 MG tablet Take 10 mg by mouth daily.    Marland Kitchen dextromethorphan-guaiFENesin (MUCINEX DM) 30-600 MG 12hr tablet Take 1 tablet by mouth 2 (two) times daily. (Patient not taking: Reported on 10/17/2015) 12 tablet 0  . fluticasone (FLONASE) 50 MCG/ACT nasal spray Place 2 sprays into both nostrils daily. (Patient not taking: Reported on 09/17/2014) 16 g 2    . hydrOXYzine (ATARAX/VISTARIL) 25 MG tablet Take 1 tablet (25 mg total) by mouth every 6 (six) hours. (Patient not taking: Reported on 10/17/2015) 12 tablet 0  . ibuprofen (ADVIL,MOTRIN) 800 MG tablet Take 800 mg by mouth every 8 (eight) hours as needed for moderate pain.    . Multiple Vitamin (MULTIVITAMIN WITH MINERALS) TABS tablet Take 1 tablet by mouth daily.    Marland Kitchen omeprazole (PRILOSEC) 20 MG capsule Take 1 capsule (20 mg total) by mouth daily before breakfast. (Patient not taking: Reported on 10/17/2015) 30 capsule 3  . ondansetron (ZOFRAN ODT) 4 MG disintegrating tablet Take 1 tablet (4 mg total) by mouth every 8 (eight) hours as needed for nausea. (Patient not taking: Reported on 10/17/2015) 10 tablet 0  . ondansetron (ZOFRAN) 4 MG tablet Take 1 tablet (4 mg total) by mouth every 6 (six) hours. (Patient not taking: Reported on 10/17/2015) 12 tablet 0  . ondansetron (ZOFRAN) 4 MG tablet Take 1 tablet (4 mg total) by mouth every 6 (six) hours. (Patient not taking: Reported on 10/17/2015) 12 tablet 0  . ranitidine (ZANTAC) 150 MG tablet Take 150 mg by mouth 2 (two) times daily as needed for heartburn.      No facility-administered medications prior to visit.     ROS Review of Systems  Constitutional: Negative for chills, fatigue, fever and unexpected weight change.  Eyes: Negative for visual disturbance.  Respiratory: Negative for cough and shortness of breath.   Cardiovascular: Negative for chest pain, palpitations and leg swelling.  Gastrointestinal: Negative for abdominal pain, blood in stool, constipation, diarrhea, nausea and vomiting.  Endocrine: Negative for polydipsia, polyphagia and polyuria.  Musculoskeletal: Positive for back pain. Negative for arthralgias, gait problem, myalgias and neck pain.  Skin: Negative for rash.  Allergic/Immunologic: Negative for immunocompromised state.  Hematological: Negative for adenopathy. Does not bruise/bleed easily.  Psychiatric/Behavioral:  Positive for dysphoric mood and sleep disturbance. Negative for suicidal ideas. The patient is nervous/anxious.     Objective:  BP (!) 152/92   Pulse 78   Temp 98.8 F (37.1 C)   Ht 5\' 11"  (1.803 m)   Wt 255 lb 3.2 oz (115.8 kg)   SpO2 97%   BMI 35.59 kg/m   BP/Weight 12/06/2015 10/17/2015 09/28/2015  Systolic BP 152 144 161  Diastolic BP 92 101 94  Wt. (Lbs) 255.2 - -  BMI 35.59 - -   Wt Readings from Last 3 Encounters:  12/06/15 255 lb 3.2 oz (115.8 kg)  09/05/15 255 lb 6.4 oz (115.8 kg)  02/24/15 268 lb 6.4 oz (121.7 kg)    Physical Exam  Constitutional: He appears well-developed and well-nourished. No distress.  HENT:  Head: Normocephalic and atraumatic.  Neck: Normal range of motion. Neck supple.  Cardiovascular: Normal rate, regular rhythm, normal heart sounds and intact distal pulses.   Pulmonary/Chest: Effort normal and breath sounds normal.  Musculoskeletal: He exhibits no edema.  Back Exam: Back: Normal Curvature, no deformities or CVA tenderness  Paraspinal Tenderness: b/l lumbar   LE Strength 5/5  LE Sensation: in tact  LE Reflexes 2+ and symmetric  Straight leg raise: negative    Neurological: He is alert.  Skin: Skin is warm and dry. No rash noted. No erythema.  Psychiatric: He has a normal mood and affect.   Lab Results  Component Value Date   HGBA1C 5.5 07/19/2014   [facility] 12/06/2015 02/24/2015 04/14/2014  Decreased Interest 3 1 0  Down, Depressed, Hopeless 3 0 0  PHQ - 2 Score 6 1 0   GAD 7 : Generalized Anxiety Score 02/24/2015  Nervous, Anxious, on Edge 3  Control/stop worrying 3  Worry too much - different things 3  Trouble relaxing 0  Restless 0  Easily annoyed or irritable 0  Afraid - awful might happen 0  Total GAD 7 Score 9     Assessment & Plan:  Walter Howell was seen today for back pain and anxiety.  Diagnoses and all orders for this visit:  Routine screening for STI (sexually transmitted infection) -     HIV  antibody (with reflex) -     RPR -     Urine cytology ancillary only  Anxiety and depression -     DULoxetine (CYMBALTA) 20 MG capsule; Take 1 capsule (20 mg total) by mouth daily.  Chronic bilateral low back pain without sciatica -     methocarbamol (ROBAXIN) 500 MG tablet; Take 1 tablet (500 mg total) by mouth every 8 (eight) hours as needed for muscle spasms. -     meloxicam (MOBIC) 7.5 MG tablet; Take 1 tablet (7.5 mg total) by mouth daily.  Essential hypertension, benign -     amLODipine (NORVASC) 10 MG tablet; Take 1 tablet (10 mg total) by mouth daily.   There are no diagnoses linked to this encounter.  No orders of the defined types were placed in this encounter.  Follow-up: Return in about 4 weeks (around 01/03/2016) for depression and back pain .   Dessa Phi MD

## 2015-12-07 ENCOUNTER — Telehealth: Payer: Self-pay | Admitting: *Deleted

## 2015-12-07 LAB — HIV ANTIBODY (ROUTINE TESTING W REFLEX): HIV: NONREACTIVE

## 2015-12-07 LAB — RPR

## 2015-12-07 MED FILL — METHOCARBAMOL 500 MG TABLET: 500 | 30 days supply | Qty: 60 | Fill #0

## 2015-12-07 MED FILL — DULoxetine HCL 20 MG CPEP: 20 | 30 days supply | Qty: 30 | Fill #0

## 2015-12-07 MED FILL — MELOXICAM 7.5 MG TABLET: 7.5 | 30 days supply | Qty: 30 | Fill #0

## 2015-12-07 NOTE — Assessment & Plan Note (Signed)
A: anxiety and depressed mood with chronic back pain P: cymbalta Advised counseling Advised yoga Plan to add buspar if patient has improvement with and tolerates cymbalta

## 2015-12-07 NOTE — Telephone Encounter (Signed)
MA informed patient of HIV and Syphilis being negative on patients authorized number. Patient was provided [facility] number for any questions or concerns.

## 2015-12-07 NOTE — Assessment & Plan Note (Signed)
A: MSK pain without evidence of sciatica P: mobic daily prn Robaxin as needed

## 2015-12-07 NOTE — Telephone Encounter (Signed)
-----   Message from Dessa PhiJosalyn Funches, MD sent at 12/07/2015  3:02 PM EDT ----- Screening HIV and RPR (syphilis) negative

## 2015-12-08 LAB — URINE CYTOLOGY ANCILLARY ONLY
CHLAMYDIA, DNA PROBE: NEGATIVE
NEISSERIA GONORRHEA: NEGATIVE
Trichomonas: NEGATIVE

## 2015-12-09 ENCOUNTER — Telehealth: Payer: Self-pay

## 2015-12-09 NOTE — Telephone Encounter (Signed)
Pt was called on 10/13 and informed of results. 

## 2015-12-11 ENCOUNTER — Encounter (HOSPITAL_COMMUNITY): Payer: Self-pay | Admitting: Emergency Medicine

## 2015-12-11 ENCOUNTER — Emergency Department (HOSPITAL_COMMUNITY)
Admission: EM | Admit: 2015-12-11 | Discharge: 2015-12-11 | Disposition: A | Discharge status: Home / Self Care | Payer: Self-pay | Attending: Emergency Medicine | Admitting: Emergency Medicine

## 2015-12-11 DIAGNOSIS — R112 Nausea with vomiting, unspecified: Secondary | ICD-10-CM

## 2015-12-11 DIAGNOSIS — J45909 Unspecified asthma, uncomplicated: Secondary | ICD-10-CM | POA: Insufficient documentation

## 2015-12-11 DIAGNOSIS — I10 Essential (primary) hypertension: Secondary | ICD-10-CM | POA: Insufficient documentation

## 2015-12-11 DIAGNOSIS — R197 Diarrhea, unspecified: Secondary | ICD-10-CM | POA: Insufficient documentation

## 2015-12-11 DIAGNOSIS — Z79899 Other long term (current) drug therapy: Secondary | ICD-10-CM | POA: Insufficient documentation

## 2015-12-11 DIAGNOSIS — Z87891 Personal history of nicotine dependence: Secondary | ICD-10-CM | POA: Insufficient documentation

## 2015-12-11 LAB — CBC
HEMATOCRIT: 45 % (ref 39.0–52.0)
Hemoglobin: 15.9 g/dL (ref 13.0–17.0)
MCH: 31.1 pg (ref 26.0–34.0)
MCHC: 35.3 g/dL (ref 30.0–36.0)
MCV: 87.9 fL (ref 78.0–100.0)
Platelets: 208 10*3/uL (ref 150–400)
RBC: 5.12 MIL/uL (ref 4.22–5.81)
RDW: 13.7 % (ref 11.5–15.5)
WBC: 5.8 10*3/uL (ref 4.0–10.5)

## 2015-12-11 LAB — COMPREHENSIVE METABOLIC PANEL
ALBUMIN: 4 g/dL (ref 3.5–5.0)
ALT: 34 U/L (ref 17–63)
AST: 23 U/L (ref 15–41)
Alkaline Phosphatase: 43 U/L (ref 38–126)
Anion gap: 7 (ref 5–15)
BUN: 10 mg/dL (ref 6–20)
CHLORIDE: 108 mmol/L (ref 101–111)
CO2: 25 mmol/L (ref 22–32)
Calcium: 8.8 mg/dL — ABNORMAL LOW (ref 8.9–10.3)
Creatinine, Ser: 0.79 mg/dL (ref 0.61–1.24)
GFR calc Af Amer: >60 mL/min (ref >60)
GFR calc non Af Amer: >60 mL/min (ref >60)
GLUCOSE: 112 mg/dL — AB (ref 65–99)
POTASSIUM: 3.6 mmol/L (ref 3.5–5.1)
Sodium: 140 mmol/L (ref 135–145)
Total Bilirubin: 0.8 mg/dL (ref 0.3–1.2)
Total Protein: 7 g/dL (ref 6.5–8.1)

## 2015-12-11 LAB — URINALYSIS, ROUTINE W REFLEX MICROSCOPIC
BILIRUBIN URINE: NEGATIVE
GLUCOSE, UA: NEGATIVE mg/dL
Hgb urine dipstick: NEGATIVE
KETONES UR: NEGATIVE mg/dL
Leukocytes, UA: NEGATIVE
Nitrite: NEGATIVE
PH: 6 (ref 5.0–8.0)
Protein, ur: NEGATIVE mg/dL
Specific Gravity, Urine: 1.018 (ref 1.005–1.030)

## 2015-12-11 LAB — LIPASE, BLOOD: LIPASE: 30 U/L (ref 11–51)

## 2015-12-11 MED ORDER — ONDANSETRON 4 MG PO TBDP: 4.0000 mg | ORAL_TABLET | Freq: Three times a day (TID) | ORAL | 0 refills | Status: DC | PRN

## 2015-12-11 MED ORDER — ONDANSETRON 8 MG PO TBDP
8.0000 mg | ORAL_TABLET | Freq: Once | ORAL | Status: AC
  Administered 2015-12-11: 8 mg via ORAL
  Filled 2015-12-11: qty 1

## 2015-12-11 NOTE — ED Notes (Signed)
Patient given cup of water and encouraged to drink.

## 2015-12-11 NOTE — Discharge Instructions (Signed)
Drink plenty of fluids. Take zofran as needed for nausea. Follow up with PCP.

## 2015-12-11 NOTE — ED Provider Notes (Signed)
[facility]-EMERGENCY DEPT Provider Note   CSN: [id number] Arrival date & time: 12/11/15  1149     History   Chief Complaint Chief Complaint  Patient presents with  . Abdominal Pain  . Emesis    HPI Walter Howell is a 30 y.o. male with a past medical history of HTN, anxiety who presents to the ED today complaining of nausea and vomiting. Patient states that 5 days ago he began experiencing nausea. 3 days ago he reports 3-4 episodes of nonbloody, nonbilious emesis. Patient also reports to 3 episodes of diarrhea. No melena or hematochezia. Last episode of emesis was 2 days ago. He denies any fevers or chills. Patient is concerned he may have caught a "stomach bug". He is requesting a work note dated back to 2 days ago.  HPI  Past Medical History:  Diagnosis Date  . Anxiety   . Asthma   . GERD (gastroesophageal reflux disease)   . Hypertension     Patient Active Problem List   Diagnosis Date Noted  . Anxiety and depression 12/06/2015  . Chronic bilateral low back pain without sciatica 12/06/2015  . Essential hypertension, benign 07/08/2013  . Dental cavities 07/08/2013  . Pain, dental 07/08/2013    History reviewed. No pertinent surgical history.     Home Medications    Prior to Admission medications   Medication Sig Start Date End Date Taking? Authorizing Provider  albuterol (PROVENTIL HFA;VENTOLIN HFA) 108 (90 BASE) MCG/ACT inhaler Inhale 2 puffs into the lungs every 6 (six) hours as needed for wheezing or shortness of breath.     Historical Provider, MD  amLODipine (NORVASC) 10 MG tablet Take 1 tablet (10 mg total) by mouth daily. 12/06/15   Josalyn Funches, MD  cetirizine (ZYRTEC) 10 MG tablet Take 10 mg by mouth daily.    Historical Provider, MD  DULoxetine (CYMBALTA) 20 MG capsule Take 1 capsule (20 mg total) by mouth daily. 12/06/15   Josalyn Funches, MD  fluticasone (FLONASE) 50 MCG/ACT nasal spray Place 2 sprays into both nostrils daily. Patient not taking:  Reported on 09/17/2014 07/19/14   Doris Cheadle, MD  ibuprofen (ADVIL,MOTRIN) 800 MG tablet Take 800 mg by mouth every 8 (eight) hours as needed for moderate pain.    Historical Provider, MD  meloxicam (MOBIC) 7.5 MG tablet Take 1 tablet (7.5 mg total) by mouth daily. 12/06/15   Josalyn Funches, MD  methocarbamol (ROBAXIN) 500 MG tablet Take 1 tablet (500 mg total) by mouth every 8 (eight) hours as needed for muscle spasms. 12/06/15   Dessa Phi, MD  Multiple Vitamin (MULTIVITAMIN WITH MINERALS) TABS tablet Take 1 tablet by mouth daily.    Historical Provider, MD  ranitidine (ZANTAC) 150 MG tablet Take 150 mg by mouth 2 (two) times daily as needed for heartburn.     Historical Provider, MD    Family History Family History  Problem Relation Age of Onset  . Diabetes Maternal Grandmother   . Hypertension Maternal Grandmother   . Diabetes Maternal Grandfather   . Diabetes Paternal Grandmother   . Heart disease Paternal Grandmother   . Diabetes Paternal Grandfather     Social History Social History  Substance Use Topics  . Smoking status: Former Games developer  . Smokeless tobacco: Never Used  . Alcohol use No     Allergies   Prednisone   Review of Systems Review of Systems  All other systems reviewed and are negative.    Physical Exam Updated Vital Signs BP 153/76 (BP Location:  Left Arm)   Pulse 65   Temp 98.1 F (36.7 C) (Oral)   Resp 16   Ht 5\' 11"  (1.803 m)   Wt 113.4 kg   SpO2 95%   BMI 34.87 kg/m   Physical Exam  Constitutional: He is oriented to person, place, and time. He appears well-developed and well-nourished. No distress.  HENT:  Head: Normocephalic and atraumatic.  Mouth/Throat: No oropharyngeal exudate.  Eyes: Conjunctivae and EOM are normal. Pupils are equal, round, and reactive to light. Right eye exhibits no discharge. Left eye exhibits no discharge. No scleral icterus.  Cardiovascular: Normal rate, regular rhythm, normal heart sounds and intact distal  pulses.  Exam reveals no gallop and no friction rub.   No murmur heard. Pulmonary/Chest: Effort normal and breath sounds normal. No respiratory distress. He has no wheezes. He has no rales. He exhibits no tenderness.  Abdominal: Soft. Bowel sounds are normal. He exhibits no distension and no mass. There is no tenderness. There is no rebound and no guarding.  Musculoskeletal: Normal range of motion. He exhibits no edema.  Neurological: He is alert and oriented to person, place, and time.  Skin: Skin is warm and dry. No rash noted. He is not diaphoretic. No erythema. No pallor.  Psychiatric: He has a normal mood and affect. His behavior is normal.  Nursing note and vitals reviewed.    ED Treatments / Results  Labs (all labs ordered are listed, but only abnormal results are displayed) Labs Reviewed  COMPREHENSIVE METABOLIC PANEL - Abnormal; Notable for the following:       Result Value   Glucose, Bld 112 (*)    Calcium 8.8 (*)    All other components within normal limits  LIPASE, BLOOD  CBC  URINALYSIS, ROUTINE W REFLEX MICROSCOPIC (NOT AT [facility])    EKG  EKG Interpretation None       Radiology No results found.  Procedures Procedures (including critical care time)  Medications Ordered in ED Medications - No data to display   Initial Impression / Assessment and Plan / ED Course  I have reviewed the triage vital signs and the nursing notes.  Pertinent labs & imaging results that were available during my care of the patient were reviewed by me and considered in my medical decision making (see chart for details).  Clinical Course    30 y.o M presents to the ED today c/o N/V onset 5 days ago. However, pt states that last emesis was 2 days ago. Pt appears well in ED, in NAD. All VSS. Abd is soft and non-tender. Lab work unremarkable. Pt is sitting up in bed and drinking. Requesting work note. Zofran ODT given. Pt able to tolerate PO without difficulty. No sign of dehydration.  Follow up with PCP. Return precautions outlined in patient discharge instructions.    Final Clinical Impressions(s) / ED Diagnoses   Final diagnoses:  Nausea vomiting and diarrhea    New Prescriptions New Prescriptions   No medications on file     Dub MikesSamantha Tripp Dowless, PA-C 12/11/15 1750    Tilden FossaElizabeth Rees, MD 12/12/15 0800

## 2015-12-11 NOTE — ED Triage Notes (Signed)
Pt reports generalized abdominal pain with N/V/D x3 days. Pt reports 4 episodes of emesis and 3 episodes of diarrhea in the past 24 hours.

## 2016-01-12 MED FILL — AMLODIPINE BESYLATE 10 MG T: 10 | 30 days supply | Qty: 30 | Fill #0

## 2016-01-17 ENCOUNTER — Encounter (HOSPITAL_COMMUNITY): Payer: Self-pay | Admitting: Emergency Medicine

## 2016-01-17 ENCOUNTER — Emergency Department (HOSPITAL_COMMUNITY)
Admission: EM | Admit: 2016-01-17 | Discharge: 2016-01-17 | Disposition: A | Discharge status: Home / Self Care | Payer: Self-pay | Attending: Emergency Medicine | Admitting: Emergency Medicine

## 2016-01-17 ENCOUNTER — Emergency Department (HOSPITAL_COMMUNITY): Payer: Self-pay

## 2016-01-17 DIAGNOSIS — Z87891 Personal history of nicotine dependence: Secondary | ICD-10-CM | POA: Insufficient documentation

## 2016-01-17 DIAGNOSIS — I10 Essential (primary) hypertension: Secondary | ICD-10-CM | POA: Insufficient documentation

## 2016-01-17 DIAGNOSIS — Z79899 Other long term (current) drug therapy: Secondary | ICD-10-CM | POA: Insufficient documentation

## 2016-01-17 DIAGNOSIS — J4520 Mild intermittent asthma, uncomplicated: Secondary | ICD-10-CM | POA: Insufficient documentation

## 2016-01-17 MED ORDER — ALBUTEROL SULFATE (2.5 MG/3ML) 0.083% IN NEBU
5.0000 mg | INHALATION_SOLUTION | Freq: Once | RESPIRATORY_TRACT | Status: AC
  Administered 2016-01-17: 5 mg via RESPIRATORY_TRACT
  Filled 2016-01-17: qty 6

## 2016-01-17 MED ORDER — ALBUTEROL SULFATE HFA 108 (90 BASE) MCG/ACT IN AERS
2.0000 | INHALATION_SPRAY | Freq: Four times a day (QID) | RESPIRATORY_TRACT | Status: DC | PRN
  Administered 2016-01-17: 2 via RESPIRATORY_TRACT
  Filled 2016-01-17: qty 6.7

## 2016-01-17 NOTE — ED Provider Notes (Signed)
[facility]-EMER[facility]ENCY DEPT Provider Note   CSN: [id number] Arrival date & time: 01/17/16  2108     History   Chief Complaint Chief Complaint  Patient presents with  . Asthma    HPI Walter Howell is a 30 y.o. male.  Patient with history of asthma, but has not had an attack in "years".  Patient reports two days of worsening cough with wheezing. He denies fever, chills, rhinorrhea, sore throat, ear pain, nausea, or abdominal pain.   The history is provided by the patient. No language interpreter was used.  Asthma  This is a new problem. The current episode started 2 days ago. The problem occurs daily. The problem has been gradually worsening.    Past Medical History:  Diagnosis Date  . Anxiety   . Asthma   . GERD (gastroesophageal reflux disease)   . Hypertension     Patient Active Problem List   Diagnosis Date Noted  . Anxiety and depression 12/06/2015  . Chronic bilateral low back pain without sciatica 12/06/2015  . Essential hypertension, benign 07/08/2013  . Dental cavities 07/08/2013  . Pain, dental 07/08/2013    History reviewed. No pertinent surgical history.     Home Medications    Prior to Admission medications   Medication Sig Start Date End Date Taking? Authorizing Provider  albuterol (PROVENTIL HFA;VENTOLIN HFA) 108 (90 BASE) MCG/ACT inhaler Inhale 2 puffs into the lungs every 6 (six) hours as needed for wheezing or shortness of breath.     Historical Provider, MD  amLODipine (NORVASC) 10 MG tablet Take 1 tablet (10 mg total) by mouth daily. 12/06/15   Josalyn Funches, MD  cetirizine (ZYRTEC) 10 MG tablet Take 10 mg by mouth daily.    Historical Provider, MD  DULoxetine (CYMBALTA) 20 MG capsule Take 1 capsule (20 mg total) by mouth daily. 12/06/15   Josalyn Funches, MD  fluticasone (FLONASE) 50 MCG/ACT nasal spray Place 2 sprays into both nostrils daily. Patient taking differently: Place 2 sprays into both nostrils daily as needed for allergies.   07/19/14   Doris Cheadleeepak Advani, MD  meloxicam (MOBIC) 7.5 MG tablet Take 1 tablet (7.5 mg total) by mouth daily. 12/06/15   Josalyn Funches, MD  methocarbamol (ROBAXIN) 500 MG tablet Take 1 tablet (500 mg total) by mouth every 8 (eight) hours as needed for muscle spasms. 12/06/15   Dessa PhiJosalyn Funches, MD  Multiple Vitamin (MULTIVITAMIN WITH MINERALS) TABS tablet Take 1 tablet by mouth daily.    Historical Provider, MD  ondansetron (ZOFRAN ODT) 4 MG disintegrating tablet Take 1 tablet (4 mg total) by mouth every 8 (eight) hours as needed for nausea or vomiting. 12/11/15   Samantha Tripp Dowless, PA-C  ranitidine (ZANTAC) 150 MG tablet Take 150 mg by mouth 2 (two) times daily as needed for heartburn.     Historical Provider, MD    Family History Family History  Problem Relation Age of Onset  . Diabetes Maternal Grandmother   . Hypertension Maternal Grandmother   . Diabetes Maternal Grandfather   . Diabetes Paternal Grandmother   . Heart disease Paternal Grandmother   . Diabetes Paternal Grandfather     Social History Social History  Substance Use Topics  . Smoking status: Former Games developermoker  . Smokeless tobacco: Never Used  . Alcohol use No     Allergies   Prednisone   Review of Systems Review of Systems  Respiratory: Positive for chest tightness and wheezing.   All other systems reviewed and are negative.  Physical Exam Updated Vital Signs BP 154/84 (BP Location: Right Arm)   Pulse 86   Temp 98.8 F (37.1 C) (Oral)   Resp 18   Ht 5\' 11"  (1.803 m)   Wt 102.1 kg   SpO2 100%   BMI 31.38 kg/m   Physical Exam  Constitutional: He is oriented to person, place, and time. He appears well-developed and well-nourished.  HENT:  Head: Atraumatic.  Eyes: Conjunctivae are normal.  Neck: Neck supple.  Cardiovascular: Normal rate and regular rhythm.   Pulmonary/Chest: Effort normal. He exhibits tenderness.  Abdominal: Soft. Bowel sounds are normal.  Musculoskeletal: Normal range of  motion.  Lymphadenopathy:    He has no cervical adenopathy.  Neurological: He is alert and oriented to person, place, and time.  Skin: Skin is warm and dry.  Psychiatric: He has a normal mood and affect.  Nursing note and vitals reviewed.    ED Treatments / Results  Labs (all labs ordered are listed, but only abnormal results are displayed) Labs Reviewed - No data to display  EKG  EKG Interpretation None       Radiology Dg Chest 2 View  Result Date: 01/17/2016 CLINICAL DATA:  [age] y/o M; cough, shortness of breath, and chest tightness with history of asthma. EXAM: CHEST  2 VIEW COMPARISON:  11/25/2014 chest radiograph FINDINGS: The heart size and mediastinal contours are within normal limits and stable. Both lungs are clear. The visualized skeletal structures are unremarkable. IMPRESSION: No active cardiopulmonary disease. Electronically Signed   By: Mitzi HansenLance  Furusawa-Stratton M.D.   On: 01/17/2016 22:02    Procedures Procedures (including critical care time)  Medications Ordered in ED Medications  albuterol (PROVENTIL) (2.5 MG/3ML) 0.083% nebulizer solution 5 mg (not administered)     Initial Impression / Assessment and Plan / ED Course  I have reviewed the triage vital signs and the nursing notes.  Pertinent labs & imaging results that were available during my care of the patient were reviewed by me and considered in my medical decision making (see chart for details).  Clinical Course   Patient with mild signs and symptoms of asthma/RAD. Oxygen saturation is above 90%. No accessory muscle use, no cyanosis. Treated in the ED.  Patient feels improved after treatment. Will discharge with albuterol MDI. Pt instructed to follow up with PCP. Patient is hemodynamically stable. Discussed return precautions. Pt appears safe for discharge.      Final Clinical Impressions(s) / ED Diagnoses   Final diagnoses:  Mild intermittent asthma, unspecified whether complicated    New  Prescriptions New Prescriptions   No medications on file     Felicie MornDavid Harmoney Sienkiewicz, NP 01/17/16 2214    Doug SouSam Jacubowitz, MD 01/18/16 0111

## 2016-01-17 NOTE — ED Triage Notes (Signed)
Patient complaining of having some difficulty breathing due to his asthma. Patient states it started two days ago. Patient states his chest feels tight due to asthma. Patient states he has not used any inhalers to help relieve symptoms.

## 2016-01-28 ENCOUNTER — Encounter (HOSPITAL_COMMUNITY): Payer: Self-pay | Admitting: Nurse Practitioner

## 2016-01-28 ENCOUNTER — Emergency Department (HOSPITAL_COMMUNITY)
Admission: EM | Admit: 2016-01-28 | Discharge: 2016-01-28 | Disposition: A | Discharge status: Home / Self Care | Payer: Self-pay | Attending: Emergency Medicine | Admitting: Emergency Medicine

## 2016-01-28 DIAGNOSIS — J45909 Unspecified asthma, uncomplicated: Secondary | ICD-10-CM | POA: Insufficient documentation

## 2016-01-28 DIAGNOSIS — Z79899 Other long term (current) drug therapy: Secondary | ICD-10-CM | POA: Insufficient documentation

## 2016-01-28 DIAGNOSIS — I10 Essential (primary) hypertension: Secondary | ICD-10-CM | POA: Insufficient documentation

## 2016-01-28 DIAGNOSIS — H9311 Tinnitus, right ear: Secondary | ICD-10-CM | POA: Insufficient documentation

## 2016-01-28 DIAGNOSIS — Z87891 Personal history of nicotine dependence: Secondary | ICD-10-CM | POA: Insufficient documentation

## 2016-01-28 DIAGNOSIS — J069 Acute upper respiratory infection, unspecified: Secondary | ICD-10-CM | POA: Insufficient documentation

## 2016-01-28 MED ORDER — FLUTICASONE PROPIONATE 50 MCG/ACT NA SUSP
2.0000 | Freq: Every day | NASAL | Status: DC
  Administered 2016-01-28: 2 via NASAL
  Filled 2016-01-28: qty 16

## 2016-01-28 MED ORDER — AMOXICILLIN 500 MG PO CAPS: 1000.0000 mg | ORAL_CAPSULE | Freq: Two times a day (BID) | ORAL | 0 refills | Status: DC

## 2016-01-28 NOTE — ED Triage Notes (Signed)
Pt c/o right ear pian, onset 2 days ago with associated cold symptoms. Describes the pain as constant buzzing.

## 2016-01-28 NOTE — Discharge Instructions (Signed)
Please follow with your primary care doctor in the next 2 days for a check-up. They must obtain records for further management.  ° °Do not hesitate to return to the Emergency Department for any new, worsening or concerning symptoms.  ° °

## 2016-01-28 NOTE — ED Notes (Signed)
PT DISCHARGED. INSTRUCTIONS AND PRESCRIPTIONS GIVEN. AAOX4. PT IN NO APPARENT DISTRESS OR PAIN. THE OPPORTUNITY TO ASK QUESTIONS WAS PROVIDED. 

## 2016-01-28 NOTE — ED Provider Notes (Signed)
[facility]-EMERGENCY DEPT Provider Note   CSN: [id number] Arrival date & time: 01/28/16  1931     History   Chief Complaint Chief Complaint  Patient presents with  . Otalgia     HPI   Blood pressure (!) 148/102, pulse 69, temperature 98.8 F (37.1 C), temperature source Oral, resp. rate 14, SpO2 98 %.  Walter Howell is a 30 y.o. male complaining of upper respiratory symptoms of rhinorrhea, sinus congestion and dry cough onset 1 week ago he has pressure and tinnitus in the right ear onset today with associated reduced hearing. He denies fevers, chills, difficulty ambulating.  Past Medical History:  Diagnosis Date  . Anxiety   . Asthma   . GERD (gastroesophageal reflux disease)   . Hypertension     Patient Active Problem List   Diagnosis Date Noted  . Anxiety and depression 12/06/2015  . Chronic bilateral low back pain without sciatica 12/06/2015  . Essential hypertension, benign 07/08/2013  . Dental cavities 07/08/2013  . Pain, dental 07/08/2013    History reviewed. No pertinent surgical history.     Home Medications    Prior to Admission medications   Medication Sig Start Date End Date Taking? Authorizing Provider  albuterol (PROVENTIL HFA;VENTOLIN HFA) 108 (90 BASE) MCG/ACT inhaler Inhale 2 puffs into the lungs every 6 (six) hours as needed for wheezing or shortness of breath.     Historical Provider, MD  amLODipine (NORVASC) 10 MG tablet Take 1 tablet (10 mg total) by mouth daily. 12/06/15   Josalyn Funches, MD  amoxicillin (AMOXIL) 500 MG capsule Take 2 capsules (1,000 mg total) by mouth 2 (two) times daily. 01/28/16   Renly Guedes, PA-C  cetirizine (ZYRTEC) 10 MG tablet Take 10 mg by mouth daily.    Historical Provider, MD  DULoxetine (CYMBALTA) 20 MG capsule Take 1 capsule (20 mg total) by mouth daily. 12/06/15   Josalyn Funches, MD  fluticasone (FLONASE) 50 MCG/ACT nasal spray Place 2 sprays into both nostrils daily. Patient taking differently: Place 2  sprays into both nostrils daily as needed for allergies.  07/19/14   Doris Cheadleeepak Advani, MD  meloxicam (MOBIC) 7.5 MG tablet Take 1 tablet (7.5 mg total) by mouth daily. 12/06/15   Josalyn Funches, MD  methocarbamol (ROBAXIN) 500 MG tablet Take 1 tablet (500 mg total) by mouth every 8 (eight) hours as needed for muscle spasms. 12/06/15   Dessa PhiJosalyn Funches, MD  Multiple Vitamin (MULTIVITAMIN WITH MINERALS) TABS tablet Take 1 tablet by mouth daily.    Historical Provider, MD  ondansetron (ZOFRAN ODT) 4 MG disintegrating tablet Take 1 tablet (4 mg total) by mouth every 8 (eight) hours as needed for nausea or vomiting. 12/11/15   Samantha Tripp Dowless, PA-C  ranitidine (ZANTAC) 150 MG tablet Take 150 mg by mouth 2 (two) times daily as needed for heartburn.     Historical Provider, MD    Family History Family History  Problem Relation Age of Onset  . Diabetes Maternal Grandmother   . Hypertension Maternal Grandmother   . Diabetes Maternal Grandfather   . Diabetes Paternal Grandmother   . Heart disease Paternal Grandmother   . Diabetes Paternal Grandfather     Social History Social History  Substance Use Topics  . Smoking status: Former Games developermoker  . Smokeless tobacco: Never Used  . Alcohol use No     Allergies   Prednisone   Review of Systems Review of Systems  10 systems reviewed and found to be negative, except as noted in  the HPI.   Physical Exam Updated Vital Signs BP 142/92 (BP Location: Right Arm)   Pulse 81   Temp 98.8 F (37.1 C) (Oral)   Resp 16   Ht 5\' 11"  (1.803 m)   Wt 107 kg   SpO2 98%   BMI 32.92 kg/m   Physical Exam  Constitutional: He appears well-developed and well-nourished.  HENT:  Head: Normocephalic.  Right Ear: External ear normal.  Left Ear: External ear normal.  Mouth/Throat: Oropharynx is clear and moist. No oropharyngeal exudate.  No drooling or stridor. Posterior pharynx mildly erythematous no significant tonsillar hypertrophy. No exudate. Soft  palate rises symmetrically. No TTP or induration under tongue.   No tenderness to palpation of frontal or bilateral maxillary sinuses.  Mild mucosal edema in the nares with scant rhinorrhea.  Bilateral tympanic membranes with normal architecture and good light reflex.    Eyes: Conjunctivae and EOM are normal. Pupils are equal, round, and reactive to light.  Neck: Normal range of motion. Neck supple.  Cardiovascular: Normal rate and regular rhythm.   Pulmonary/Chest: Effort normal and breath sounds normal. No stridor. No respiratory distress. He has no wheezes. He has no rales. He exhibits no tenderness.  Abdominal: Soft. There is no tenderness. There is no rebound and no guarding.  Nursing note and vitals reviewed.    ED Treatments / Results  Labs (all labs ordered are listed, but only abnormal results are displayed) Labs Reviewed - No data to display  EKG  EKG Interpretation None       Radiology No results found.  Procedures Procedures (including critical care time)  Medications Ordered in ED Medications  fluticasone (FLONASE) 50 MCG/ACT nasal spray 2 spray (2 sprays Each Nare Given 01/28/16 2053)     Initial Impression / Assessment and Plan / ED Course  I have reviewed the triage vital signs and the nursing notes.  Pertinent labs & imaging results that were available during my care of the patient were reviewed by me and considered in my medical decision making (see chart for details).  Clinical Course     Vitals:   01/28/16 1948 01/28/16 2054 01/28/16 2056  BP: (!) 148/102  142/92  Pulse: 69  81  Resp: 14  16  Temp: 98.8 F (37.1 C)    TempSrc: Oral    SpO2: 98%  98%  Weight:  107 kg   Height:  5\' 11"  (1.803 m)     Medications  fluticasone (FLONASE) 50 MCG/ACT nasal spray 2 spray (2 sprays Each Nare Given 01/28/16 2053)    Walter Howell is 30 y.o. male presenting with URI like symptoms with associated tinnitus and reduced hearing out of the  right ear. No ataxia or difficulty ambulating. Normal architecture and light reflex of the right tympanic membrane. Will try treating for URI and eustachian tube dysfunction and ENT referral will be given. Patient verbalizes understanding of importance of follow-up with ENT if symptoms do not resolve in about 10 days.   Evaluation does not show pathology that would require ongoing emergent intervention or inpatient treatment. Pt is hemodynamically stable and mentating appropriately. Discussed findings and plan with patient/guardian, who agrees with care plan. All questions answered. Return precautions discussed and outpatient follow up given.      Final Clinical Impressions(s) / ED Diagnoses   Final diagnoses:  URI, acute  Tinnitus, right    New Prescriptions Discharge Medication List as of 01/28/2016  8:37 PM    START taking these medications  Details  amoxicillin (AMOXIL) 500 MG capsule Take 2 capsules (1,000 mg total) by mouth 2 (two) times daily., Starting Sat 01/28/2016, [employer], PA-C 01/28/16 2105    Gerhard Munch, MD 01/29/16 [phone]

## 2016-03-04 ENCOUNTER — Emergency Department (HOSPITAL_COMMUNITY): Payer: Self-pay

## 2016-03-04 ENCOUNTER — Emergency Department (HOSPITAL_COMMUNITY)
Admission: EM | Admit: 2016-03-04 | Discharge: 2016-03-04 | Disposition: A | Discharge status: Home / Self Care | Payer: Self-pay | Attending: Emergency Medicine | Admitting: Emergency Medicine

## 2016-03-04 ENCOUNTER — Encounter (HOSPITAL_COMMUNITY): Payer: Self-pay

## 2016-03-04 DIAGNOSIS — Z79899 Other long term (current) drug therapy: Secondary | ICD-10-CM | POA: Insufficient documentation

## 2016-03-04 DIAGNOSIS — Z87891 Personal history of nicotine dependence: Secondary | ICD-10-CM | POA: Insufficient documentation

## 2016-03-04 DIAGNOSIS — J45909 Unspecified asthma, uncomplicated: Secondary | ICD-10-CM | POA: Insufficient documentation

## 2016-03-04 DIAGNOSIS — J069 Acute upper respiratory infection, unspecified: Secondary | ICD-10-CM | POA: Insufficient documentation

## 2016-03-04 DIAGNOSIS — I10 Essential (primary) hypertension: Secondary | ICD-10-CM | POA: Insufficient documentation

## 2016-03-04 MED ORDER — BENZONATATE 100 MG PO CAPS: 100.0000 mg | ORAL_CAPSULE | Freq: Three times a day (TID) | ORAL | 0 refills | Status: DC

## 2016-03-04 NOTE — ED Provider Notes (Signed)
[facility]-EMERGENCY DEPT Provider Note  CSN: [id number] Arrival date & time: 03/04/16  2206  By signing my name below, I, Javier Docker, attest that this documentation has been prepared under the direction and in the presence of Audry Pili, PA-C. Electronically Signed: Javier Docker, ER Scribe. 10/08/2015. 11:09 PM.  History   Chief Complaint Chief Complaint  Patient presents with  . Cough   The history is provided by the patient. No language interpreter was used.    HPI Comments: Walter Howell is a 31 y.o. male who presents to the Emergency Department complaining of 3-4 days of chest congestion, productive cough, and rhinorrhea with associated ear popping, chills and sinus pressure. He denies N/V/D, abdominal pain or fever. He has a PMHx of asthma and HTN. He has taken nyquil and dayquil without significant relief.   Past Medical History:  Diagnosis Date  . Anxiety   . Asthma   . GERD (gastroesophageal reflux disease)   . Hypertension     Patient Active Problem List   Diagnosis Date Noted  . Anxiety and depression 12/06/2015  . Chronic bilateral low back pain without sciatica 12/06/2015  . Essential hypertension, benign 07/08/2013  . Dental cavities 07/08/2013  . Pain, dental 07/08/2013    History reviewed. No pertinent surgical history.     Home Medications    Prior to Admission medications   Medication Sig Start Date End Date Taking? Authorizing Provider  albuterol (PROVENTIL HFA;VENTOLIN HFA) 108 (90 BASE) MCG/ACT inhaler Inhale 2 puffs into the lungs every 6 (six) hours as needed for wheezing or shortness of breath.     Historical Provider, MD  amLODipine (NORVASC) 10 MG tablet Take 1 tablet (10 mg total) by mouth daily. 12/06/15   Josalyn Funches, MD  amoxicillin (AMOXIL) 500 MG capsule Take 2 capsules (1,000 mg total) by mouth 2 (two) times daily. 01/28/16   Nicole Pisciotta, PA-C  cetirizine (ZYRTEC) 10 MG tablet Take 10 mg by mouth daily.    Historical  Provider, MD  DULoxetine (CYMBALTA) 20 MG capsule Take 1 capsule (20 mg total) by mouth daily. 12/06/15   Josalyn Funches, MD  fluticasone (FLONASE) 50 MCG/ACT nasal spray Place 2 sprays into both nostrils daily. Patient taking differently: Place 2 sprays into both nostrils daily as needed for allergies.  07/19/14   Doris Cheadle, MD  meloxicam (MOBIC) 7.5 MG tablet Take 1 tablet (7.5 mg total) by mouth daily. 12/06/15   Josalyn Funches, MD  methocarbamol (ROBAXIN) 500 MG tablet Take 1 tablet (500 mg total) by mouth every 8 (eight) hours as needed for muscle spasms. 12/06/15   Dessa Phi, MD  Multiple Vitamin (MULTIVITAMIN WITH MINERALS) TABS tablet Take 1 tablet by mouth daily.    Historical Provider, MD  ondansetron (ZOFRAN ODT) 4 MG disintegrating tablet Take 1 tablet (4 mg total) by mouth every 8 (eight) hours as needed for nausea or vomiting. 12/11/15   Samantha Tripp Dowless, PA-C  ranitidine (ZANTAC) 150 MG tablet Take 150 mg by mouth 2 (two) times daily as needed for heartburn.     Historical Provider, MD    Family History Family History  Problem Relation Age of Onset  . Diabetes Maternal Grandmother   . Hypertension Maternal Grandmother   . Diabetes Maternal Grandfather   . Diabetes Paternal Grandmother   . Heart disease Paternal Grandmother   . Diabetes Paternal Grandfather     Social History Social History  Substance Use Topics  . Smoking status: Former Games developer  .  Smokeless tobacco: Never Used  . Alcohol use No     Allergies   Prednisone   Review of Systems Review of Systems  Constitutional: Positive for chills.  HENT: Positive for congestion.   Respiratory: Positive for cough.    A complete 10 system review of systems was obtained and all systems are negative except as noted in the HPI and PMH.   Physical Exam Updated Vital Signs BP 148/84   Pulse 95   Temp 98.7 F (37.1 C) (Oral)   Resp 18   Ht 5\' 11"  (1.803 m)   Wt 235 lb (106.6 kg)   SpO2 93%    BMI 32.78 kg/m   Physical Exam  Constitutional: He is oriented to person, place, and time. He appears well-developed and well-nourished. No distress.  HENT:  Head: Normocephalic and atraumatic.  Right Ear: Tympanic membrane, external ear and ear canal normal.  Left Ear: Tympanic membrane, external ear and ear canal normal.  Nose: Nose normal.  Mouth/Throat: Uvula is midline, oropharynx is clear and moist and mucous membranes are normal. No trismus in the jaw. No oropharyngeal exudate, posterior oropharyngeal erythema or tonsillar abscesses.  Eyes: EOM are normal. Pupils are equal, round, and reactive to light.  Neck: Normal range of motion. Neck supple. No tracheal deviation present.  Cardiovascular: Normal rate, regular rhythm, S1 normal, S2 normal, normal heart sounds, intact distal pulses and normal pulses.   Pulmonary/Chest: Effort normal and breath sounds normal. No respiratory distress. He has no decreased breath sounds. He has no wheezes. He has no rhonchi. He has no rales.  Abdominal: Normal appearance and bowel sounds are normal. There is no tenderness.  Musculoskeletal: Normal range of motion.  Neurological: He is alert and oriented to person, place, and time. Coordination normal.  Skin: Skin is warm and dry. He is not diaphoretic.  Psychiatric: He has a normal mood and affect. His speech is normal and behavior is normal. Thought content normal.  Nursing note and vitals reviewed.    ED Treatments / Results  DIAGNOSTIC STUDIES: Oxygen Saturation is 93% on RA, low by my interpretation.    COORDINATION OF CARE: 11:09 PM Discussed treatment plan with pt at bedside and pt agreed to plan.  Labs (all labs ordered are listed, but only abnormal results are displayed) Labs Reviewed - No data to display  EKG  EKG Interpretation None       Radiology Dg Chest 2 View  Result Date: 03/04/2016 CLINICAL DATA:  31 y/o  M; productive cough with chest pain. EXAM: CHEST  2 VIEW  COMPARISON:  01/17/2016 chest radiograph FINDINGS: The heart size and mediastinal contours are within normal limits and stable. Both lungs are clear. The visualized skeletal structures are unremarkable. IMPRESSION: No active cardiopulmonary disease. Electronically Signed   By: Mitzi Hansen M.D.   On: 03/04/2016 23:26    Procedures Procedures (including critical care time)  Medications Ordered in ED Medications - No data to display   Initial Impression / Assessment and Plan / ED Course  I have reviewed the triage vital signs and the nursing notes.  Pertinent labs & imaging results that were available during my care of the patient were reviewed by me and considered in my medical decision making (see chart for details).  Clinical Course    Final Clinical Impressions(s) / ED Diagnoses   {I have reviewed and evaluated the relevant imaging studies.    {I obtained HPI from historian.   ED Course:  Assessment: Pt is  a 30yM presents with URI symptoms x 3-4 days. No fevers. . On exam, pt in NAD. VSS. Afebrile. Lungs CTA, Heart RRR. Abdomen nontender/soft. Pt CXR negative for acute infiltrate. Patients symptoms are consistent with URI, likely viral etiology. Discussed that antibiotics are not indicated for viral infections. Pt will be discharged with symptomatic treatment.  Verbalizes understanding and is agreeable with plan. Pt is hemodynamically stable & in NAD prior to dc.  Disposition/Plan:  DC Home Additional Verbal discharge instructions given and discussed with patient.  Pt Instructed to f/u with PCP in the next week for evaluation and treatment of symptoms. Return precautions given Pt acknowledges and agrees with plan  Supervising Physician Donnetta HutchingBrian Cook, MD  Final diagnoses:  Upper respiratory tract infection, unspecified type    New Prescriptions New Prescriptions   No medications on file   I personally performed the services described in this documentation, which was  scribed in my presence. The recorded information has been reviewed and is accurate.    Audry Piliyler Zophia Marrone, PA-C 03/04/16 2329    Donnetta HutchingBrian Cook, MD 03/05/16 1257

## 2016-03-04 NOTE — ED Triage Notes (Signed)
Pt states that for the past four day he has had a non productive cough, with chest congestion and head congestion, OTC medications not working, denies fevers.

## 2016-03-04 NOTE — ED Notes (Signed)
Patient transported to X-ray 

## 2016-03-04 NOTE — Discharge Instructions (Signed)
Please read and follow all provided instructions.  Your diagnoses today include:  1. Upper respiratory tract infection, unspecified type     You appear to have an upper respiratory infection (URI). An upper respiratory tract infection, or cold, is a viral infection of the air passages leading to the lungs. It should improve gradually after 5-7 days. You may have a lingering cough that lasts for 2- 4 weeks after the infection.  Tests performed today include: Vital signs. See below for your results today.   Medications prescribed:   Take any prescribed medications only as directed. Treatment for your infection is aimed at treating the symptoms. There are no medications, such as antibiotics, that will cure your infection.   Home care instructions:  Follow any educational materials contained in this packet.   Your illness is contagious and can be spread to others, especially during the first 3 or 4 days. It cannot be cured by antibiotics or other medicines. Take basic precautions such as washing your hands often, covering your mouth when you cough or sneeze, and avoiding public places where you could spread your illness to others.   Please continue drinking plenty of fluids.  Use over-the-counter medicines as needed as directed on packaging for symptom relief.  You may also use ibuprofen or tylenol as directed on packaging for pain or fever.  Do not take multiple medicines containing Tylenol or acetaminophen to avoid taking too much of this medication.  Follow-up instructions: Please follow-up with your primary care provider in the next 3 days for further evaluation of your symptoms if you are not feeling better.   Return instructions:  Please return to the Emergency Department if you experience worsening symptoms.  RETURN IMMEDIATELY IF you develop shortness of breath, confusion or altered mental status, a new rash, become dizzy, faint, or poorly responsive, or are unable to be cared for at  home. Please return if you have persistent vomiting and cannot keep down fluids or develop a fever that is not controlled by tylenol or motrin.   Please return if you have any other emergent concerns.  Additional Information:  Your vital signs today were: BP 148/84    Pulse 95    Temp 98.7 F (37.1 C) (Oral)    Resp 18    Ht 5\' 11"  (1.803 m)    Wt 106.6 kg    SpO2 93%    BMI 32.78 kg/m  If your blood pressure (BP) was elevated above 135/85 this visit, please have this repeated by your doctor within one month. --------------

## 2016-03-04 NOTE — ED Notes (Signed)
Pt verbalized understanding discharge instructions and denies any further needs or questions at this time. VS stable, ambulatory and steady gait.   

## 2016-03-08 MED FILL — AMLODIPINE BESYLATE 10 MG T: 10 | 30 days supply | Qty: 30 | Fill #1

## 2016-04-16 ENCOUNTER — Encounter (HOSPITAL_COMMUNITY): Payer: Self-pay

## 2016-04-16 DIAGNOSIS — Z87891 Personal history of nicotine dependence: Secondary | ICD-10-CM | POA: Insufficient documentation

## 2016-04-16 DIAGNOSIS — I10 Essential (primary) hypertension: Secondary | ICD-10-CM | POA: Insufficient documentation

## 2016-04-16 DIAGNOSIS — R1084 Generalized abdominal pain: Secondary | ICD-10-CM | POA: Insufficient documentation

## 2016-04-16 DIAGNOSIS — Z79899 Other long term (current) drug therapy: Secondary | ICD-10-CM | POA: Insufficient documentation

## 2016-04-16 DIAGNOSIS — J45909 Unspecified asthma, uncomplicated: Secondary | ICD-10-CM | POA: Insufficient documentation

## 2016-04-16 MED FILL — AMLODIPINE BESYLATE 10 MG T: 10 | 30 days supply | Qty: 30 | Fill #2

## 2016-04-16 NOTE — ED Triage Notes (Signed)
Pt states that he has been experiencing R sided abdominal and R sided flank pain for about a week. He also is c/o burning with urination. Endorses nausea. Denies vomiting or diarrhea. A&Ox4. Ambulatory

## 2016-04-17 ENCOUNTER — Emergency Department (HOSPITAL_COMMUNITY): Payer: Self-pay

## 2016-04-17 ENCOUNTER — Emergency Department (HOSPITAL_COMMUNITY)
Admission: EM | Admit: 2016-04-17 | Discharge: 2016-04-17 | Disposition: A | Discharge status: Home / Self Care | Payer: Self-pay | Attending: Emergency Medicine | Admitting: Emergency Medicine

## 2016-04-17 DIAGNOSIS — R1084 Generalized abdominal pain: Secondary | ICD-10-CM

## 2016-04-17 LAB — COMPREHENSIVE METABOLIC PANEL
ALK PHOS: 47 U/L (ref 38–126)
ALT: 39 U/L (ref 17–63)
AST: 26 U/L (ref 15–41)
Albumin: 4.4 g/dL (ref 3.5–5.0)
Anion gap: 7 (ref 5–15)
BILIRUBIN TOTAL: 0.8 mg/dL (ref 0.3–1.2)
BUN: 15 mg/dL (ref 6–20)
CALCIUM: 9.2 mg/dL (ref 8.9–10.3)
CO2: 26 mmol/L (ref 22–32)
CREATININE: 0.9 mg/dL (ref 0.61–1.24)
Chloride: 107 mmol/L (ref 101–111)
Glucose, Bld: 95 mg/dL (ref 65–99)
Potassium: 3.9 mmol/L (ref 3.5–5.1)
Sodium: 140 mmol/L (ref 135–145)
TOTAL PROTEIN: 7.4 g/dL (ref 6.5–8.1)

## 2016-04-17 LAB — CBC
HCT: 43.2 % (ref 39.0–52.0)
Hemoglobin: 15.3 g/dL (ref 13.0–17.0)
MCH: 31.2 pg (ref 26.0–34.0)
MCHC: 35.4 g/dL (ref 30.0–36.0)
MCV: 88.2 fL (ref 78.0–100.0)
Platelets: 220 10*3/uL (ref 150–400)
RBC: 4.9 MIL/uL (ref 4.22–5.81)
RDW: 13.9 % (ref 11.5–15.5)
WBC: 6.9 10*3/uL (ref 4.0–10.5)

## 2016-04-17 LAB — URINALYSIS, ROUTINE W REFLEX MICROSCOPIC
Bilirubin Urine: NEGATIVE
Glucose, UA: NEGATIVE mg/dL
HGB URINE DIPSTICK: NEGATIVE
Ketones, ur: NEGATIVE mg/dL
Leukocytes, UA: NEGATIVE
Nitrite: NEGATIVE
PH: 5 (ref 5.0–8.0)
Protein, ur: NEGATIVE mg/dL
SPECIFIC GRAVITY, URINE: 1.015 (ref 1.005–1.030)

## 2016-04-17 LAB — LIPASE, BLOOD: Lipase: 47 U/L (ref 11–51)

## 2016-04-17 MED ORDER — IBUPROFEN 800 MG PO TABS: 800.0000 mg | ORAL_TABLET | Freq: Three times a day (TID) | ORAL | 0 refills | Status: DC

## 2016-04-17 MED ORDER — DICYCLOMINE HCL 20 MG PO TABS: 20.0000 mg | ORAL_TABLET | Freq: Two times a day (BID) | ORAL | 0 refills | Status: DC

## 2016-04-17 MED ORDER — TRAMADOL HCL 50 MG PO TABS: 50.0000 mg | ORAL_TABLET | Freq: Four times a day (QID) | ORAL | 0 refills | Status: DC | PRN

## 2016-04-17 NOTE — ED Provider Notes (Signed)
[facility]-EMERGENCY DEPT Provider Note   CSN: [id number] Arrival date & time: 04/16/16  2344   By signing my name below, I, Teofilo PodMatthew P. Jamison, attest that this documentation has been prepared under the direction and in the presence of Gilda Creasehristopher J Pollina, MD . Electronically Signed: Teofilo PodMatthew P. Jamison, ED Scribe. 04/17/2016. 2:58 AM.   History   Chief Complaint Chief Complaint  Patient presents with  . Abdominal Pain    R   . Flank Pain    R   The history is provided by the patient. No language interpreter was used.   HPI Comments:  Walter Howell is a 31 y.o. male who presents to the Emergency Department complaining of intermittent right sided abdominal pain x 1 week. Pt describes the pain as "mild." Pt complains of associated nausea, diarrhea. No alleviating factors noted. Pt denies vomiting.  Past Medical History:  Diagnosis Date  . Anxiety   . Asthma   . GERD (gastroesophageal reflux disease)   . Hypertension     Patient Active Problem List   Diagnosis Date Noted  . Anxiety and depression 12/06/2015  . Chronic bilateral low back pain without sciatica 12/06/2015  . Essential hypertension, benign 07/08/2013  . Dental cavities 07/08/2013  . Pain, dental 07/08/2013    History reviewed. No pertinent surgical history.     Home Medications    Prior to Admission medications   Medication Sig Start Date End Date Taking? Authorizing Provider  albuterol (PROVENTIL HFA;VENTOLIN HFA) 108 (90 BASE) MCG/ACT inhaler Inhale 2 puffs into the lungs every 6 (six) hours as needed for wheezing or shortness of breath.    Yes Historical Provider, MD  amLODipine (NORVASC) 10 MG tablet Take 1 tablet (10 mg total) by mouth daily. 12/06/15  Yes Josalyn Funches, MD  Multiple Vitamin (MULTIVITAMIN WITH MINERALS) TABS tablet Take 1 tablet by mouth daily.   Yes Historical Provider, MD  naproxen sodium (ANAPROX) 220 MG tablet Take 440 mg by mouth every 12 (twelve) hours as needed (pain).    Yes Historical Provider, MD  amoxicillin (AMOXIL) 500 MG capsule Take 2 capsules (1,000 mg total) by mouth 2 (two) times daily. Patient not taking: Reported on 04/17/2016 01/28/16   Joni ReiningNicole Pisciotta, PA-C  benzonatate (TESSALON) 100 MG capsule Take 1 capsule (100 mg total) by mouth every 8 (eight) hours. Patient not taking: Reported on 04/17/2016 03/04/16   Audry Piliyler Mohr, PA-C  dicyclomine (BENTYL) 20 MG tablet Take 1 tablet (20 mg total) by mouth 2 (two) times daily. 04/17/16   Gilda Creasehristopher J Pollina, MD  DULoxetine (CYMBALTA) 20 MG capsule Take 1 capsule (20 mg total) by mouth daily. Patient not taking: Reported on 04/17/2016 12/06/15   Dessa PhiJosalyn Funches, MD  fluticasone (FLONASE) 50 MCG/ACT nasal spray Place 2 sprays into both nostrils daily. Patient not taking: Reported on 04/17/2016 07/19/14   Doris Cheadleeepak Advani, MD  ibuprofen (ADVIL,MOTRIN) 800 MG tablet Take 1 tablet (800 mg total) by mouth 3 (three) times daily. 04/17/16   Gilda Creasehristopher J Pollina, MD  meloxicam (MOBIC) 7.5 MG tablet Take 1 tablet (7.5 mg total) by mouth daily. Patient not taking: Reported on 04/17/2016 12/06/15   Dessa PhiJosalyn Funches, MD  methocarbamol (ROBAXIN) 500 MG tablet Take 1 tablet (500 mg total) by mouth every 8 (eight) hours as needed for muscle spasms. Patient not taking: Reported on 04/17/2016 12/06/15   Dessa PhiJosalyn Funches, MD  ondansetron (ZOFRAN ODT) 4 MG disintegrating tablet Take 1 tablet (4 mg total) by mouth every 8 (eight) hours as needed  for nausea or vomiting. Patient not taking: Reported on 04/17/2016 12/11/15   Samantha Tripp Dowless, PA-C  traMADol (ULTRAM) 50 MG tablet Take 1 tablet (50 mg total) by mouth every 6 (six) hours as needed. 04/17/16   Gilda Crease, MD    Family History Family History  Problem Relation Age of Onset  . Diabetes Maternal Grandmother   . Hypertension Maternal Grandmother   . Diabetes Maternal Grandfather   . Diabetes Paternal Grandmother   . Heart disease Paternal Grandmother   . Diabetes  Paternal Grandfather     Social History Social History  Substance Use Topics  . Smoking status: Former Games developer  . Smokeless tobacco: Never Used  . Alcohol use No     Allergies   Prednisone   Review of Systems Review of Systems  Constitutional: Negative for fever.  Gastrointestinal: Positive for abdominal pain, diarrhea and nausea. Negative for vomiting.  All other systems reviewed and are negative.    Physical Exam Updated Vital Signs BP 150/98 (BP Location: Right Arm)   Pulse 74   Temp 98.4 F (36.9 C) (Oral)   Resp 20   SpO2 99%   Physical Exam  Constitutional: He is oriented to person, place, and time. He appears well-developed and well-nourished. No distress.  HENT:  Head: Normocephalic and atraumatic.  Right Ear: Hearing normal.  Left Ear: Hearing normal.  Nose: Nose normal.  Mouth/Throat: Oropharynx is clear and moist and mucous membranes are normal.  Eyes: Conjunctivae and EOM are normal. Pupils are equal, round, and reactive to light.  Neck: Normal range of motion. Neck supple.  Cardiovascular: Regular rhythm, S1 normal and S2 normal.  Exam reveals no gallop and no friction rub.   No murmur heard. Pulmonary/Chest: Effort normal and breath sounds normal. No respiratory distress. He exhibits no tenderness.  Abdominal: Soft. Normal appearance and bowel sounds are normal. There is no hepatosplenomegaly. There is tenderness. There is no rebound, no guarding, no tenderness at McBurney's point and negative Murphy's sign. No hernia.  Diffuse mild right sided tenderness.  Musculoskeletal: Normal range of motion.  Neurological: He is alert and oriented to person, place, and time. He has normal strength. No cranial nerve deficit or sensory deficit. Coordination normal. GCS eye subscore is 4. GCS verbal subscore is 5. GCS motor subscore is 6.  Skin: Skin is warm, dry and intact. No rash noted. No cyanosis.  Psychiatric: He has a normal mood and affect. His speech is  normal and behavior is normal. Thought content normal.  Nursing note and vitals reviewed.    ED Treatments / Results  DIAGNOSTIC STUDIES:  Oxygen Saturation is 99% on RA, normal by my interpretation.    COORDINATION OF CARE:  2:58 AM Discussed treatment plan with pt at bedside and pt agreed to plan.   Labs (all labs ordered are listed, but only abnormal results are displayed) Labs Reviewed  URINALYSIS, ROUTINE W REFLEX MICROSCOPIC  LIPASE, BLOOD  COMPREHENSIVE METABOLIC PANEL  CBC    EKG  EKG Interpretation None       Radiology Ct Renal Stone Study  Result Date: 04/17/2016 CLINICAL DATA:  31 year old male with right flank pain for 1 week. Nausea and diarrhea. EXAM: CT ABDOMEN AND PELVIS WITHOUT CONTRAST TECHNIQUE: Multidetector CT imaging of the abdomen and pelvis was performed following the standard protocol without IV contrast. COMPARISON:  None. FINDINGS: Evaluation of this exam is limited in the absence of intravenous contrast. Lower chest: The visualized lung bases are clear. There is  coronary vascular calcification which is advanced for the patient's age. Correlation with clinical exam and cardiology referral recommended. No intra-abdominal free air or free fluid. Hepatobiliary: Diffuse fatty infiltration of the liver. No intrahepatic biliary ductal dilatation. The gallbladder is unremarkable. Pancreas: Unremarkable. No pancreatic ductal dilatation or surrounding inflammatory changes. Spleen: Normal in size without focal abnormality. Adrenals/Urinary Tract: The adrenal glands are unremarkable. Multiple small nonobstructing right renal calculi measure up to 2-3 mm. No hydronephrosis. The left kidney is unremarkable. The visualized ureters and urinary bladder appear unremarkable as well. Stomach/Bowel: There is moderate stool throughout the colon. No evidence of bowel obstruction or active inflammation. Normal appendix. Vascular/Lymphatic: The abdominal aorta and IVC are grossly  unremarkable on this noncontrast study. No portal venous gas identified. There is no adenopathy. Reproductive: The prostate and seminal vesicles are grossly unremarkable. Other: Small fat containing umbilical hernia. Musculoskeletal: No acute or significant osseous findings. IMPRESSION: 1. Small nonobstructing right renal calculi. No hydronephrosis or obstructing stone. 2. Coronary vascular calcification, advanced for the patient's age. Correlation with clinical exam and cardiology referral recommended. 3. Fatty infiltration of the liver. 4. No bowel obstruction or active inflammation.  Normal appendix. Electronically Signed   By: Elgie Collard M.D.   On: 04/17/2016 03:51    Procedures Procedures (including critical care time)  Medications Ordered in ED Medications - No data to display   Initial Impression / Assessment and Plan / ED Course  I have reviewed the triage vital signs and the nursing notes.  Pertinent labs & imaging results that were available during my care of the patient were reviewed by me and considered in my medical decision making (see chart for details).     Patient presents to the emergency department for evaluation of abdominal pain. Patient experiencing right-sided abdominal pain for approximately a week. He reports that there is a hardness and fullness in the right upper abdomen. Examination was unremarkable, no signs of acute surgical process. Blood work and urine normal. CT scan performed, no acute pathology noted.  Final Clinical Impressions(s) / ED Diagnoses   Final diagnoses:  Generalized abdominal pain    New Prescriptions New Prescriptions   DICYCLOMINE (BENTYL) 20 MG TABLET    Take 1 tablet (20 mg total) by mouth 2 (two) times daily.   IBUPROFEN (ADVIL,MOTRIN) 800 MG TABLET    Take 1 tablet (800 mg total) by mouth 3 (three) times daily.   TRAMADOL (ULTRAM) 50 MG TABLET    Take 1 tablet (50 mg total) by mouth every 6 (six) hours as needed.  I personally  performed the services described in this documentation, which was scribed in my presence. The recorded information has been reviewed and is accurate.     Gilda Crease, MD 04/17/16 [phone]

## 2016-05-27 ENCOUNTER — Encounter (HOSPITAL_COMMUNITY): Payer: Self-pay | Admitting: Emergency Medicine

## 2016-05-27 ENCOUNTER — Emergency Department (HOSPITAL_COMMUNITY)
Admission: EM | Admit: 2016-05-27 | Discharge: 2016-05-28 | Disposition: A | Discharge status: Home / Self Care | Payer: Self-pay | Attending: Emergency Medicine | Admitting: Emergency Medicine

## 2016-05-27 DIAGNOSIS — R197 Diarrhea, unspecified: Secondary | ICD-10-CM | POA: Insufficient documentation

## 2016-05-27 DIAGNOSIS — R112 Nausea with vomiting, unspecified: Secondary | ICD-10-CM | POA: Insufficient documentation

## 2016-05-27 DIAGNOSIS — Z87891 Personal history of nicotine dependence: Secondary | ICD-10-CM | POA: Insufficient documentation

## 2016-05-27 DIAGNOSIS — I10 Essential (primary) hypertension: Secondary | ICD-10-CM | POA: Insufficient documentation

## 2016-05-27 DIAGNOSIS — J45909 Unspecified asthma, uncomplicated: Secondary | ICD-10-CM | POA: Insufficient documentation

## 2016-05-27 LAB — URINALYSIS, ROUTINE W REFLEX MICROSCOPIC
Bilirubin Urine: NEGATIVE
GLUCOSE, UA: NEGATIVE mg/dL
HGB URINE DIPSTICK: NEGATIVE
KETONES UR: NEGATIVE mg/dL
Leukocytes, UA: NEGATIVE
Nitrite: NEGATIVE
PH: 6 (ref 5.0–8.0)
PROTEIN: NEGATIVE mg/dL
Specific Gravity, Urine: 1.003 — ABNORMAL LOW (ref 1.005–1.030)

## 2016-05-27 LAB — CBC
HEMATOCRIT: 44.4 % (ref 39.0–52.0)
Hemoglobin: 15.5 g/dL (ref 13.0–17.0)
MCH: 30.7 pg (ref 26.0–34.0)
MCHC: 34.9 g/dL (ref 30.0–36.0)
MCV: 87.9 fL (ref 78.0–100.0)
PLATELETS: 217 10*3/uL (ref 150–400)
RBC: 5.05 MIL/uL (ref 4.22–5.81)
RDW: 13.6 % (ref 11.5–15.5)
WBC: 6.5 10*3/uL (ref 4.0–10.5)

## 2016-05-27 NOTE — ED Triage Notes (Signed)
Pt from home with c/o emesis, diarrhea, and upper right sided abdominal pain x 2 days. Pt states he thinks the abd pain is secondary to emesis. Pt states he has had 2 episodes of emesis and 2 episodes of diarrhea today. Pt has taken Pepto bismol today around 0900 and  1600. Pt's last emesis was this morning

## 2016-05-28 LAB — COMPREHENSIVE METABOLIC PANEL
ALK PHOS: 51 U/L (ref 38–126)
ALT: 36 U/L (ref 17–63)
ANION GAP: 5 (ref 5–15)
AST: 22 U/L (ref 15–41)
Albumin: 4.2 g/dL (ref 3.5–5.0)
BILIRUBIN TOTAL: 0.6 mg/dL (ref 0.3–1.2)
BUN: 12 mg/dL (ref 6–20)
CALCIUM: 9.6 mg/dL (ref 8.9–10.3)
CO2: 28 mmol/L (ref 22–32)
Chloride: 108 mmol/L (ref 101–111)
Creatinine, Ser: 0.94 mg/dL (ref 0.61–1.24)
GFR calc Af Amer: >60 mL/min (ref >60)
Glucose, Bld: 92 mg/dL (ref 65–99)
POTASSIUM: 4 mmol/L (ref 3.5–5.1)
Sodium: 141 mmol/L (ref 135–145)
Total Protein: 7.1 g/dL (ref 6.5–8.1)

## 2016-05-28 LAB — LIPASE, BLOOD: Lipase: 32 U/L (ref 11–51)

## 2016-05-28 MED ORDER — ONDANSETRON 8 MG PO TBDP
8.0000 mg | ORAL_TABLET | Freq: Once | ORAL | Status: AC
  Administered 2016-05-28: 8 mg via ORAL
  Filled 2016-05-28: qty 1

## 2016-05-28 MED ORDER — GI COCKTAIL ~~LOC~~
30.0000 mL | Freq: Once | ORAL | Status: AC
Start: 2016-05-28 — End: 2016-05-28
  Administered 2016-05-28: 30 mL via ORAL
  Filled 2016-05-28: qty 30

## 2016-05-28 MED ORDER — ONDANSETRON 8 MG PO TBDP: 8.0000 mg | ORAL_TABLET | Freq: Three times a day (TID) | ORAL | 0 refills | Status: DC | PRN

## 2016-05-28 NOTE — Discharge Instructions (Signed)
Drink plenty of fluids at home. Take Zofran as prescribed as needed for nausea and vomiting. You can take Imodium for diarrhea if needed. Follow-up with family doctor or return if not improving or worsening symptoms.

## 2016-05-28 NOTE — ED Provider Notes (Signed)
[facility]-EMERGENCY DEPT Provider Note   CSN: [id number] Arrival date & time: 05/27/16  2157  By signing my name below, I, Orpah Cobb, attest that this documentation has been prepared under the direction and in the presence of Dulcinea Kinser, PA-C. Electronically Signed: Lyndon Code., ED Scribe. 05/28/16. 12:10 AM.    History   Chief Complaint Chief Complaint  Patient presents with  . Abdominal Pain  . Emesis  . Diarrhea    HPI Walter Howell is a 31 y.o. male who presents to the Emergency Department complaining of emesis with onset x2 days. Pt states that he has had constant nausea and vomiting for the past x2 days. He also reports x2 episodes of diarrhea today. Pt notes sick contacts at work. He reports diarrhea, nausea, vomiting, R sided abdominal pain, chills. He has taken Pepto Bismol with mild releif. Pt denies sore throat, rhinorrhea.   The history is provided by the patient. No language interpreter was used.    Past Medical History:  Diagnosis Date  . Anxiety   . Asthma   . GERD (gastroesophageal reflux disease)   . Hypertension     Patient Active Problem List   Diagnosis Date Noted  . Anxiety and depression 12/06/2015  . Chronic bilateral low back pain without sciatica 12/06/2015  . Essential hypertension, benign 07/08/2013  . Dental cavities 07/08/2013  . Pain, dental 07/08/2013    History reviewed. No pertinent surgical history.     Home Medications    Prior to Admission medications   Medication Sig Start Date End Date Taking? Authorizing Provider  albuterol (PROVENTIL HFA;VENTOLIN HFA) 108 (90 BASE) MCG/ACT inhaler Inhale 2 puffs into the lungs every 6 (six) hours as needed for wheezing or shortness of breath.    Yes Historical Provider, MD  amLODipine (NORVASC) 10 MG tablet Take 1 tablet (10 mg total) by mouth daily. 12/06/15  Yes Josalyn Funches, MD  bismuth subsalicylate (PEPTO BISMOL) 262 MG/15ML suspension Take 30 mLs by mouth  every 6 (six) hours as needed for indigestion.   Yes Historical Provider, MD  Multiple Vitamin (MULTIVITAMIN WITH MINERALS) TABS tablet Take 1 tablet by mouth daily.   Yes Historical Provider, MD  naproxen sodium (ANAPROX) 220 MG tablet Take 440 mg by mouth every 12 (twelve) hours as needed (pain).   Yes Historical Provider, MD  amoxicillin (AMOXIL) 500 MG capsule Take 2 capsules (1,000 mg total) by mouth 2 (two) times daily. Patient not taking: Reported on 04/17/2016 01/28/16   Joni Reining Pisciotta, PA-C  benzonatate (TESSALON) 100 MG capsule Take 1 capsule (100 mg total) by mouth every 8 (eight) hours. Patient not taking: Reported on 04/17/2016 03/04/16   Audry Pili, PA-C  dicyclomine (BENTYL) 20 MG tablet Take 1 tablet (20 mg total) by mouth 2 (two) times daily. Patient not taking: Reported on 05/27/2016 04/17/16   Gilda Crease, MD  DULoxetine (CYMBALTA) 20 MG capsule Take 1 capsule (20 mg total) by mouth daily. Patient not taking: Reported on 04/17/2016 12/06/15   Dessa Phi, MD  fluticasone (FLONASE) 50 MCG/ACT nasal spray Place 2 sprays into both nostrils daily. Patient not taking: Reported on 04/17/2016 07/19/14   Doris Cheadle, MD  ibuprofen (ADVIL,MOTRIN) 800 MG tablet Take 1 tablet (800 mg total) by mouth 3 (three) times daily. Patient not taking: Reported on 05/27/2016 04/17/16   Gilda Crease, MD  meloxicam (MOBIC) 7.5 MG tablet Take 1 tablet (7.5 mg total) by mouth daily. Patient not taking: Reported on 04/17/2016 12/06/15  Dessa Phi, MD  methocarbamol (ROBAXIN) 500 MG tablet Take 1 tablet (500 mg total) by mouth every 8 (eight) hours as needed for muscle spasms. Patient not taking: Reported on 04/17/2016 12/06/15   Dessa Phi, MD  ondansetron (ZOFRAN ODT) 4 MG disintegrating tablet Take 1 tablet (4 mg total) by mouth every 8 (eight) hours as needed for nausea or vomiting. Patient not taking: Reported on 04/17/2016 12/11/15   Samantha Tripp Dowless, PA-C  traMADol  (ULTRAM) 50 MG tablet Take 1 tablet (50 mg total) by mouth every 6 (six) hours as needed. Patient not taking: Reported on 05/27/2016 04/17/16   Gilda Crease, MD    Family History Family History  Problem Relation Age of Onset  . Diabetes Maternal Grandmother   . Hypertension Maternal Grandmother   . Diabetes Maternal Grandfather   . Diabetes Paternal Grandmother   . Heart disease Paternal Grandmother   . Diabetes Paternal Grandfather     Social History Social History  Substance Use Topics  . Smoking status: Former Games developer  . Smokeless tobacco: Never Used  . Alcohol use No     Allergies   Prednisone   Review of Systems Review of Systems  Constitutional: Positive for chills. Negative for fever.  HENT: Negative for rhinorrhea and sore throat.   Gastrointestinal: Positive for abdominal pain, diarrhea, nausea and vomiting.  All other systems reviewed and are negative.    Physical Exam Updated Vital Signs BP (!) 143/88 (BP Location: Left Arm)   Pulse 72   Temp 98.8 F (37.1 C) (Oral)   Resp 18   Ht  (1.803 m)   Wt 250 lb (113.4 kg)   SpO2 99%   BMI 34.87 kg/m   Physical Exam  Constitutional: He appears well-developed and well-nourished.  HENT:  Head: Normocephalic and atraumatic.  Eyes: Conjunctivae are normal.  Neck: Neck supple.  Cardiovascular: Normal rate, regular rhythm and normal heart sounds.   No murmur heard. Pulmonary/Chest: Effort normal and breath sounds normal. No respiratory distress. He has no wheezes. He has no rales.  Abdominal: Soft. Bowel sounds are normal. He exhibits no distension. There is no tenderness. There is no guarding.  Musculoskeletal: He exhibits no edema.  Neurological: He is alert.  Skin: Skin is warm and dry.  Psychiatric: He has a normal mood and affect.  Nursing note and vitals reviewed.    ED Treatments / Results   DIAGNOSTIC STUDIES: Oxygen Saturation is 99% on RA, normal by my interpretation.    COORDINATION OF CARE: 12:11 AM-Discussed next steps with pt. Pt verbalized understanding and is agreeable with the plan.    Labs (all labs ordered are listed, but only abnormal results are displayed) Labs Reviewed  URINALYSIS, ROUTINE W REFLEX MICROSCOPIC - Abnormal; Notable for the following:       Result Value   Color, Urine COLORLESS (*)    Specific Gravity, Urine 1.003 (*)    All other components within normal limits  LIPASE, BLOOD  COMPREHENSIVE METABOLIC PANEL  CBC    EKG  EKG Interpretation None       Radiology No results found.  Procedures Procedures (including critical care time)  Medications Ordered in ED Medications - No data to display   Initial Impression / Assessment and Plan / ED Course  I have reviewed the triage vital signs and the nursing notes.  Pertinent labs & imaging results that were available during my care of the patient were reviewed by me and considered in my medical  decision making (see chart for details).     Pt in ED with complaint of nausea, vomiting, diarrhea for two days. States it is improving, states he is able to drink water with no vomiting. Only two episodes of diarrhea today. Will check labs, UA.   1:48 AM Labs all normal. Patient was given GI cocktail and Zofran. He states he feels much better. He is able to drink Sprite with no vomiting. He is requesting a work note. Stable for discharge home. Vital signs are normal. Abdomen is benign. I'll discharge home with Zofran. Advised to take Imodium if needed for diarrhea. Follow-up with family doctor or return if worsening.  Vitals:   05/27/16 2206 05/27/16 2253 05/28/16 0120  BP: (!) 143/88  130/85  Pulse: 72  (!) 57  Resp: 18  18  Temp: 98.8 F (37.1 C)    TempSrc: Oral    SpO2: 99%  97%  Weight:  113.4 kg   Height:   (1.803 m)      Final Clinical Impressions(s) / ED Diagnoses   Final diagnoses:  Nausea vomiting and diarrhea    New Prescriptions New  Prescriptions   ONDANSETRON (ZOFRAN ODT) 8 MG DISINTEGRATING TABLET    Take 1 tablet (8 mg total) by mouth every 8 (eight) hours as needed for nausea or vomiting.   I personally performed the services described in this documentation, which was scribed in my presence. The recorded information has been reviewed and is accurate.     Jaynie Crumble, PA-C 05/28/16 7829    Zadie Rhine, MD 05/28/16 [phone]

## 2016-05-28 NOTE — ED Notes (Signed)
Pt ambulatory and independent at discharge.  Verbalized understanding of discharge instructions 

## 2016-05-28 NOTE — ED Notes (Signed)
Pt ambulatory to restroom

## 2016-06-05 MED FILL — AMLODIPINE BESYLATE 10 MG T: 10 | 30 days supply | Qty: 30 | Fill #3

## 2016-07-11 ENCOUNTER — Encounter: Payer: Self-pay | Admitting: Family Medicine

## 2016-08-07 MED FILL — DULoxetine HCL 20 MG CPEP: 20 | 30 days supply | Qty: 30 | Fill #1

## 2016-08-07 MED FILL — AMLODIPINE BESYLATE 10 MG T: 10 | 30 days supply | Qty: 30 | Fill #4

## 2016-08-14 ENCOUNTER — Encounter: Payer: Self-pay | Admitting: Family Medicine

## 2016-08-14 NOTE — Progress Notes (Deleted)
SUBJECTIVE:  Walter BondBrandon Charles Howell is a 31 y.o. male presenting for his annual checkup. Current Outpatient Prescriptions  Medication Sig Dispense Refill  . albuterol (PROVENTIL HFA;VENTOLIN HFA) 108 (90 BASE) MCG/ACT inhaler Inhale 2 puffs into the lungs every 6 (six) hours as needed for wheezing or shortness of breath.     Marland Kitchen. amLODipine (NORVASC) 10 MG tablet Take 1 tablet (10 mg total) by mouth daily. 30 tablet 11  . amoxicillin (AMOXIL) 500 MG capsule Take 2 capsules (1,000 mg total) by mouth 2 (two) times daily. (Patient not taking: Reported on 04/17/2016) 40 capsule 0  . benzonatate (TESSALON) 100 MG capsule Take 1 capsule (100 mg total) by mouth every 8 (eight) hours. (Patient not taking: Reported on 04/17/2016) 21 capsule 0  . bismuth subsalicylate (PEPTO BISMOL) 262 MG/15ML suspension Take 30 mLs by mouth every 6 (six) hours as needed for indigestion.    . dicyclomine (BENTYL) 20 MG tablet Take 1 tablet (20 mg total) by mouth 2 (two) times daily. (Patient not taking: Reported on 05/27/2016) 20 tablet 0  . DULoxetine (CYMBALTA) 20 MG capsule Take 1 capsule (20 mg total) by mouth daily. (Patient not taking: Reported on 04/17/2016) 30 capsule 2  . fluticasone (FLONASE) 50 MCG/ACT nasal spray Place 2 sprays into both nostrils daily. (Patient not taking: Reported on 04/17/2016) 16 g 2  . ibuprofen (ADVIL,MOTRIN) 800 MG tablet Take 1 tablet (800 mg total) by mouth 3 (three) times daily. (Patient not taking: Reported on 05/27/2016) 21 tablet 0  . meloxicam (MOBIC) 7.5 MG tablet Take 1 tablet (7.5 mg total) by mouth daily. (Patient not taking: Reported on 04/17/2016) 30 tablet 2  . methocarbamol (ROBAXIN) 500 MG tablet Take 1 tablet (500 mg total) by mouth every 8 (eight) hours as needed for muscle spasms. (Patient not taking: Reported on 04/17/2016) 60 tablet 2  . Multiple Vitamin (MULTIVITAMIN WITH MINERALS) TABS tablet Take 1 tablet by mouth daily.    . naproxen sodium (ANAPROX) 220 MG tablet Take 440 mg by  mouth every 12 (twelve) hours as needed (pain).    . ondansetron (ZOFRAN ODT) 8 MG disintegrating tablet Take 1 tablet (8 mg total) by mouth every 8 (eight) hours as needed for nausea or vomiting. 10 tablet 0  . traMADol (ULTRAM) 50 MG tablet Take 1 tablet (50 mg total) by mouth every 6 (six) hours as needed. (Patient not taking: Reported on 05/27/2016) 15 tablet 0   No current facility-administered medications for this visit.    Allergies: Prednisone   ROS:  Feeling well. No dyspnea or chest pain on exertion. No abdominal pain, change in bowel habits, black or bloody stools. No urinary tract or prostatic symptoms. No neurological complaints.  OBJECTIVE:  The patient appears well, alert, oriented x 3, in no distress.  There were no vitals taken for this visit. ENT normal.  Neck supple. No adenopathy or thyromegaly. PERLA. Lungs are clear, good air entry, no wheezes, rhonchi or rales. S1 and S2 normal, no murmurs, regular rate and rhythm. Abdomen is soft without tenderness, guarding, mass or organomegaly. GU exam: {pe male genitalia:310182::"no penile lesions or discharge, no testicular masses or tenderness, no hernias"}.  Extremities show no edema, normal peripheral pulses. Neurological is normal without focal findings.  ASSESSMENT:  healthy adult male  PLAN:  {lifestyle advice:315251}

## 2016-08-26 ENCOUNTER — Encounter (HOSPITAL_COMMUNITY): Payer: Self-pay | Admitting: Emergency Medicine

## 2016-08-26 ENCOUNTER — Emergency Department (HOSPITAL_COMMUNITY)
Admission: EM | Admit: 2016-08-26 | Discharge: 2016-08-26 | Disposition: A | Discharge status: Home / Self Care | Payer: Self-pay | Attending: Emergency Medicine | Admitting: Emergency Medicine

## 2016-08-26 ENCOUNTER — Ambulatory Visit (HOSPITAL_COMMUNITY)
Admission: EM | Admit: 2016-08-26 | Discharge: 2016-08-26 | Disposition: A | Discharge status: Home / Self Care | Payer: Self-pay | Attending: Family Medicine | Admitting: Family Medicine

## 2016-08-26 DIAGNOSIS — K529 Noninfective gastroenteritis and colitis, unspecified: Secondary | ICD-10-CM

## 2016-08-26 DIAGNOSIS — Z5321 Procedure and treatment not carried out due to patient leaving prior to being seen by health care provider: Secondary | ICD-10-CM | POA: Insufficient documentation

## 2016-08-26 DIAGNOSIS — M545 Low back pain, unspecified: Secondary | ICD-10-CM

## 2016-08-26 DIAGNOSIS — R197 Diarrhea, unspecified: Secondary | ICD-10-CM | POA: Insufficient documentation

## 2016-08-26 DIAGNOSIS — R112 Nausea with vomiting, unspecified: Secondary | ICD-10-CM | POA: Insufficient documentation

## 2016-08-26 LAB — POCT URINALYSIS DIP (DEVICE)
BILIRUBIN URINE: NEGATIVE
Glucose, UA: NEGATIVE mg/dL
KETONES UR: NEGATIVE mg/dL
LEUKOCYTES UA: NEGATIVE
NITRITE: NEGATIVE
Protein, ur: NEGATIVE mg/dL
Specific Gravity, Urine: 1.015 (ref 1.005–1.030)
Urobilinogen, UA: 0.2 mg/dL (ref 0.0–1.0)
pH: 6 (ref 5.0–8.0)

## 2016-08-26 MED ORDER — CYCLOBENZAPRINE HCL 10 MG PO TABS: 10.0000 mg | ORAL_TABLET | Freq: Two times a day (BID) | ORAL | 0 refills | Status: DC | PRN

## 2016-08-26 MED ORDER — ONDANSETRON HCL 4 MG PO TABS: 4.0000 mg | ORAL_TABLET | Freq: Four times a day (QID) | ORAL | 0 refills | Status: DC

## 2016-08-26 NOTE — ED Triage Notes (Signed)
[facility] a [facility] pain and nausea x 3 days. The patient denied any known injury but did report urinary frequency.

## 2016-08-26 NOTE — ED Provider Notes (Signed)
[facility]    CSN: [id number] Arrival date & time: 08/26/16  1331     History   Chief Complaint Chief Complaint  Patient presents with  . Back Pain  . Nausea    HPI Walter Howell is a 31 y.o. male.   HPI Patient presents with a two-day history of right-sided low back pain and nausea, vomiting, and diarrhea. He had 2 episodes of vomiting 2 days ago and has not had any since then. He continues to have diarrhea and nausea. He is not having abdominal pain, fevers, recent travel, recent diabetic use, medication change, or sick contacts. Regarding his back pain, he has a history of chronic low back pain. He does not do any home stretches or exercises routinely. He denies any numbness, tingling, weakness, or bowel/bladder incontinence.  Past Medical History:  Diagnosis Date  . Anxiety   . Asthma   . GERD (gastroesophageal reflux disease)   . Hypertension     Patient Active Problem List   Diagnosis Date Noted  . Anxiety and depression 12/06/2015  . Chronic bilateral low back pain without sciatica 12/06/2015  . Essential hypertension, benign 07/08/2013  . Dental cavities 07/08/2013  . Pain, dental 07/08/2013    History reviewed. No pertinent surgical history.   Home Medications    Prior to Admission medications   Medication Sig Start Date End Date Taking? Authorizing Provider  amLODipine (NORVASC) 10 MG tablet Take 1 tablet (10 mg total) by mouth daily. 12/06/15  Yes Funches, Josalyn, MD  cetirizine (ZYRTEC) 10 MG tablet Take 10 mg by mouth daily.   Yes [provider]  DULoxetine (CYMBALTA) 20 MG capsule Take 1 capsule (20 mg total) by mouth daily. 12/06/15  Yes Funches, Josalyn, MD  cyclobenzaprine (FLEXERIL) 10 MG tablet Take 1 tablet (10 mg total) by mouth 2 (two) times daily as needed for muscle spasms. 08/26/16   Sharlene Dory, DO  ondansetron (ZOFRAN) 4 MG tablet Take 1 tablet (4 mg total) by mouth every 6 (six) hours. 08/26/16    Sharlene Dory, DO    Family History Family History  Problem Relation Age of Onset  . Diabetes Maternal Grandmother   . Hypertension Maternal Grandmother   . Diabetes Maternal Grandfather   . Diabetes Paternal Grandmother   . Heart disease Paternal Grandmother   . Diabetes Paternal Grandfather     Social History Social History  Substance Use Topics  . Smoking status: Former Games developer  . Smokeless tobacco: Never Used  . Alcohol use No     Allergies   Prednisone   Review of Systems Review of Systems  Constitutional: Negative for fever.  Gastrointestinal: Positive for diarrhea and nausea.     Physical Exam Updated Vital Signs 166/89 RR 18 HR 74 SpO2 99% T 97.9 F (36.6 C)  Physical Exam  Constitutional: He is oriented to person, place, and time. He appears well-developed and well-nourished.  HENT:  Head: Normocephalic and atraumatic.  Mouth/Throat: Oropharynx is clear and moist.  Cardiovascular: Normal rate and regular rhythm.   No murmur heard. Pulmonary/Chest: Effort normal and breath sounds normal. No respiratory distress.  Abdominal: Soft. Bowel sounds are normal. He exhibits no distension.  +lower quadrant pain Neg Carnett's, Rovsing's, McBurney's, Murphy's  Musculoskeletal:  Mild TTP over R lumbar paraspinal musculature. Neg straight neg and Lesegue's b/l, very poor hamstring flexion ROM b/l  Neurological: He is alert and oriented to person, place, and time. He displays normal reflexes.  Skin: Skin is  warm and dry.  Psychiatric: He has a normal mood and affect. Judgment normal.     UC Treatments / Results  Procedures Procedures none  Initial Impression / Assessment and Plan / UC Course  I have reviewed the triage vital signs and the nursing notes.  Pertinent labs & imaging results that were available during my care of the patient were reviewed by me and considered in my medical decision making (see chart for details).    Patient presents with  abdominal complaints likely caused by a viral gastroenteritis. He is to follow-up with a primary care physician if his diarrhea fails to improve for possible stool culture. No red flag signs/symptoms on physical exam or in the history. He clearly does not routinely do stretching at home. He was given home stretches and exercises to help with low back pain. Encouraged hydration. Follow-up with PCP as needed. He is discharged in stable condition. The patient voiced understanding and agreement to the plan.   Final Clinical Impressions(s) / UC Diagnoses   Final diagnoses:  Acute right-sided low back pain without sciatica  Gastroenteritis    New Prescriptions Discharge Medication List as of 08/26/2016  2:35 PM    START taking these medications   Details  cyclobenzaprine (FLEXERIL) 10 MG tablet Take 1 tablet (10 mg total) by mouth 2 (two) times daily as needed for muscle spasms., Starting Sun 08/26/2016, Normal    ondansetron (ZOFRAN) 4 MG tablet Take 1 tablet (4 mg total) by mouth every 6 (six) hours., Starting Sun 08/26/2016, Normal         Sharlene DoryWendling, Nicholas Paul, DO 08/26/16 1525

## 2016-08-26 NOTE — Discharge Instructions (Signed)
EXERCISES  RANGE OF MOTION (ROM) AND STRETCHING EXERCISES - Low Back Sprain Most people with lower back pain will find that their symptoms get worse with excessive bending forward (flexion) or arching at the lower back (extension). The exercises that will help resolve your symptoms will focus on the opposite motion.  Your physician, physical therapist or athletic trainer will help you determine which exercises will be most helpful to resolve your lower back pain. Do not complete any exercises without first consulting with your caregiver. Discontinue any exercises which make your symptoms worse, until you speak to your caregiver. If you have pain, numbness or tingling which travels down into your buttocks, leg or foot, the goal of the therapy is for these symptoms to move closer to your back and eventually resolve. Sometimes, these leg symptoms will get better, but your lower back pain may worsen. This is often an indication of progress in your rehabilitation. Be very alert to any changes in your symptoms and the activities in which you participated in the 24 hours prior to the change. Sharing this information with your caregiver will allow him or her to most efficiently treat your condition. These exercises may help you when beginning to rehabilitate your injury. Your symptoms may resolve with or without further involvement from your physician, physical therapist or athletic trainer. While completing these exercises, remember:  Restoring tissue flexibility helps normal motion to return to the joints. This allows healthier, less painful movement and activity. An effective stretch should be held for at least 30 seconds. A stretch should never be painful. You should only feel a gentle lengthening or release in the stretched tissue. FLEXION RANGE OF MOTION AND STRETCHING EXERCISES:  STRETCH - Flexion, Single Knee to Chest  Lie on a firm bed or floor with both legs extended in front of you. Keeping one leg  in contact with the floor, bring your opposite knee to your chest. Hold your leg in place by either grabbing behind your thigh or at your knee. Pull until you feel a gentle stretch in your low back. Hold 15-20 seconds. Slowly release your grasp and repeat the exercise with the opposite side. Repeat 2 times. Complete this exercise 1-2 times per day.   STRETCH - Flexion, Double Knee to Chest Lie on a firm bed or floor with both legs extended in front of you. Keeping one leg in contact with the floor, bring your opposite knee to your chest. Tense your stomach muscles to support your back and then lift your other knee to your chest. Hold your legs in place by either grabbing behind your thighs or at your knees. Pull both knees toward your chest until you feel a gentle stretch in your low back. Hold 15-20 seconds. Tense your stomach muscles and slowly return one leg at a time to the floor. Repeat 2 times. Complete this exercise 1-2 times per day.   STRETCH - Low Trunk Rotation Lie on a firm bed or floor. Keeping your legs in front of you, bend your knees so they are both pointed toward the ceiling and your feet are flat on the floor. Extend your arms out to the side. This will stabilize your upper body by keeping your shoulders in contact with the floor. Gently and slowly drop both knees together to one side until you feel a gentle stretch in your low back. Hold for 15-20 seconds. Tense your stomach muscles to support your lower back as you bring your knees back to the starting  position. Repeat the exercise to the other side. Repeat 2 times. Complete this exercise 1-2 times per day  EXTENSION RANGE OF MOTION AND FLEXIBILITY EXERCISES:  STRETCH - Extension, Prone on Elbows  Lie on your stomach on the floor, a bed will be too soft. Place your palms about shoulder width apart and at the height of your head. Place your elbows under your shoulders. If this is too painful, stack pillows under your  chest. Allow your body to relax so that your hips drop lower and make contact more completely with the floor. Hold this position for 15-20 seconds. Slowly return to lying flat on the floor. Repeat 2 times. Complete this exercise 1-2 times per day.   RANGE OF MOTION - Extension, Prone Press Ups Lie on your stomach on the floor, a bed will be too soft. Place your palms about shoulder width apart and at the height of your head. Keeping your back as relaxed as possible, slowly straighten your elbows while keeping your hips on the floor. You may adjust the placement of your hands to maximize your comfort. As you gain motion, your hands will come more underneath your shoulders. Hold this position 15-20 seconds. Slowly return to lying flat on the floor. Repeat 2 times. Complete this exercise 1-2 times per day.   RANGE OF MOTION- Quadruped, Neutral Spine  Assume a hands and knees position on a firm surface. Keep your hands under your shoulders and your knees under your hips. You may place padding under your knees for comfort. Drop your head and point your tailbone toward the ground below you. This will round out your lower back like an angry cat. Hold this position for 15-20 seconds. Slowly lift your head and release your tail bone so that your back sags into a large arch, like an old horse. Hold this position for 15-20 seconds. Repeat this until you feel limber in your low back. Now, find your "sweet spot." This will be the most comfortable position somewhere between the two previous positions. This is your neutral spine. Once you have found this position, tense your stomach muscles to support your low back. Hold this position for 15-20 seconds. Repeat 2 times. Complete this exercise 1-2 times per day.  STRENGTHENING EXERCISES - Low Back Sprain These exercises may help you when beginning to rehabilitate your injury. These exercises should be done near your "sweet spot." This is the neutral, low-back  arch, somewhere between fully rounded and fully arched, that is your least painful position. When performed in this safe range of motion, these exercises can be used for people who have either a flexion or extension based injury. These exercises may resolve your symptoms with or without further involvement from your physician, physical therapist or athletic trainer. While completing these exercises, remember:  Muscles can gain both the endurance and the strength needed for everyday activities through controlled exercises. Complete these exercises as instructed by your physician, physical therapist or athletic trainer. Increase the resistance and repetitions only as guided. You may experience muscle soreness or fatigue, but the pain or discomfort you are trying to eliminate should never worsen during these exercises. If this pain does worsen, stop and make certain you are following the directions exactly. If the pain is still present after adjustments, discontinue the exercise until you can discuss the trouble with your caregiver.  STRENGTHENING - Deep Abdominals, Pelvic Tilt  Lie on a firm bed or floor. Keeping your legs in front of you, bend your knees  so they are both pointed toward the ceiling and your feet are flat on the floor. Tense your lower abdominal muscles to press your low back into the floor. This motion will rotate your pelvis so that your tail bone is scooping upwards rather than pointing at your feet or into the floor. With a gentle tension and even breathing, hold this position for 10-15 seconds. Repeat 2 times. Complete this exercise 1 time per day.   STRENGTHENING - Abdominals, Crunches  Lie on a firm bed or floor. Keeping your legs in front of you, bend your knees so they are both pointed toward the ceiling and your feet are flat on the floor. Cross your arms over your chest. Slightly tip your chin down without bending your neck. Tense your abdominals and slowly lift your trunk high  enough to just clear your shoulder blades. Lifting higher can put excessive stress on the lower back and does not further strengthen your abdominal muscles. Control your return to the starting position. Repeat 2 times. Complete this exercise once every 1-2 days.   STRENGTHENING - Quadruped, Opposite UE/LE Lift  Assume a hands and knees position on a firm surface. Keep your hands under your shoulders and your knees under your hips. You may place padding under your knees for comfort. Find your neutral spine and gently tense your abdominal muscles so that you can maintain this position. Your shoulders and hips should form a rectangle that is parallel with the floor and is not twisted. Keeping your trunk steady, lift your right hand no higher than your shoulder and then your left leg no higher than your hip. Make sure you are not holding your breath. Hold this position for 15-20 seconds. Continuing to keep your abdominal muscles tense and your back steady, slowly return to your starting position. Repeat with the opposite arm and leg. Repeat 2 times. Complete this exercise once every 1-2 days.   STRENGTHENING - Abdominals and Quadriceps, Straight Leg Raise  Lie on a firm bed or floor with both legs extended in front of you. Keeping one leg in contact with the floor, bend the other knee so that your foot can rest flat on the floor. Find your neutral spine, and tense your abdominal muscles to maintain your spinal position throughout the exercise. Slowly lift your straight leg off the floor about 6 inches for a count of 15, making sure to not hold your breath. Still keeping your neutral spine, slowly lower your leg all the way to the floor. Repeat this exercise with each leg 2 times. Complete this exercise once every 1-2 days. POSTURE AND BODY MECHANICS CONSIDERATIONS - Low Back Sprain Keeping correct posture when sitting, standing or completing your activities will reduce the stress put on different body  tissues, allowing injured tissues a chance to heal and limiting painful experiences. The following are general guidelines for improved posture. Your physician or physical therapist will provide you with any instructions specific to your needs. While reading these guidelines, remember: The exercises prescribed by your provider will help you have the flexibility and strength to maintain correct postures. The correct posture provides the best environment for your joints to work. All of your joints have less wear and tear when properly supported by a spine with good posture. This means you will experience a healthier, less painful body. Correct posture must be practiced with all of your activities, especially prolonged sitting and standing. Correct posture is as important when doing repetitive low-stress activities (typing) as  it is when doing a single heavy-load activity (lifting).  RESTING POSITIONS Consider which positions are most painful for you when choosing a resting position. If you have pain with flexion-based activities (sitting, bending, stooping, squatting), choose a position that allows you to rest in a less flexed posture. You would want to avoid curling into a fetal position on your side. If your pain worsens with extension-based activities (prolonged standing, working overhead), avoid resting in an extended position such as sleeping on your stomach. Most people will find more comfort when they rest with their spine in a more neutral position, neither too rounded nor too arched. Lying on a non-sagging bed on your side with a pillow between your knees, or on your back with a pillow under your knees will often provide some relief. Keep in mind, being in any one position for a prolonged period of time, no matter how correct your posture, can still lead to stiffness. PROPER SITTING POSTURE In order to minimize stress and discomfort on your spine, you must sit with correct posture. Sitting with good  posture should be effortless for a healthy body. Returning to good posture is a gradual process. Many people can work toward this most comfortably by using various supports until they have the flexibility and strength to maintain this posture on their own. When sitting with proper posture, your ears will fall over your shoulders and your shoulders will fall over your hips. You should use the back of the chair to support your upper back. Your lower back will be in a neutral position, just slightly arched. You may place a small pillow or folded towel at the base of your lower back for  support.  When working at a desk, create an environment that supports good, upright posture. Without extra support, muscles tire, which leads to excessive strain on joints and other tissues. Keep these recommendations in mind:  CHAIR: A chair should be able to slide under your desk when your back makes contact with the back of the chair. This allows you to work closely. The chair's height should allow your eyes to be level with the upper part of your monitor and your hands to be slightly lower than your elbows.  BODY POSITION Your feet should make contact with the floor. If this is not possible, use a foot rest. Keep your ears over your shoulders. This will reduce stress on your neck and low back.  INCORRECT SITTING POSTURES  If you are feeling tired and unable to assume a healthy sitting posture, do not slouch or slump. This puts excessive strain on your back tissues, causing more damage and pain. Healthier options include: Using more support, like a lumbar pillow. Switching tasks to something that requires you to be upright or walking. Talking a brief walk. Lying down to rest in a neutral-spine position.  PROLONGED STANDING WHILE SLIGHTLY LEANING FORWARD  When completing a task that requires you to lean forward while standing in one place for a long time, place either foot up on a stationary 2-4 inch high object  to help maintain the best posture. When both feet are on the ground, the lower back tends to lose its slight inward curve. If this curve flattens (or becomes too large), then the back and your other joints will experience too much stress, tire more quickly, and can cause pain.  CORRECT STANDING POSTURES Proper standing posture should be assumed with all daily activities, even if they only take a few moments, like when  brushing your teeth. As in sitting, your ears should fall over your shoulders and your shoulders should fall over your hips. You should keep a slight tension in your abdominal muscles to brace your spine. Your tailbone should point down to the ground, not behind your body, resulting in an over-extended swayback posture.   INCORRECT STANDING POSTURES  Common incorrect standing postures include a forward head, locked knees and/or an excessive swayback. WALKING Walk with an upright posture. Your ears, shoulders and hips should all line-up.  PROLONGED ACTIVITY IN A FLEXED POSITION When completing a task that requires you to bend forward at your waist or lean over a low surface, try to find a way to stabilize 3 out of 4 of your limbs. You can place a hand or elbow on your thigh or rest a knee on the surface you are reaching across. This will provide you more stability, so that your muscles do not tire as quickly. By keeping your knees relaxed, or slightly bent, you will also reduce stress across your lower back. CORRECT LIFTING TECHNIQUES  DO : Assume a wide stance. This will provide you more stability and the opportunity to get as close as possible to the object which you are lifting. Tense your abdominals to brace your spine. Bend at the knees and hips. Keeping your back locked in a neutral-spine position, lift using your leg muscles. Lift with your legs, keeping your back straight. Test the weight of unknown objects before attempting to lift them. Try to keep your elbows locked down at  your sides in order get the best strength from your shoulders when carrying an object.   Always ask for help when lifting heavy or awkward objects. INCORRECT LIFTING TECHNIQUES DO NOT:  Lock your knees when lifting, even if it is a small object. Bend and twist. Pivot at your feet or move your feet when needing to change directions. Assume that you can safely pick up even a paperclip without proper posture. Heat (pad or rice pillow in microwave) over affected area, 10-15 minutes every 2-3 hours while awake.  OK to take Tylenol 1000 mg (2 extra strength tabs) or 975 mg (3 regular strength tabs) every 6 hours as needed. Ibuprofen 400-600 mg (2-3 over the counter strength tabs) every 6 hours as needed for pain.

## 2016-08-26 NOTE — ED Triage Notes (Signed)
Patient reports n/v/d over the past couple days. Had one loose. Watery stool in past 24 hours. Vomited 3 times in past 2 days.

## 2016-08-26 NOTE — ED Notes (Signed)
Dirty and clean urine collected. 

## 2016-09-03 ENCOUNTER — Emergency Department (HOSPITAL_COMMUNITY): Payer: Self-pay

## 2016-09-03 ENCOUNTER — Encounter (HOSPITAL_COMMUNITY): Payer: Self-pay | Admitting: Emergency Medicine

## 2016-09-03 ENCOUNTER — Emergency Department (HOSPITAL_COMMUNITY)
Admission: EM | Admit: 2016-09-03 | Discharge: 2016-09-03 | Disposition: A | Discharge status: Home / Self Care | Payer: Self-pay | Attending: Emergency Medicine | Admitting: Emergency Medicine

## 2016-09-03 DIAGNOSIS — Z87891 Personal history of nicotine dependence: Secondary | ICD-10-CM | POA: Insufficient documentation

## 2016-09-03 DIAGNOSIS — I10 Essential (primary) hypertension: Secondary | ICD-10-CM | POA: Insufficient documentation

## 2016-09-03 DIAGNOSIS — M545 Low back pain: Secondary | ICD-10-CM | POA: Insufficient documentation

## 2016-09-03 DIAGNOSIS — G8929 Other chronic pain: Secondary | ICD-10-CM

## 2016-09-03 DIAGNOSIS — J45909 Unspecified asthma, uncomplicated: Secondary | ICD-10-CM | POA: Insufficient documentation

## 2016-09-03 DIAGNOSIS — Z79899 Other long term (current) drug therapy: Secondary | ICD-10-CM | POA: Insufficient documentation

## 2016-09-03 DIAGNOSIS — R0789 Other chest pain: Secondary | ICD-10-CM | POA: Insufficient documentation

## 2016-09-03 MED ORDER — DIAZEPAM 5 MG PO TABS
5.0000 mg | ORAL_TABLET | Freq: Once | ORAL | Status: AC
  Administered 2016-09-03: 5 mg via ORAL
  Filled 2016-09-03: qty 1

## 2016-09-03 MED ORDER — METHOCARBAMOL 750 MG PO TABS
750.0000 mg | ORAL_TABLET | Freq: Four times a day (QID) | ORAL | 0 refills | Status: DC
Start: 2016-09-03 — End: 2016-09-18

## 2016-09-03 NOTE — ED Triage Notes (Signed)
patient c/o central chest pain x 2 days. Patient also c/o right lower back pain x couples months that "is messing up my walking on my right leg".

## 2016-09-03 NOTE — ED Notes (Signed)
Pt reports that he has been having right side hip pain that that radiates up his right side back and down his right leg  Shooting pain 10/10 tht worsens with movement persistent x 1 month. Pt also reports chest pain he describes as " mild"  8/10 aches and states " I feel like I slept in the wrong position " discomfort stated 2 days ago.

## 2016-09-03 NOTE — ED Provider Notes (Signed)
[facility]-EMERGENCY DEPT Provider Note   CSN: [id number] Arrival date & time: 09/03/16  1017     History   Chief Complaint Chief Complaint  Patient presents with  . Chest Pain  . Back Pain    HPI Walter Howell is a 31 y.o. male.  31 year old male presents with left parasternal sharp chest pain is worse with movement. Patient states he slept funny and pain is worse when he extends both of his arms behind his head. No associated diaphoresis, dyspnea, nausea. Pain is better with remaining still. No cough or congestion. Also notes chronic worsening right flank and back pain which is half or several months without radicular symptoms or bowel or bladder dysfunction. The pain is also worse with movement and better with remaining still. Denies any urinary symptoms. No treatment use prior to arrival.      Past Medical History:  Diagnosis Date  . Anxiety   . Asthma   . GERD (gastroesophageal reflux disease)   . Hypertension     Patient Active Problem List   Diagnosis Date Noted  . Anxiety and depression 12/06/2015  . Chronic bilateral low back pain without sciatica 12/06/2015  . Essential hypertension, benign 07/08/2013  . Dental cavities 07/08/2013  . Pain, dental 07/08/2013    History reviewed. No pertinent surgical history.     Home Medications    Prior to Admission medications   Medication Sig Start Date End Date Taking? Authorizing Provider  amLODipine (NORVASC) 10 MG tablet Take 1 tablet (10 mg total) by mouth daily. 12/06/15   Funches, Gerilyn NestleJosalyn, MD  cetirizine (ZYRTEC) 10 MG tablet Take 10 mg by mouth daily.    [provider]  cyclobenzaprine (FLEXERIL) 10 MG tablet Take 1 tablet (10 mg total) by mouth 2 (two) times daily as needed for muscle spasms. 08/26/16   Sharlene DoryWendling, Nicholas Paul, DO  DULoxetine (CYMBALTA) 20 MG capsule Take 1 capsule (20 mg total) by mouth daily. 12/06/15   Funches, Gerilyn NestleJosalyn, MD  ondansetron (ZOFRAN) 4 MG tablet Take 1 tablet (4 mg  total) by mouth every 6 (six) hours. 08/26/16   Sharlene DoryWendling, Nicholas Paul, DO    Family History Family History  Problem Relation Age of Onset  . Diabetes Maternal Grandmother   . Hypertension Maternal Grandmother   . Diabetes Maternal Grandfather   . Diabetes Paternal Grandmother   . Heart disease Paternal Grandmother   . Diabetes Paternal Grandfather     Social History Social History  Substance Use Topics  . Smoking status: Former Games developermoker  . Smokeless tobacco: Never Used  . Alcohol use No     Allergies   Prednisone   Review of Systems Review of Systems  All other systems reviewed and are negative.    Physical Exam Updated Vital Signs BP (!) 151/107   Pulse 75   Temp 97.9 F (36.6 C) (Oral)   Resp 18   Ht 1.803 m (5\' 11" )   Wt 107.5 kg (237 lb)   SpO2 99%   BMI 33.05 kg/m   Physical Exam  Constitutional: He is oriented to person, place, and time. He appears well-developed and well-nourished.  Non-toxic appearance. No distress.  HENT:  Head: Normocephalic and atraumatic.  Eyes: Conjunctivae, EOM and lids are normal. Pupils are equal, round, and reactive to light.  Neck: Normal range of motion. Neck supple. No tracheal deviation present. No thyroid mass present.  Cardiovascular: Normal rate, regular rhythm and normal heart sounds.  Exam reveals no gallop.   No murmur  heard. Pulmonary/Chest: Effort normal and breath sounds normal. No stridor. No respiratory distress. He has no decreased breath sounds. He has no wheezes. He has no rhonchi. He has no rales. He exhibits tenderness. He exhibits no crepitus.    Abdominal: Soft. Normal appearance and bowel sounds are normal. He exhibits no distension. There is no tenderness. There is no rebound and no CVA tenderness.  Musculoskeletal: Normal range of motion. He exhibits no edema or tenderness.       Back:  Neurological: He is alert and oriented to person, place, and time. He has normal strength. No cranial nerve deficit  or sensory deficit. GCS eye subscore is 4. GCS verbal subscore is 5. GCS motor subscore is 6.  Skin: Skin is warm and dry. No abrasion and no rash noted.  Psychiatric: He has a normal mood and affect. His speech is normal and behavior is normal.  Nursing note and vitals reviewed.    ED Treatments / Results  Labs (all labs ordered are listed, but only abnormal results are displayed) Labs Reviewed  BASIC METABOLIC PANEL  CBC  I-STAT TROPOININ, ED    EKG  EKG Interpretation  Date/Time:  Monday September 03 2016 10:25:30 EDT Ventricular Rate:  64 PR Interval:    QRS Duration: 75 QT Interval:  358 QTC Calculation: 370 R Axis:   56 Text Interpretation:  Sinus rhythm ST elev, probable normal early repol pattern Baseline wander in lead(s) I No significant change since last tracing Confirmed by Lorre Nick ([id number]) on 09/03/2016 11:35:56 AM       Radiology Dg Chest 2 View  Result Date: 09/03/2016 CLINICAL DATA:  Mid chest pain, shortness of Breath EXAM: CHEST  2 VIEW COMPARISON:  03/04/2016 FINDINGS: Heart and mediastinal contours are within normal limits. No focal opacities or effusions. No acute bony abnormality. IMPRESSION: No active cardiopulmonary disease. Electronically Signed   By: Charlett Nose M.D.   On: 09/03/2016 10:57    Procedures Procedures (including critical care time)  Medications Ordered in ED Medications  diazepam (VALIUM) tablet 5 mg (not administered)     Initial Impression / Assessment and Plan / ED Course  I have reviewed the triage vital signs and the nursing notes.  Pertinent labs & imaging results that were available during my care of the patient were reviewed by me and considered in my medical decision making (see chart for details).     Patient treated with Valium and feels better. No evidence of fracture on his x-rays. Stable for discharge  Final Clinical Impressions(s) / ED Diagnoses   Final diagnoses:  None    New Prescriptions New  Prescriptions   No medications on file     Lorre Nick, MD 09/03/16 1307

## 2016-09-18 ENCOUNTER — Encounter: Payer: Self-pay | Admitting: Family Medicine

## 2016-09-18 ENCOUNTER — Ambulatory Visit: Payer: Self-pay | Attending: Family Medicine | Admitting: Family Medicine

## 2016-09-18 VITALS — BP 166/89 | HR 73 | Temp 98.2°F | Ht 71.0 in | Wt 262.8 lb

## 2016-09-18 DIAGNOSIS — G8929 Other chronic pain: Secondary | ICD-10-CM | POA: Insufficient documentation

## 2016-09-18 DIAGNOSIS — Z87891 Personal history of nicotine dependence: Secondary | ICD-10-CM | POA: Insufficient documentation

## 2016-09-18 DIAGNOSIS — M545 Low back pain, unspecified: Secondary | ICD-10-CM

## 2016-09-18 DIAGNOSIS — Z79899 Other long term (current) drug therapy: Secondary | ICD-10-CM | POA: Insufficient documentation

## 2016-09-18 DIAGNOSIS — I10 Essential (primary) hypertension: Secondary | ICD-10-CM | POA: Insufficient documentation

## 2016-09-18 DIAGNOSIS — Z113 Encounter for screening for infections with a predominantly sexual mode of transmission: Secondary | ICD-10-CM

## 2016-09-18 MED ORDER — DICLOFENAC SODIUM 75 MG PO TBEC: 75.0000 mg | DELAYED_RELEASE_TABLET | Freq: Two times a day (BID) | ORAL | 0 refills | Status: DC

## 2016-09-18 MED ORDER — CHLORTHALIDONE 25 MG PO TABS: 25.0000 mg | ORAL_TABLET | Freq: Every day | ORAL | 5 refills | Status: DC

## 2016-09-18 MED FILL — ?CHLORTHALIDONE 25 MG TABLE: 25 | 30 days supply | Qty: 30 | Fill #0

## 2016-09-18 MED FILL — ?DICLOFENAC SOD DR 75 MG TA: 75 | 30 days supply | Qty: 60 | Fill #0

## 2016-09-18 NOTE — Progress Notes (Signed)
Subjective:  Patient ID: Walter Howell, male    DOB: 1985/08/23  Age: 31 y.o. MRN: [id number]  CC: Back Pain   HPI Walter Howell has HTN, presents for   1. HTN: taking Norvasc 10 mg daily. Denies high salt diet. No exercise. Gaining weight. No HA, CP SOB or leg swelling.   2. Back pain: R sided pain. Radiates up back and down R leg. Pain is exacerbated by prolonged standing. He has to favor the left side. He reports 2 episode of urinary incontinence. He denies fecal incontinence. He reports numbness in his R leg.  No recent trauma. He has been to   3. STI testing: screening requested. No discharge from penis or genital sores. He has sex with men. Reports consistent use of condoms.   Social History  Substance Use Topics  . Smoking status: Former Games developer  . Smokeless tobacco: Never Used  . Alcohol use No    Outpatient Medications Prior to Visit  Medication Sig Dispense Refill  . acetaminophen (TYLENOL) 500 MG tablet Take 1,000 mg by mouth every 6 (six) hours as needed for mild pain, moderate pain, fever or headache.    Marland Kitchen amLODipine (NORVASC) 10 MG tablet Take 1 tablet (10 mg total) by mouth daily. 30 tablet 11  . cetirizine (ZYRTEC) 10 MG tablet Take 10 mg by mouth daily.    . cyclobenzaprine (FLEXERIL) 10 MG tablet Take 1 tablet (10 mg total) by mouth 2 (two) times daily as needed for muscle spasms. (Patient not taking: Reported on 09/03/2016) 14 tablet 0  . DULoxetine (CYMBALTA) 20 MG capsule Take 1 capsule (20 mg total) by mouth daily. 30 capsule 2  . methocarbamol (ROBAXIN-750) 750 MG tablet Take 1 tablet (750 mg total) by mouth 4 (four) times daily. 30 tablet 0  . Multiple Vitamin (MULTIVITAMIN WITH MINERALS) TABS tablet Take 1 tablet by mouth daily.    . ondansetron (ZOFRAN) 4 MG tablet Take 1 tablet (4 mg total) by mouth every 6 (six) hours. (Patient not taking: Reported on 09/03/2016) 12 tablet 0   No facility-administered medications prior to visit.      ROS Review of Systems  Constitutional: Negative for chills, fatigue, fever and unexpected weight change.  Eyes: Negative for visual disturbance.  Respiratory: Negative for cough and shortness of breath.   Cardiovascular: Negative for chest pain, palpitations and leg swelling.  Gastrointestinal: Negative for abdominal pain, blood in stool, constipation, diarrhea, nausea and vomiting.  Endocrine: Negative for polydipsia, polyphagia and polyuria.  Musculoskeletal: Positive for back pain. Negative for arthralgias, gait problem, myalgias and neck pain.  Skin: Negative for rash.  Allergic/Immunologic: Negative for immunocompromised state.  Hematological: Negative for adenopathy. Does not bruise/bleed easily.  Psychiatric/Behavioral: Positive for dysphoric mood and sleep disturbance. Negative for suicidal ideas. The patient is nervous/anxious.     Objective:  BP (!) 166/89   Pulse 73   Temp 98.2 F (36.8 C) (Oral)   Ht 5\' 11"  (1.803 m)   Wt 262 lb 12.8 oz (119.2 kg)   SpO2 96%   BMI 36.65 kg/m   BP/Weight 09/18/2016 09/03/2016 08/26/2016  Systolic BP 166 133 166  Diastolic BP 89 88 89  Wt. (Lbs) 262.8 237 237  BMI 36.65 33.05 33.05   Wt Readings from Last 3 Encounters:  09/18/16 262 lb 12.8 oz (119.2 kg)  09/03/16 237 lb (107.5 kg)  08/26/16 237 lb (107.5 kg)    Physical Exam  Constitutional: He appears well-developed and well-nourished. No distress.  HENT:  Head: Normocephalic and atraumatic.  Neck: Normal range of motion. Neck supple.  Cardiovascular: Normal rate, regular rhythm, normal heart sounds and intact distal pulses.   Pulmonary/Chest: Effort normal and breath sounds normal.  Musculoskeletal: He exhibits no edema.  Back Exam: Back: Normal Curvature, no deformities or CVA tenderness  Paraspinal Tenderness: b/l lumbar   LE Strength 5/5  LE Sensation: in tact  LE Reflexes 2+ and symmetric  Straight leg raise: negative    Neurological: He is alert.  Skin: Skin  is warm and dry. No rash noted. No erythema.  Psychiatric: He has a normal mood and affect.   Lab Results  Component Value Date   HGBA1C 5.5 07/19/2014   Depression screen [facility] 2/9 12/06/2015 02/24/2015 04/14/2014  Decreased Interest 3 1 0  Down, Depressed, Hopeless 3 0 0  PHQ - 2 Score 6 1 0   GAD 7 : Generalized Anxiety Score 02/24/2015  Nervous, Anxious, on Edge 3  Control/stop worrying 3  Worry too much - different things 3  Trouble relaxing 0  Restless 0  Easily annoyed or irritable 0  Afraid - awful might happen 0  Total GAD 7 Score 9     Assessment & Plan:  Walter JunesBrandon was seen today for back pain.  Diagnoses and all orders for this visit:  Chronic bilateral low back pain without sciatica -     diclofenac (VOLTAREN) 75 MG EC tablet; Take 1 tablet (75 mg total) by mouth 2 (two) times daily. -     Ambulatory referral to Physical Therapy  Essential hypertension, benign -     chlorthalidone (HYGROTON) 25 MG tablet; Take 1 tablet (25 mg total) by mouth daily.  Routine screening for STI (sexually transmitted infection) -     HIV antibody (with reflex) -     RPR -     Urine cytology ancillary only   There are no diagnoses linked to this encounter.  No orders of the defined types were placed in this encounter.   Follow-up: Return in about 6 weeks (around 10/30/2016) for back pain and BP check.   Dessa PhiJosalyn Veleka Djordjevic MD

## 2016-09-18 NOTE — Patient Instructions (Addendum)
Walter Howell was seen today for back pain.  Diagnoses and all orders for this visit:  Chronic bilateral low back pain without sciatica -     diclofenac (VOLTAREN) 75 MG EC tablet; Take 1 tablet (75 mg total) by mouth 2 (two) times daily. -     Ambulatory referral to Physical Therapy  Essential hypertension, benign -     chlorthalidone (HYGROTON) 25 MG tablet; Take 1 tablet (25 mg total) by mouth daily.   Stop norvasc 10 mg daily Start chlorthalidone 25 mg daily  Please apply for [place] discount and orange card, you can also inquire if any of your medications are on the PASS (medications assistance) list.   F/u in 6 weeks back pain and BP check   Dr. Armen PickupFunches   Sacroiliac Joint Dysfunction Sacroiliac joint dysfunction is a condition that causes inflammation on one or both sides of the sacroiliac (SI) joint. The SI joint connects the lower part of the spine (sacrum) with the two upper portions of the pelvis (ilium). This condition causes deep aching or burning pain in the low back. In some cases, the pain may also spread into one or both buttocks or hips or spread down the legs. What are the causes? This condition may be caused by:  Pregnancy. During pregnancy, extra stress is put on the SI joints because the pelvis widens.  Injury, such as: ? Car accidents. ? Sport-related injuries. ? Work-related injuries.  Having one leg that is shorter than the other.  Conditions that affect the joints, such as: ? Rheumatoid arthritis. ? Gout. ? Psoriatic arthritis. ? Joint infection (septic arthritis).  Sometimes, the cause of SI joint dysfunction is not known. What are the signs or symptoms? Symptoms of this condition include:  Aching or burning pain in the lower back. The pain may also spread to other areas, such as: ? Buttocks. ? Groin. ? Thighs and legs.  Muscle spasms in or around the painful areas.  Increased pain when standing, walking, running, stair climbing, bending, or  lifting.  How is this diagnosed? Your health care provider will do a physical exam and take your medical history. During the exam, the health care provider may move one or both of your legs to different positions to check for pain. Various tests may be done to help verify the diagnosis, including:  Imaging tests to look for other causes of pain. These may include: ? MRI. ? CT scan. ? Bone scan.  Diagnostic injection. A numbing medicine is injected into the SI joint using a needle. If the pain is temporarily improved or stopped after the injection, this can indicate that SI joint dysfunction is the problem.  How is this treated? Treatment may vary depending on the cause and severity of your condition. Treatment options may include:  Applying ice or heat to the lower back area. This can help to reduce pain and muscle spasms.  Medicines to relieve pain or inflammation or to relax the muscles.  Wearing a back brace (sacroiliac brace) to help support the joint while your back is healing.  Physical therapy to increase muscle strength around the joint and flexibility at the joint. This may also involve learning proper body positions and ways of moving to relieve stress on the joint.  Direct manipulation of the SI joint.  Injections of steroid medicine into the joint in order to reduce pain and swelling.  Radiofrequency ablation to burn away nerves that are carrying pain messages from the joint.  Use of  a device that provides electrical stimulation in order to reduce pain at the joint.  Surgery to put in screws and plates that limit or prevent joint motion. This is rare.  Follow these instructions at home:  Rest as needed. Limit your activities as directed by your health care provider.  Take medicines only as directed by your health care provider.  If directed, apply ice to the affected area: ? Put ice in a plastic bag. ? Place a towel between your skin and the bag. ? Leave the ice  on for 20 minutes, 2-3 times per day.  Use a heating pad or a moist heat pack as directed by your health care provider.  Exercise as directed by your health care provider or physical therapist.  Keep all follow-up visits as directed by your health care provider. This is important. Contact a health care provider if:  Your pain is not controlled with medicine.  You have a fever.  You have increasingly severe pain. Get help right away if:  You have weakness, numbness, or tingling in your legs or feet.  You lose control of your bladder or bowel. This information is not intended to replace advice given to you by your health care provider. Make sure you discuss any questions you have with your health care provider. Document Released: 05/11/2008 Document Revised: 07/21/2015 Document Reviewed: 10/20/2013 Elsevier Interactive Patient Education  [employer].

## 2016-09-19 ENCOUNTER — Telehealth: Payer: Self-pay

## 2016-09-19 LAB — HIV ANTIBODY (ROUTINE TESTING W REFLEX): HIV Screen 4th Generation wRfx: NONREACTIVE

## 2016-09-19 LAB — RPR: RPR: NONREACTIVE

## 2016-09-19 NOTE — Telephone Encounter (Signed)
Pt was called and informed of lab results. 

## 2016-09-20 LAB — URINE CYTOLOGY ANCILLARY ONLY
Chlamydia: NEGATIVE
Neisseria Gonorrhea: NEGATIVE
Trichomonas: NEGATIVE

## 2016-09-20 NOTE — Assessment & Plan Note (Signed)
Suspect sacroiliitis Plan: voltaren PT He is uninsured advised her apply for orange card and [place] discount for PT and incase he needs advance imaging like MRI if symptoms fail to improve or needs to see ortho

## 2016-09-20 NOTE — Assessment & Plan Note (Signed)
Uncontrolled Change for Norvasc 10 mg to chlorthalidone 25 mg daily He may need both Low salt diet Work on weight reduction

## 2016-09-21 ENCOUNTER — Telehealth: Payer: Self-pay

## 2016-09-21 NOTE — Telephone Encounter (Signed)
Pt was called and informed for lab results.

## 2016-10-01 ENCOUNTER — Ambulatory Visit: Payer: Self-pay | Attending: Family Medicine | Admitting: Physical Therapy

## 2016-10-01 ENCOUNTER — Encounter: Payer: Self-pay | Admitting: Physical Therapy

## 2016-10-01 DIAGNOSIS — M545 Low back pain: Secondary | ICD-10-CM | POA: Insufficient documentation

## 2016-10-01 DIAGNOSIS — G8929 Other chronic pain: Secondary | ICD-10-CM | POA: Insufficient documentation

## 2016-10-01 NOTE — Therapy (Signed)
[facility] [address] [place], [place], [address] Phone: [phone]   Fax:  [phone]  Physical Therapy Treatment  Patient Details  Name: Walter Howell MRN: [id number] Date of Birth: 16-Nov-1985 Referring Provider: Dessa PhiFunches, Josalyn, MD  Encounter Date: 10/01/2016      PT End of Session - 10/01/16 0823    Visit Number 1   Number of Visits 9   Date for PT Re-Evaluation 11/02/16   Authorization Type self pay   PT Start Time 0820  pt arrived late   PT Stop Time 0846   PT Time Calculation (min) 26 min   Activity Tolerance Patient tolerated treatment well   Behavior [facility] Therapy [facility] for tasks assessed/performed      Past Medical History:  Diagnosis Date  . Anxiety   . Asthma   . GERD (gastroesophageal reflux disease)   . Hypertension     History reviewed. No pertinent surgical history.  There were no vitals filed for this visit.      Subjective Assessment - 10/01/16 0827    Subjective Pain began "some months" ago. R sided LBP sharp pains that went in and out. Pain shooting down post aspect of leg that has been constant.    How long can you sit comfortably? occasionally with decrease pain   How long can you stand comfortably? unable   Currently in Pain? Yes   Pain Score 8    Pain Location Back   Pain Orientation Right;Lower   Pain Descriptors / Indicators Sharp   Aggravating Factors  standing   Pain Relieving Factors laying flat            [facility] PT Assessment - 10/01/16 0001      Assessment   Medical Diagnosis LBP   Referring Provider Dessa PhiFunches, Josalyn, MD   Onset Date/Surgical Date --  a few months   Hand Dominance Right   Next MD Visit 2 weeks   Prior Therapy no     Precautions   Precautions None     Restrictions   Weight Bearing Restrictions No     Balance Screen   Has the patient fallen in the past 6 months No     Home Environment   Living Environment Private residence    Additional Comments no stairs     Prior Function   Level of Independence Independent   Vocation Requirements [place] [employer]     Cognition   Overall Cognitive Status Within Functional Limits for tasks assessed     Sensation   Additional Comments burning post R leg, tingling lateral r thigh     ROM / Strength   AROM / PROM / Strength AROM;Strength     AROM   Overall AROM Comments R (20 deg) HS significant limitation v L (50 deg)   AROM Assessment Site Lumbar   Lumbar Flexion 15 deg  pain   Lumbar Extension 6  pain   Lumbar - Right Side Bend 6   Lumbar - Left Side Bend 0     Strength   Strength Assessment Site Hip   Right/Left Hip Right;Left   Right Hip Extension 4+/5   Right Hip ABduction 4-/5   Left Hip Extension 3+/5   Left Hip ABduction 4/5                             PT Education - 10/01/16 1001    Education provided Yes  Education Details anatomy of condition, POC, HEP, exercise form/rationale   Person(s) Educated Patient   Methods Explanation;Demonstration;Tactile cues;Verbal cues;Handout   Comprehension Verbalized understanding;Verbal cues required;Returned demonstration;Tactile cues required;Need further instruction             PT Long Term Goals - 10/01/16 0948      PT LONG TERM GOAL #1   Title Pt will be able to stand for at least 1 hour without increased pain in SIJ   Baseline pain upon standing at eval   Time 4   Period Weeks   Status New   Target Date 11/02/16     PT LONG TERM GOAL #2   Title Hip abduction strength to 5/5 bilat for stability to lumbopelvic region   Baseline L4-/5  R 4/5   Time 4   Period Weeks   Status New   Target Date 11/02/16     PT LONG TERM GOAL #3   Title Pt will be able to complete work activities without limitation by back/leg pain   Baseline limited by ability to walk or stand for extended periods   Time 4   Period Weeks   Status New   Target Date 11/02/16     PT LONG TERM GOAL #4    Title Pt will be indepedent with HEP and comfortable with proper resting postures for long term care   Baseline will establish and progress as appropriate   Time 4   Period Weeks   Status New   Target Date 11/02/16               Plan - 10/01/16 1914    Clinical Impression Statement Pt presents to PT with complaints of R sided LBP that extends down the back of his R leg, began a few months ago. Concordant pain upon palpation to R SIJ with limited hamstring flexibility on R side indicate anterior inominate rotation. Poor hip abductor strength and core activation notable. Pt will benefit from skilled PT to improve lumbopelvic stability and meet long term functional goals.    History and Personal Factors relevant to plan of care: anxiety & depression, HTN, chronic LBP   Clinical Presentation Evolving   Clinical Presentation due to: pain peripheralizing   Clinical Decision Making Moderate   Rehab Potential Good   PT Frequency 2x / week   PT Duration 4 weeks   PT Treatment/Interventions ADLs/Self Care Home Management;Cryotherapy;Electrical Stimulation;Iontophoresis 4mg /ml Dexamethasone;Functional mobility training;Stair training;Gait training;Ultrasound;Traction;Moist Heat;Therapeutic activities;Therapeutic exercise;Balance training;Neuromuscular re-education;Patient/family education;Passive range of motion;Dry needling;Taping   PT Next Visit Plan IASTM around SIJ, lumbopelvic stability   PT Home Exercise Plan hooklying or seated pillow squeeze & clam blue tband   Consulted and Agree with Plan of Care Patient      Patient will benefit from skilled therapeutic intervention in order to improve the following deficits and impairments:  Decreased range of motion, Difficulty walking, Increased muscle spasms, Decreased activity tolerance, Pain, Improper body mechanics, Impaired flexibility, Decreased mobility, Decreased strength, Impaired sensation, Postural dysfunction  Visit  Diagnosis: Chronic right-sided low back pain without sciatica - Plan: PT plan of care cert/re-cert     Problem List Patient Active Problem List   Diagnosis Date Noted  . Anxiety and depression 12/06/2015  . Chronic bilateral low back pain without sciatica 12/06/2015  . Essential hypertension, benign 07/08/2013    Walter Howell PT, DPT 10/01/16 10:03 AM   [facility] [address] [place], [place], [address] Phone: [phone]  Fax:  [phone]  Name: Walter Howell MRN: [id number] Date of Birth: March 16, 1985

## 2016-10-01 NOTE — Patient Instructions (Signed)
  Stand equal on feet.

## 2016-10-03 ENCOUNTER — Encounter: Payer: Self-pay | Admitting: Emergency Medicine

## 2016-10-03 ENCOUNTER — Emergency Department
Admission: EM | Admit: 2016-10-03 | Discharge: 2016-10-03 | Disposition: A | Discharge status: Home / Self Care | Payer: Self-pay | Source: Home / Self Care | Attending: Family Medicine | Admitting: Family Medicine

## 2016-10-03 DIAGNOSIS — M7061 Trochanteric bursitis, right hip: Secondary | ICD-10-CM

## 2016-10-03 DIAGNOSIS — M533 Sacrococcygeal disorders, not elsewhere classified: Secondary | ICD-10-CM

## 2016-10-03 MED ORDER — DEXAMETHASONE 1.5 MG PO TABS: ORAL_TABLET | ORAL | 0 refills | Status: DC

## 2016-10-03 NOTE — ED Triage Notes (Signed)
Right side sciatica x 1 month worse since yesterday. [facility] some little white pills that aren't helping. Tried ibuprofen and Tylenol with no relief.

## 2016-10-03 NOTE — ED Provider Notes (Signed)
[facility]    CSN: [id number] Arrival date & time: 10/03/16  1717     History   Chief Complaint Chief Complaint  Patient presents with  . Sciatica    HPI Walter Howell is a 31 y.o. male.   Patient complains of 1.5 month history of pain in his right lower back radiating into his right hip area, worse at night.  He recalls no recent injury or change in activities.  He has had similar pain in the past.   He denies bowel or bladder dysfunction, and no saddle numbness.   He states that he has an intolerance to prednisone.   The history is provided by the patient.  Back Pain  Location:  Lumbar spine and sacro-iliac joint Quality:  Aching Radiates to: right hip. Pain severity:  Moderate Pain is:  Worse during the night Onset quality:  Gradual Duration:  6 weeks Timing:  Constant Progression:  Unchanged Chronicity:  Recurrent Context: not recent injury   Relieved by:  Nothing Worsened by:  Bending and ambulation Ineffective treatments:  NSAIDs Associated symptoms: no abdominal pain, no bladder incontinence, no bowel incontinence, no chest pain, no dysuria, no fever, no leg pain, no numbness, no paresthesias, no perianal numbness, no tingling, no weakness and no weight loss     Past Medical History:  Diagnosis Date  . Anxiety   . Asthma   . GERD (gastroesophageal reflux disease)   . Hypertension     Patient Active Problem List   Diagnosis Date Noted  . Anxiety and depression 12/06/2015  . Chronic bilateral low back pain without sciatica 12/06/2015  . Essential hypertension, benign 07/08/2013    History reviewed. No pertinent surgical history.     Home Medications    Prior to Admission medications   Medication Sig Start Date End Date Taking? Authorizing Provider  acetaminophen (TYLENOL) 500 MG tablet Take 1,000 mg by mouth every 6 (six) hours as needed for mild pain, moderate pain, fever or headache.    [provider]  amLODipine  (NORVASC) 10 MG tablet Take 1 tablet (10 mg total) by mouth daily. 12/06/15   Funches, Gerilyn NestleJosalyn, MD  cetirizine (ZYRTEC) 10 MG tablet Take 10 mg by mouth daily.    [provider]  chlorthalidone (HYGROTON) 25 MG tablet Take 1 tablet (25 mg total) by mouth daily. 09/18/16   Funches, Gerilyn NestleJosalyn, MD  cyclobenzaprine (FLEXERIL) 10 MG tablet Take 1 tablet (10 mg total) by mouth 2 (two) times daily as needed for muscle spasms. 08/26/16   Sharlene DoryWendling, Nicholas Paul, DO  dexamethasone (DECADRON) 1.5 MG tablet Take by mouth as directed. 10/03/16   Lattie HawBeese, Stephen A, MD  diclofenac (VOLTAREN) 75 MG EC tablet Take 1 tablet (75 mg total) by mouth 2 (two) times daily. 09/18/16   Funches, Gerilyn NestleJosalyn, MD  DULoxetine (CYMBALTA) 20 MG capsule Take 1 capsule (20 mg total) by mouth daily. 12/06/15   Funches, Gerilyn NestleJosalyn, MD  Multiple Vitamin (MULTIVITAMIN WITH MINERALS) TABS tablet Take 1 tablet by mouth daily.    [provider]  ondansetron (ZOFRAN) 4 MG tablet Take 1 tablet (4 mg total) by mouth every 6 (six) hours. 08/26/16   Sharlene DoryWendling, Nicholas Paul, DO    Family History Family History  Problem Relation Age of Onset  . Diabetes Maternal Grandmother   . Hypertension Maternal Grandmother   . Diabetes Maternal Grandfather   . Diabetes Paternal Grandmother   . Heart disease Paternal Grandmother   . Diabetes Paternal Grandfather  Social History Social History  Substance Use Topics  . Smoking status: Former Games developer  . Smokeless tobacco: Never Used  . Alcohol use No     Allergies   Prednisone   Review of Systems Review of Systems  Constitutional: Negative for fatigue, fever, unexpected weight change and weight loss.  Cardiovascular: Negative for chest pain.  Gastrointestinal: Negative for abdominal pain and bowel incontinence.  Genitourinary: Negative for bladder incontinence and dysuria.  Musculoskeletal: Positive for back pain.  Neurological: Negative for tingling, weakness, numbness and  paresthesias.     Physical Exam Triage Vital Signs ED Triage Vitals [10/03/16 1743]  Enc Vitals Group     BP (!) 144/91     Pulse Rate (!) 114     Resp      Temp 98.4 F (36.9 C)     Temp Source Oral     SpO2 96 %     Weight 259 lb (117.5 kg)     Height 5\' 11"  (1.803 m)     Head Circumference      Peak Flow      Pain Score 7     Pain Loc      Pain Edu?      Excl. in GC?    No data found.   Updated Vital Signs BP (!) 144/91 (BP Location: Left Arm)   Pulse (!) 114   Temp 98.4 F (36.9 C) (Oral)   Ht 5\' 11"  (1.803 m)   Wt 259 lb (117.5 kg)   SpO2 96%   BMI 36.12 kg/m   Visual Acuity Right Eye Distance:   Left Eye Distance:   Bilateral Distance:    Right Eye Near:   Left Eye Near:    Bilateral Near:     Physical Exam  Constitutional: He appears well-developed and well-nourished. No distress.  HENT:  Head: Normocephalic.  Right Ear: External ear normal.  Left Ear: External ear normal.  Nose: Nose normal.  Mouth/Throat: Oropharynx is clear and moist.  Eyes: Pupils are equal, round, and reactive to light. Conjunctivae are normal.  Neck: Normal range of motion.  Cardiovascular: Normal heart sounds.   Pulmonary/Chest: Breath sounds normal.  Abdominal: Soft. There is no tenderness.  Musculoskeletal: He exhibits no edema.       Right hip: He exhibits tenderness. He exhibits normal range of motion.       Back:       Legs: Back:  Range of motion relatively well preserved.  Can heel/toe walk and squat without difficulty.    Tenderness over the right paraspinous muscles from L4 to Sacral area.  Straight leg raising test is negative.  Sitting knee extension test is negative.  Strength and sensation in the lower extremities is normal.  Patellar and achilles reflexes are normal.  RIght hip reveals distinct tenderness over the greater trochanter.  Palpating the greater trochanter during resisted lateral abduction of the hip recreates his pain.   Neurological: He is  alert.  Skin: Skin is warm and dry. No rash noted.  Nursing note and vitals reviewed.    UC Treatments / Results  Labs (all labs ordered are listed, but only abnormal results are displayed) Labs Reviewed - No data to display  EKG  EKG Interpretation None       Radiology No results found.  Procedures Procedures (including critical care time)  Medications Ordered in UC Medications - No data to display   Initial Impression / Assessment and Plan / UC Course  I  have reviewed the triage vital signs and the nursing notes.  Pertinent labs & imaging results that were available during my care of the patient were reviewed by me and considered in my medical decision making (see chart for details).    Administered Decadron 10mg  IM.  Apply ice pack for 20 to 30 minutes, 3 to 4 times daily  Continue until pain and swelling decrease.  Begin range of motion and stretching exercises. Begin decadron taper: Take Dexamethasone as follows: Thursday and Friday:  1 tab in morning and 1 in evening. Saturday:  1 tab Sunday and Monday:  1/2 tab each day. Followup with Dr. Rodney Langton or Dr. Clementeen [facility] ([facility]) if not improving about two weeks.     Final Clinical Impressions(s) / UC Diagnoses   Final diagnoses:  Pain of right sacroiliac joint  Trochanteric bursitis, right hip    New Prescriptions New Prescriptions   DEXAMETHASONE (DECADRON) 1.5 MG TABLET    Take by mouth as directed.        Lattie Haw, MD 10/13/16 1444

## 2016-10-03 NOTE — Discharge Instructions (Signed)
Apply ice pack for 20 to 30 minutes, 3 to 4 times daily  Continue until pain and swelling decrease.  Begin range of motion and stretching exercises.  Take Dexamethasone as follows: Thursday and Friday:  1 tab in morning and 1 in evening. Saturday:  1 tab Sunday and Monday:  1/2 tab each day.

## 2016-10-09 ENCOUNTER — Ambulatory Visit: Payer: Self-pay | Admitting: Physical Therapy

## 2016-10-09 ENCOUNTER — Telehealth: Payer: Self-pay | Admitting: Physical Therapy

## 2016-10-09 NOTE — Telephone Encounter (Signed)
Left message for pt regarding no show for 8am apt today. Advised of next apt Thur at 8:45 and policy that 2 no shows would result in cancelling appointments to schedule one at a time. Requested call back. Isobel Eisenhuth C. Marque Bango PT, DPT 10/09/16 2:30 PM

## 2016-10-10 ENCOUNTER — Encounter (HOSPITAL_COMMUNITY): Payer: Self-pay

## 2016-10-10 ENCOUNTER — Emergency Department (HOSPITAL_COMMUNITY)
Admission: EM | Admit: 2016-10-10 | Discharge: 2016-10-10 | Disposition: A | Discharge status: Home / Self Care | Payer: Self-pay | Attending: Emergency Medicine | Admitting: Emergency Medicine

## 2016-10-10 ENCOUNTER — Emergency Department (HOSPITAL_COMMUNITY)
Admission: EM | Admit: 2016-10-10 | Discharge: 2016-10-11 | Disposition: A | Discharge status: Home / Self Care | Payer: Self-pay | Attending: Emergency Medicine | Admitting: Emergency Medicine

## 2016-10-10 ENCOUNTER — Encounter (HOSPITAL_COMMUNITY): Payer: Self-pay | Admitting: Emergency Medicine

## 2016-10-10 DIAGNOSIS — M5441 Lumbago with sciatica, right side: Secondary | ICD-10-CM | POA: Insufficient documentation

## 2016-10-10 DIAGNOSIS — I10 Essential (primary) hypertension: Secondary | ICD-10-CM | POA: Insufficient documentation

## 2016-10-10 DIAGNOSIS — J45909 Unspecified asthma, uncomplicated: Secondary | ICD-10-CM | POA: Insufficient documentation

## 2016-10-10 DIAGNOSIS — M549 Dorsalgia, unspecified: Secondary | ICD-10-CM | POA: Insufficient documentation

## 2016-10-10 DIAGNOSIS — Z87891 Personal history of nicotine dependence: Secondary | ICD-10-CM | POA: Insufficient documentation

## 2016-10-10 LAB — CBC WITH DIFFERENTIAL/PLATELET
Basophils Absolute: 0 10*3/uL (ref 0.0–0.1)
Basophils Relative: 0 %
Eosinophils Absolute: 0.1 10*3/uL (ref 0.0–0.7)
Eosinophils Relative: 1 %
HCT: 42.4 % (ref 39.0–52.0)
Hemoglobin: 15.2 g/dL (ref 13.0–17.0)
Lymphocytes Relative: 17 %
Lymphs Abs: 1.8 10*3/uL (ref 0.7–4.0)
MCH: 31.2 pg (ref 26.0–34.0)
MCHC: 35.8 g/dL (ref 30.0–36.0)
MCV: 87.1 fL (ref 78.0–100.0)
Monocytes Absolute: 0.5 10*3/uL (ref 0.1–1.0)
Monocytes Relative: 5 %
Neutro Abs: 8 10*3/uL — ABNORMAL HIGH (ref 1.7–7.7)
Neutrophils Relative %: 77 %
Platelets: 205 10*3/uL (ref 150–400)
RBC: 4.87 MIL/uL (ref 4.22–5.81)
RDW: 13.7 % (ref 11.5–15.5)
WBC: 10.3 10*3/uL (ref 4.0–10.5)

## 2016-10-10 LAB — BASIC METABOLIC PANEL
Anion gap: 9 (ref 5–15)
BUN: 17 mg/dL (ref 6–20)
CO2: 28 mmol/L (ref 22–32)
Calcium: 8.9 mg/dL (ref 8.9–10.3)
Chloride: 101 mmol/L (ref 101–111)
Creatinine, Ser: 1.06 mg/dL (ref 0.61–1.24)
GFR calc Af Amer: >60 mL/min (ref >60)
GFR calc non Af Amer: >60 mL/min (ref >60)
Glucose, Bld: 100 mg/dL — ABNORMAL HIGH (ref 65–99)
Potassium: 3.4 mmol/L — ABNORMAL LOW (ref 3.5–5.1)
Sodium: 138 mmol/L (ref 135–145)

## 2016-10-10 MED ORDER — METHOCARBAMOL 1000 MG/10ML IJ SOLN: 1000.0000 mg | Freq: Once | INTRAMUSCULAR | Status: DC

## 2016-10-10 MED ORDER — METHOCARBAMOL 500 MG PO TABS
750.0000 mg | ORAL_TABLET | Freq: Once | ORAL | Status: AC
  Administered 2016-10-10: 750 mg via ORAL
  Filled 2016-10-10: qty 2

## 2016-10-10 MED ORDER — DEXAMETHASONE SODIUM PHOSPHATE 10 MG/ML IJ SOLN
10.0000 mg | Freq: Once | INTRAMUSCULAR | Status: AC
  Administered 2016-10-10: 10 mg via INTRAMUSCULAR
  Filled 2016-10-10: qty 1

## 2016-10-10 MED ORDER — KETOROLAC TROMETHAMINE 60 MG/2ML IM SOLN
30.0000 mg | Freq: Once | INTRAMUSCULAR | Status: AC
  Administered 2016-10-10: 30 mg via INTRAMUSCULAR
  Filled 2016-10-10: qty 2

## 2016-10-10 NOTE — ED Provider Notes (Signed)
[facility] Provider Note   CSN: [id number] Arrival date & time: 10/10/16  2139     History   Chief Complaint Chief Complaint  Patient presents with  . Back Pain    HPI Walter Howell is a 31 y.o. male with history of anxiety, asthma, GERD, HTN, and chronic bilateral low back pain who presents today with chief complaint gradual onset, progressively worsening right-sided low back pain for 2 weeks. He states that he has been having right-sided back pain for over 10 months now but endorses acute worsening 2 weeks ago. Denies any trauma, falls, or heavy lifting. No IV drug use, fevers, history of cancer, night sweats, or unintentional weight loss. States this feels like his usual back pain but worse. Pain radiates from the right lumbar spine down the back of the right leg to the toes. Endorses numbness to the toes. He went to urgent care 1 week ago where he received dexamethasone IM which she states was temporarily helpful. He was sent home on a Decadron taper which he states he did not fill because he could not afford. He has been treating his pain with ibuprofen, Tylenol, Flexeril which are mildly helpful. Pain worsens with bending and ambulation, improved somewhat laying on the left side and sitting. Denies saddle anesthesia or bowel or bladder incontinence.  Earlier today, he states that he awoke from a nap and went down to the kitchen to get a glass of water. He states that during this time his pain worsened and he felt nauseous and lightheaded. He states he went to sit down on the couch and then "I passed out several times and was out of it". He states that his boyfriend observed him pass out multiple times for 1-3 minutes at a time. Endorses continued lightheadedness which is intermittent and he thinks is related to his pain.  The history is provided by the patient.    Past Medical History:  Diagnosis Date  . Anxiety   . Asthma   . GERD (gastroesophageal reflux disease)     . Hypertension     Patient Active Problem List   Diagnosis Date Noted  . Anxiety and depression 12/06/2015  . Chronic bilateral low back pain without sciatica 12/06/2015  . Essential hypertension, benign 07/08/2013    History reviewed. No pertinent surgical history.     Home Medications    Prior to Admission medications   Medication Sig Start Date End Date Taking? Authorizing Provider  acetaminophen (TYLENOL) 500 MG tablet Take 1,000 mg by mouth every 6 (six) hours as needed for mild pain, moderate pain, fever or headache.    [provider]  amLODipine (NORVASC) 10 MG tablet Take 1 tablet (10 mg total) by mouth daily. 12/06/15   Funches, Gerilyn Nestle, MD  cetirizine (ZYRTEC) 10 MG tablet Take 10 mg by mouth daily.    [provider]  chlorthalidone (HYGROTON) 25 MG tablet Take 1 tablet (25 mg total) by mouth daily. 09/18/16   Funches, Gerilyn Nestle, MD  cyclobenzaprine (FLEXERIL) 10 MG tablet Take 1 tablet (10 mg total) by mouth 2 (two) times daily as needed for muscle spasms. 08/26/16   Sharlene Dory, DO  dexamethasone (DECADRON) 1.5 MG tablet Take by mouth as directed. 10/03/16   Lattie Haw, MD  diclofenac (VOLTAREN) 75 MG EC tablet Take 1 tablet (75 mg total) by mouth 2 (two) times daily. 09/18/16   Funches, Gerilyn Nestle, MD  DULoxetine (CYMBALTA) 20 MG capsule Take 1 capsule (20 mg total) by  mouth daily. 12/06/15   Funches, Gerilyn Nestle, MD  methocarbamol (ROBAXIN) 500 MG tablet Take 1 tablet (500 mg total) by mouth every 8 (eight) hours as needed for muscle spasms. 10/11/16   Lakyra Tippins A, PA-C  Multiple Vitamin (MULTIVITAMIN WITH MINERALS) TABS tablet Take 1 tablet by mouth daily.    [provider]  naproxen (EC-NAPROSYN) 500 MG EC tablet Take 1 tablet (500 mg total) by mouth 2 (two) times daily with a meal. 10/11/16 10/26/16  Sherlie Boyum A, PA-C  ondansetron (ZOFRAN) 4 MG tablet Take 1 tablet (4 mg total) by mouth every 6 (six) hours. 08/26/16   Sharlene Dory, DO    Family History Family History  Problem Relation Age of Onset  . Diabetes Maternal Grandmother   . Hypertension Maternal Grandmother   . Diabetes Maternal Grandfather   . Diabetes Paternal Grandmother   . Heart disease Paternal Grandmother   . Diabetes Paternal Grandfather     Social History Social History  Substance Use Topics  . Smoking status: Former Games developer  . Smokeless tobacco: Never Used  . Alcohol use No     Allergies   Prednisone   Review of Systems Review of Systems  Constitutional: Positive for chills. Negative for fever.  Respiratory: Negative for shortness of breath.   Cardiovascular: Negative for chest pain.  Gastrointestinal: Negative for abdominal pain, diarrhea, nausea and vomiting.  Genitourinary: Negative for dysuria and hematuria.  Musculoskeletal: Positive for back pain. Negative for neck pain.  Neurological: Positive for light-headedness and numbness.  All other systems reviewed and are negative.    Physical Exam Updated Vital Signs BP 132/77 (BP Location: Right Arm)   Pulse 81   Temp 98.7 F (37.1 C) (Oral)   Resp (!) 22   Ht 5\' 11"  (1.803 m)   Wt 117.9 kg (260 lb)   SpO2 97%   BMI 36.26 kg/m   Physical Exam  Constitutional: He is oriented to person, place, and time. He appears well-developed and well-nourished. No distress.  Laying in the left lateral decubitus position, appears uncomfortable especially on movement of the right leg  HENT:  Head: Normocephalic and atraumatic.  Right Ear: External ear normal.  Left Ear: External ear normal.  Mouth/Throat: Oropharynx is clear and moist.  Eyes: Pupils are equal, round, and reactive to light. Conjunctivae and EOM are normal. Right eye exhibits no discharge. Left eye exhibits no discharge.  Neck: Normal range of motion. Neck supple. No JVD present. No tracheal deviation present.  Cardiovascular: Normal rate, regular rhythm, normal heart sounds and intact distal pulses.   2+  radial and DP/PT pulses bl, negative Homan's bl   Pulmonary/Chest: Effort normal and breath sounds normal.  Abdominal: Soft. Bowel sounds are normal. He exhibits no distension. There is no tenderness.  Musculoskeletal: He exhibits tenderness. He exhibits no edema or deformity.  Diffuse tenderness to palpation of the lumbar spine and the right paraspinal musculature. No deformity, crepitus, step-off, or focal tenderness. Right SI joint tenderness present. Decreased range of motion of the lumbar spine and the right lower extremity due to pain. 5/5 strength of bilateral lower extremities with flexion and extension against resistance, however painful on the right side.  Neurological: He is alert and oriented to person, place, and time. No cranial nerve deficit or sensory deficit.  Fluent speech, no facial droop, sensation intact to soft touch of bilateral lower extremities. Patient refuses to ambulate due to pain so unable to assess gait.   Skin: Skin is  warm and dry. Capillary refill takes less than 2 seconds. No erythema.  Psychiatric: He has a normal mood and affect. His behavior is normal.  Nursing note and vitals reviewed.    ED Treatments / Results  Labs (all labs ordered are listed, but only abnormal results are displayed) Labs Reviewed  CBC WITH DIFFERENTIAL/PLATELET - Abnormal; Notable for the following:       Result Value   Neutro Abs 8.0 (*)    All other components within normal limits  BASIC METABOLIC PANEL - Abnormal; Notable for the following:    Potassium 3.4 (*)    Glucose, Bld 100 (*)    All other components within normal limits    EKG  EKG Interpretation  Date/Time:  Thursday October 11 2016 00:06:46 EDT Ventricular Rate:  72 PR Interval:  174 QRS Duration: 94 QT Interval:  366 QTC Calculation: 400 R Axis:   81 Text Interpretation:  Normal sinus rhythm Nonspecific ST abnormality Abnormal ECG Nonspecific TWI in lead III Otherwise no significant change Confirmed by  Shaune PollackIsaacs, [place] [id number]) on 10/11/2016 12:18:38 AM       Radiology No results found.  Procedures Procedures (including critical care time)  Medications Ordered in ED Medications  ketorolac (TORADOL) injection 30 mg (30 mg Intramuscular Given 10/10/16 2341)  dexamethasone (DECADRON) injection 10 mg (10 mg Intramuscular Given 10/10/16 2341)  methocarbamol (ROBAXIN) tablet 750 mg (750 mg Oral Given 10/10/16 2341)  HYDROcodone-acetaminophen (NORCO/VICODIN) 5-325 MG per tablet 1 tablet (1 tablet Oral Given 10/11/16 0023)  HYDROcodone-acetaminophen (NORCO/VICODIN) 5-325 MG per tablet 1 tablet (1 tablet Oral Given 10/11/16 0200)     Initial Impression / Assessment and Plan / ED Course  I have reviewed the triage vital signs and the nursing notes.  Pertinent labs & imaging results that were available during my care of the patient were reviewed by me and considered in my medical decision making (see chart for details).     Patient with acute on chronic right-sided low back pain with sciatica. No red flags signs to suggest cauda equina syndrome. Afebrile, vital signs are stable. He describes lightheadedness and a possible episode of syncope which sounds more like grogginess after awakening from a nap. His EKG shows normal sinus rhythm and no evidence of arrhythmia or Brugada syndrome. He is not anemic and he has no significant electrolyte abnormalities. I doubt anemia as the source of his lightheadedness. He seems to believe it is due to his pain which I think is reasonable. No pulsatile masses to suggest AAA. After administration of Toradol, Decadron, and Robaxin he states his symptoms are no better. Given 1 tablet of Norco. During this time he endorses significant improvement in symptoms and is ambulatory with an antalgic gait due to pain. He states pain and spasms returned prior to discharge. [facility] [place] substance [place], he has not been prescribed any narcotic pain medications.  Provided one more tablet of Norco with relief of pain. Stable for discharge home with Robaxin and Naprosyn. Emphasized the importance of follow-up with primary care and physical therapy for continued improvement. Discussed indications for return to the ED. Pt verbalized understanding of and agreement with plan and is safe for discharge home at this time.   Final Clinical Impressions(s) / ED Diagnoses   Final diagnoses:  Acute right-sided low back pain with right-sided sciatica    New Prescriptions Discharge Medication List as of 10/11/2016  1:25 AM    START taking these medications   Details  methocarbamol (  ROBAXIN) 500 MG tablet Take 1 tablet (500 mg total) by mouth every 8 (eight) hours as needed for muscle spasms., Starting Thu 10/11/2016, Print    naproxen (EC-NAPROSYN) 500 MG EC tablet Take 1 tablet (500 mg total) by mouth 2 (two) times daily with a meal., Starting Thu 10/11/2016, Until Fri 10/26/2016, Print         Luevenia Maxin, [place] A, PA-C 10/11/16 1443    Shaune Pollack, MD 10/12/16 [phone]

## 2016-10-10 NOTE — ED Triage Notes (Signed)
Pt c/o shooting right side back pain radiating to upper back and right leg x 10 months, seen in Urgent Care on 10/03/16, states pain has progressed but not changed in character. States he was unable to afford prescribed medicine so has been self-treating with Tylenol, ibuprofen, muscle relaxers, and heating pads.

## 2016-10-10 NOTE — ED Triage Notes (Signed)
Pt complains of low back pain that radiates to his feet since 4am, was here and waiting until noon but wasn't seen, pt states the pain is worse

## 2016-10-10 NOTE — ED Notes (Signed)
Pt c/o pain in bil flsnk area radiating up back, trying to obtain urine sample.

## 2016-10-11 ENCOUNTER — Ambulatory Visit: Payer: Self-pay | Admitting: Physical Therapy

## 2016-10-11 ENCOUNTER — Telehealth: Payer: Self-pay | Admitting: Physical Therapy

## 2016-10-11 MED ORDER — METHOCARBAMOL 500 MG PO TABS: 500.0000 mg | ORAL_TABLET | Freq: Three times a day (TID) | ORAL | 0 refills | Status: DC | PRN

## 2016-10-11 MED ORDER — HYDROCODONE-ACETAMINOPHEN 5-325 MG PO TABS
ORAL_TABLET | ORAL | Status: AC
  Administered 2016-10-11: 1 via ORAL
  Filled 2016-10-11: qty 1

## 2016-10-11 MED ORDER — HYDROCODONE-ACETAMINOPHEN 5-325 MG PO TABS
1.0000 | ORAL_TABLET | Freq: Once | ORAL | Status: AC
  Administered 2016-10-11: 1 via ORAL
  Filled 2016-10-11: qty 1

## 2016-10-11 MED ORDER — HYDROCODONE-ACETAMINOPHEN 5-325 MG PO TABS
1.0000 | ORAL_TABLET | Freq: Once | ORAL | Status: AC
  Administered 2016-10-11: 1 via ORAL

## 2016-10-11 MED ORDER — NAPROXEN 500 MG PO TBEC: 500.0000 mg | DELAYED_RELEASE_TABLET | Freq: Two times a day (BID) | ORAL | 0 refills | Status: AC

## 2016-10-11 NOTE — Telephone Encounter (Signed)
Left message regarding second no-show appointment today. Informed by voicemail that his future appointments are canceled and he will need to call to reschedule if he would like to continue PT.

## 2016-10-11 NOTE — Discharge Instructions (Signed)

## 2016-10-14 ENCOUNTER — Emergency Department (HOSPITAL_COMMUNITY)
Admission: EM | Admit: 2016-10-14 | Discharge: 2016-10-14 | Disposition: A | Discharge status: Home / Self Care | Payer: Self-pay | Attending: Emergency Medicine | Admitting: Emergency Medicine

## 2016-10-14 ENCOUNTER — Encounter (HOSPITAL_COMMUNITY): Payer: Self-pay | Admitting: Emergency Medicine

## 2016-10-14 ENCOUNTER — Emergency Department (HOSPITAL_COMMUNITY): Payer: Self-pay

## 2016-10-14 DIAGNOSIS — J45909 Unspecified asthma, uncomplicated: Secondary | ICD-10-CM | POA: Insufficient documentation

## 2016-10-14 DIAGNOSIS — M5441 Lumbago with sciatica, right side: Secondary | ICD-10-CM | POA: Insufficient documentation

## 2016-10-14 DIAGNOSIS — Z87891 Personal history of nicotine dependence: Secondary | ICD-10-CM | POA: Insufficient documentation

## 2016-10-14 DIAGNOSIS — Z79899 Other long term (current) drug therapy: Secondary | ICD-10-CM | POA: Insufficient documentation

## 2016-10-14 DIAGNOSIS — I10 Essential (primary) hypertension: Secondary | ICD-10-CM | POA: Insufficient documentation

## 2016-10-14 LAB — URINALYSIS, ROUTINE W REFLEX MICROSCOPIC
Bilirubin Urine: NEGATIVE
Glucose, UA: NEGATIVE mg/dL
Hgb urine dipstick: NEGATIVE
KETONES UR: NEGATIVE mg/dL
LEUKOCYTES UA: NEGATIVE
NITRITE: NEGATIVE
PH: 5 (ref 5.0–8.0)
PROTEIN: NEGATIVE mg/dL
Specific Gravity, Urine: 1.015 (ref 1.005–1.030)

## 2016-10-14 MED ORDER — HYDROMORPHONE HCL 1 MG/ML IJ SOLN
1.0000 mg | Freq: Once | INTRAMUSCULAR | Status: DC
  Filled 2016-10-14: qty 1

## 2016-10-14 MED ORDER — OXYCODONE-ACETAMINOPHEN 5-325 MG PO TABS
1.0000 | ORAL_TABLET | Freq: Once | ORAL | Status: AC
  Administered 2016-10-14: 1 via ORAL
  Filled 2016-10-14: qty 1

## 2016-10-14 MED ORDER — OXYCODONE-ACETAMINOPHEN 5-325 MG PO TABS: 1.0000 | ORAL_TABLET | Freq: Four times a day (QID) | ORAL | 0 refills | Status: DC | PRN

## 2016-10-14 MED ORDER — HYDROMORPHONE HCL 1 MG/ML IJ SOLN
1.0000 mg | Freq: Once | INTRAMUSCULAR | Status: AC
  Administered 2016-10-14: 1 mg via INTRAMUSCULAR

## 2016-10-14 MED ORDER — KETOROLAC TROMETHAMINE 30 MG/ML IJ SOLN
30.0000 mg | Freq: Once | INTRAMUSCULAR | Status: AC
  Administered 2016-10-14: 30 mg via INTRAMUSCULAR
  Filled 2016-10-14: qty 1

## 2016-10-14 NOTE — ED Notes (Signed)
Patient transported to MRI 

## 2016-10-14 NOTE — ED Provider Notes (Signed)
[facility]-EMERGENCY DEPT Provider Note   CSN: [id number] Arrival date & time: 10/14/16  1146     History   Chief Complaint Chief Complaint  Patient presents with  . Back Pain    HPI Walter Howell is a 31 y.o. male.  HPI   31 year old male presents today with complaints of back pain.  Patient reports that approximately 1 month ago he started developing right lower left back pain.  Patient notes this is made worse with any movement or palpation of his lower back.  He also notes pain down into his right lower extremity.  Patient notes he has intermittent loss of sensation strength and motor function of his lower extremity.  He also notes the lower extremity is tender to palpation.  He denies any swelling edema, warmth to touch, any infectious etiology, IV drug use, or any other significant red flags here today.  Patient notes he has been seen by numerous providers including sports medicine, he has been prescribed anti-inflammatories, Tylenol, muscle relaxers, steroids with no significant improvement in his symptoms.  Patient denies any trauma preceding his symptoms.  He denies any significant history of back pain.      Past Medical History:  Diagnosis Date  . Anxiety   . Asthma   . GERD (gastroesophageal reflux disease)   . Hypertension     Patient Active Problem List   Diagnosis Date Noted  . Anxiety and depression 12/06/2015  . Chronic bilateral low back pain without sciatica 12/06/2015  . Essential hypertension, benign 07/08/2013    History reviewed. No pertinent surgical history.     Home Medications    Prior to Admission medications   Medication Sig Start Date End Date Taking? Authorizing Provider  acetaminophen (TYLENOL) 500 MG tablet Take 1,000 mg by mouth every 6 (six) hours as needed for mild pain, moderate pain, fever or headache.   Yes [provider]  amLODipine (NORVASC) 10 MG tablet Take 1 tablet (10 mg total) by mouth daily. 12/06/15  Yes  Funches, Josalyn, MD  dexamethasone (DECADRON) 1.5 MG tablet Take by mouth as directed. Patient taking differently: Take 1.5 mg by mouth as directed. Take by mouth as directed. 10/03/16  Yes Lattie Haw, MD  ibuprofen (ADVIL,MOTRIN) 800 MG tablet Take 800 mg by mouth every 8 (eight) hours as needed for mild pain.   Yes [provider]  Menthol-Camphor (ICY HOT ADVANCED RELIEF) 16-11 % CREA Apply 1 application topically daily as needed (pain).   Yes [provider]  methocarbamol (ROBAXIN) 500 MG tablet Take 1 tablet (500 mg total) by mouth every 8 (eight) hours as needed for muscle spasms. 10/11/16  Yes Fawze, Mina A, PA-C  Multiple Vitamin (MULTIVITAMIN WITH MINERALS) TABS tablet Take 1 tablet by mouth daily.   Yes [provider]  chlorthalidone (HYGROTON) 25 MG tablet Take 1 tablet (25 mg total) by mouth daily. Patient not taking: Reported on 10/14/2016 09/18/16   Dessa Phi, MD  cyclobenzaprine (FLEXERIL) 10 MG tablet Take 1 tablet (10 mg total) by mouth 2 (two) times daily as needed for muscle spasms. Patient not taking: Reported on 10/14/2016 08/26/16   Sharlene Dory, DO  diclofenac (VOLTAREN) 75 MG EC tablet Take 1 tablet (75 mg total) by mouth 2 (two) times daily. Patient not taking: Reported on 10/14/2016 09/18/16   Dessa Phi, MD  DULoxetine (CYMBALTA) 20 MG capsule Take 1 capsule (20 mg total) by mouth daily. 12/06/15   Dessa Phi, MD  naproxen (EC-NAPROSYN) 500  MG EC tablet Take 1 tablet (500 mg total) by mouth 2 (two) times daily with a meal. Patient not taking: Reported on 10/14/2016 10/11/16 10/26/16  Michela Pitcher A, PA-C  ondansetron (ZOFRAN) 4 MG tablet Take 1 tablet (4 mg total) by mouth every 6 (six) hours. Patient not taking: Reported on 10/14/2016 08/26/16   Sharlene Dory, DO  oxyCODONE-acetaminophen (PERCOCET) 5-325 MG tablet Take 1 tablet by mouth every 6 (six) hours as needed for severe pain. 10/14/16   Eyvonne Mechanic,  PA-C    Family History Family History  Problem Relation Age of Onset  . Diabetes Maternal Grandmother   . Hypertension Maternal Grandmother   . Diabetes Maternal Grandfather   . Diabetes Paternal Grandmother   . Heart disease Paternal Grandmother   . Diabetes Paternal Grandfather     Social History Social History  Substance Use Topics  . Smoking status: Former Games developer  . Smokeless tobacco: Never Used  . Alcohol use No     Allergies   Prednisone   Review of Systems Review of Systems  All other systems reviewed and are negative.    Physical Exam Updated Vital Signs BP (!) 157/85 (BP Location: Right Arm)   Pulse 67   Temp 98.1 F (36.7 C) (Oral)   Resp 16   Ht 5\' 11"  (1.803 m)   Wt 117.9 kg (260 lb)   SpO2 98%   BMI 36.26 kg/m   Physical Exam  Constitutional: He is oriented to person, place, and time. He appears well-developed and well-nourished.  HENT:  Head: Normocephalic and atraumatic.  Eyes: Pupils are equal, round, and reactive to light. Conjunctivae are normal. Right eye exhibits no discharge. Left eye exhibits no discharge. No scleral icterus.  Neck: Normal range of motion. No JVD present. No tracheal deviation present.  Pulmonary/Chest: Effort normal. No stridor.  Musculoskeletal:  No CT or L-spine tenderness.  Tenderness palpation of the right lateral lumbar and upper gluteus-patient has tenderness to light palpation of the right leg.  He has no swelling warmth or signs of infection, pedal pulses are 2+, patellar reflexes are 2+ bilateral.  Patient unable to lift right leg on command, but is able to use the leg normally to get from one position to the next in the bed.  Straight leg positive with both radiation of symptoms down the leg and right sided back pain  Neurological: He is alert and oriented to person, place, and time. Coordination normal.  Psychiatric: He has a normal mood and affect. His behavior is normal. Judgment and thought content normal.    Nursing note and vitals reviewed.    ED Treatments / Results  Labs (all labs ordered are listed, but only abnormal results are displayed) Labs Reviewed  URINALYSIS, ROUTINE W REFLEX MICROSCOPIC    EKG  EKG Interpretation None       Radiology Mr Lumbar Spine Wo Contrast  Result Date: 10/14/2016 CLINICAL DATA:  Severe back pain extending into the right leg, acute presentation. EXAM: MRI LUMBAR SPINE WITHOUT CONTRAST TECHNIQUE: Multiplanar, multisequence MR imaging of the lumbar spine was performed. No intravenous contrast was administered. COMPARISON:  Radiography 09/03/2016 FINDINGS: Segmentation:  5 lumbar type vertebral bodies. Alignment:  Normal Vertebrae: No fracture or primary bone lesion. Mild discogenic marrow changes at L5-S1. Conus medullaris: Extends to the L1 level and appears normal. Paraspinal and other soft tissues: Normal Disc levels: No abnormality at L4-5 or above. The discs are normal. No stenosis. No facet arthropathy. L5-S1: Broad-based, slightly right-sided  predominant disc herniation with proximal foraminal extension on the right. Likely compression of the right L5 nerve. Slight caudal down turning of disc material behind the superior endplate of S1, left more than right. No clear S1 nerve compression. IMPRESSION: Single level pathology at L5-S1. Shallow broad-based disc herniation, more prominent towards the right, with proximal foraminal extension on the right likely to compress the right L5 nerve. Slight caudal down turning, left more than right, but without clear compression of the S1 nerves. Electronically Signed   By: Paulina Fusi M.D.   On: 10/14/2016 18:56    Procedures Procedures (including critical care time)  Medications Ordered in ED Medications  ketorolac (TORADOL) 30 MG/ML injection 30 mg (30 mg Intramuscular Given 10/14/16 1523)  HYDROmorphone (DILAUDID) injection 1 mg (1 mg Intramuscular Given 10/14/16 1802)  oxyCODONE-acetaminophen  (PERCOCET/ROXICET) 5-325 MG per tablet 1 tablet (1 tablet Oral Given 10/14/16 1947)     Initial Impression / Assessment and Plan / ED Course  I have reviewed the triage vital signs and the nursing notes.  Pertinent labs & imaging results that were available during my care of the patient were reviewed by me and considered in my medical decision making (see chart for details).      Final Clinical Impressions(s) / ED Diagnoses   Final diagnoses:  Acute right-sided low back pain with right-sided sciatica    31 year old male presents today with back pain.  He has been seen numerous times in the emergency room for the same.  He has tried numerous therapies without significant improvement in his symptoms.  Patient is reporting weakness and loss of sensation to the whole right lower extremity.  I find this inconsistent with his exam, he had pain to even light palpation of his anterior knee.  He has intact patellar reflexes, no bowel or bladder incontinence, no fever, no other red flags.  Given patient's numerous ER visits and his reported weakness and loss of sensation throughout the entire dermatome he will have MRI here to exclude any significant neural impingement.   MRI results shown above.  Patient will be referred to neurosurgery for further evaluation and management.  I discussed this with him and his mother on the phone.  They will follow-up immediately, return with any new or worsening signs or symptoms.  Patient has had steroids previously with no improvement in symptoms no need for repeat of ineffective therapy at this time.  Patient will be given a short course of pain medicine until outpatient follow-up.  Patient no further questions or concerns at time discharge.  New Prescriptions Discharge Medication List as of 10/14/2016  7:47 PM    START taking these medications   Details  oxyCODONE-acetaminophen (PERCOCET) 5-325 MG tablet Take 1 tablet by mouth every 6 (six) hours as needed for  severe pain., Starting Sun 10/14/2016, Print         Junia Nygren, [place], PA-C 10/14/16 2040    Arby Barrette, MD 10/24/16 [phone]

## 2016-10-14 NOTE — ED Triage Notes (Signed)
The patient was seen here four days ago for the same issues.  He was seen at [facility] on Thursday for the same issue, but he advised the pain has gotten worse.  He was prescribed muscle relaxer and naproxen and he said it is not working.  He rates his pain 10/10.  He did add that the pain makes him SOB.

## 2016-10-14 NOTE — Discharge Instructions (Signed)
Please read attached information. If you experience any new or worsening signs or symptoms please return to the emergency room for evaluation. Please follow-up with your primary care provider or specialist as discussed. Please use medication prescribed only as directed and discontinue taking if you have any concerning signs or symptoms.   °

## 2016-10-15 ENCOUNTER — Encounter: Payer: Self-pay | Admitting: Physical Therapy

## 2016-10-17 ENCOUNTER — Encounter: Payer: Self-pay | Admitting: Physical Therapy

## 2016-10-19 ENCOUNTER — Encounter: Payer: Self-pay | Admitting: Physical Therapy

## 2016-10-23 ENCOUNTER — Encounter: Payer: Self-pay | Admitting: Physical Therapy

## 2016-10-25 ENCOUNTER — Encounter: Payer: Self-pay | Admitting: Physical Therapy

## 2016-10-30 ENCOUNTER — Encounter: Payer: Self-pay | Admitting: Physical Therapy

## 2016-11-01 ENCOUNTER — Encounter: Payer: Self-pay | Admitting: Physical Therapy

## 2016-11-05 ENCOUNTER — Encounter: Payer: Self-pay | Admitting: Physical Therapy

## 2016-11-05 ENCOUNTER — Encounter: Payer: Self-pay | Admitting: Family Medicine

## 2016-11-05 ENCOUNTER — Ambulatory Visit: Payer: Self-pay | Attending: Family Medicine | Admitting: Family Medicine

## 2016-11-05 ENCOUNTER — Ambulatory Visit: Payer: Self-pay | Attending: Family Medicine | Admitting: Physical Therapy

## 2016-11-05 VITALS — BP 121/82 | HR 86 | Temp 98.3°F | Resp 18 | Ht 71.0 in | Wt 254.8 lb

## 2016-11-05 DIAGNOSIS — M545 Low back pain, unspecified: Secondary | ICD-10-CM

## 2016-11-05 DIAGNOSIS — F419 Anxiety disorder, unspecified: Secondary | ICD-10-CM | POA: Insufficient documentation

## 2016-11-05 DIAGNOSIS — G8929 Other chronic pain: Secondary | ICD-10-CM | POA: Insufficient documentation

## 2016-11-05 DIAGNOSIS — F32A Depression, unspecified: Secondary | ICD-10-CM

## 2016-11-05 DIAGNOSIS — F329 Major depressive disorder, single episode, unspecified: Secondary | ICD-10-CM | POA: Insufficient documentation

## 2016-11-05 DIAGNOSIS — I10 Essential (primary) hypertension: Secondary | ICD-10-CM | POA: Insufficient documentation

## 2016-11-05 DIAGNOSIS — Z79899 Other long term (current) drug therapy: Secondary | ICD-10-CM | POA: Insufficient documentation

## 2016-11-05 DIAGNOSIS — M5126 Other intervertebral disc displacement, lumbar region: Secondary | ICD-10-CM | POA: Insufficient documentation

## 2016-11-05 DIAGNOSIS — Z79891 Long term (current) use of opiate analgesic: Secondary | ICD-10-CM | POA: Insufficient documentation

## 2016-11-05 DIAGNOSIS — M5441 Lumbago with sciatica, right side: Secondary | ICD-10-CM | POA: Insufficient documentation

## 2016-11-05 MED ORDER — DULOXETINE HCL 20 MG PO CPEP: 20.0000 mg | ORAL_CAPSULE | Freq: Every day | ORAL | 2 refills | Status: DC

## 2016-11-05 MED ORDER — METHOCARBAMOL 500 MG PO TABS: 500.0000 mg | ORAL_TABLET | Freq: Three times a day (TID) | ORAL | 0 refills | Status: DC | PRN

## 2016-11-05 MED ORDER — CHLORTHALIDONE 25 MG PO TABS: 25.0000 mg | ORAL_TABLET | Freq: Every day | ORAL | 3 refills | Status: DC

## 2016-11-05 MED ORDER — NAPROXEN 500 MG PO TABS: ORAL_TABLET | ORAL | 0 refills | Status: DC

## 2016-11-05 MED ORDER — ACETAMINOPHEN-CODEINE 300-30 MG PO TABS: 1.0000 | ORAL_TABLET | Freq: Four times a day (QID) | ORAL | 0 refills | Status: DC | PRN

## 2016-11-05 MED FILL — ?CHLORTHALIDONE 25 MG TABLE: 25 | 30 days supply | Qty: 30 | Fill #0

## 2016-11-05 MED FILL — ACETAMINOPHEN/COD #3 TABLET: 300-30 | 10 days supply | Qty: 40 | Fill #0

## 2016-11-05 MED FILL — NAPROXEN 500 MG TABLET: 500 | 20 days supply | Qty: 60 | Fill #0

## 2016-11-05 MED FILL — DULoxetine HCL 20 MG CPEP: 20 | 30 days supply | Qty: 30 | Fill #0

## 2016-11-05 MED FILL — METHOCARBAMOL 500 MG TABS: 500 | 10 days supply | Qty: 30 | Fill #0

## 2016-11-05 NOTE — Progress Notes (Signed)
Patient is here for lower back pain.

## 2016-11-05 NOTE — Progress Notes (Signed)
Subjective:  Patient ID: Walter Howell, male    DOB: 03-08-1985  Age: 31 y.o. MRN: [id number]  CC: Establish Care   HPI Walter Howell presents for chronic low back pain Symptoms have been present for 1 month and include pain in lumbar (aching and throbbing in character; 9/10 in severity) and paresthesias in RLE. Initial inciting event: none. Patient does reports history of heavy lifting.  Alleviating factors identifiable by patient are none. Exacerbating factors identifiable by patient are bending backwards, bending forwards, recumbency and sitting.Previous workup: xray and MRI. MRI 10/14/16 showed disc herniation at the L5-S1.  Previous treatments: muscle relaxants, steroids, and narcotic medications. History of anxiety/ depression. Onset was approximately 3 years ago, stable that time.  He denies current suicidal and homicidal plan or intent.  Risk factors: previous episode of depression Previous treatment includes cymbalta. He denies receiving any counseling or therapy. He request referral for counseling.  He is agreeable to speaking with LCSW. He complains of the following side effects from the treatment: none. History of HTN. He does not check BP at home. He reports only taking chlorthalidone for BP. Cardiac symptoms none. Patient denies chest pain, chest pressure/discomfort, lower extremity edema, near-syncope, palpitations and syncope.  Cardiovascular risk factors: hypertension, male gender and sedentary lifestyle. Use of agents associated with hypertension: none. History of target organ damage: none.   Outpatient Medications Prior to Visit  Medication Sig Dispense Refill  . acetaminophen (TYLENOL) 500 MG tablet Take 1,000 mg by mouth every 6 (six) hours as needed for mild pain, moderate pain, fever or headache.    . diclofenac (VOLTAREN) 75 MG EC tablet Take 1 tablet (75 mg total) by mouth 2 (two) times daily. (Patient not taking: Reported on 10/14/2016) 60 tablet 0  . ibuprofen  (ADVIL,MOTRIN) 800 MG tablet Take 800 mg by mouth every 8 (eight) hours as needed for mild pain.    . Menthol-Camphor (ICY HOT ADVANCED RELIEF) 16-11 % CREA Apply 1 application topically daily as needed (pain).    . Multiple Vitamin (MULTIVITAMIN WITH MINERALS) TABS tablet Take 1 tablet by mouth daily.    . ondansetron (ZOFRAN) 4 MG tablet Take 1 tablet (4 mg total) by mouth every 6 (six) hours. (Patient not taking: Reported on 10/14/2016) 12 tablet 0  . oxyCODONE-acetaminophen (PERCOCET) 5-325 MG tablet Take 1 tablet by mouth every 6 (six) hours as needed for severe pain. 6 tablet 0  . amLODipine (NORVASC) 10 MG tablet Take 1 tablet (10 mg total) by mouth daily. 30 tablet 11  . chlorthalidone (HYGROTON) 25 MG tablet Take 1 tablet (25 mg total) by mouth daily. (Patient not taking: Reported on 10/14/2016) 30 tablet 5  . cyclobenzaprine (FLEXERIL) 10 MG tablet Take 1 tablet (10 mg total) by mouth 2 (two) times daily as needed for muscle spasms. (Patient not taking: Reported on 10/14/2016) 14 tablet 0  . dexamethasone (DECADRON) 1.5 MG tablet Take by mouth as directed. (Patient taking differently: Take 1.5 mg by mouth as directed. Take by mouth as directed.) 6 tablet 0  . DULoxetine (CYMBALTA) 20 MG capsule Take 1 capsule (20 mg total) by mouth daily. 30 capsule 2  . methocarbamol (ROBAXIN) 500 MG tablet Take 1 tablet (500 mg total) by mouth every 8 (eight) hours as needed for muscle spasms. 20 tablet 0   No facility-administered medications prior to visit.     ROS Review of Systems  Constitutional: Negative.   Eyes: Negative.   Respiratory: Negative.   Cardiovascular: Negative.  Gastrointestinal: Negative.   Genitourinary: Negative.   Musculoskeletal: Positive for back pain and myalgias.  Skin: Negative.   Neurological:       Paresthesias   Psychiatric/Behavioral: Positive for dysphoric mood. Negative for suicidal ideas.    Objective:  BP 121/82 (BP Location: Left Arm, Patient Position:  Sitting, Cuff Size: Normal)   Pulse 86   Temp 98.3 F (36.8 C) (Oral)   Resp 18   Ht  (1.803 m)   Wt 254 lb 12.8 oz (115.6 kg)   SpO2 98%   BMI 35.54 kg/m   BP/Weight 11/05/2016 10/14/2016 10/10/2016  Systolic BP 121 157 132  Diastolic BP 82 85 77  Wt. (Lbs) 254.8 260 260  BMI 35.54 36.26 36.26    Physical Exam  Constitutional: He appears well-developed and well-nourished.  Eyes: Pupils are equal, round, and reactive to light. Conjunctivae are normal.  Neck: No JVD present.  Cardiovascular: Normal rate, regular rhythm, normal heart sounds and intact distal pulses.   Pulmonary/Chest: Effort normal and breath sounds normal.  Abdominal: Soft. Bowel sounds are normal. There is no tenderness.  Musculoskeletal:       Lumbar back: He exhibits decreased range of motion and pain (positive straight leg test to RLE).  Skin: Skin is warm and dry.  Psychiatric: He exhibits a depressed mood. He expresses no homicidal and no suicidal ideation. He expresses no suicidal plans and no homicidal plans.  Nursing note and vitals reviewed.  Assessment & Plan:   1. Chronic bilateral low back pain with right-sided sciatica Urgent referral placed to orthopedics. Patient encouraged to apply for orange card asap. - Acetaminophen-Codeine (TYLENOL/CODEINE #3) 300-30 MG tablet; Take 1 tablet by mouth every 6 (six) hours as needed for pain.  Dispense: 40 tablet; Refill: 0 - naproxen (NAPROSYN) 500 MG tablet; TAKE ONE TABLET BY MOUTH TWICE A DAY WITH MEALS FOR 10 DAYS. THEN TAKE ONCE TABLET BY MOUTH DAILY WITH MEALS AS NEEDED.  Dispense: 60 tablet; Refill: 0 - Ambulatory referral to Orthopedics - methocarbamol (ROBAXIN) 500 MG tablet; Take 1 tablet (500 mg total) by mouth every 8 (eight) hours as needed for muscle spasms.  Dispense: 30 tablet; Refill: 0  2. Essential hypertension, benign BP well controlled on current medications - chlorthalidone (HYGROTON) 25 MG tablet; Take 1 tablet (25 mg total) by  mouth daily.  Dispense: 30 tablet; Refill: 3  3. Lumbar disc herniation Urgent referral to torhopedics - Ambulatory referral to Orthopedics  4. Anxiety and depression Will also refer to LCSW for additional follow up - DULoxetine (CYMBALTA) 20 MG capsule; Take 1 capsule (20 mg total) by mouth daily.  Dispense: 30 capsule; Refill: 2 - Ambulatory referral to Psychology      Meds ordered this encounter  Medications  . Acetaminophen-Codeine (TYLENOL/CODEINE #3) 300-30 MG tablet    Sig: Take 1 tablet by mouth every 6 (six) hours as needed for pain.    Dispense:  40 tablet    Refill:  0    Order Specific Question:   Supervising Provider    Answer:   Quentin Angst [id number]  . naproxen (NAPROSYN) 500 MG tablet    Sig: TAKE ONE TABLET BY MOUTH TWICE A DAY WITH MEALS FOR 10 DAYS. THEN TAKE ONCE TABLET BY MOUTH DAILY WITH MEALS AS NEEDED.    Dispense:  60 tablet    Refill:  0    Order Specific Question:   Supervising Provider    Answer:   Quentin Angst [id number]  .  chlorthalidone (HYGROTON) 25 MG tablet    Sig: Take 1 tablet (25 mg total) by mouth daily.    Dispense:  30 tablet    Refill:  3    Order Specific Question:   Supervising Provider    Answer:   Quentin AngstJEGEDE, OLUGBEMIGA E [id number]  . DULoxetine (CYMBALTA) 20 MG capsule    Sig: Take 1 capsule (20 mg total) by mouth daily.    Dispense:  30 capsule    Refill:  2    Order Specific Question:   Supervising Provider    Answer:   Quentin AngstJEGEDE, OLUGBEMIGA E [id number]  . methocarbamol (ROBAXIN) 500 MG tablet    Sig: Take 1 tablet (500 mg total) by mouth every 8 (eight) hours as needed for muscle spasms.    Dispense:  30 tablet    Refill:  0    Order Specific Question:   Supervising Provider    Answer:   Quentin AngstJEGEDE, OLUGBEMIGA E [id number]    Follow-up: Return in about 6 weeks (around 12/17/2016), or if symptoms worsen or fail to improve, for Back Pain/HTN.   Lizbeth BarkMandesia R Nasya Vincent FNP

## 2016-11-05 NOTE — Patient Instructions (Addendum)
If you develop incontinence, weakness of the arm and legs, nausea/vomiting. Go the Emergency Department.    Back Pain, Adult Back pain is very common. The pain often gets better over time. The cause of back pain is usually not dangerous. Most people can learn to manage their back pain on their own. Follow these instructions at home: Watch your back pain for any changes. The following actions may help to lessen any pain you are feeling:  Stay active. Start with short walks on flat ground if you can. Try to walk farther each day.  Exercise regularly as told by your doctor. Exercise helps your back heal faster. It also helps avoid future injury by keeping your muscles strong and flexible.  Do not sit, drive, or stand in one place for more than 30 minutes.  Do not stay in bed. Resting more than 1-2 days can slow down your recovery.  Be careful when you bend or lift an object. Use good form when lifting: ? Bend at your knees. ? Keep the object close to your body. ? Do not twist.  Sleep on a firm mattress. Lie on your side, and bend your knees. If you lie on your back, put a pillow under your knees.  Take medicines only as told by your doctor.  Put ice on the injured area. ? Put ice in a plastic bag. ? Place a towel between your skin and the bag. ? Leave the ice on for 20 minutes, 2-3 times a day for the first 2-3 days. After that, you can switch between ice and heat packs.  Avoid feeling anxious or stressed. Find good ways to deal with stress, such as exercise.  Maintain a healthy weight. Extra weight puts stress on your back.  Contact a doctor if:  You have pain that does not go away with rest or medicine.  You have worsening pain that goes down into your legs or buttocks.  You have pain that does not get better in one week.  You have pain at night.  You lose weight.  You have a fever or chills. Get help right away if:  You cannot control when you poop (bowel movement) or  pee (urinate).  Your arms or legs feel weak.  Your arms or legs lose feeling (numbness).  You feel sick to your stomach (nauseous) or throw up (vomit).  You have belly (abdominal) pain.  You feel like you may pass out (faint). This information is not intended to replace advice given to you by your health care provider. Make sure you discuss any questions you have with your health care provider. Document Released: 08/01/2007 Document Revised: 07/21/2015 Document Reviewed: 06/16/2013 Elsevier Interactive Patient Education  [employer]2018 [employer]

## 2016-11-05 NOTE — Therapy (Signed)
[place], [place], [address] Phone: [phone]   Fax:  [phone]  Physical Therapy Treatment/Discharge Summary  Patient Details  Name: Walter Howell MRN: [id number] Date of Birth: August 25, 1985 Referring Provider: Boykin Nearing, MD  Encounter Date: 11/05/2016      PT End of Session - 11/05/16 1329    Visit Number 2   Number of Visits 9   Date for PT Re-Evaluation 11/02/16   Authorization Type self pay   PT Start Time 1329   PT Stop Time 1340   PT Time Calculation (min) 11 min   Activity Tolerance Patient tolerated treatment well   Behavior During Therapy [facility] for tasks assessed/performed      Past Medical History:  Diagnosis Date  . Anxiety   . Asthma   . GERD (gastroesophageal reflux disease)   . Hypertension     History reviewed. No pertinent surgical history.  There were no vitals filed for this visit.      Subjective Assessment - 11/05/16 1332    Subjective Pt reports he is not in a lot of pain right now. Did some of the sciatic exercises. Feels a little bit of shooting down R leg, Great toe is numb. Feels like he cannot use the restroom the way he wants to, it is hard for him to go. Feels that he is urinating a lot more often.                                  PT Education - 11/05/16 1342    Education provided Yes   Education Details plan of care, referral to orthopedist    Person(s) Educated Patient   Methods Explanation   Comprehension Verbalized understanding             PT Long Term Goals - 11/05/16 1347      PT LONG TERM GOAL #1   Title Pt will be able to stand for at least 1 hour without increased pain in SIJ   Status Not Met     PT LONG TERM GOAL #2   Title Hip abduction strength to 5/5 bilat for stability to lumbopelvic region   Status Not Met     PT LONG TERM GOAL #3   Title Pt will be able to complete work activities  without limitation by back/leg pain   Status Not Met     PT LONG TERM GOAL #4   Title Pt will be indepedent with HEP and comfortable with proper resting postures for long term care   Status Not Met               Plan - 11/05/16 1343    Clinical Impression Statement Pt returned to PT after a month and 3 ED visits for LBP. According to MD note, MRI revealed disc herniation. Pt is complaining of changes in bowel and bladder which we discussed needs to be cleared before PT progresses. Pt is afraid that this will be the same ortho that he was referred to before because they wanted $200 which he cannot afford. I asked the pt to make an appointment with ortho because it is not appropraite to continue with PT with bowel/bladder changes. Pt was understanding and was encouraged to contact me with any questions.    PT Treatment/Interventions ADLs/Self Care Home Management;Cryotherapy;Electrical Stimulation;Iontophoresis 17m/ml Dexamethasone;Functional mobility training;Stair training;Gait training;Ultrasound;Traction;Moist Heat;Therapeutic activities;Therapeutic exercise;Balance training;Neuromuscular re-education;Patient/family  education;Passive range of motion;Dry needling;Taping   PT Home Exercise Plan hooklying or seated pillow squeeze & clam blue tband   Consulted and Agree with Plan of Care Patient      Patient will benefit from skilled therapeutic intervention in order to improve the following deficits and impairments:  Decreased range of motion, Difficulty walking, Increased muscle spasms, Decreased activity tolerance, Pain, Improper body mechanics, Impaired flexibility, Decreased mobility, Decreased strength, Impaired sensation, Postural dysfunction  Visit Diagnosis: Chronic right-sided low back pain without sciatica     Problem List Patient Active Problem List   Diagnosis Date Noted  . Anxiety and depression 12/06/2015  . Chronic bilateral low back pain without sciatica 12/06/2015   . Essential hypertension, benign 07/08/2013   Janett Billow C. Jewelz Kobus PT, DPT 11/05/16 1:49 PM   [facility] [address] [place], [place], [address] Phone: [phone]   Fax:  [phone]  Name: Walter Howell MRN: [id number] Date of Birth: 1985-08-21

## 2016-11-06 ENCOUNTER — Encounter: Payer: Self-pay | Admitting: Physical Therapy

## 2016-11-08 ENCOUNTER — Encounter: Payer: Self-pay | Admitting: Physical Therapy

## 2016-12-06 ENCOUNTER — Ambulatory Visit: Payer: Self-pay | Admitting: Licensed Clinical Social Worker

## 2016-12-13 ENCOUNTER — Ambulatory Visit: Payer: Self-pay | Admitting: Licensed Clinical Social Worker

## 2016-12-25 ENCOUNTER — Ambulatory Visit: Payer: Self-pay | Attending: Family Medicine | Admitting: Family Medicine

## 2016-12-25 ENCOUNTER — Encounter: Payer: Self-pay | Admitting: Family Medicine

## 2016-12-25 ENCOUNTER — Other Ambulatory Visit: Payer: Self-pay

## 2016-12-25 VITALS — BP 132/81 | HR 70 | Temp 98.3°F | Resp 18 | Ht 71.0 in | Wt 258.0 lb

## 2016-12-25 DIAGNOSIS — Z79899 Other long term (current) drug therapy: Secondary | ICD-10-CM | POA: Insufficient documentation

## 2016-12-25 DIAGNOSIS — Z8739 Personal history of other diseases of the musculoskeletal system and connective tissue: Secondary | ICD-10-CM | POA: Insufficient documentation

## 2016-12-25 DIAGNOSIS — F329 Major depressive disorder, single episode, unspecified: Secondary | ICD-10-CM | POA: Insufficient documentation

## 2016-12-25 DIAGNOSIS — M5441 Lumbago with sciatica, right side: Secondary | ICD-10-CM | POA: Insufficient documentation

## 2016-12-25 DIAGNOSIS — F419 Anxiety disorder, unspecified: Secondary | ICD-10-CM | POA: Insufficient documentation

## 2016-12-25 DIAGNOSIS — G8929 Other chronic pain: Secondary | ICD-10-CM | POA: Insufficient documentation

## 2016-12-25 DIAGNOSIS — R42 Dizziness and giddiness: Secondary | ICD-10-CM | POA: Insufficient documentation

## 2016-12-25 DIAGNOSIS — F32A Depression, unspecified: Secondary | ICD-10-CM

## 2016-12-25 DIAGNOSIS — R9431 Abnormal electrocardiogram [ECG] [EKG]: Secondary | ICD-10-CM | POA: Insufficient documentation

## 2016-12-25 DIAGNOSIS — R002 Palpitations: Secondary | ICD-10-CM | POA: Insufficient documentation

## 2016-12-25 DIAGNOSIS — I10 Essential (primary) hypertension: Secondary | ICD-10-CM | POA: Insufficient documentation

## 2016-12-25 MED ORDER — ACETAMINOPHEN-CODEINE 300-30 MG PO TABS: 1.0000 | ORAL_TABLET | Freq: Four times a day (QID) | ORAL | 0 refills | Status: DC | PRN

## 2016-12-25 MED ORDER — DICLOFENAC SODIUM 75 MG PO TBEC: 75.0000 mg | DELAYED_RELEASE_TABLET | Freq: Two times a day (BID) | ORAL | 1 refills | Status: DC | PRN

## 2016-12-25 MED ORDER — METHOCARBAMOL 500 MG PO TABS: 500.0000 mg | ORAL_TABLET | Freq: Three times a day (TID) | ORAL | 0 refills | Status: DC | PRN

## 2016-12-25 MED ORDER — DULOXETINE HCL 20 MG PO CPEP: 20.0000 mg | ORAL_CAPSULE | Freq: Every day | ORAL | 2 refills | Status: DC

## 2016-12-25 MED ORDER — CHLORTHALIDONE 25 MG PO TABS: 25.0000 mg | ORAL_TABLET | Freq: Every day | ORAL | 1 refills | Status: DC

## 2016-12-25 NOTE — Progress Notes (Deleted)
thepersit  Provide  Therapist   Lightheadedness Looking down  Bright lights Palpitations  EKG  TSH   Tinnitus  Ear popping and ringing

## 2016-12-25 NOTE — Patient Instructions (Addendum)
Apply for orange card and discount.   Dizziness Dizziness is a common problem. It is a feeling of unsteadiness or light-headedness. You may feel like you are about to faint. Dizziness can lead to injury if you stumble or fall. Anyone can become dizzy, but dizziness is more common in older adults. This condition can be caused by a number of things, including medicines, dehydration, or illness. Follow these instructions at home: Taking these steps may help with your condition: Eating and drinking  Drink enough fluid to keep your urine clear or pale yellow. This helps to keep you from becoming dehydrated. Try to drink more clear fluids, such as water.  Do not drink alcohol.  Limit your caffeine intake if directed by your health care provider.  Limit your salt intake if directed by your health care provider. Activity  Avoid making quick movements. ? Rise slowly from chairs and steady yourself until you feel okay. ? In the morning, first sit up on the side of the bed. When you feel okay, stand slowly while you hold onto something until you know that your balance is fine.  Move your legs often if you need to stand in one place for a long time. Tighten and relax your muscles in your legs while you are standing.  Do not drive or operate heavy machinery if you feel dizzy.  Avoid bending down if you feel dizzy. Place items in your home so that they are easy for you to reach without leaning over. Lifestyle  Do not use any tobacco products, including cigarettes, chewing tobacco, or electronic cigarettes. If you need help quitting, ask your health care provider.  Try to reduce your stress level, such as with yoga or meditation. Talk with your health care provider if you need help. General instructions  Watch your dizziness for any changes.  Take medicines only as directed by your health care provider. Talk with your health care provider if you think that your dizziness is caused by a medicine  that you are taking.  Tell a friend or a family member that you are feeling dizzy. If he or she notices any changes in your behavior, have this person call your health care provider.  Keep all follow-up visits as directed by your health care provider. This is important. Contact a health care provider if:  Your dizziness does not go away.  Your dizziness or light-headedness gets worse.  You feel nauseous.  You have reduced hearing.  You have new symptoms.  You are unsteady on your feet or you feel like the room is spinning. Get help right away if:  You vomit or have diarrhea and are unable to eat or drink anything.  You have problems talking, walking, swallowing, or using your arms, hands, or legs.  You feel generally weak.  You are not thinking clearly or you have trouble forming sentences. It may take a friend or family member to notice this.  You have chest pain, abdominal pain, shortness of breath, or sweating.  Your vision changes.  You notice any bleeding.  You have a headache.  You have neck pain or a stiff neck.  You have a fever. This information is not intended to replace advice given to you by your health care provider. Make sure you discuss any questions you have with your health care provider. Document Released: 08/08/2000 Document Revised: 07/21/2015 Document Reviewed: 02/08/2014 Elsevier Interactive Patient Education  2017 [employer]

## 2016-12-25 NOTE — Progress Notes (Signed)
Patient is here for back pain & HTN

## 2016-12-26 LAB — BASIC METABOLIC PANEL
BUN / CREAT RATIO: 17 (ref 9–20)
BUN: 14 mg/dL (ref 6–20)
CHLORIDE: 98 mmol/L (ref 96–106)
CO2: 27 mmol/L (ref 20–29)
Calcium: 10.4 mg/dL — ABNORMAL HIGH (ref 8.7–10.2)
Creatinine, Ser: 0.82 mg/dL (ref 0.76–1.27)
GFR calc Af Amer: 137 mL/min/{1.73_m2} (ref >59)
GFR calc non Af Amer: 119 mL/min/{1.73_m2} (ref >59)
GLUCOSE: 97 mg/dL (ref 65–99)
Potassium: 4 mmol/L (ref 3.5–5.2)
Sodium: 139 mmol/L (ref 134–144)

## 2016-12-26 LAB — CBC
Hematocrit: 47.8 % (ref 37.5–51.0)
Hemoglobin: 16.6 g/dL (ref 13.0–17.7)
MCH: 31.7 pg (ref 26.6–33.0)
MCHC: 34.7 g/dL (ref 31.5–35.7)
MCV: 91 fL (ref 79–97)
PLATELETS: 233 10*3/uL (ref 150–379)
RBC: 5.23 x10E6/uL (ref 4.14–5.80)
RDW: 15.5 % — AB (ref 12.3–15.4)
WBC: 5.8 10*3/uL (ref 3.4–10.8)

## 2016-12-26 LAB — TSH: TSH: 1.29 u[IU]/mL (ref 0.450–4.500)

## 2016-12-28 ENCOUNTER — Telehealth: Payer: Self-pay | Admitting: Family Medicine

## 2016-12-28 MED FILL — ?CHLORTHALIDONE 25 MG TABLE: 25 | 30 days supply | Qty: 30 | Fill #0

## 2016-12-28 NOTE — Telephone Encounter (Signed)
Pt called back to review ekg results, pt was informed results were abnormal and referral has been made for cardiology , appt has been scheduled for patient for further evaluation.

## 2016-12-30 ENCOUNTER — Other Ambulatory Visit: Payer: Self-pay | Admitting: Family Medicine

## 2016-12-30 DIAGNOSIS — R42 Dizziness and giddiness: Secondary | ICD-10-CM

## 2016-12-30 DIAGNOSIS — H9313 Tinnitus, bilateral: Secondary | ICD-10-CM

## 2016-12-30 NOTE — Progress Notes (Signed)
Subjective:  Patient ID: Walter Howell, male    DOB: 02-21-1986  Age: 31 y.o. MRN: [id number]  CC: Hypertension and Back Pain   HPI Walter Howell presents for chronic low back pain Symptoms have been present for 2 month and include pain in lumbar (aching and throbbing in character; 9/10 in severity) and paresthesias in RLE. Initial inciting event: none. Patient does reports history of heavy lifting.  Alleviating factors identifiable by patient are none. Exacerbating factors identifiable by patient are bending backwards, bending forwards, recumbency and sitting.Previous workup: xray and MRI. MRI 10/14/16 showed disc herniation at the L5-S1.  Previous treatments: muscle relaxants, steroids, and narcotic medications. History of anxiety/ depression. Onset was approximately 3 years ago, stable that time.  He denies current suicidal and homicidal plan or intent.  Risk factors: previous episode of depression Previous treatment includes cymbalta. He denies receiving any counseling or therapy. He is agreeable to therapy. He complains of the following side effects from the treatment: none. History of HTN. He does not check BP at home. He reports only taking chlorthalidone for BP. Cardiac symptoms palpitations. Patient denies chest pain, chest pressure/discomfort, lower extremity edema and syncope.  Cardiovascular risk factors: hypertension, male gender and sedentary lifestyle. Use of agents associated with hypertension: NSAIDS. History of target organ damage: none. Vertigo - Dizziness: c/o dizziness has been present for several months. The patient describes the symptoms as vertigo and lightheadedness. Symptoms are exacerbated by bright lights and looking down. The patient also complains of tinnitus popping. Patient denies aural pressure hearing loss.  He has not tried anything for symptoms.   Outpatient Medications Prior to Visit  Medication Sig Dispense Refill  . acetaminophen (TYLENOL) 500 MG  tablet Take 1,000 mg by mouth every 6 (six) hours as needed for mild pain, moderate pain, fever or headache.    . ibuprofen (ADVIL,MOTRIN) 800 MG tablet Take 800 mg by mouth every 8 (eight) hours as needed for mild pain.    . Menthol-Camphor (ICY HOT ADVANCED RELIEF) 16-11 % CREA Apply 1 application topically daily as needed (pain).    . Multiple Vitamin (MULTIVITAMIN WITH MINERALS) TABS tablet Take 1 tablet by mouth daily.    . ondansetron (ZOFRAN) 4 MG tablet Take 1 tablet (4 mg total) by mouth every 6 (six) hours. (Patient not taking: Reported on 10/14/2016) 12 tablet 0  . Acetaminophen-Codeine (TYLENOL/CODEINE #3) 300-30 MG tablet Take 1 tablet by mouth every 6 (six) hours as needed for pain. 40 tablet 0  . chlorthalidone (HYGROTON) 25 MG tablet Take 1 tablet (25 mg total) by mouth daily. 30 tablet 3  . diclofenac (VOLTAREN) 75 MG EC tablet Take 1 tablet (75 mg total) by mouth 2 (two) times daily. (Patient not taking: Reported on 10/14/2016) 60 tablet 0  . DULoxetine (CYMBALTA) 20 MG capsule Take 1 capsule (20 mg total) by mouth daily. 30 capsule 2  . methocarbamol (ROBAXIN) 500 MG tablet Take 1 tablet (500 mg total) by mouth every 8 (eight) hours as needed for muscle spasms. 30 tablet 0  . naproxen (NAPROSYN) 500 MG tablet TAKE ONE TABLET BY MOUTH TWICE A DAY WITH MEALS FOR 10 DAYS. THEN TAKE ONCE TABLET BY MOUTH DAILY WITH MEALS AS NEEDED. 60 tablet 0  . oxyCODONE-acetaminophen (PERCOCET) 5-325 MG tablet Take 1 tablet by mouth every 6 (six) hours as needed for severe pain. 6 tablet 0   No facility-administered medications prior to visit.     ROS Review of Systems  Constitutional: Negative.  Eyes: Negative.   Respiratory: Negative.   Cardiovascular: Negative.   Gastrointestinal: Negative.   Genitourinary: Negative.   Musculoskeletal: Positive for back pain and myalgias.  Skin: Negative.   Neurological:       Paresthesias   Psychiatric/Behavioral: Positive for dysphoric mood. Negative  for suicidal ideas.    Objective:  BP 132/81 (BP Location: Left Arm, Patient Position: Sitting, Cuff Size: Normal)   Pulse 70   Temp 98.3 F (36.8 C) (Oral)   Resp 18   Ht 5\' 11"  (1.803 m)   Wt 258 lb (117 kg)   SpO2 95%   BMI 35.98 kg/m   BP/Weight 12/25/2016 11/05/2016 10/14/2016  Systolic BP 132 121 157  Diastolic BP 81 82 85  Wt. (Lbs) 258 254.8 260  BMI 35.98 35.54 36.26    Physical Exam  Constitutional: He is oriented to person, place, and time. He appears well-developed and well-nourished.  HENT:  Head: Normocephalic and atraumatic.  Right Ear: External ear normal.  Left Ear: External ear normal.  Nose: Nose normal.  Mouth/Throat: Oropharynx is clear and moist.  Eyes: Conjunctivae and EOM are normal. Pupils are equal, round, and reactive to light.  Neck: No JVD present.  Cardiovascular: Normal rate, regular rhythm, normal heart sounds and intact distal pulses.  Pulmonary/Chest: Effort normal and breath sounds normal.  Abdominal: Soft. Bowel sounds are normal. There is no tenderness.  Musculoskeletal:       Lumbar back: He exhibits decreased range of motion and pain (positive straight leg test to RLE).  Neurological: He is alert and oriented to person, place, and time. He has normal reflexes.  Skin: Skin is warm and dry.  Psychiatric: He is slowed. He exhibits a depressed mood. He expresses no homicidal and no suicidal ideation. He expresses no suicidal plans and no homicidal plans.  Nursing note and vitals reviewed.  Assessment & Plan:   1. Chronic bilateral low back pain with right-sided sciatica  - diclofenac (VOLTAREN) 75 MG EC tablet; Take 1 tablet (75 mg total) by mouth 2 (two) times daily as needed.  Dispense: 60 tablet; Refill: 1 - methocarbamol (ROBAXIN) 500 MG tablet; Take 1 tablet (500 mg total) by mouth every 8 (eight) hours as needed for muscle spasms.  Dispense: 30 tablet; Refill: 0 - Acetaminophen-Codeine (TYLENOL/CODEINE #3) 300-30 MG tablet; Take 1  tablet by mouth every 6 (six) hours as needed for pain.  Dispense: 40 tablet; Refill: 0 - Ambulatory referral to Orthopedics  2. History of herniated intervertebral disc  - Ambulatory referral to Orthopedics  3. Vertigo Will refer to audiology. - TSH - CBC  4. Palpitations Pt.has history of anxiety, will r/o other causes of symptoms. - EKG 12-Lead - TSH - CBC - Basic Metabolic Panel - Ambulatory referral to Cardiology  5. Anxiety and depression  - Ambulatory referral to Psychiatry - DULoxetine (CYMBALTA) 20 MG capsule; Take 1 capsule (20 mg total) by mouth daily.  Dispense: 30 capsule; Refill: 2  6. Essential hypertension, benign  - chlorthalidone (HYGROTON) 25 MG tablet; Take 1 tablet (25 mg total) by mouth daily.  Dispense: 90 tablet; Refill: 1  7. Abnormal ECG  - Ambulatory referral to Cardiology     Follow-up: Return in about 3 months (around 03/27/2017), or if symptoms worsen or fail to improve.   Lizbeth BarkMandesia R Hairston FNP

## 2017-01-18 ENCOUNTER — Encounter (HOSPITAL_COMMUNITY): Payer: Self-pay

## 2017-01-18 ENCOUNTER — Emergency Department (HOSPITAL_COMMUNITY)
Admission: EM | Admit: 2017-01-18 | Discharge: 2017-01-18 | Disposition: A | Discharge status: Home / Self Care | Payer: Self-pay | Attending: Emergency Medicine | Admitting: Emergency Medicine

## 2017-01-18 ENCOUNTER — Other Ambulatory Visit: Payer: Self-pay

## 2017-01-18 DIAGNOSIS — R197 Diarrhea, unspecified: Secondary | ICD-10-CM | POA: Insufficient documentation

## 2017-01-18 DIAGNOSIS — R112 Nausea with vomiting, unspecified: Secondary | ICD-10-CM

## 2017-01-18 DIAGNOSIS — I10 Essential (primary) hypertension: Secondary | ICD-10-CM | POA: Insufficient documentation

## 2017-01-18 DIAGNOSIS — Z87891 Personal history of nicotine dependence: Secondary | ICD-10-CM | POA: Insufficient documentation

## 2017-01-18 DIAGNOSIS — Z79899 Other long term (current) drug therapy: Secondary | ICD-10-CM | POA: Insufficient documentation

## 2017-01-18 DIAGNOSIS — J45909 Unspecified asthma, uncomplicated: Secondary | ICD-10-CM | POA: Insufficient documentation

## 2017-01-18 LAB — URINALYSIS, ROUTINE W REFLEX MICROSCOPIC
Bilirubin Urine: NEGATIVE
GLUCOSE, UA: NEGATIVE mg/dL
HGB URINE DIPSTICK: NEGATIVE
Ketones, ur: NEGATIVE mg/dL
Leukocytes, UA: NEGATIVE
Nitrite: NEGATIVE
PH: 6 (ref 5.0–8.0)
Protein, ur: NEGATIVE mg/dL
SPECIFIC GRAVITY, URINE: 1.014 (ref 1.005–1.030)

## 2017-01-18 LAB — COMPREHENSIVE METABOLIC PANEL
ALK PHOS: 46 U/L (ref 38–126)
ALT: 45 U/L (ref 17–63)
ANION GAP: 9 (ref 5–15)
AST: 30 U/L (ref 15–41)
Albumin: 4.3 g/dL (ref 3.5–5.0)
BILIRUBIN TOTAL: 0.8 mg/dL (ref 0.3–1.2)
BUN: 20 mg/dL (ref 6–20)
CHLORIDE: 105 mmol/L (ref 101–111)
CO2: 26 mmol/L (ref 22–32)
Calcium: 9.8 mg/dL (ref 8.9–10.3)
Creatinine, Ser: 0.89 mg/dL (ref 0.61–1.24)
GFR calc non Af Amer: >60 mL/min (ref >60)
GLUCOSE: 114 mg/dL — AB (ref 65–99)
POTASSIUM: 4 mmol/L (ref 3.5–5.1)
Sodium: 140 mmol/L (ref 135–145)
Total Protein: 7.6 g/dL (ref 6.5–8.1)

## 2017-01-18 LAB — CBC
HEMATOCRIT: 44.6 % (ref 39.0–52.0)
HEMOGLOBIN: 15.9 g/dL (ref 13.0–17.0)
MCH: 32.4 pg (ref 26.0–34.0)
MCHC: 35.7 g/dL (ref 30.0–36.0)
MCV: 91 fL (ref 78.0–100.0)
Platelets: 210 10*3/uL (ref 150–400)
RBC: 4.9 MIL/uL (ref 4.22–5.81)
RDW: 13.5 % (ref 11.5–15.5)
WBC: 6.3 10*3/uL (ref 4.0–10.5)

## 2017-01-18 LAB — LIPASE, BLOOD: Lipase: 40 U/L (ref 11–51)

## 2017-01-18 MED ORDER — ONDANSETRON HCL 4 MG/2ML IJ SOLN: 4.0000 mg | Freq: Once | INTRAMUSCULAR | Status: DC

## 2017-01-18 MED ORDER — ONDANSETRON 4 MG PO TBDP
4.0000 mg | ORAL_TABLET | Freq: Once | ORAL | Status: AC
  Administered 2017-01-18: 4 mg via ORAL
  Filled 2017-01-18: qty 1

## 2017-01-18 MED ORDER — SODIUM CHLORIDE 0.9 % IV BOLUS (SEPSIS): 1000.0000 mL | Freq: Once | INTRAVENOUS | Status: DC

## 2017-01-18 MED ORDER — ONDANSETRON 4 MG PO TBDP: 4.0000 mg | ORAL_TABLET | Freq: Three times a day (TID) | ORAL | 0 refills | Status: DC | PRN

## 2017-01-18 NOTE — ED Triage Notes (Signed)
Patient  C/o N/V/D x 2 days.

## 2017-01-18 NOTE — ED Notes (Signed)
Patient refusing IV access. Will ask EDP about PO medication and will straight stick for labs.

## 2017-01-18 NOTE — ED Notes (Signed)
ED Provider at bedside. 

## 2017-01-18 NOTE — Discharge Instructions (Signed)

## 2017-01-18 NOTE — ED Provider Notes (Signed)
[facility]-EMERGENCY DEPT Provider Note   CSN: [id number] Arrival date & time: 01/18/17  0802     History   Chief Complaint Chief Complaint  Patient presents with  . Nausea  . Diarrhea  . Emesis    HPI  Blood pressure (!) 153/81, pulse 86, temperature (!) 97.4 F (36.3 C), temperature source Oral, resp. rate 16, height 5\' 11"  (1.803 m), weight 117.9 kg (260 lb), SpO2 100 %.  Osie BondBrandon Charles Scheib is a 31 y.o. male with past medical history significant for asthma and hypertension complaining of nausea with single episode of emesis or what he considers to be reflux.  The started 2 days ago.  And associated with slightly looser than normal stool with no melena, hematochezia.  He denies any abdominal pain, fever, chills, sick contacts.  Good appetite.  Past Medical History:  Diagnosis Date  . Anxiety   . Asthma   . GERD (gastroesophageal reflux disease)   . Hypertension     Patient Active Problem List   Diagnosis Date Noted  . History of herniated intervertebral disc 12/25/2016  . Palpitations 12/25/2016  . Anxiety and depression 12/06/2015  . Chronic bilateral low back pain without sciatica 12/06/2015  . Essential hypertension, benign 07/08/2013    History reviewed. No pertinent surgical history.     Home Medications    Prior to Admission medications   Medication Sig Start Date End Date Taking? Authorizing Provider  acetaminophen (TYLENOL) 500 MG tablet Take 1,000 mg by mouth every 6 (six) hours as needed for mild pain, moderate pain, fever or headache.    [provider]  Acetaminophen-Codeine (TYLENOL/CODEINE #3) 300-30 MG tablet Take 1 tablet by mouth every 6 (six) hours as needed for pain. 12/25/16   Lizbeth BarkHairston, Mandesia R, FNP  chlorthalidone (HYGROTON) 25 MG tablet Take 1 tablet (25 mg total) by mouth daily. 12/25/16   Lizbeth BarkHairston, Mandesia R, FNP  diclofenac (VOLTAREN) 75 MG EC tablet Take 1 tablet (75 mg total) by mouth 2 (two) times  daily as needed. 12/25/16   Lizbeth BarkHairston, Mandesia R, FNP  DULoxetine (CYMBALTA) 20 MG capsule Take 1 capsule (20 mg total) by mouth daily. 12/25/16   Lizbeth BarkHairston, Mandesia R, FNP  ibuprofen (ADVIL,MOTRIN) 800 MG tablet Take 800 mg by mouth every 8 (eight) hours as needed for mild pain.    [provider]  Menthol-Camphor (ICY HOT ADVANCED RELIEF) 16-11 % CREA Apply 1 application topically daily as needed (pain).    [provider]  methocarbamol (ROBAXIN) 500 MG tablet Take 1 tablet (500 mg total) by mouth every 8 (eight) hours as needed for muscle spasms. 12/25/16   Lizbeth BarkHairston, Mandesia R, FNP  Multiple Vitamin (MULTIVITAMIN WITH MINERALS) TABS tablet Take 1 tablet by mouth daily.    [provider]  ondansetron (ZOFRAN ODT) 4 MG disintegrating tablet Take 1 tablet (4 mg total) by mouth every 8 (eight) hours as needed for nausea or vomiting. 01/18/17   Emilyrose Darrah, Joni ReiningNicole, PA-C  ondansetron (ZOFRAN) 4 MG tablet Take 1 tablet (4 mg total) by mouth every 6 (six) hours. Patient not taking: Reported on 10/14/2016 08/26/16   Sharlene DoryWendling, Nicholas Paul, DO    Family History Family History  Problem Relation Age of Onset  . Diabetes Maternal Grandmother   . Hypertension Maternal Grandmother   . Diabetes Maternal Grandfather   . Diabetes Paternal Grandmother   . Heart disease Paternal Grandmother   . Diabetes Paternal Grandfather     Social History Social History  Tobacco Use  . Smoking status: Former Games developer  . Smokeless tobacco: Never Used  Substance Use Topics  . Alcohol use: No  . Drug use: No     Allergies   Prednisone   Review of Systems Review of Systems  A complete review of systems was obtained and all systems are negative except as noted in the HPI and PMH.   Physical Exam Updated Vital Signs BP (!) 153/81 (BP Location: Left Arm)   Pulse 86   Temp (!) 97.4 F (36.3 C) (Oral)   Resp 16   Ht 5\' 11"  (1.803 m)   Wt 117.9 kg (260 lb)   SpO2 100%   BMI  36.26 kg/m   Physical Exam  Constitutional: He is oriented to person, place, and time. He appears well-developed and well-nourished. No distress.  HENT:  Head: Normocephalic and atraumatic.  Mouth/Throat: Oropharynx is clear and moist.  Eyes: Conjunctivae and EOM are normal. Pupils are equal, round, and reactive to light.  Neck: Normal range of motion.  Cardiovascular: Normal rate, regular rhythm and intact distal pulses.  Pulmonary/Chest: Effort normal and breath sounds normal.  Abdominal: Soft. He exhibits no distension and no mass. There is no tenderness. There is no rebound and no guarding. No hernia.  Musculoskeletal: Normal range of motion.  Neurological: He is alert and oriented to person, place, and time.  Skin: He is not diaphoretic.  Psychiatric: He has a normal mood and affect.  Nursing note and vitals reviewed.    ED Treatments / Results  Labs (all labs ordered are listed, but only abnormal results are displayed) Labs Reviewed  COMPREHENSIVE METABOLIC PANEL - Abnormal; Notable for the following components:      Result Value   Glucose, Bld 114 (*)    All other components within normal limits  LIPASE, BLOOD  CBC  URINALYSIS, ROUTINE W REFLEX MICROSCOPIC    EKG  EKG Interpretation None       Radiology No results found.  Procedures Procedures (including critical care time)  Medications Ordered in ED Medications  ondansetron (ZOFRAN-ODT) disintegrating tablet 4 mg (4 mg Oral Given 01/18/17 0831)     Initial Impression / Assessment and Plan / ED Course  I have reviewed the triage vital signs and the nursing notes.  Pertinent labs & imaging results that were available during my care of the patient were reviewed by me and considered in my medical decision making (see chart for details).     Vitals:   01/18/17 0807 01/18/17 0808  BP: (!) 153/81   Pulse: 86   Resp: 16   Temp: (!) 97.4 F (36.3 C)   TempSrc: Oral   SpO2: 100%   Weight:  117.9 kg  (260 lb)  Height:  5\' 11"  (1.803 m)    Medications  ondansetron (ZOFRAN-ODT) disintegrating tablet 4 mg (4 mg Oral Given 01/18/17 0831)    Macarius Ruark is 31 y.o. male presenting with nausea and diarrhea 2 days ago.  Vital signs reassuring, abdominal exam is benign, blood work with no significant abnormality, patient tolerating p.o.'s.  Repeat abdominal exam benign, patient given Zofran to go home with, extensive discussion of return precautions and patient verbalized understanding and teach back technique.  Evaluation does not show pathology that would require ongoing emergent intervention or inpatient treatment. Pt is hemodynamically stable and mentating appropriately. Discussed findings and plan with patient/guardian, who agrees with care plan. All questions answered. Return precautions discussed and outpatient follow up given.  Final Clinical Impressions(s) / ED Diagnoses   Final diagnoses:  Nausea vomiting and diarrhea    ED Discharge Orders        Ordered    ondansetron (ZOFRAN ODT) 4 MG disintegrating tablet  Every 8 hours PRN     01/18/17 0902       Brenin Heidelberger, Mardella Laymanicole, PA-C 01/18/17 [id number]    Gwyneth SproutPlunkett, Whitney, MD 01/18/17 2206

## 2017-01-23 ENCOUNTER — Ambulatory Visit (INDEPENDENT_AMBULATORY_CARE_PROVIDER_SITE_OTHER): Payer: Self-pay | Admitting: Cardiology

## 2017-01-23 ENCOUNTER — Encounter: Payer: Self-pay | Admitting: Cardiology

## 2017-01-23 VITALS — Ht 71.0 in | Wt 258.0 lb

## 2017-01-23 DIAGNOSIS — R079 Chest pain, unspecified: Secondary | ICD-10-CM | POA: Insufficient documentation

## 2017-01-23 DIAGNOSIS — I10 Essential (primary) hypertension: Secondary | ICD-10-CM

## 2017-01-23 DIAGNOSIS — R002 Palpitations: Secondary | ICD-10-CM

## 2017-01-23 NOTE — Progress Notes (Signed)
PCP: Lizbeth Bark, FNP  Clinic Note: Chief Complaint  Patient presents with  . New Patient (Initial Visit)    HTN, Palpitations.  . Headache  . Shortness of Breath  . Edema    Swell when he does not take his medicine.  . Chest Pain    tightness.    HPI: Walter Howell is a 31 y.o. male with a PMH notable mostly for hypertension and asthma as well as recent episodes of lumbar back pain (disc herniation L5-S1) who presents today for emergency room follow-up for high blood pressure and palpitations -- referred at the request of Lizbeth Bark, FNP --in response to recent visit to the ER, PCP referred for cardiology evaluation  He has known history of hypertension and asthma.  Walter Howell was recently seen by PCP 12/25/2016 - concern for EKG findings with c/o palpitations  Recent Hospitalizations:   ER visit November 23 with nausea vomiting diarrhea.  Noted to have hypertension with blood pressure 153/80 mmHg  He noted some reflux related nausea and emesis.  But his nausea symptoms began roughly 2 days prior to the ER visit. -->  He was treated with Zofran  Studies Personally Reviewed - (if available, images/films reviewed: From Epic Chart or Care Everywhere)  None  Interval History: [name] is a very pleasant young man who is here actually with his aunt who helps provide some of his issues.  He is here basically for what seems like difficult to control blood pressure as well as having some palpitations.  He had a recent ER visit with some nausea and vomiting as well as diarrhea that was thought to be due to a viral illness.  He was noted to be hypertensive at that time.  (Blood pressure similar to what it currently is).    [name] presents today stating that the nausea and vomiting is improved but he is now noticing symptoms of irregular heartbeats and palpitations.  These are not happening all the time, they happen pretty much rarely but they are often  associated with some lightheadedness and dizzy feels a fluttering sensation.  Of late they have been happening once or twice a week, but in the past that have been much less frequent.  He has not had any in the last couple days. He notes some sharp types of pain in his chest that are usually right along the midline and consent has been exacerbated by some certain activities.  Not really associated with shortness of breath. He says he takes chlorthalidone only for his blood pressure control.  This also seems to have helped his swelling that he was having prior to starting medications.  Despite having some palpitations and chest discomfort, he denies any real exertional chest tightness or pressure.  No significant dyspnea.  With palpitations he has not had any syncope or near syncope symptoms.  No TIA or amaurosis fugax of this. Despite having some mild swelling controlled by chlorthalidone he denies any PND or orthopnea, but does note that his GERD seems to be worsened by lying down.  No melena, hematochezia, hematuria, or epstaxis. No claudication.  ROS: A comprehensive was performed. Review of Systems  HENT: Negative for nosebleeds.   Respiratory: Negative for cough, sputum production and wheezing.   Cardiovascular: Positive for palpitations.  Gastrointestinal: Positive for heartburn. Negative for blood in stool, melena and vomiting (No further vomiting).       Per HPI  Genitourinary: Negative for hematuria.  Musculoskeletal: Positive for  back pain.  Neurological: Positive for dizziness and headaches.  Psychiatric/Behavioral: The patient is nervous/anxious.   All other systems reviewed and are negative.  --No longer taking Robaxin for his back pain, and never did fill the Voltaren gel or pill prescription.  I have reviewed and (if needed) personally updated the patient's problem list, medications, allergies, past medical and surgical history, social and family history.   Past Medical  History:  Diagnosis Date  . Anxiety   . Asthma   . GERD (gastroesophageal reflux disease)   . Hypertension    History reviewed. No pertinent surgical history. - NO PSH  Current Meds  Medication Sig  . acetaminophen (TYLENOL) 500 MG tablet Take 1,000 mg by mouth every 6 (six) hours as needed for mild pain, moderate pain, fever or headache.  . Acetaminophen-Codeine (TYLENOL/CODEINE #3) 300-30 MG tablet Take 1 tablet by mouth every 6 (six) hours as needed for pain.  . chlorthalidone (HYGROTON) 25 MG tablet Take 1 tablet (25 mg total) by mouth daily.  . diclofenac (VOLTAREN) 75 MG EC tablet Take 1 tablet (75 mg total) by mouth 2 (two) times daily as needed.  . DULoxetine (CYMBALTA) 20 MG capsule Take 1 capsule (20 mg total) by mouth daily.  Marland Kitchen ibuprofen (ADVIL,MOTRIN) 800 MG tablet Take 800 mg by mouth every 8 (eight) hours as needed for mild pain.  . Menthol-Camphor (ICY HOT ADVANCED RELIEF) 16-11 % CREA Apply 1 application topically daily as needed (pain).  . methocarbamol (ROBAXIN) 500 MG tablet Take 1 tablet (500 mg total) by mouth every 8 (eight) hours as needed for muscle spasms.  . Multiple Vitamin (MULTIVITAMIN WITH MINERALS) TABS tablet Take 1 tablet by mouth daily.  . ondansetron (ZOFRAN ODT) 4 MG disintegrating tablet Take 1 tablet (4 mg total) by mouth every 8 (eight) hours as needed for nausea or vomiting.  . ondansetron (ZOFRAN) 4 MG tablet Take 1 tablet (4 mg total) by mouth every 6 (six) hours.    Allergies  Allergen Reactions  . Prednisone     "makes him feel weird"    Social History   Socioeconomic History  . Marital status: Single    Spouse name: None  . Number of children: None  . Years of education: None  . Highest education level: None  Social Needs  . Financial resource strain: None  . Food insecurity - worry: None  . Food insecurity - inability: None  . Transportation needs - medical: None  . Transportation needs - non-medical: None  Occupational History   . None  Tobacco Use  . Smoking status: Former Games developer  . Smokeless tobacco: Never Used  Substance and Sexual Activity  . Alcohol use: No  . Drug use: No  . Sexual activity: None  Other Topics Concern  . None  Social History Narrative  . None    family history includes Diabetes in his maternal grandfather, maternal grandmother, paternal grandfather, and paternal grandmother; Heart disease in his paternal grandmother; Hypertension in his maternal grandmother.  Wt Readings from Last 3 Encounters:  01/23/17 258 lb (117 kg)  01/18/17 260 lb (117.9 kg)  12/25/16 258 lb (117 kg)    PHYSICAL EXAM Ht 5\' 11"  (1.803 m)   Wt 258 lb (117 kg)   BMI 35.98 kg/m  Physical Exam  Constitutional: He is oriented to person, place, and time. He appears well-developed and well-nourished. No distress.  Moderately obese.  Well-groomed.  Quite anxious  HENT:  Head: Normocephalic and atraumatic.  Mouth/Throat: No  oropharyngeal exudate.  Eyes: Conjunctivae and EOM are normal. Pupils are equal, round, and reactive to light. No scleral icterus.  Neck: Normal range of motion. Neck supple. No hepatojugular reflux and no JVD present. Carotid bruit is not present. No tracheal deviation present. No thyromegaly present.  Cardiovascular: Regular rhythm and intact distal pulses.  Occasional extrasystoles (Difficult to tell because he is borderline tachycardic) are present. PMI is not displaced (Unable to palpate). Exam reveals no gallop, no distant heart sounds and no friction rub.  No murmur heard. Pulmonary/Chest: Effort normal and breath sounds normal. No respiratory distress. He has no wheezes. He has no rales.  Abdominal: Soft. Bowel sounds are normal. He exhibits no distension. There is no tenderness. There is no rebound.  Musculoskeletal: Normal range of motion. He exhibits no edema or deformity.  Neurological: He is alert and oriented to person, place, and time. No cranial nerve deficit.  Skin: Skin is  warm and dry.  Psychiatric: He has a normal mood and affect.  Seems a little bit slow to respond to questions.  Does not answer questions competently.  He seemed much more confident when his aunt came in, but then deferred to her.  Nursing note and vitals reviewed.    Adult ECG Report  Rate: 98 ;  Rhythm: normal sinus rhythm and normal axis, intervals & durations No ischemic ST -T wave changes.; low Voltage in precordial leads.  Narrative Interpretation: normal EKG (borderline S Tachy) -- Very anxious   Other studies Reviewed: Additional studies/ records that were reviewed today include:  Recent Labs:   No results found for: CHOL, HDL, LDLCALC, LDLDIRECT, TRIG, CHOLHDL  Lab Results  Component Value Date   TSH 1.290 12/25/2016   Lab Results  Component Value Date   CREATININE 0.89 01/18/2017   BUN 20 01/18/2017   NA 140 01/18/2017   K 4.0 01/18/2017   CL 105 01/18/2017   CO2 26 01/18/2017   Lab Results  Component Value Date   WBC 6.3 01/18/2017   HGB 15.9 01/18/2017   HCT 44.6 01/18/2017   MCV 91.0 01/18/2017   PLT 210 01/18/2017    ASSESSMENT / PLAN: Problem List Items Addressed This Visit    Chest pain with low risk for cardiac etiology    The chest discomfort he describing is a sharp somewhat localized pain along the sternal border that is probably musculoskeletal in nature, however he is quite anxious, and I would like to alleviate him of concerns.  He is quite doting aunt is very worried and I think based on their level of anxiety symptoms evaluation is warranted.  We talked about options and I think probably a GXT is the best option given his relatively normal EKG.  Plan: GXT      Relevant Orders   EKG 12-Lead (Completed)   EXERCISE TOLERANCE TEST (ETT)   Essential hypertension, benign (Chronic)    Apparently he had been on Norvasc all with 10 mg daily but was converted to chlorthalidone 25 because of swelling.  Blood pressure still not quite at goal.  I suspect  that he probably will need an ARB added to this. Has been followed by primary care for this.  We will reevaluate his blood pressures and follow-up visit and determine if he is indeed having significant palpitations, perhaps a beta-blocker such as carvedilol may be an decent option as well.  Obviously lifestyle modification such as reducing salt intake, diet and exercise are also crucial.  Palpitations - Primary (Chronic)    Symptoms are somewhat atypical and unusual.  I would like to know what I am treating, but based on the frequency and not sure monitor recapture any episodes.  We talked about scheduled for event monitor versus him using an iPhone app called AliveCor --> with this he has the ability to monitor his palpitations regardless of 1 any occur and is not burdened with wearing a monitor long-term.      Relevant Orders   EKG 12-Lead (Completed)   EXERCISE TOLERANCE TEST (ETT)      Current medicines are reviewed at length with the patient today. (+/- concerns) n/a The following changes have been made: n/a  Patient Instructions  No Medication changes  Recommend  You  Download "Alivecor"  - An app that  Can capture your heart rate   Keep well hydrated -  Drink up to 8-10 glasses of water a day  Urine should be urine clear.     Testing  [address] Your physician has requested that you have an exercise tolerance test. For further information please visit [email or web address] Please also follow instruction sheet, as given.    Your physician recommends that you schedule a follow-[facility] test is completed.     Studies Ordered:   Orders Placed This Encounter  Procedures  . EXERCISE TOLERANCE TEST (ETT)  . EKG 12-Lead      Bryan Lemmaavid Harding, M.D., M.S. Interventional Cardiologist   Pager # [phone] Phone # [phone] [address] [place], [place] [id number]

## 2017-01-23 NOTE — Patient Instructions (Signed)
No Medication changes  Recommend  You  Download "Alivecor"  - An app that  Can capture your heart rate   Keep well hydrated -  Drink up to 8-10 glasses of water a day  Urine should be urine clear.     Testing  [address] Your physician has requested that you have an exercise tolerance test. For further information please visit [email or web address] Please also follow instruction sheet, as given.    Your physician recommends that you schedule a [facility] test is completed.

## 2017-01-25 ENCOUNTER — Encounter: Payer: Self-pay | Admitting: Cardiology

## 2017-01-25 NOTE — Assessment & Plan Note (Signed)
The chest discomfort he describing is a sharp somewhat localized pain along the sternal border that is probably musculoskeletal in nature, however he is quite anxious, and I would like to alleviate him of concerns.  He is quite doting aunt is very worried and I think based on their level of anxiety symptoms evaluation is warranted.  We talked about options and I think probably a GXT is the best option given his relatively normal EKG.  Plan: GXT

## 2017-01-25 NOTE — Assessment & Plan Note (Addendum)
Apparently he had been on Norvasc all with 10 mg daily but was converted to chlorthalidone 25 because of swelling.  Blood pressure still not quite at goal.  I suspect that he probably will need an ARB added to this. Has been followed by primary care for this.  We will reevaluate his blood pressures and follow-up visit and determine if he is indeed having significant palpitations, perhaps a beta-blocker such as carvedilol may be an decent option as well.  Obviously lifestyle modification such as reducing salt intake, diet and exercise are also crucial.

## 2017-01-25 NOTE — Assessment & Plan Note (Signed)
Symptoms are somewhat atypical and unusual.  I would like to know what I am treating, but based on the frequency and not sure monitor recapture any episodes.  We talked about scheduled for event monitor versus him using an iPhone app called AliveCor --> with this he has the ability to monitor his palpitations regardless of 1 any occur and is not burdened with wearing a monitor long-term.

## 2017-01-29 ENCOUNTER — Telehealth (HOSPITAL_COMMUNITY): Payer: Self-pay

## 2017-01-29 NOTE — Telephone Encounter (Signed)
Encounter complete. 

## 2017-01-31 ENCOUNTER — Inpatient Hospital Stay (HOSPITAL_COMMUNITY)
Admission: RE | Admit: 2017-01-31 | Payer: Self-pay | Source: Ambulatory Visit | Attending: Cardiology | Admitting: Cardiology

## 2017-02-07 ENCOUNTER — Telehealth: Payer: Self-pay | Admitting: *Deleted

## 2017-02-07 NOTE — Telephone Encounter (Signed)
LEFT MESSAGE TO CALL BACK -- WOULD LIKE TO RESCHEDULE FOLLOW UP APPOINTMENT 02/12/17 TO ANOTHER DAY . DUE TO PATIENT RESCHEDULING GXT TEST  UNTIL 02/14/17.TES NEEDS TO BE COMPLETED BEFORE F/U APPOINTMENT.

## 2017-02-12 ENCOUNTER — Telehealth (HOSPITAL_COMMUNITY): Payer: Self-pay | Admitting: *Deleted

## 2017-02-12 ENCOUNTER — Ambulatory Visit: Payer: Self-pay | Admitting: Cardiology

## 2017-02-12 NOTE — Telephone Encounter (Signed)
Close encounter 

## 2017-02-14 ENCOUNTER — Ambulatory Visit (HOSPITAL_COMMUNITY)
Admission: RE | Admit: 2017-02-14 | Payer: Self-pay | Source: Ambulatory Visit | Attending: Cardiology | Admitting: Cardiology

## 2017-02-28 MED FILL — ?CHLORTHALIDONE 25 MG TABLE: 25 | 30 days supply | Qty: 30 | Fill #1

## 2017-03-06 ENCOUNTER — Ambulatory Visit (HOSPITAL_COMMUNITY)
Admission: RE | Admit: 2017-03-06 | Payer: Self-pay | Source: Ambulatory Visit | Attending: Cardiology | Admitting: Cardiology

## 2017-03-07 ENCOUNTER — Encounter: Payer: Self-pay | Admitting: *Deleted

## 2017-03-07 ENCOUNTER — Ambulatory Visit: Payer: Self-pay | Admitting: Cardiology

## 2017-03-16 ENCOUNTER — Encounter (HOSPITAL_COMMUNITY): Payer: Self-pay | Admitting: *Deleted

## 2017-03-16 ENCOUNTER — Emergency Department (HOSPITAL_COMMUNITY)
Admission: EM | Admit: 2017-03-16 | Discharge: 2017-03-16 | Disposition: A | Discharge status: Home / Self Care | Payer: Self-pay | Attending: Emergency Medicine | Admitting: Emergency Medicine

## 2017-03-16 ENCOUNTER — Other Ambulatory Visit: Payer: Self-pay

## 2017-03-16 DIAGNOSIS — R110 Nausea: Secondary | ICD-10-CM | POA: Insufficient documentation

## 2017-03-16 DIAGNOSIS — Z5321 Procedure and treatment not carried out due to patient leaving prior to being seen by health care provider: Secondary | ICD-10-CM | POA: Insufficient documentation

## 2017-03-16 NOTE — ED Notes (Signed)
Pt returned labels to Registration and left.

## 2017-03-16 NOTE — ED Triage Notes (Signed)
Pt states people in his house hold have had an abd virus. He has developed nausea, generalized discomfort, back pain. Symptoms started yesterday early morning

## 2017-03-21 ENCOUNTER — Telehealth (HOSPITAL_COMMUNITY): Payer: Self-pay

## 2017-03-21 NOTE — Telephone Encounter (Signed)
Encounter complete. 

## 2017-03-26 ENCOUNTER — Ambulatory Visit (HOSPITAL_COMMUNITY): Admission: RE | Admit: 2017-03-26 | Payer: Self-pay | Source: Ambulatory Visit

## 2017-03-27 ENCOUNTER — Ambulatory Visit: Payer: Self-pay | Admitting: Family Medicine

## 2017-03-28 ENCOUNTER — Other Ambulatory Visit: Payer: Self-pay | Admitting: Family Medicine

## 2017-03-28 ENCOUNTER — Encounter (INDEPENDENT_AMBULATORY_CARE_PROVIDER_SITE_OTHER): Payer: Self-pay

## 2017-03-28 ENCOUNTER — Ambulatory Visit: Payer: Self-pay | Attending: Family Medicine | Admitting: Family Medicine

## 2017-03-28 ENCOUNTER — Encounter: Payer: Self-pay | Admitting: Family Medicine

## 2017-03-28 VITALS — BP 155/97 | HR 71 | Temp 98.4°F | Resp 16 | Wt 264.4 lb

## 2017-03-28 DIAGNOSIS — I10 Essential (primary) hypertension: Secondary | ICD-10-CM | POA: Insufficient documentation

## 2017-03-28 DIAGNOSIS — F329 Major depressive disorder, single episode, unspecified: Secondary | ICD-10-CM

## 2017-03-28 DIAGNOSIS — F419 Anxiety disorder, unspecified: Secondary | ICD-10-CM | POA: Insufficient documentation

## 2017-03-28 DIAGNOSIS — Z113 Encounter for screening for infections with a predominantly sexual mode of transmission: Secondary | ICD-10-CM | POA: Insufficient documentation

## 2017-03-28 DIAGNOSIS — H938X3 Other specified disorders of ear, bilateral: Secondary | ICD-10-CM

## 2017-03-28 DIAGNOSIS — F32A Depression, unspecified: Secondary | ICD-10-CM

## 2017-03-28 DIAGNOSIS — B009 Herpesviral infection, unspecified: Secondary | ICD-10-CM | POA: Insufficient documentation

## 2017-03-28 DIAGNOSIS — G8929 Other chronic pain: Secondary | ICD-10-CM | POA: Insufficient documentation

## 2017-03-28 DIAGNOSIS — Z79899 Other long term (current) drug therapy: Secondary | ICD-10-CM | POA: Insufficient documentation

## 2017-03-28 DIAGNOSIS — M5127 Other intervertebral disc displacement, lumbosacral region: Secondary | ICD-10-CM | POA: Insufficient documentation

## 2017-03-28 DIAGNOSIS — M5441 Lumbago with sciatica, right side: Secondary | ICD-10-CM | POA: Insufficient documentation

## 2017-03-28 MED ORDER — METHOCARBAMOL 500 MG PO TABS: 500.0000 mg | ORAL_TABLET | Freq: Three times a day (TID) | ORAL | 0 refills | Status: DC | PRN

## 2017-03-28 MED ORDER — DULOXETINE HCL 20 MG PO CPEP: 40.0000 mg | ORAL_CAPSULE | Freq: Every day | ORAL | 2 refills | Status: DC

## 2017-03-28 MED ORDER — DULOXETINE HCL 40 MG PO CPEP: 40.0000 mg | ORAL_CAPSULE | Freq: Every day | ORAL | 2 refills | Status: DC

## 2017-03-28 MED ORDER — LORATADINE 10 MG PO TABS: 10.0000 mg | ORAL_TABLET | Freq: Every day | ORAL | 5 refills | Status: DC

## 2017-03-28 MED ORDER — CHLORTHALIDONE 25 MG PO TABS: 25.0000 mg | ORAL_TABLET | Freq: Every day | ORAL | 2 refills | Status: DC

## 2017-03-28 MED ORDER — TRAMADOL HCL 50 MG PO TABS: 50.0000 mg | ORAL_TABLET | Freq: Three times a day (TID) | ORAL | 0 refills | Status: DC | PRN

## 2017-03-28 MED ORDER — DICLOFENAC SODIUM 75 MG PO TBEC: 75.0000 mg | DELAYED_RELEASE_TABLET | Freq: Two times a day (BID) | ORAL | 0 refills | Status: DC | PRN

## 2017-03-28 MED ORDER — AMLODIPINE BESYLATE 5 MG PO TABS: 5.0000 mg | ORAL_TABLET | Freq: Every day | ORAL | 2 refills | Status: DC

## 2017-03-28 NOTE — Progress Notes (Signed)
Subjective:  Patient ID: Walter Howell, male    DOB: 1986/02/15  Age: 32 y.o. MRN: [id number]  CC: Follow-up and STD testing   HPI Walter Howell presents for chronic low back pain .Location  lumbar (aching and throbbing in character; 9/10 in severity) and paresthesias in RLE. Initial inciting event: none. Patient does reports history of heavy lifting.  Alleviating factors identifiable by patient are none. Exacerbating factors identifiable by patient are bending backwards, bending forwards, recumbency and sitting.Previous workup: xray and MRI. MRI 10/14/16 showed disc herniation at the L5-S1 and referral. He not following up with applying for orange program. He report only taking tylenol #3 for pain with only mild relief of symptoms. He report not ever taking diclofenac or robaxin for symptoms. History of anxiety/ depression. Onset was approximately 3 years ago.  He denies current suicidal and homicidal plan or intent.  Risk factors: previous episode of depression. Triggers include social situations, sob, chest pains, and tremors. He reports receiving therapy from counselor at church. Previous treatment includes cymbalta. He complains of the following side effects from the treatment: none. History of HTN. He does not check BP at home. He reports only taking chlorthalidone for BP. Cardiac symptoms palpitations. Patient denies chest pain, chest pressure/discomfort, lower extremity edema and syncope.  Cardiovascular risk factors: hypertension, male gender and sedentary lifestyle. Use of agents associated with hypertension: NSAIDS. History of target organ damage: none. He complaints of lightheadedness. Symptoms are exacerbated by lying down. Associated symptoms include popping. Patient denies aural pressure hearing loss.  He has not tried anything for symptoms. STD testing. He denies any penile lesions, discharge, or dysuria. He reports 1 sexual partner within the last 3 months.       Outpatient Medications Prior to Visit  Medication Sig Dispense Refill  . Multiple Vitamin (MULTIVITAMIN WITH MINERALS) TABS tablet Take 1 tablet by mouth daily.    . Acetaminophen-Codeine (TYLENOL/CODEINE #3) 300-30 MG tablet Take 1 tablet by mouth every 6 (six) hours as needed for pain. 40 tablet 0  . chlorthalidone (HYGROTON) 25 MG tablet Take 1 tablet (25 mg total) by mouth daily. 90 tablet 1  . DULoxetine (CYMBALTA) 20 MG capsule Take 1 capsule (20 mg total) by mouth daily. 30 capsule 2  . methocarbamol (ROBAXIN) 500 MG tablet Take 1 tablet (500 mg total) by mouth every 8 (eight) hours as needed for muscle spasms. 30 tablet 0  . acetaminophen (TYLENOL) 500 MG tablet Take 1,000 mg by mouth every 6 (six) hours as needed for mild pain, moderate pain, fever or headache.    . ibuprofen (ADVIL,MOTRIN) 800 MG tablet Take 800 mg by mouth every 8 (eight) hours as needed for mild pain.    . Menthol-Camphor (ICY HOT ADVANCED RELIEF) 16-11 % CREA Apply 1 application topically daily as needed (pain).    . ondansetron (ZOFRAN ODT) 4 MG disintegrating tablet Take 1 tablet (4 mg total) by mouth every 8 (eight) hours as needed for nausea or vomiting. (Patient not taking: Reported on 03/28/2017) 5 tablet 0  . ondansetron (ZOFRAN) 4 MG tablet Take 1 tablet (4 mg total) by mouth every 6 (six) hours. (Patient not taking: Reported on 03/28/2017) 12 tablet 0  . diclofenac (VOLTAREN) 75 MG EC tablet Take 1 tablet (75 mg total) by mouth 2 (two) times daily as needed. (Patient not taking: Reported on 03/28/2017) 60 tablet 1   No facility-administered medications prior to visit.     ROS Review of Systems  Constitutional: Negative.  HENT: Negative for hearing loss.        Ear problem  Respiratory: Negative.   Cardiovascular: Negative.   Musculoskeletal: Positive for back pain.  Neurological: Positive for dizziness.  Psychiatric/Behavioral: Positive for dysphoric mood. Negative for suicidal ideas. The  patient is nervous/anxious.         Objective:  BP (!) 155/97   Pulse 71   Temp 98.4 F (36.9 C) (Oral)   Resp 16   Wt 264 lb 6.4 oz (119.9 kg)   SpO2 95%   BMI 36.88 kg/m   BP/Weight 03/28/2017 03/16/2017 01/23/2017  Systolic BP 155 156 -  Diastolic BP 97 93 -  Wt. (Lbs) 264.4 250 258  BMI 36.88 34.87 35.98     Physical Exam  Constitutional: He appears well-developed and well-nourished.  HENT:  Head: Normocephalic and atraumatic.  Right Ear: External ear normal. No drainage or tenderness. Tympanic membrane is not perforated and not erythematous. No decreased hearing is noted.  Left Ear: External ear normal. No drainage or tenderness. Tympanic membrane is not perforated and not erythematous. No decreased hearing is noted.  Nose: Nose normal.  Mouth/Throat: Oropharynx is clear and moist.  Eyes: Conjunctivae are normal. Pupils are equal, round, and reactive to light.  Neck: No JVD present.  Cardiovascular: Normal rate, regular rhythm, normal heart sounds and intact distal pulses.  Pulmonary/Chest: Effort normal and breath sounds normal.  Abdominal: Soft. Bowel sounds are normal.  Skin: Skin is warm and dry.  Nursing note and vitals reviewed.    Assessment & Plan:   1. Chronic bilateral low back pain with right-sided sciatica Encouraged to apply for orange card and discount programs. - diclofenac (VOLTAREN) 75 MG EC tablet; Take 1 tablet (75 mg total) by mouth 2 (two) times daily as needed for mild pain or moderate pain. Take with a meal.  Dispense: 60 tablet; Refill: 0 - methocarbamol (ROBAXIN) 500 MG tablet; Take 1 tablet (500 mg total) by mouth every 8 (eight) hours as needed for muscle spasms.  Dispense: 40 tablet; Refill: 0 - Ambulatory referral to Orthopedics - traMADol (ULTRAM) 50 MG tablet; Take 1 tablet (50 mg total) by mouth every 8 (eight) hours as needed for severe pain.  Dispense: 30 tablet; Refill: 0  2. Anxiety and depression Increase Cymbalta dosage.   He declines speaking with LCSW. He is agreeable to receiving LCSW card for follow up prn.  3. Popping of both ears  - loratadine (CLARITIN) 10 MG tablet; Take 1 tablet (10 mg total) by mouth daily.  Dispense: 30 tablet; Refill: 5  4. Screening for STDs (sexually transmitted diseases)  - HEP, RPR, HIV Panel - HSV(herpes simplex vrs) 1+2 ab-IgG - Urine cytology ancillary only - HSV-2 IgG Supplemental Test  5. Essential hypertension, benign Schedule BP recheck in 2 weeks with nurse. If BP is greater than 90/60 (MAP 65 or greater) but not less than 130/80 may increase dose of amlodipine to 10 mg QD and recheck in another 2 weeks.  Follow up with PCP in 3 months.  - amLODipine (NORVASC) 5 MG tablet; Take 1 tablet (5 mg total) by mouth daily.  Dispense: 30 tablet; Refill: 2 - chlorthalidone (HYGROTON) 25 MG tablet; Take 1 tablet (25 mg total) by mouth daily.  Dispense: 30 tablet; Refill: 2   Follow-up: Return in about 2 weeks (around 04/11/2017) for BP check with [facility].   Lizbeth Bark FNP

## 2017-03-28 NOTE — Patient Instructions (Signed)
Apply for orange card and discount programs. Ask about process for you to obtain medical records.  Chronic Back Pain When back pain lasts longer than 3 months, it is called chronic back pain.The cause of your back pain may not be known. Some common causes include:  Wear and tear (degenerative disease) of the bones, ligaments, or disks in your back.  Inflammation and stiffness in your back (arthritis).  People who have chronic back pain often go through certain periods in which the pain is more intense (flare-ups). Many people can learn to manage the pain with home care. Follow these instructions at home: Pay attention to any changes in your symptoms. Take these actions to help with your pain: Activity  Avoid bending and activities that make the problem worse.  Do not sit or stand in one place for long periods of time.  Take brief periods of rest throughout the day. This will reduce your pain. Resting in a lying or standing position is usually better than sitting to rest.  When you are resting for longer periods, mix in some mild activity or stretching between periods of rest. This will help to prevent stiffness and pain.  Get regular exercise. Ask your health care provider what activities are safe for you.  Do not lift anything that is heavier than 10 lb (4.5 kg). Always use proper lifting technique, which includes: ? Bending your knees. ? Keeping the load close to your body. ? Avoiding twisting. Managing pain  If directed, apply ice to the painful area. Your health care provider may recommend applying ice during the first 24-48 hours after a flare-up begins. ? Put ice in a plastic bag. ? Place a towel between your skin and the bag. ? Leave the ice on for 20 minutes, 2-3 times per day.  After icing, apply heat to the affected area as often as told by your health care provider. Use the heat source that your health care provider recommends, such as a moist heat pack or a heating  pad. ? Place a towel between your skin and the heat source. ? Leave the heat on for 20-30 minutes. ? Remove the heat if your skin turns bright red. This is especially important if you are unable to feel pain, heat, or cold. You may have a greater risk of getting burned.  Try soaking in a warm tub.  Take over-the-counter and prescription medicines only as told by your health care provider.  Keep all follow-up visits as told by your health care provider. This is important. Contact a health care provider if:  You have pain that is not relieved with rest or medicine. Get help right away if:  You have weakness or numbness in one or both of your legs or feet.  You have trouble controlling your bladder or your bowels.  You have nausea or vomiting.  You have pain in your abdomen.  You have shortness of breath or you faint. This information is not intended to replace advice given to you by your health care provider. Make sure you discuss any questions you have with your health care provider. Document Released: 03/22/2004 Document Revised: 06/23/2015 Document Reviewed: 08/02/2014 Elsevier Interactive Patient Education  2018 [employer]

## 2017-03-29 LAB — URINE CYTOLOGY ANCILLARY ONLY
Chlamydia: NEGATIVE
NEISSERIA GONORRHEA: NEGATIVE
Trichomonas: NEGATIVE

## 2017-03-29 LAB — HEP, RPR, HIV PANEL
HIV SCREEN 4TH GENERATION: NONREACTIVE
Hepatitis B Surface Ag: NEGATIVE
RPR Ser Ql: NONREACTIVE

## 2017-03-29 LAB — HSV(HERPES SIMPLEX VRS) I + II AB-IGG
HSV 1 GLYCOPROTEIN G AB, IGG: 28.5 {index} — AB (ref 0.00–0.90)
HSV 2 IgG, Type Spec: 3.96 index — ABNORMAL HIGH (ref 0.00–0.90)

## 2017-03-29 LAB — HSV-2 IGG SUPPLEMENTAL TEST: HSV-2 IgG Supplemental Test: POSITIVE — AB

## 2017-04-01 LAB — URINE CYTOLOGY ANCILLARY ONLY
Bacterial vaginitis: NEGATIVE
Candida vaginitis: NEGATIVE

## 2017-04-02 ENCOUNTER — Telehealth (INDEPENDENT_AMBULATORY_CARE_PROVIDER_SITE_OTHER): Payer: Self-pay | Admitting: *Deleted

## 2017-04-02 ENCOUNTER — Telehealth: Payer: Self-pay

## 2017-04-02 NOTE — Telephone Encounter (Signed)
-----   Message from Lizbeth BarkMandesia R Hairston, FNP sent at 04/02/2017  8:44 AM EST ----- BV and yeast negative

## 2017-04-02 NOTE — Telephone Encounter (Signed)
CMA called patient regarding lab results and PCP advising.  Patient understood.  

## 2017-04-02 NOTE — Telephone Encounter (Signed)
-----   Message from Lizbeth BarkMandesia R Hairston, FNP sent at 04/01/2017  1:13 PM EST ----- HIV, Hepatitis B, and syphilis are all negative. Gonorrhea, Chlamydia,, and Trichomonas were all negative. Herpes type 1 which is primarily responsible for cold sores is positive. When you have cold sores do not kiss anyone, share utensils, or have oral sex. Herpes type 2 which is primarily responsible for genital herpes is positive. This indicates you have been exposed to the herpes virus in the past. Herpes cannot be cured. To decrease the risk of spreading of herpes use a condom every time you have sex, do not have sex when you have symptoms of an outbreak (blisters, sores, tingling). Medications are available to help manage outbreaks.

## 2017-04-02 NOTE — Telephone Encounter (Signed)
Medical Assistant left message on patient's home and cell voicemail. Voicemail states to give a call back [facility] [place] [facility] at [phone]. !!Please inform patient of all STI's being negative including yeast, BV. 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 Please refrain from sharing utensils, oral sex or kissing while experiencing outbreaks. Medications can be provided if outbreaks occur!!!

## 2017-04-03 ENCOUNTER — Telehealth (HOSPITAL_COMMUNITY): Payer: Self-pay | Admitting: Cardiology

## 2017-04-03 NOTE — Telephone Encounter (Signed)
Patient has no-showed 03/26/17 03/06/17 02/14/17 and cancelled on 01/31/17..RG  He will be removed from the workqueue.

## 2017-04-09 ENCOUNTER — Ambulatory Visit: Payer: Self-pay | Attending: Family Medicine | Admitting: Audiology

## 2017-04-18 ENCOUNTER — Ambulatory Visit (INDEPENDENT_AMBULATORY_CARE_PROVIDER_SITE_OTHER): Payer: Self-pay | Admitting: Surgery

## 2017-04-26 MED FILL — ?AMLODIPINE BESYLATE 5 MG T: 5 MG | 30 days supply | Qty: 30 | Fill #0

## 2017-04-26 MED FILL — ?CHLORTHALIDONE 25 MG TABLE: 25 | 30 days supply | Qty: 30 | Fill #0

## 2017-05-02 ENCOUNTER — Ambulatory Visit (INDEPENDENT_AMBULATORY_CARE_PROVIDER_SITE_OTHER): Payer: Self-pay | Admitting: Orthopedic Surgery

## 2017-06-19 MED FILL — ?CHLORTHALIDONE 25 MG TABLE: 25 | 30 days supply | Qty: 30 | Fill #1

## 2017-07-23 ENCOUNTER — Ambulatory Visit: Payer: Self-pay | Attending: Internal Medicine | Admitting: Internal Medicine

## 2017-07-23 ENCOUNTER — Encounter: Payer: Self-pay | Admitting: Internal Medicine

## 2017-07-23 VITALS — BP 140/92 | HR 68 | Temp 98.4°F | Resp 16 | Ht 71.0 in | Wt 261.8 lb

## 2017-07-23 DIAGNOSIS — Z87891 Personal history of nicotine dependence: Secondary | ICD-10-CM | POA: Insufficient documentation

## 2017-07-23 DIAGNOSIS — H8110 Benign paroxysmal vertigo, unspecified ear: Secondary | ICD-10-CM | POA: Insufficient documentation

## 2017-07-23 DIAGNOSIS — K219 Gastro-esophageal reflux disease without esophagitis: Secondary | ICD-10-CM | POA: Insufficient documentation

## 2017-07-23 DIAGNOSIS — M545 Low back pain, unspecified: Secondary | ICD-10-CM

## 2017-07-23 DIAGNOSIS — Z833 Family history of diabetes mellitus: Secondary | ICD-10-CM | POA: Insufficient documentation

## 2017-07-23 DIAGNOSIS — Z79899 Other long term (current) drug therapy: Secondary | ICD-10-CM | POA: Insufficient documentation

## 2017-07-23 DIAGNOSIS — F32A Depression, unspecified: Secondary | ICD-10-CM

## 2017-07-23 DIAGNOSIS — F329 Major depressive disorder, single episode, unspecified: Secondary | ICD-10-CM | POA: Insufficient documentation

## 2017-07-23 DIAGNOSIS — I10 Essential (primary) hypertension: Secondary | ICD-10-CM | POA: Insufficient documentation

## 2017-07-23 DIAGNOSIS — M26609 Unspecified temporomandibular joint disorder, unspecified side: Secondary | ICD-10-CM | POA: Insufficient documentation

## 2017-07-23 DIAGNOSIS — Z8249 Family history of ischemic heart disease and other diseases of the circulatory system: Secondary | ICD-10-CM | POA: Insufficient documentation

## 2017-07-23 DIAGNOSIS — G8929 Other chronic pain: Secondary | ICD-10-CM | POA: Insufficient documentation

## 2017-07-23 DIAGNOSIS — F419 Anxiety disorder, unspecified: Secondary | ICD-10-CM | POA: Insufficient documentation

## 2017-07-23 DIAGNOSIS — Z888 Allergy status to other drugs, medicaments and biological substances status: Secondary | ICD-10-CM | POA: Insufficient documentation

## 2017-07-23 DIAGNOSIS — Z791 Long term (current) use of non-steroidal anti-inflammatories (NSAID): Secondary | ICD-10-CM | POA: Insufficient documentation

## 2017-07-23 MED ORDER — OMEPRAZOLE 20 MG PO CPDR: 20.0000 mg | DELAYED_RELEASE_CAPSULE | Freq: Every day | ORAL | 3 refills | Status: DC

## 2017-07-23 MED ORDER — AMLODIPINE BESYLATE 5 MG PO TABS: 5.0000 mg | ORAL_TABLET | Freq: Every day | ORAL | 2 refills | Status: DC

## 2017-07-23 MED ORDER — HYDROXYZINE HCL 25 MG PO TABS: 25.0000 mg | ORAL_TABLET | Freq: Two times a day (BID) | ORAL | 1 refills | Status: DC | PRN

## 2017-07-23 MED FILL — hydrOXYzine HCL 25 MG TABS: 25 | 15 days supply | Qty: 30 | Fill #0

## 2017-07-23 MED FILL — OMEPRAZOLE 20 MG CAP: 20 | 30 days supply | Qty: 30 | Fill #0

## 2017-07-23 MED FILL — AMLODIPINE BESYLATE 5 MG TA: 5 | 30 days supply | Qty: 30 | Fill #0

## 2017-07-23 NOTE — Progress Notes (Signed)
Patient ID: Walter Howell, male    DOB: 1986/01/13  MRN: [id number]  CC: re-establish and Hypertension   Subjective: Walter Howell is a 32 y.o. male who presents for chronic disease management and to establish with me as PCP.  Last PCP was Hairston.  His concerns today include:  Patient with history of HTN, anx/dep, chronic lower back pain  HTN: Compliant with chlorthalidone.  He is not taking Norvasc.  His understanding from last visit with PCP was that the amlodipine was being stopped and he was to take chlorthalidone.  Based on last PCPs note he was supposed to take both the amlodipine and the chlorthalidone -limits salt in foods.  Incorporates  Fruits and veggies in diet -No SOB/CP.  +shooting pains in RT arm when he picks up something heavy  Anx/dep: feels the Cymbalta not covering him sufficiently for anxiety.   Feels anxious when around people, when stress or if something bad happens to him  C/o dizzy spells intermittently x few mths -Occurs with head movements and when rolling in bed.  Episodes last several seconds.  He denies any hearing loss.  He sometimes has ringing in the ears. -No diplopia or dysarthria.  No gait disturbance -He looked up vertigo online and saw YouTube videos of how to do Epley maneuver.he has tried doing that at home with the assistance of his aunt.  He found it to be somewhat helpful.  However the way he describes to me what he was doing, does not sound as though it was being done correctly  C/o hearing popping in ears from the TMJ jts when he bites down.  No pain over the TMJ joints with eating/chewing  Patient Active Problem List   Diagnosis Date Noted  . Chest pain with low risk for cardiac etiology 01/23/2017  . History of herniated intervertebral disc 12/25/2016  . Palpitations 12/25/2016  . Anxiety and depression 12/06/2015  . Chronic bilateral low back pain without sciatica 12/06/2015  . Essential hypertension, benign 07/08/2013      Current Outpatient Medications on File Prior to Visit  Medication Sig Dispense Refill  . chlorthalidone (HYGROTON) 25 MG tablet Take 1 tablet (25 mg total) by mouth daily. 30 tablet 2  . DULoxetine (CYMBALTA) 20 MG capsule Take 2 capsules (40 mg total) by mouth daily. 60 capsule 2  . ibuprofen (ADVIL,MOTRIN) 800 MG tablet Take 800 mg by mouth every 8 (eight) hours as needed for mild pain.    Marland Kitchen loratadine (CLARITIN) 10 MG tablet Take 1 tablet (10 mg total) by mouth daily. 30 tablet 5  . acetaminophen (TYLENOL) 500 MG tablet Take 1,000 mg by mouth every 6 (six) hours as needed for mild pain, moderate pain, fever or headache.    . Menthol-Camphor (ICY HOT ADVANCED RELIEF) 16-11 % CREA Apply 1 application topically daily as needed (pain).    . Multiple Vitamin (MULTIVITAMIN WITH MINERALS) TABS tablet Take 1 tablet by mouth daily.     No current facility-administered medications on file prior to visit.     Allergies  Allergen Reactions  . Prednisone     "makes him feel weird"    Social History   Socioeconomic History  . Marital status: Single    Spouse name: Not on file  . Number of children: Not on file  . Years of education: Not on file  . Highest education level: Not on file  Occupational History  . Not on file  Social Needs  . Physicist, medical  strain: Not on file  . Food insecurity:    Worry: Not on file    Inability: Not on file  . Transportation needs:    Medical: Not on file    Non-medical: Not on file  Tobacco Use  . Smoking status: Former Games developer  . Smokeless tobacco: Never Used  Substance and Sexual Activity  . Alcohol use: No  . Drug use: No  . Sexual activity: Not on file  Lifestyle  . Physical activity:    Days per week: Not on file    Minutes per session: Not on file  . Stress: Not on file  Relationships  . Social connections:    Talks on phone: Not on file    Gets together: Not on file    Attends religious service: Not on file    Active member of  club or organization: Not on file    Attends meetings of clubs or organizations: Not on file    Relationship status: Not on file  . Intimate partner violence:    Fear of current or ex partner: Not on file    Emotionally abused: Not on file    Physically abused: Not on file    Forced sexual activity: Not on file  Other Topics Concern  . Not on file  Social History Narrative  . Not on file    Family History  Problem Relation Age of Onset  . Diabetes Maternal Grandmother   . Hypertension Maternal Grandmother   . Diabetes Maternal Grandfather   . Diabetes Paternal Grandmother   . Heart disease Paternal Grandmother   . Diabetes Paternal Grandfather     No past surgical history on file.  ROS: Review of Systems MSK: Give history of chronic low back issues.  Had MRI in August of last year that showed disc issue.  Referred to orthopedics but patient has no showed appointment 3 times.  Patient states he does not have insurance. PHYSICAL EXAM: BP (!) 140/92 (BP Location: Left Arm, Cuff Size: Large)   Pulse 68   Temp 98.4 F (36.9 C) (Oral)   Resp 16   Ht  (1.803 m)   Wt 261 lb 12.8 oz (118.8 kg)   SpO2 95%   BMI 36.51 kg/m   BP 140/90 RT, 140/92 LT sitting; Standing 140/100 Physical Exam  General appearance - alert, well appearing, and in no distress Mental status - normal mood, behavior, speech, dress, motor activity, and thought processes Ears - bilateral TM's and external ear canals normal Mouth - mucous membranes moist, pharynx normal without lesions.  No popping or clicking appreciated on palpation over the TMJ with opening and closing the mouth. Neck - supple, no significant adenopathy Chest - clear to auscultation, no wheezes, rales or rhonchi, symmetric air entry Heart - normal rate, regular rhythm, normal S1, S2, no murmurs, rubs, clicks or gallops Neurological - cranial nerves II through XII intact, motor and sensory grossly normal bilaterally.  Reflexes are  brisk.  Dix-Hallpike maneuver negative Extremities -no edema of the lower extremities   ASSESSMENT AND PLAN: 1. Essential hypertension Not at goal.  Advised patient to restart the amlodipine.  Continue chlorthalidone - amLODipine (NORVASC) 5 MG tablet; Take 1 tablet (5 mg total) by mouth daily.  Dispense: 30 tablet; Refill: 2  2. Anxiety and depression Discussed putting him on BuSpar but patient states that he does not like taking medicines every day.  He is agreeable to trying hydroxyzine as needed - hydrOXYzine (ATARAX/VISTARIL) 25 MG  tablet; Take 1 tablet (25 mg total) by mouth 2 (two) times daily as needed for anxiety.  Dispense: 30 tablet; Refill: 1  3. Benign paroxysmal positional vertigo, unspecified laterality Even though the Dix-Hallpike maneuver was negative his symptoms are consistent with BPV -Patient given a printed illustration of the Epley maneuver to try at home.   Advised to apply for the orange card/cone discount.  We can consider referring for vestibular training - CBC  4. TMJ (temporomandibular joint disorder) This does not cause pain or interfere with his eating.  Therefore will observe for now   5. Chronic bilateral low back pain without sciatica -Advised patient to apply for the orange card/cone discount card after which we can refer him to orthopedics.  6. Gastroesophageal reflux disease without esophagitis After I had left the room, patient reportedly told my nurse that he was having some problems with acid reflux and was taking Zantac without relief.  He requested something different.  Prescription sent to the pharmacy for omeprazole.  Patient was given the opportunity to ask questions.  Patient verbalized understanding of the plan and was able to repeat key elements of the plan.   Orders Placed This Encounter  Procedures  . CBC     Requested Prescriptions   Signed Prescriptions Disp Refills  . amLODipine (NORVASC) 5 MG tablet 30 tablet 2    Sig: Take  1 tablet (5 mg total) by mouth daily.  . hydrOXYzine (ATARAX/VISTARIL) 25 MG tablet 30 tablet 1    Sig: Take 1 tablet (25 mg total) by mouth 2 (two) times daily as needed for anxiety.  Marland Kitchen omeprazole (PRILOSEC) 20 MG capsule 30 capsule 3    Sig: Take 1 capsule (20 mg total) by mouth daily.    Return in about 6 weeks (around 09/03/2017).  Jonah Blue, MD, FACP

## 2017-07-23 NOTE — Patient Instructions (Signed)
Please get forms for the [facility] and fill them out.    Benign Positional Vertigo Vertigo is the feeling that you or your surroundings are moving when they are not. Benign positional vertigo is the most common form of vertigo. The cause of this condition is not serious (is benign). This condition is triggered by certain movements and positions (is positional). This condition can be dangerous if it occurs while you are doing something that could endanger you or others, such as driving. What are the causes? In many cases, the cause of this condition is not known. It may be caused by a disturbance in an area of the inner ear that helps your brain to sense movement and balance. This disturbance can be caused by a viral infection (labyrinthitis), head injury, or repetitive motion. What increases the risk? This condition is more likely to develop in:  Women.  People who are 58 years of age or older.  What are the signs or symptoms? Symptoms of this condition usually happen when you move your head or your eyes in different directions. Symptoms may start suddenly, and they usually last for less than a minute. Symptoms may include:  Loss of balance and falling.  Feeling like you are spinning or moving.  Feeling like your surroundings are spinning or moving.  Nausea and vomiting.  Blurred vision.  Dizziness.  Involuntary eye movement (nystagmus).  Symptoms can be mild and cause only slight annoyance, or they can be severe and interfere with daily life. Episodes of benign positional vertigo may return (recur) over time, and they may be triggered by certain movements. Symptoms may improve over time. How is this diagnosed? This condition is usually diagnosed by medical history and a physical exam of the head, neck, and ears. You may be referred to a health care provider who specializes in ear, nose, and throat (ENT) problems (otolaryngologist) or a provider who specializes  in disorders of the nervous system (neurologist). You may have additional testing, including:  MRI.  A CT scan.  Eye movement tests. Your health care provider may ask you to change positions quickly while he or she watches you for symptoms of benign positional vertigo, such as nystagmus. Eye movement may be tested with an electronystagmogram (ENG), caloric stimulation, the Dix-Hallpike test, or the roll test.  An electroencephalogram (EEG). This records electrical activity in your brain.  Hearing tests.  How is this treated? Usually, your health care provider will treat this by moving your head in specific positions to adjust your inner ear back to normal. Surgery may be needed in severe cases, but this is rare. In some cases, benign positional vertigo may resolve on its own in 2-4 weeks. Follow these instructions at home: Safety  Move slowly.Avoid sudden body or head movements.  Avoid driving.  Avoid operating heavy machinery.  Avoid doing any tasks that would be dangerous to you or others if a vertigo episode would occur.  If you have trouble walking or keeping your balance, try using a cane for stability. If you feel dizzy or unstable, sit down right away.  Return to your normal activities as told by your health care provider. Ask your health care provider what activities are safe for you. General instructions  Take over-the-counter and prescription medicines only as told by your health care provider.  Avoid certain positions or movements as told by your health care provider.  Drink enough fluid to keep your urine clear or pale yellow.  Keep all  follow-up visits as told by your health care provider. This is important. Contact a health care provider if:  You have a fever.  Your condition gets worse or you develop new symptoms.  Your family or friends notice any behavioral changes.  Your nausea or vomiting gets worse.  You have numbness or a "pins and needles"  sensation. Get help right away if:  You have difficulty speaking or moving.  You are always dizzy.  You faint.  You develop severe headaches.  You have weakness in your legs or arms.  You have changes in your hearing or vision.  You develop a stiff neck.  You develop sensitivity to light. This information is not intended to replace advice given to you by your health care provider. Make sure you discuss any questions you have with your health care provider. Document Released: 11/20/2005 Document Revised: 07/21/2015 Document Reviewed: 06/07/2014 Elsevier Interactive Patient Education  [employer].

## 2017-07-24 ENCOUNTER — Telehealth: Payer: Self-pay

## 2017-07-24 LAB — CBC
HEMOGLOBIN: 16.7 g/dL (ref 13.0–17.7)
Hematocrit: 47.6 % (ref 37.5–51.0)
MCH: 31.9 pg (ref 26.6–33.0)
MCHC: 35.1 g/dL (ref 31.5–35.7)
MCV: 91 fL (ref 79–97)
Platelets: 220 10*3/uL (ref 150–450)
RBC: 5.24 x10E6/uL (ref 4.14–5.80)
RDW: 15.2 % (ref 12.3–15.4)
WBC: 5.6 10*3/uL (ref 3.4–10.8)

## 2017-07-24 NOTE — Telephone Encounter (Signed)
Contacted pt to go over lab results pt didn't answer left a detailed vm informing pt of results and if he has any questions or concerns to give me a call   If pt calls back please give results: blood count is normal. No anemia.

## 2017-08-08 MED FILL — CHLORTHALIDONE 25 MG TAB: 25 | 30 days supply | Qty: 30 | Fill #2

## 2017-09-05 ENCOUNTER — Ambulatory Visit: Payer: Self-pay | Admitting: Internal Medicine

## 2017-09-12 MED FILL — CHLORTHALIDONE 25 MG TAB: 25 | 30 days supply | Qty: 30 | Fill #2

## 2017-09-12 MED FILL — hydrOXYzine HCL 25 MG TABS: 25 | 15 days supply | Qty: 30 | Fill #1

## 2017-10-17 ENCOUNTER — Other Ambulatory Visit: Payer: Self-pay | Admitting: Internal Medicine

## 2017-10-17 DIAGNOSIS — F419 Anxiety disorder, unspecified: Principal | ICD-10-CM

## 2017-10-17 DIAGNOSIS — F32A Depression, unspecified: Secondary | ICD-10-CM

## 2017-10-17 DIAGNOSIS — F329 Major depressive disorder, single episode, unspecified: Secondary | ICD-10-CM

## 2017-10-17 MED FILL — AMLODIPINE BESYLATE 5 MG TA: 5 | 30 days supply | Qty: 30 | Fill #1

## 2017-10-17 MED FILL — CHLORTHALIDONE 25 MG TAB: 25 | 30 days supply | Qty: 30 | Fill #1

## 2017-12-02 MED FILL — CHLORTHALIDONE 25 MG TAB: 25 | 30 days supply | Qty: 30 | Fill #2

## 2017-12-02 MED FILL — hydrOXYzine HCL 25 MG TABS: 25 | 30 days supply | Qty: 30 | Fill #0

## 2017-12-03 ENCOUNTER — Ambulatory Visit: Payer: Self-pay | Admitting: Internal Medicine

## 2017-12-20 ENCOUNTER — Ambulatory Visit: Payer: Self-pay | Admitting: Internal Medicine

## 2018-01-02 ENCOUNTER — Encounter: Payer: Self-pay | Admitting: Internal Medicine

## 2018-01-02 ENCOUNTER — Ambulatory Visit: Payer: Self-pay | Attending: Internal Medicine | Admitting: Internal Medicine

## 2018-01-02 VITALS — BP 151/97 | HR 54 | Temp 98.6°F | Resp 16 | Wt 254.8 lb

## 2018-01-02 DIAGNOSIS — F329 Major depressive disorder, single episode, unspecified: Secondary | ICD-10-CM | POA: Insufficient documentation

## 2018-01-02 DIAGNOSIS — M545 Low back pain: Secondary | ICD-10-CM | POA: Insufficient documentation

## 2018-01-02 DIAGNOSIS — I10 Essential (primary) hypertension: Secondary | ICD-10-CM

## 2018-01-02 DIAGNOSIS — H8110 Benign paroxysmal vertigo, unspecified ear: Secondary | ICD-10-CM | POA: Insufficient documentation

## 2018-01-02 DIAGNOSIS — Z888 Allergy status to other drugs, medicaments and biological substances status: Secondary | ICD-10-CM | POA: Insufficient documentation

## 2018-01-02 DIAGNOSIS — Z2821 Immunization not carried out because of patient refusal: Secondary | ICD-10-CM

## 2018-01-02 DIAGNOSIS — E669 Obesity, unspecified: Secondary | ICD-10-CM

## 2018-01-02 DIAGNOSIS — K219 Gastro-esophageal reflux disease without esophagitis: Secondary | ICD-10-CM | POA: Insufficient documentation

## 2018-01-02 DIAGNOSIS — Z8249 Family history of ischemic heart disease and other diseases of the circulatory system: Secondary | ICD-10-CM | POA: Insufficient documentation

## 2018-01-02 DIAGNOSIS — F419 Anxiety disorder, unspecified: Secondary | ICD-10-CM | POA: Insufficient documentation

## 2018-01-02 DIAGNOSIS — Z113 Encounter for screening for infections with a predominantly sexual mode of transmission: Secondary | ICD-10-CM

## 2018-01-02 DIAGNOSIS — G8929 Other chronic pain: Secondary | ICD-10-CM | POA: Insufficient documentation

## 2018-01-02 DIAGNOSIS — M26609 Unspecified temporomandibular joint disorder, unspecified side: Secondary | ICD-10-CM | POA: Insufficient documentation

## 2018-01-02 DIAGNOSIS — Z87891 Personal history of nicotine dependence: Secondary | ICD-10-CM | POA: Insufficient documentation

## 2018-01-02 DIAGNOSIS — Z6835 Body mass index (BMI) 35.0-35.9, adult: Secondary | ICD-10-CM

## 2018-01-02 DIAGNOSIS — Z79899 Other long term (current) drug therapy: Secondary | ICD-10-CM | POA: Insufficient documentation

## 2018-01-02 MED ORDER — AMLODIPINE BESYLATE 10 MG PO TABS: 10.0000 mg | ORAL_TABLET | Freq: Every day | ORAL | 6 refills | Status: DC

## 2018-01-02 MED ORDER — CHLORTHALIDONE 25 MG PO TABS: 25.0000 mg | ORAL_TABLET | Freq: Every day | ORAL | 6 refills | Status: DC

## 2018-01-02 MED FILL — CHLORTHALIDONE 25 MG TAB: 25 | 30 days supply | Qty: 30 | Fill #0

## 2018-01-02 MED FILL — AMLODIPINE BESYLATE 10 MG T: 10 | 30 days supply | Qty: 30 | Fill #0

## 2018-01-02 NOTE — Patient Instructions (Signed)
Your blood pressure is not at goal.  The goal is 130/80 or lower.  Increase amlodipine to 10 mg daily.  Try to check your blood pressure at least once a week.  Keep up the good work with healthy eating.  I have enclosed some information below regarding healthy eating habits.  Follow a Healthy Eating Plan - You can do it! Limit sugary drinks.  Avoid sodas, sweet tea, sport or energy drinks, or fruit drinks.  Drink water, lo-fat milk, or diet drinks. Limit snack foods.   Cut back on candy, cake, cookies, chips, ice cream.  These are a special treat, only in small amounts. Eat plenty of vegetables.  Especially dark green, red, and orange vegetables. Aim for at least 3 servings a day. More is better! Include fruit in your daily diet.  Whole fruit is much healthier than fruit juice! Limit "white" bread, "white" pasta, "white" rice.   Choose "100% whole grain" products, brown or wild rice. Avoid fatty meats. Try "Meatless Monday" and choose eggs or beans one day a week.  When eating meat, choose lean meats like chicken, [place], and fish.  Grill, broil, or bake meats instead of frying, and eat poultry without the skin. Eat less salt.  Avoid frozen pizzas, frozen dinners and salty foods.  Use seasonings other than salt in cooking.  This can help blood pressure and keep you from swelling Beer, wine and liquor have calories.  If you can safely drink alcohol, limit to 1 drink per day for women, 2 drinks for men

## 2018-01-02 NOTE — Progress Notes (Signed)
Patient ID: Walter Howell, male    DOB: August 15, 1985  MRN: [id number]  CC: Hypertension   Subjective: Walter Howell is a 32 y.o. male who presents for chronic ds management. His concerns today include:  Patient with history of HTN, anx/dep, chronic lower back pain  HTN:  Not checking BP.  He limits salt in the foods.  Compliant with meds that include amlodipine and chlorthalidone.  Obesity: Reports that he has made changes in his eating habits.  No fried foods.  Eat sweets like cookies about once a wk. Drinks mainly water.  Drinks diet soda about 3 x a wk.  Loss 10 lbs since 02/2017.  He has set goal to lose additional 30 lbs.   Request STD check.  No penile discgh. Last sexual activity was a few mths ago. Did have some dysuria several days ago but it resolved.   HM: declines flu shot  Patient Active Problem List   Diagnosis Date Noted  . Benign paroxysmal positional vertigo 07/23/2017  . TMJ (temporomandibular joint disorder) 07/23/2017  . Gastroesophageal reflux disease without esophagitis 07/23/2017  . Chest pain with low risk for cardiac etiology 01/23/2017  . History of herniated intervertebral disc 12/25/2016  . Palpitations 12/25/2016  . Anxiety and depression 12/06/2015  . Chronic bilateral low back pain without sciatica 12/06/2015  . Essential hypertension 07/08/2013     Current Outpatient Medications on File Prior to Visit  Medication Sig Dispense Refill  . acetaminophen (TYLENOL) 500 MG tablet Take 1,000 mg by mouth every 6 (six) hours as needed for mild pain, moderate pain, fever or headache.    . DULoxetine (CYMBALTA) 20 MG capsule Take 2 capsules (40 mg total) by mouth daily. 60 capsule 2  . hydrOXYzine (ATARAX/VISTARIL) 25 MG tablet TAKE ONE TABLET BY MOUTH TWO TIMES DAILY AS NEEDED FOR ANXIETY. MUST MAKE APPT FOR FURTHER REFILLS 30 tablet 0  . ibuprofen (ADVIL,MOTRIN) 800 MG tablet Take 800 mg by mouth every 8 (eight) hours as needed for mild pain.    .  Menthol-Camphor (ICY HOT ADVANCED RELIEF) 16-11 % CREA Apply 1 application topically daily as needed (pain).    . Multiple Vitamin (MULTIVITAMIN WITH MINERALS) TABS tablet Take 1 tablet by mouth daily.    Marland Kitchen omeprazole (PRILOSEC) 20 MG capsule Take 1 capsule (20 mg total) by mouth daily. 30 capsule 3   No current facility-administered medications on file prior to visit.     Allergies  Allergen Reactions  . Prednisone     "makes him feel weird"    Social History   Socioeconomic History  . Marital status: Single    Spouse name: Not on file  . Number of children: Not on file  . Years of education: Not on file  . Highest education level: Not on file  Occupational History  . Not on file  Social Needs  . Financial resource strain: Not on file  . Food insecurity:    Worry: Not on file    Inability: Not on file  . Transportation needs:    Medical: Not on file    Non-medical: Not on file  Tobacco Use  . Smoking status: Former Games developer  . Smokeless tobacco: Never Used  Substance and Sexual Activity  . Alcohol use: No  . Drug use: No  . Sexual activity: Not on file  Lifestyle  . Physical activity:    Days per week: Not on file    Minutes per session: Not on file  .  Stress: Not on file  Relationships  . Social connections:    Talks on phone: Not on file    Gets together: Not on file    Attends religious service: Not on file    Active member of club or organization: Not on file    Attends meetings of clubs or organizations: Not on file    Relationship status: Not on file  . Intimate partner violence:    Fear of current or ex partner: Not on file    Emotionally abused: Not on file    Physically abused: Not on file    Forced sexual activity: Not on file  Other Topics Concern  . Not on file  Social History Narrative  . Not on file    Family History  Problem Relation Age of Onset  . Diabetes Maternal Grandmother   . Hypertension Maternal Grandmother   . Diabetes Maternal  Grandfather   . Diabetes Paternal Grandmother   . Heart disease Paternal Grandmother   . Diabetes Paternal Grandfather     No past surgical history on file.  ROS: Review of Systems Neg except as above  PHYSICAL EXAM: BP (!) 151/97 (BP Location: Left Arm, Cuff Size: Large) Comment: recheck  Pulse (!) 54   Temp 98.6 F (37 C) (Oral)   Resp 16   Wt 254 lb 12.8 oz (115.6 kg)   SpO2 96%   BMI 35.54 kg/m   Wt Readings from Last 3 Encounters:  01/02/18 254 lb 12.8 oz (115.6 kg)  07/23/17 261 lb 12.8 oz (118.8 kg)  03/28/17 264 lb 6.4 oz (119.9 kg)  Repeat BP 142/90  Physical Exam  General appearance - alert, well appearing, young African-American male and in no distress Mental status - normal mood, behavior, speech, dress, motor activity, and thought processes Neck - supple, no significant adenopathy Chest - clear to auscultation, no wheezes, rales or rhonchi, symmetric air entry Heart - normal rate, regular rhythm, normal S1, S2, no murmurs, rubs, clicks or gallops Extremities - peripheral pulses normal, no pedal edema, no clubbing or cyanosis   ASSESSMENT AND PLAN:  1. Essential hypertension Not at goal.  Increase amlodipine to 10 mg daily.  Continue chlorthalidone. - amLODipine (NORVASC) 10 MG tablet; Take 1 tablet (10 mg total) by mouth daily.  Dispense: 30 tablet; Refill: 6 - chlorthalidone (HYGROTON) 25 MG tablet; Take 1 tablet (25 mg total) by mouth daily.  Dispense: 30 tablet; Refill: 6  2. Obesity (BMI 35.0-39.9 without comorbidity) Commended him on weight loss so far.  Encouraged him to keep up the good work.  Dietary counseling given.  Also encouraged regular exercise.  3. Screen for sexually transmitted diseases - Urine cytology ancillary only  4. Influenza vaccination declined  Patient was given the opportunity to ask questions.  Patient verbalized understanding of the plan and was able to repeat key elements of the plan.   No orders of the defined types  were placed in this encounter.    Requested Prescriptions   Signed Prescriptions Disp Refills  . amLODipine (NORVASC) 10 MG tablet 30 tablet 6    Sig: Take 1 tablet (10 mg total) by mouth daily.  . chlorthalidone (HYGROTON) 25 MG tablet 30 tablet 6    Sig: Take 1 tablet (25 mg total) by mouth daily.    Return in about 3 months (around 04/04/2018).  Jonah Blue, MD, FACP

## 2018-01-03 LAB — URINE CYTOLOGY ANCILLARY ONLY
CHLAMYDIA, DNA PROBE: NEGATIVE
NEISSERIA GONORRHEA: NEGATIVE
Trichomonas: NEGATIVE

## 2018-01-10 ENCOUNTER — Telehealth: Payer: Self-pay

## 2018-01-10 NOTE — Telephone Encounter (Signed)
Contacted pt to go over urine results pt is aware and doesn't have any questions or concerns  

## 2018-02-12 ENCOUNTER — Other Ambulatory Visit: Payer: Self-pay | Admitting: Internal Medicine

## 2018-02-12 DIAGNOSIS — F419 Anxiety disorder, unspecified: Principal | ICD-10-CM

## 2018-02-12 DIAGNOSIS — F329 Major depressive disorder, single episode, unspecified: Secondary | ICD-10-CM

## 2018-02-12 DIAGNOSIS — F32A Depression, unspecified: Secondary | ICD-10-CM

## 2018-02-12 MED FILL — CHLORTHALIDONE 25 MG TAB: 25 | 30 days supply | Qty: 30 | Fill #1

## 2018-02-14 MED FILL — hydrOXYzine HCL 25 MG TABS: 25 | 15 days supply | Qty: 30 | Fill #0

## 2018-03-18 ENCOUNTER — Other Ambulatory Visit: Payer: Self-pay | Admitting: Internal Medicine

## 2018-03-18 DIAGNOSIS — F32A Depression, unspecified: Secondary | ICD-10-CM

## 2018-03-18 DIAGNOSIS — F329 Major depressive disorder, single episode, unspecified: Secondary | ICD-10-CM

## 2018-03-18 DIAGNOSIS — F419 Anxiety disorder, unspecified: Principal | ICD-10-CM

## 2018-04-01 MED FILL — CHLORTHALIDONE 25 MG TAB: 25 | 30 days supply | Qty: 30 | Fill #2

## 2018-04-04 ENCOUNTER — Ambulatory Visit: Payer: Self-pay | Admitting: Internal Medicine

## 2018-05-12 ENCOUNTER — Encounter: Payer: Self-pay | Admitting: Internal Medicine

## 2018-05-12 ENCOUNTER — Other Ambulatory Visit: Payer: Self-pay

## 2018-05-12 ENCOUNTER — Ambulatory Visit: Payer: Self-pay | Attending: Internal Medicine | Admitting: Internal Medicine

## 2018-05-12 VITALS — BP 135/91 | HR 72 | Temp 98.5°F | Resp 16 | Wt 256.9 lb

## 2018-05-12 DIAGNOSIS — Z113 Encounter for screening for infections with a predominantly sexual mode of transmission: Secondary | ICD-10-CM

## 2018-05-12 DIAGNOSIS — M26629 Arthralgia of temporomandibular joint, unspecified side: Secondary | ICD-10-CM

## 2018-05-12 DIAGNOSIS — F419 Anxiety disorder, unspecified: Secondary | ICD-10-CM

## 2018-05-12 DIAGNOSIS — F32A Depression, unspecified: Secondary | ICD-10-CM

## 2018-05-12 DIAGNOSIS — I10 Essential (primary) hypertension: Secondary | ICD-10-CM

## 2018-05-12 DIAGNOSIS — F329 Major depressive disorder, single episode, unspecified: Secondary | ICD-10-CM

## 2018-05-12 DIAGNOSIS — H8110 Benign paroxysmal vertigo, unspecified ear: Secondary | ICD-10-CM

## 2018-05-12 MED ORDER — AMLODIPINE BESYLATE 5 MG PO TABS: 5.0000 mg | ORAL_TABLET | Freq: Every day | ORAL | 6 refills | Status: DC

## 2018-05-12 MED ORDER — HYDROXYZINE HCL 25 MG PO TABS: 25.0000 mg | ORAL_TABLET | Freq: Two times a day (BID) | ORAL | 1 refills | Status: DC | PRN

## 2018-05-12 MED ORDER — CHLORTHALIDONE 25 MG PO TABS: 25.0000 mg | ORAL_TABLET | Freq: Every day | ORAL | 6 refills | Status: DC

## 2018-05-12 NOTE — Patient Instructions (Addendum)
Please get and complete the forms for the orange card/cone discount card.  Decrease amlodipine to 5 mg daily.  Benign Positional Vertigo Vertigo is the feeling that you or your surroundings are moving when they are not. Benign positional vertigo is the most common form of vertigo. This is usually a harmless condition (benign). This condition is positional. This means that symptoms are triggered by certain movements and positions. This condition can be dangerous if it occurs while you are doing something that could cause harm to you or others. This includes activities such as driving or operating machinery. What are the causes? In many cases, the cause of this condition is not known. It may be caused by a disturbance in an area of the inner ear that helps your brain to sense movement and balance. This disturbance can be caused by:  Viral infection (labyrinthitis).  Head injury.  Repetitive motion, such as jumping, dancing, or running. What increases the risk? You are more likely to develop this condition if:  You are a woman.  You are 32 years of age or older. What are the signs or symptoms? Symptoms of this condition usually happen when you move your head or your eyes in different directions. Symptoms may start suddenly, and usually last for less than a minute. They include:  Loss of balance and falling.  Feeling like you are spinning or moving.  Feeling like your surroundings are spinning or moving.  Nausea and vomiting.  Blurred vision.  Dizziness.  Involuntary eye movement (nystagmus). Symptoms can be mild and cause only minor problems, or they can be severe and interfere with daily life. Episodes of benign positional vertigo may return (recur) over time. Symptoms may improve over time. How is this diagnosed? This condition may be diagnosed based on:  Your medical history.  Physical exam of the head, neck, and ears.  Tests, such as: ? MRI. ? CT scan. ? Eye movement  tests. Your health care provider may ask you to change positions quickly while he or she watches you for symptoms of benign positional vertigo, such as nystagmus. Eye movement may be tested with a variety of exams that are designed to evaluate or stimulate vertigo. ? An electroencephalogram (EEG). This records electrical activity in your brain. ? Hearing tests. You may be referred to a health care provider who specializes in ear, nose, and throat (ENT) problems (otolaryngologist) or a provider who specializes in disorders of the nervous system (neurologist). How is this treated?  This condition may be treated in a session in which your health care provider moves your head in specific positions to adjust your inner ear back to normal. Treatment for this condition may take several sessions. Surgery may be needed in severe cases, but this is rare. In some cases, benign positional vertigo may resolve on its own in 2-4 weeks. Follow these instructions at home: Safety  Move slowly. Avoid sudden body or head movements or certain positions, as told by your health care provider.  Avoid driving until your health care provider says it is safe for you to do so.  Avoid operating heavy machinery until your health care provider says it is safe for you to do so.  Avoid doing any tasks that would be dangerous to you or others if vertigo occurs.  If you have trouble walking or keeping your balance, try using a cane for stability. If you feel dizzy or unstable, sit down right away.  Return to your normal activities as told by  your health care provider. Ask your health care provider what activities are safe for you. General instructions  Take over-the-counter and prescription medicines only as told by your health care provider.  Drink enough fluid to keep your urine pale yellow.  Keep all follow-up visits as told by your health care provider. This is important. Contact a health care provider if:  You  have a fever.  Your condition gets worse or you develop new symptoms.  Your family or friends notice any behavioral changes.  You have nausea or vomiting that gets worse.  You have numbness or a "pins and needles" sensation. Get help right away if you:  Have difficulty speaking or moving.  Are always dizzy.  Faint.  Develop severe headaches.  Have weakness in your legs or arms.  Have changes in your hearing or vision.  Develop a stiff neck.  Develop sensitivity to light. Summary  Vertigo is the feeling that you or your surroundings are moving when they are not. Benign positional vertigo is the most common form of vertigo.  The cause of this condition is not known. It may be caused by a disturbance in an area of the inner ear that helps your brain to sense movement and balance.  Symptoms include loss of balance and falling, feeling that you or your surroundings are moving, nausea and vomiting, and blurred vision.  This condition can be diagnosed based on symptoms, physical exam, and other tests, such as MRI, CT scan, eye movement tests, and hearing tests.  Follow safety instructions as told by your health care provider. You will also be told when to contact your health care provider in case of problems. This information is not intended to replace advice given to you by your health care provider. Make sure you discuss any questions you have with your health care provider. Document Released: 11/20/2005 Document Revised: 07/24/2017 Document Reviewed: 07/24/2017 Elsevier Interactive Patient Education  2019 [employer]   Temporomandibular Joint Syndrome  Temporomandibular joint syndrome (TMJ syndrome) is a condition that causes pain in the temporomandibular joints. These joints are located near your ears and allow your jaw to open and close. For people with TMJ syndrome, chewing, biting, or other movements of the jaw can be difficult or painful. TMJ syndrome is often mild  and goes away within a few weeks. However, sometimes the condition becomes a long-term (chronic) problem. What are the causes? This condition may be caused by:  Grinding your teeth or clenching your jaw. Some people do this when they are under stress.  Arthritis.  Injury to the jaw.  Head or neck injury.  Teeth or dentures that are not aligned well. In some cases, the cause of TMJ syndrome may not be known. What are the signs or symptoms? The most common symptom of this condition is an aching pain on the side of the head in the area of the TMJ. Other symptoms may include:  Pain when moving your jaw, such as when chewing or biting.  Being unable to open your jaw all the way.  Making a clicking sound when you open your mouth.  Headache.  Earache.  Neck or shoulder pain. How is this diagnosed? This condition may be diagnosed based on:  Your symptoms and medical history.  A physical exam. Your health care provider may check the range of motion of your jaw.  Imaging tests, such as X-rays or an MRI. You may also need to see your dentist, who will determine if your teeth  and jaw are lined up correctly. How is this treated? TMJ syndrome often goes away on its own. If treatment is needed, the options may include:  Eating soft foods and applying ice or heat.  Medicines to relieve pain or inflammation.  Medicines or massage to relax the muscles.  A splint, bite plate, or mouthpiece to prevent teeth grinding or jaw clenching.  Relaxation techniques or counseling to help reduce stress.  A therapy for pain in which an electrical current is applied to the nerves through the skin (transcutaneous electrical nerve stimulation).  Acupuncture. This is sometimes helpful to relieve pain.  Jaw surgery. This is rarely needed. Follow these instructions at home:  Eating and drinking  Eat a soft diet if you are having trouble chewing.  Avoid foods that require a lot of chewing. Do  not chew gum. General instructions  Take over-the-counter and prescription medicines only as told by your health care provider.  If directed, put ice on the painful area. ? Put ice in a plastic bag. ? Place a towel between your skin and the bag. ? Leave the ice on for 20 minutes, 2-3 times a day.  Apply a warm, wet cloth (warm compress) to the painful area as directed.  Massage your jaw area and do any jaw stretching exercises as told by your health care provider.  If you were given a splint, bite plate, or mouthpiece, wear it as told by your health care provider.  Keep all follow-up visits as told by your health care provider. This is important. Contact a health care provider if:  You are having trouble eating.  You have new or worsening symptoms. Get help right away if:  Your jaw locks open or closed. Summary  Temporomandibular joint syndrome (TMJ syndrome) is a condition that causes pain in the temporomandibular joints. These joints are located near your ears and allow your jaw to open and close.  TMJ syndrome is often mild and goes away within a few weeks. However, sometimes the condition becomes a long-term (chronic) problem.  Symptoms include an aching pain on the side of the head in the area of the TMJ, pain when chewing or biting, and being unable to open your jaw all the way. You may also make a clicking sound when you open your mouth.  TMJ syndrome often goes away on its own. If treatment is needed, it may include medicines to relieve pain, reduce inflammation, or relax the muscles. A splint, bite plate, or mouthpiece may also be used to prevent teeth grinding or jaw clenching. This information is not intended to replace advice given to you by your health care provider. Make sure you discuss any questions you have with your health care provider. Document Released: 11/07/2000 Document Revised: 03/26/2017 Document Reviewed: 03/26/2017 Elsevier Interactive Patient Education   2019 [employer].

## 2018-05-12 NOTE — Progress Notes (Signed)
Pt states he has not been taking the amlodipine   Pt states when he takes both bp medicine he doesn't feel like himself   Pt states he thinks he has vertigo

## 2018-05-12 NOTE — Progress Notes (Signed)
Patient ID: Fon Rijo, male    DOB: [birth year]  MRN: [id number]  CC: Hypertension   Subjective: Walter Howell is a 33 y.o. male who presents for follow up visit His concerns today include:  Patient with history of HTN,anx/dep,chronic lower back pain  Request STI screening.  Currently not having any symptoms.  He denies any new sexual encounters.  States that he just wanted to be screened.  HTN:  Taking only chlorthalidone.  Not taking amlodipine.  No device to check BP Eating healthier - no sweets, fried foods, no sodas, drinking more water. Walking QOD for 1 hr.  Dizziness:-some intermittent dizziness when he looks up, and when he is about to lay down in bed.  Last several seconds.  Reported same symptoms in May of last year and was diagnosed with BPV.  Also notice popping in ears when chewing x 5-6 mths Little dec hearing and tinnitis a times  Patient Active Problem List   Diagnosis Date Noted  . Benign paroxysmal positional vertigo 07/23/2017  . TMJ (temporomandibular joint disorder) 07/23/2017  . Gastroesophageal reflux disease without esophagitis 07/23/2017  . History of herniated intervertebral disc 12/25/2016  . Palpitations 12/25/2016  . Anxiety and depression 12/06/2015  . Chronic bilateral low back pain without sciatica 12/06/2015  . Essential hypertension 07/08/2013     Current Outpatient Medications on File Prior to Visit  Medication Sig Dispense Refill  . acetaminophen (TYLENOL) 500 MG tablet Take 1,000 mg by mouth every 6 (six) hours as needed for mild pain, moderate pain, fever or headache.    . DULoxetine (CYMBALTA) 20 MG capsule Take 2 capsules (40 mg total) by mouth daily. 60 capsule 2  . ibuprofen (ADVIL,MOTRIN) 800 MG tablet Take 800 mg by mouth every 8 (eight) hours as needed for mild pain.    . Menthol-Camphor (ICY HOT ADVANCED RELIEF) 16-11 % CREA Apply 1 application topically daily as needed (pain).    . Multiple Vitamin (MULTIVITAMIN WITH  MINERALS) TABS tablet Take 1 tablet by mouth daily.    Marland Kitchen omeprazole (PRILOSEC) 20 MG capsule Take 1 capsule (20 mg total) by mouth daily. 30 capsule 3   No current facility-administered medications on file prior to visit.     Allergies  Allergen Reactions  . Prednisone     "makes him feel weird"    Social History   Socioeconomic History  . Marital status: Single    Spouse name: Not on file  . Number of children: Not on file  . Years of education: Not on file  . Highest education level: Not on file  Occupational History  . Not on file  Social Needs  . Financial resource strain: Not on file  . Food insecurity:    Worry: Not on file    Inability: Not on file  . Transportation needs:    Medical: Not on file    Non-medical: Not on file  Tobacco Use  . Smoking status: Former Games developer  . Smokeless tobacco: Never Used  Substance and Sexual Activity  . Alcohol use: No  . Drug use: No  . Sexual activity: Not on file  Lifestyle  . Physical activity:    Days per week: Not on file    Minutes per session: Not on file  . Stress: Not on file  Relationships  . Social connections:    Talks on phone: Not on file    Gets together: Not on file    Attends religious service: Not on file  Active member of club or organization: Not on file    Attends meetings of clubs or organizations: Not on file    Relationship status: Not on file  . Intimate partner violence:    Fear of current or ex partner: Not on file    Emotionally abused: Not on file    Physically abused: Not on file    Forced sexual activity: Not on file  Other Topics Concern  . Not on file  Social History Narrative  . Not on file    Family History  Problem Relation Age of Onset  . Diabetes Maternal Grandmother   . Hypertension Maternal Grandmother   . Diabetes Maternal Grandfather   . Diabetes Paternal Grandmother   . Heart disease Paternal Grandmother   . Diabetes Paternal Grandfather     No past surgical  history on file.  ROS: Review of Systems Negative except as stated above  PHYSICAL EXAM: BP (!) 135/91   Pulse 72   Temp 98.5 F (36.9 C) (Oral)   Resp 16   Wt 256 lb 14.4 oz (116.5 kg)   SpO2 96%   BMI 35.83 kg/m   Wt Readings from Last 3 Encounters:  05/12/18 256 lb 14.4 oz (116.5 kg)  01/02/18 254 lb 12.8 oz (115.6 kg)  07/23/17 261 lb 12.8 oz (118.8 kg)  BP 133/90  Physical Exam  General appearance - alert, well appearing, and in no distress Mental status - normal mood, behavior, speech, dress, motor activity, and thought processes Ears - bilateral TM's and external ear canals normal Good movement at the TM J joints bilaterally. Neck - supple, no significant adenopathy Chest - clear to auscultation, no wheezes, rales or rhonchi, symmetric air entry Heart - normal rate, regular rhythm, normal S1, S2, no murmurs, rubs, clicks or gallops Neurological - cranial nerves II through XII intact, motor and sensory grossly normal bilaterally, Romberg sign negative, normal gait and station  CMP Latest Ref Rng & Units 01/18/2017 12/25/2016 10/10/2016  Glucose 65 - 99 mg/dL 496(P) 97 591(M)  BUN 6 - 20 mg/dL 20 14 17   Creatinine 0.61 - 1.24 mg/dL 3.84 6.65 9.93  Sodium 135 - 145 mmol/L 140 139 138  Potassium 3.5 - 5.1 mmol/L 4.0 4.0 3.4(L)  Chloride 101 - 111 mmol/L 105 98 101  CO2 22 - 32 mmol/L 26 27 28   Calcium 8.9 - 10.3 mg/dL 9.8 10.4(H) 8.9  Total Protein 6.5 - 8.1 g/dL 7.6 - -  Total Bilirubin 0.3 - 1.2 mg/dL 0.8 - -  Alkaline Phos 38 - 126 U/L 46 - -  AST 15 - 41 U/L 30 - -  ALT 17 - 63 U/L 45 - -   Lipid Panel  No results found for: CHOL, TRIG, HDL, CHOLHDL, VLDL, LDLCALC, LDLDIRECT  CBC    Component Value Date/Time   WBC 5.6 07/23/2017 1440   WBC 6.3 01/18/2017 0824   RBC 5.24 07/23/2017 1440   RBC 4.90 01/18/2017 0824   HGB 16.7 07/23/2017 1440   HCT 47.6 07/23/2017 1440   PLT 220 07/23/2017 1440   MCV 91 07/23/2017 1440   MCH 31.9 07/23/2017 1440   MCH  32.4 01/18/2017 0824   MCHC 35.1 07/23/2017 1440   MCHC 35.7 01/18/2017 0824   RDW 15.2 07/23/2017 1440   LYMPHSABS 1.8 10/10/2016 2313   MONOABS 0.5 10/10/2016 2313   EOSABS 0.1 10/10/2016 2313   BASOSABS 0.0 10/10/2016 2313    ASSESSMENT AND PLAN: 1. Essential hypertension Not at goal.  Continue chlorthalidone.  Recommend that he restart amlodipine at 5 mg daily.  Continue DASH diet. - chlorthalidone (HYGROTON) 25 MG tablet; Take 1 tablet (25 mg total) by mouth daily.  Dispense: 30 tablet; Refill: 6 - amLODipine (NORVASC) 5 MG tablet; Take 1 tablet (5 mg total) by mouth daily.  Dispense: 30 tablet; Refill: 6  2. Screen for STD (sexually transmitted disease) - HIV Antibody (routine testing w rflx) - Urine cytology ancillary only  3. Benign paroxysmal positional vertigo, unspecified laterality Discussed diagnosis with patient.  Encouraged him to apply for the orange card/cone discount.  Once approved we can refer him for vestibular training.  4. TMJ syndrome Discussed diagnosis.  Printed information given  5. Anxiety and depression Patient requested refill on hydroxyzine. - hydrOXYzine (ATARAX/VISTARIL) 25 MG tablet; Take 1 tablet (25 mg total) by mouth 2 (two) times daily as needed for anxiety.  Dispense: 30 tablet; Refill: 1    Patient was given the opportunity to ask questions.  Patient verbalized understanding of the plan and was able to repeat key elements of the plan.   Orders Placed This Encounter  Procedures  . HIV Antibody (routine testing w rflx)     Requested Prescriptions   Signed Prescriptions Disp Refills  . hydrOXYzine (ATARAX/VISTARIL) 25 MG tablet 30 tablet 1    Sig: Take 1 tablet (25 mg total) by mouth 2 (two) times daily as needed for anxiety.  . chlorthalidone (HYGROTON) 25 MG tablet 30 tablet 6    Sig: Take 1 tablet (25 mg total) by mouth daily.  Marland Kitchen amLODipine (NORVASC) 5 MG tablet 30 tablet 6    Sig: Take 1 tablet (5 mg total) by mouth daily.     No follow-ups on file.  Jonah Blue, MD, FACP

## 2018-05-13 ENCOUNTER — Telehealth: Payer: Self-pay | Admitting: Internal Medicine

## 2018-05-13 ENCOUNTER — Telehealth: Payer: Self-pay

## 2018-05-13 LAB — URINE CYTOLOGY ANCILLARY ONLY
Chlamydia: NEGATIVE
NEISSERIA GONORRHEA: NEGATIVE
TRICH (WINDOWPATH): NEGATIVE

## 2018-05-13 LAB — HIV ANTIBODY (ROUTINE TESTING W REFLEX): HIV Screen 4th Generation wRfx: NONREACTIVE

## 2018-05-13 NOTE — Telephone Encounter (Signed)
Pt returned call and is aware of lab results just waiting for urine results

## 2018-05-13 NOTE — Telephone Encounter (Signed)
Contacted pt to go over lab results pt didn't answer lvm asking pt to give me a call at his earliest convenience  

## 2018-05-13 NOTE — Telephone Encounter (Signed)
Pt returning call for results please follow up

## 2018-05-15 ENCOUNTER — Telehealth: Payer: Self-pay

## 2018-05-15 NOTE — Telephone Encounter (Signed)
Contacted pt to go over urine results pt is aware and doesn't have any questions or concerns  

## 2018-06-30 ENCOUNTER — Ambulatory Visit: Payer: Self-pay | Attending: Internal Medicine | Admitting: Internal Medicine

## 2018-06-30 ENCOUNTER — Other Ambulatory Visit: Payer: Self-pay

## 2018-06-30 DIAGNOSIS — H8110 Benign paroxysmal vertigo, unspecified ear: Secondary | ICD-10-CM

## 2018-06-30 DIAGNOSIS — I10 Essential (primary) hypertension: Secondary | ICD-10-CM

## 2018-06-30 MED ORDER — BLOOD PRESSURE MONITOR DEVI: 0 refills | Status: DC

## 2018-06-30 NOTE — Progress Notes (Signed)
Virtual Visit via Telephone Note Due to current restrictions/limitations of in-office visits due to the COVID-19 pandemic, this scheduled clinical appointment was converted to a telehealth visit  I connected with Walter Howell on 06/30/18 at 4:21 p.m EDT by telephone from my office and verified that I am speaking with the correct person using two identifiers.  Pt is at home.  Only pt and myself participated in this encounter   I discussed the limitations, risks, security and privacy concerns of performing an evaluation and management service by telephone and the availability of in person appointments. I also discussed with the patient that there may be a patient responsible charge related to this service. The patient expressed understanding and agreed to proceed.   History of Present Illness: Patient with history of HTN,anx/dep,chronic lower back pain.  Last seen 04/2018.   HTN:  On last visit pt told to restart Norvasc 5 mg.  Reports compliance with Chlorthalidone and Norvasc.  No device to check blood pressure.  He limits salt in the foods.  No chest pains or shortness of breath.  Still gets intermittent vertigo.  He now has insurance with [employer]. Observations/Objective: No direct observation done.  Assessment and Plan: 1. Essential hypertension Now that he has insurance, he is agreeable for prescription to be sent for him to get a blood pressure monitoring device.  We went over goal for blood pressure being 130/80 or lower.  Continue to limit salt in the foods. - Blood Pressure Monitor DEVI; Use as directed to check home blood pressure 2-3 times a week  Dispense: 1 Device; Refill: 0  2. Benign paroxysmal positional vertigo, unspecified laterality Now that he has insurance we can refer him for vestibular rehab - PT vestibular rehab; Future   Follow Up Instructions: Follow-up in 4 months   I discussed the assessment and treatment plan with the patient. The  patient was provided an opportunity to ask questions and all were answered. The patient agreed with the plan and demonstrated an understanding of the instructions.   The patient was advised to call back or seek an in-person evaluation if the symptoms worsen or if the condition fails to improve as anticipated.  I provided 7 minutes of non-face-to-face time during this encounter.   Jonah Blue, MD

## 2018-09-09 ENCOUNTER — Telehealth: Payer: Self-pay | Admitting: Internal Medicine

## 2018-09-09 MED FILL — hydrOXYzine HCL 25 MG TABS: 25 | 15 days supply | Qty: 30 | Fill #0

## 2018-09-09 MED FILL — CHLORTHALIDONE 25 MG TAB: 25 | 30 days supply | Qty: 30 | Fill #3

## 2018-09-09 NOTE — Telephone Encounter (Signed)
Pt called states he has been exposed to COVID-19. He is requesting a test. Please return call [phone].

## 2018-09-09 NOTE — Telephone Encounter (Signed)
Patient should contact the health dept or report to the community testing. You may relay to patients without forwarding an encounter

## 2018-09-10 ENCOUNTER — Other Ambulatory Visit: Payer: Self-pay | Admitting: Internal Medicine

## 2018-09-10 NOTE — Telephone Encounter (Signed)
Left message on voicemail for patient to return call,  Please inform that the testing sites are open and no appointment is needed at [facility], [facility], or [facility].

## 2018-09-11 NOTE — Telephone Encounter (Signed)
Patients call returned.  Patient identified by name and date of birth.  Patient informed of open COVID-19 testing sites.  Patient acknowledged understanding of advice.

## 2018-09-13 LAB — NOVEL CORONAVIRUS, NAA: SARS-CoV-2, NAA: NOT DETECTED

## 2018-09-17 ENCOUNTER — Telehealth: Payer: Self-pay | Admitting: Internal Medicine

## 2018-09-17 NOTE — Telephone Encounter (Signed)
Sent in error to clinical pool. Routed correctly to [place] [place]

## 2018-09-17 NOTE — Telephone Encounter (Signed)
Pt called request COVID results. Advised cma will call with results once received.  [phone] conf pt phone as correct.

## 2018-09-18 NOTE — Telephone Encounter (Signed)
Contacted pt and informed him of results  Pt is needing a letter to return to work

## 2018-09-18 NOTE — Telephone Encounter (Signed)
His Covid-19 test was negative.  Thanks, Freeman Caldron, PA-C

## 2018-09-18 NOTE — Telephone Encounter (Signed)
Will route to [facility] for results

## 2018-09-18 NOTE — Telephone Encounter (Signed)
Pt would like to know covid test results, he states he's out of work due to this and would like to be informed asap..please follow up

## 2018-09-18 NOTE — Telephone Encounter (Signed)
Wrote pt a return to work note and placed up front for pickup

## 2018-10-16 ENCOUNTER — Encounter (HOSPITAL_COMMUNITY): Payer: Self-pay | Admitting: Emergency Medicine

## 2018-10-16 ENCOUNTER — Ambulatory Visit (HOSPITAL_COMMUNITY)
Admission: EM | Admit: 2018-10-16 | Discharge: 2018-10-16 | Disposition: A | Discharge status: Home / Self Care | Payer: No Typology Code available for payment source | Attending: Urgent Care | Admitting: Urgent Care

## 2018-10-16 ENCOUNTER — Other Ambulatory Visit: Payer: Self-pay

## 2018-10-16 DIAGNOSIS — I10 Essential (primary) hypertension: Secondary | ICD-10-CM | POA: Insufficient documentation

## 2018-10-16 DIAGNOSIS — Z888 Allergy status to other drugs, medicaments and biological substances status: Secondary | ICD-10-CM | POA: Insufficient documentation

## 2018-10-16 DIAGNOSIS — K219 Gastro-esophageal reflux disease without esophagitis: Secondary | ICD-10-CM | POA: Diagnosis not present

## 2018-10-16 DIAGNOSIS — Z79899 Other long term (current) drug therapy: Secondary | ICD-10-CM | POA: Insufficient documentation

## 2018-10-16 DIAGNOSIS — J45909 Unspecified asthma, uncomplicated: Secondary | ICD-10-CM | POA: Insufficient documentation

## 2018-10-16 DIAGNOSIS — R630 Anorexia: Secondary | ICD-10-CM

## 2018-10-16 DIAGNOSIS — B349 Viral infection, unspecified: Secondary | ICD-10-CM | POA: Diagnosis present

## 2018-10-16 DIAGNOSIS — R110 Nausea: Secondary | ICD-10-CM | POA: Diagnosis not present

## 2018-10-16 DIAGNOSIS — Z20828 Contact with and (suspected) exposure to other viral communicable diseases: Secondary | ICD-10-CM | POA: Insufficient documentation

## 2018-10-16 DIAGNOSIS — G44209 Tension-type headache, unspecified, not intractable: Secondary | ICD-10-CM

## 2018-10-16 MED ORDER — ONDANSETRON 8 MG PO TBDP: 8.0000 mg | ORAL_TABLET | Freq: Three times a day (TID) | ORAL | 0 refills | Status: DC | PRN

## 2018-10-16 MED ORDER — NAPROXEN 500 MG PO TABS: 500.0000 mg | ORAL_TABLET | Freq: Two times a day (BID) | ORAL | 0 refills | Status: DC

## 2018-10-16 NOTE — ED Triage Notes (Signed)
PT reports nausea, cold chills, headache, and diarrhea for 2-3 days. PT was tested for covid a month ago without symptoms and that result was negative. Would like to re-test.

## 2018-10-16 NOTE — ED Provider Notes (Signed)
MRN: [id number] DOB: [birth year]  Subjective:   Walter Howell is a 33 y.o. male presenting for 2 to 3-day history of mild to moderate malaise.  Patient has tried salt water gargle without any change in his symptoms.  He would like to be tested for COVID-19 given his exposure as he works in [employer].  Patient has a history of hypertension, forgot to take his blood pressure medications this morning.  Patient does not smoke cigarettes or drink alcohol.  He is a former smoker.  No current facility-administered medications for this encounter.   Current Outpatient Medications:  .  amLODipine (NORVASC) 5 MG tablet, Take 1 tablet (5 mg total) by mouth daily., Disp: 30 tablet, Rfl: 6 .  chlorthalidone (HYGROTON) 25 MG tablet, Take 1 tablet (25 mg total) by mouth daily., Disp: 30 tablet, Rfl: 6 .  Multiple Vitamin (MULTIVITAMIN WITH MINERALS) TABS tablet, Take 1 tablet by mouth daily., Disp: , Rfl:  .  omeprazole (PRILOSEC) 20 MG capsule, Take 1 capsule (20 mg total) by mouth daily., Disp: 30 capsule, Rfl: 3 .  acetaminophen (TYLENOL) 500 MG tablet, Take 1,000 mg by mouth every 6 (six) hours as needed for mild pain, moderate pain, fever or headache., Disp: , Rfl:  .  Blood Pressure Monitor DEVI, Use as directed to check home blood pressure 2-3 times a week, Disp: 1 Device, Rfl: 0 .  DULoxetine (CYMBALTA) 20 MG capsule, Take 2 capsules (40 mg total) by mouth daily., Disp: 60 capsule, Rfl: 2 .  hydrOXYzine (ATARAX/VISTARIL) 25 MG tablet, Take 1 tablet (25 mg total) by mouth 2 (two) times daily as needed for anxiety., Disp: 30 tablet, Rfl: 1 .  ibuprofen (ADVIL,MOTRIN) 800 MG tablet, Take 800 mg by mouth every 8 (eight) hours as needed for mild pain., Disp: , Rfl:  .  Menthol-Camphor (ICY HOT ADVANCED RELIEF) 16-11 % CREA, Apply 1 application topically daily as needed (pain)., Disp: , Rfl:    Allergies  Allergen Reactions  . Prednisone     "makes him feel weird"    Past Medical History:   Diagnosis Date  . Anxiety   . Asthma   . GERD (gastroesophageal reflux disease)   . Hypertension      History reviewed. No pertinent surgical history.  Review of Systems  Constitutional: Positive for chills and malaise/fatigue. Negative for fever.  HENT: Positive for sore throat (left sided neck, no difficulty swallowing). Negative for congestion, ear pain and sinus pain.   Eyes: Negative for blurred vision, double vision, discharge and redness.  Respiratory: Negative for cough, hemoptysis, shortness of breath and wheezing.   Cardiovascular: Negative for chest pain.  Gastrointestinal: Positive for nausea. Negative for abdominal pain, blood in stool, constipation, diarrhea and vomiting.  Genitourinary: Negative for dysuria, flank pain and hematuria.  Musculoskeletal: Positive for myalgias.  Skin: Negative for rash.  Neurological: Positive for headaches. Negative for dizziness and weakness.  Psychiatric/Behavioral: Negative for depression and substance abuse.    Objective:   Vitals: BP (!) 130/95   Pulse 61   Temp 98.5 F (36.9 C) (Oral)   Resp 16   SpO2 96%   Physical Exam Constitutional:      General: He is not in acute distress.    Appearance: Normal appearance. He is well-developed and normal weight. He is not ill-appearing, toxic-appearing or diaphoretic.  HENT:     Head: Normocephalic and atraumatic.     Right Ear: Tympanic membrane, ear canal and external ear normal. There is no  impacted cerumen.     Left Ear: Tympanic membrane, ear canal and external ear normal. There is no impacted cerumen.     Nose: Nose normal. No congestion or rhinorrhea.     Mouth/Throat:     Mouth: Mucous membranes are moist.     Pharynx: No oropharyngeal exudate or posterior oropharyngeal erythema.     Comments: Significant postnasal drainage and cobblestone pattern. Eyes:     General: No scleral icterus.       Right eye: No discharge.        Left eye: No discharge.     Extraocular  Movements: Extraocular movements intact.     Conjunctiva/sclera: Conjunctivae normal.     Pupils: Pupils are equal, round, and reactive to light.  Neck:     Musculoskeletal: Normal range of motion and neck supple. No neck rigidity or muscular tenderness.  Cardiovascular:     Rate and Rhythm: Normal rate and regular rhythm.     Heart sounds: Normal heart sounds. No murmur. No friction rub. No gallop.   Pulmonary:     Effort: Pulmonary effort is normal. No respiratory distress.     Breath sounds: Normal breath sounds. No stridor. No wheezing, rhonchi or rales.  Abdominal:     General: Bowel sounds are normal. There is no distension.     Palpations: Abdomen is soft. There is no mass.     Tenderness: There is no abdominal tenderness. There is no guarding or rebound.  Neurological:     General: No focal deficit present.     Mental Status: He is alert and oriented to person, place, and time.     Cranial Nerves: No cranial nerve deficit.     Coordination: Coordination normal.  Psychiatric:        Mood and Affect: Mood normal.        Behavior: Behavior normal.        Thought Content: Thought content normal.        Judgment: Judgment normal.     Assessment and Plan :   1. Viral syndrome   2. Decreased appetite   3. Acute non intractable tension-type headache   4. Nausea   5. Essential hypertension   6. Elevated blood pressure reading with diagnosis of hypertension     Likely viral in etiology. Counseled patient on nature of COVID-19 including modes of transmission, diagnostic testing, management and supportive care.  Offered symptomatic relief. COVID 19 testing is pending. Counseled patient on potential for adverse effects with medications prescribed/recommended today, ER and return-to-clinic precautions discussed, patient verbalized understanding.  Encourage patient to follow-up with PCP regarding concerns for gallbladder disease.  Also recommended patient maintain his blood pressure  medications and follow-up with his PCP if his BP remains elevated.    Jaynee Eagles, PA-C 10/16/18 1055

## 2018-10-16 NOTE — Discharge Instructions (Addendum)
We will manage this as a viral syndrome. For sore throat or cough try using a honey-based tea. Use 3 teaspoons of honey with juice squeezed from half lemon. Place shaved pieces of ginger into 1/2-1 cup of water and warm over stove top. Then mix the ingredients and repeat every 4 hours as needed. Please take Tylenol 500mg  every 6 hours. Hydrate very well with at least 2 liters of water. Eat light meals such as soups to replenish electrolytes and soft fruits, veggies. Start an antihistamine like Zyrtec, Allegra or Claritin for postnasal drainage, sinus congestion.  You can take this together with pseudoephedrine (Sudafed) at a dose of 60 mg 2-3 times a day as needed for the same kind of congestion.  However, do not take Sudafed at all or at full doses if you have high blood pressure or are prone to palpitations, have abnormal heart rhythms.

## 2018-10-18 LAB — NOVEL CORONAVIRUS, NAA (HOSP ORDER, SEND-OUT TO REF LAB; TAT 18-24 HRS): SARS-CoV-2, NAA: NOT DETECTED

## 2018-10-22 ENCOUNTER — Other Ambulatory Visit: Payer: Self-pay | Admitting: Internal Medicine

## 2018-10-22 DIAGNOSIS — F32A Depression, unspecified: Secondary | ICD-10-CM

## 2018-10-22 DIAGNOSIS — F329 Major depressive disorder, single episode, unspecified: Secondary | ICD-10-CM

## 2018-11-04 ENCOUNTER — Ambulatory Visit: Payer: Self-pay | Admitting: Internal Medicine

## 2018-11-06 ENCOUNTER — Ambulatory Visit: Payer: Self-pay | Admitting: Internal Medicine

## 2018-11-20 ENCOUNTER — Other Ambulatory Visit: Payer: Self-pay

## 2018-11-20 ENCOUNTER — Encounter (HOSPITAL_COMMUNITY): Payer: Self-pay | Admitting: Emergency Medicine

## 2018-11-20 ENCOUNTER — Ambulatory Visit (HOSPITAL_COMMUNITY)
Admission: EM | Admit: 2018-11-20 | Discharge: 2018-11-20 | Disposition: A | Discharge status: Home / Self Care | Payer: No Typology Code available for payment source | Attending: Emergency Medicine | Admitting: Emergency Medicine

## 2018-11-20 ENCOUNTER — Other Ambulatory Visit: Payer: Self-pay | Admitting: Emergency Medicine

## 2018-11-20 DIAGNOSIS — M7522 Bicipital tendinitis, left shoulder: Secondary | ICD-10-CM | POA: Diagnosis present

## 2018-11-20 DIAGNOSIS — Z113 Encounter for screening for infections with a predominantly sexual mode of transmission: Secondary | ICD-10-CM | POA: Diagnosis not present

## 2018-11-20 DIAGNOSIS — M7521 Bicipital tendinitis, right shoulder: Secondary | ICD-10-CM | POA: Diagnosis present

## 2018-11-20 DIAGNOSIS — S46911A Strain of unspecified muscle, fascia and tendon at shoulder and upper arm level, right arm, initial encounter: Secondary | ICD-10-CM | POA: Diagnosis not present

## 2018-11-20 DIAGNOSIS — R300 Dysuria: Secondary | ICD-10-CM

## 2018-11-20 DIAGNOSIS — S46919A Strain of unspecified muscle, fascia and tendon at shoulder and upper arm level, unspecified arm, initial encounter: Secondary | ICD-10-CM | POA: Insufficient documentation

## 2018-11-20 DIAGNOSIS — X503XXA Overexertion from repetitive movements, initial encounter: Secondary | ICD-10-CM

## 2018-11-20 DIAGNOSIS — S46912A Strain of unspecified muscle, fascia and tendon at shoulder and upper arm level, left arm, initial encounter: Secondary | ICD-10-CM

## 2018-11-20 LAB — POCT URINALYSIS DIP (DEVICE)
Bilirubin Urine: NEGATIVE
Glucose, UA: NEGATIVE mg/dL
Hgb urine dipstick: NEGATIVE
Ketones, ur: NEGATIVE mg/dL
Leukocytes,Ua: NEGATIVE
Nitrite: NEGATIVE
Protein, ur: NEGATIVE mg/dL
Specific Gravity, Urine: 1.025 (ref 1.005–1.030)
Urobilinogen, UA: 0.2 mg/dL (ref 0.0–1.0)
pH: 5.5 (ref 5.0–8.0)

## 2018-11-20 MED ORDER — IBUPROFEN 600 MG PO TABS: 600.0000 mg | ORAL_TABLET | Freq: Four times a day (QID) | ORAL | 0 refills | Status: DC | PRN

## 2018-11-20 MED ORDER — TIZANIDINE HCL 4 MG PO TABS: 4.0000 mg | ORAL_TABLET | Freq: Three times a day (TID) | ORAL | 0 refills | Status: DC | PRN

## 2018-11-20 NOTE — ED Triage Notes (Signed)
Pt here for upper back pain into neck

## 2018-11-20 NOTE — ED Provider Notes (Signed)
HPI  SUBJECTIVE:  Walter Howell is a 33 y.o. male who presents with 2 weeks of bilateral upper chest/shoulder/back pain for the past several weeks.  He describes as achy, intermittent, lasting minutes.  States is located primarily in his shoulders.  He does a lot of repetitive upper body activity at work and states that he has been working more recently.  He reports occasional numbness in his hands at night after he has been sleeping on his shoulder.  He denies grip weakness.  No fevers, cough, wheezing, shortness of breath.  No other chest pain.  there is no exertional component to his symptoms.  He has tried [facility] powders and Tylenol with improvement in his symptoms.  Symptoms are worse with lifting things, lying down.    He also requests to be checked for STDs.  He is in a relatively new monogamous sexual relationship with a male partner who is asymptomatic.  Denies having receptive or penetrative anal sex.  States that he has oral sex only.  No penile rash, discharge.  He does report dysuria for the past week.  No urgency, frequency, cloudy or odorous urine, hematuria.  No abdominal, pelvic, perineal pain.  No testicular scrotal pain or swelling.  No rash.  No sore throat.  Past medical history of hypertension, chronic low back pain with sciatica.  No history of gonorrhea, chlamydia, HIV, gonorrhea, HSV, syphilis, trichomonas.  No history of UTI, pyelonephritis, nephrolithiasis.  No history of epididymitis, orchitis, prostatitis, diabetes.  [facility].    Past Medical History:  Diagnosis Date  . Anxiety   . Asthma   . GERD (gastroesophageal reflux disease)   . Hypertension     History reviewed. No pertinent surgical history.  Family History  Problem Relation Age of Onset  . Diabetes Maternal Grandmother   . Hypertension Maternal Grandmother   . Diabetes Maternal Grandfather   . Diabetes Paternal Grandmother   . Heart disease Paternal Grandmother   .  Diabetes Paternal Grandfather     Social History   Tobacco Use  . Smoking status: Former Games developer  . Smokeless tobacco: Never Used  Substance Use Topics  . Alcohol use: No  . Drug use: No    No current facility-administered medications for this encounter.   Current Outpatient Medications:  .  acetaminophen (TYLENOL) 500 MG tablet, Take 1,000 mg by mouth every 6 (six) hours as needed for mild pain, moderate pain, fever or headache., Disp: , Rfl:  .  amLODipine (NORVASC) 5 MG tablet, Take 1 tablet (5 mg total) by mouth daily., Disp: 30 tablet, Rfl: 6 .  Blood Pressure Monitor DEVI, Use as directed to check home blood pressure 2-3 times a week, Disp: 1 Device, Rfl: 0 .  chlorthalidone (HYGROTON) 25 MG tablet, Take 1 tablet (25 mg total) by mouth daily., Disp: 30 tablet, Rfl: 6 .  DULoxetine (CYMBALTA) 20 MG capsule, Take 2 capsules (40 mg total) by mouth daily., Disp: 60 capsule, Rfl: 2 .  hydrOXYzine (ATARAX/VISTARIL) 25 MG tablet, Take 1 tablet (25 mg total) by mouth 2 (two) times daily. Must keep upcoming office visit for refills, Disp: 30 tablet, Rfl: 0 .  ibuprofen (ADVIL) 600 MG tablet, Take 1 tablet (600 mg total) by mouth every 6 (six) hours as needed., Disp: 30 tablet, Rfl: 0 .  Menthol-Camphor (ICY HOT ADVANCED RELIEF) 16-11 % CREA, Apply 1 application topically daily as needed (pain)., Disp: , Rfl:  .  Multiple Vitamin (MULTIVITAMIN WITH MINERALS)  TABS tablet, Take 1 tablet by mouth daily., Disp: , Rfl:  .  naproxen (NAPROSYN) 500 MG tablet, Take 1 tablet (500 mg total) by mouth 2 (two) times daily., Disp: 30 tablet, Rfl: 0 .  omeprazole (PRILOSEC) 20 MG capsule, Take 1 capsule (20 mg total) by mouth daily., Disp: 30 capsule, Rfl: 3 .  ondansetron (ZOFRAN-ODT) 8 MG disintegrating tablet, Take 1 tablet (8 mg total) by mouth every 8 (eight) hours as needed for nausea., Disp: 20 tablet, Rfl: 0 .  tiZANidine (ZANAFLEX) 4 MG tablet, Take 1 tablet (4 mg total) by mouth every 8 (eight)  hours as needed for muscle spasms., Disp: 30 tablet, Rfl: 0  Allergies  Allergen Reactions  . Prednisone     "makes him feel weird"     ROS  As noted in HPI.   Physical Exam  BP (!) 152/98 (BP Location: Right Arm)   Pulse 60   Temp 97.6 F (36.4 C) (Tympanic)   Resp 17   SpO2 96%   Constitutional: Well developed, well nourished, no acute distress Eyes:  EOMI, conjunctiva normal bilaterally HENT: Normocephalic, atraumatic,mucus membranes moist Respiratory: Normal inspiratory effort Cardiovascular: Normal rate GI: nondistended, soft.  Suprapubic, flank tenderness.   GU: Normal uncircumcised male no penile rash, discharge.  No testicular swelling, tenderness.  No epididymal tenderness.  No scrotal erythema, edema, tenderness.  Patient declined chaperone. Lymph: No inguinal lymphadenopathy Back: No CVAT skin: No rash, skin intact Musculoskeletal: No C-spine tenderness.  Bilateral shoulders: Bilateral trapezial tenderness.  Positive tenderness along the biceps tendon bilaterally. bilateral shoulder with ROM normal, Drop test normal, clavicle NT, A/C joint NT , scapula NT, proximal humerus NT, shoulder joint NT, Motor strength normal, Sensation intact LT over deltoid region, distal NVI with hands having grossly intact sensation and strength in the distribution of the median, radial, and ulnar nerve.  Hands pink, warm.  Grip strength 5/5 and equal bilaterally.  Patient has 5/5 strength of the elbows and shoulders bilaterally.  Pain with internal rotation, pain with external rotation.  Neurologic: Alert & oriented x 3, no focal neuro deficits Psychiatric: Speech and behavior appropriate   ED Course   Medications - No data to display  Orders Placed This Encounter  Procedures  . Urine culture    Standing Status:   Standing    Number of Occurrences:   1  . RPR    Standing Status:   Standing    Number of Occurrences:   1  . HIV antibody    Standing Status:   Standing    Number  of Occurrences:   1  . POCT urinalysis dip (device)    Standing Status:   Standing    Number of Occurrences:   1    Results for orders placed or performed during the hospital encounter of 11/20/18 (from the past 24 hour(s))  POCT urinalysis dip (device)     Status: None   Collection Time: 11/20/18  5:52 PM  Result Value Ref Range   Glucose, UA NEGATIVE NEGATIVE mg/dL   Bilirubin Urine NEGATIVE NEGATIVE   Ketones, ur NEGATIVE NEGATIVE mg/dL   Specific Gravity, Urine 1.025 1.005 - 1.030   Hgb urine dipstick NEGATIVE NEGATIVE   pH 5.5 5.0 - 8.0   Protein, ur NEGATIVE NEGATIVE mg/dL   Urobilinogen, UA 0.2 0.0 - 1.0 mg/dL   Nitrite NEGATIVE NEGATIVE   Leukocytes,Ua NEGATIVE NEGATIVE   No results found.  ED Clinical Impression  1. Biceps tendonitis of both shoulders  2. Repetitive strain injury of shoulder, unspecified laterality, initial encounter   3. Screening for STD (sexually transmitted disease)   4. Dysuria      ED Assessment/Plan  1.  Musculoskeletal shoulder pain.  Patient does a lot of lifting at work, and has been working more recently.  Doubt ACS, dissection.  He has reproducible tenderness over bilateral trapezius and shoulders.  Suspect that he also has a biceps tendinitis.  Will send home with Tylenol ibuprofen 3-4 times a day, Zanaflex.  2.  STD check.  He has some dysuria, so we will check a UA.  Sending off gonorrhea, chlamydia, trichomonas, HIV, RPR.  He wishes to wait for labs to be treated.    Urine negative for UTI.  Sending for culture to confirm absence of UTI.  Discussed labs, MDM, treatment plan, and plan for follow-up with patient.  patient agrees with plan.   Meds ordered this encounter  Medications  . ibuprofen (ADVIL) 600 MG tablet    Sig: Take 1 tablet (600 mg total) by mouth every 6 (six) hours as needed.    Dispense:  30 tablet    Refill:  0  . tiZANidine (ZANAFLEX) 4 MG tablet    Sig: Take 1 tablet (4 mg total) by mouth every 8 (eight)  hours as needed for muscle spasms.    Dispense:  30 tablet    Refill:  0    *This clinic note was created using [employer]. Therefore, there may be occasional mistakes despite careful proofreading.   ?    Melynda Ripple, MD 11/20/18 1950

## 2018-11-20 NOTE — Discharge Instructions (Addendum)
Take the medication as written. Give [place] a working phone number so that we can contact you if needed. Refrain from sexual contact until you know your results and your partner(s) are treated if necessary. The most efficient way to get your lab results through my chart.  Make sure you sign up for this before you leave.  Return to the ER if you get worse, have a fever >100.4, or for any concerns.   600 mg of ibuprofen combined with 1 g of Tylenol 3-4 times a day.  Stop BC powders.  Deep tissue massage may be helpful.  Zanaflex will help with any muscle spasms.  Follow-up with your doctor at the [facility] if you are not improving in several weeks.  Go to www.goodrx.com to look up your medications. This will give you a list of where you can find your prescriptions at the most affordable prices. Or ask the pharmacist what the cash price is, or if they have any other discount programs available to help make your medication more affordable. This can be less expensive than what you would pay with insurance.

## 2018-11-21 LAB — URINE CULTURE: Culture: NO GROWTH

## 2018-11-21 LAB — CYTOLOGY, (ORAL, ANAL, URETHRAL) ANCILLARY ONLY
Chlamydia: NEGATIVE
Neisseria Gonorrhea: NEGATIVE
Trichomonas: NEGATIVE

## 2018-11-21 LAB — HIV ANTIBODY (ROUTINE TESTING W REFLEX): HIV Screen 4th Generation wRfx: NONREACTIVE

## 2018-11-21 LAB — RPR: RPR Ser Ql: NONREACTIVE

## 2018-11-21 MED FILL — IBUPROFEN 600 MG TABLET: 600 | 7 days supply | Qty: 30 | Fill #0

## 2018-11-21 MED FILL — tiZANidine HCL 4 MG TABS: 4 | 10 days supply | Qty: 30 | Fill #0

## 2018-12-01 ENCOUNTER — Other Ambulatory Visit: Payer: Self-pay

## 2018-12-01 ENCOUNTER — Encounter (HOSPITAL_COMMUNITY): Payer: Self-pay

## 2018-12-01 ENCOUNTER — Ambulatory Visit (HOSPITAL_COMMUNITY)
Admission: EM | Admit: 2018-12-01 | Discharge: 2018-12-01 | Disposition: A | Discharge status: Home / Self Care | Payer: No Typology Code available for payment source | Attending: Family Medicine | Admitting: Family Medicine

## 2018-12-01 DIAGNOSIS — M62830 Muscle spasm of back: Secondary | ICD-10-CM | POA: Diagnosis not present

## 2018-12-01 DIAGNOSIS — M546 Pain in thoracic spine: Secondary | ICD-10-CM | POA: Diagnosis not present

## 2018-12-01 MED ORDER — HYDROCODONE-ACETAMINOPHEN 7.5-325 MG PO TABS: 1.0000 | ORAL_TABLET | Freq: Four times a day (QID) | ORAL | 0 refills | Status: DC | PRN

## 2018-12-01 MED ORDER — CYCLOBENZAPRINE HCL 10 MG PO TABS: 10.0000 mg | ORAL_TABLET | Freq: Two times a day (BID) | ORAL | 0 refills | Status: DC | PRN

## 2018-12-01 NOTE — Discharge Instructions (Signed)
Ice or heat to area Take ibuprofen for moderate pain Take flexeril as needed, especially at night Take the hydrocodone as needed for severe pain.  Do not drive on the hydrocodone Call your PCP if not improving in a couple of days

## 2018-12-01 NOTE — ED Triage Notes (Signed)
Pt states she having pain in his upper back x 1 week.

## 2018-12-01 NOTE — ED Provider Notes (Signed)
[facility]    CSN: [id number] Arrival date & time: 12/01/18  8527      History   Chief Complaint Chief Complaint  Patient presents with  . Back Pain    HPI Walter Howell is a 33 y.o. male.   HPI  Patient states that he has pain in his upper back.  Is been going on for about a week.  Is getting worse.  Today he felt unable to work.  He did not have any accident or injury, no fall.  He is very physically active on his job and does a lot of lifting pushing and pulling.  The pain is keeping him awake at night.  No neck pain or headache.  No pain with deep breath.  No numbness or weakness into the arms.  He tried the medicine that was given to him for his last musculoskeletal injury, and does not feel like it was helpful.  He states he was given Flexeril in the past and this worked better for him.  He states he has been taking ibuprofen.  He still has "severe" pain.  Past Medical History:  Diagnosis Date  . Anxiety   . Asthma   . GERD (gastroesophageal reflux disease)   . Hypertension     Patient Active Problem List   Diagnosis Date Noted  . Benign paroxysmal positional vertigo 07/23/2017  . TMJ (temporomandibular joint disorder) 07/23/2017  . Gastroesophageal reflux disease without esophagitis 07/23/2017  . History of herniated intervertebral disc 12/25/2016  . Palpitations 12/25/2016  . Anxiety and depression 12/06/2015  . Chronic bilateral low back pain without sciatica 12/06/2015  . Essential hypertension 07/08/2013    History reviewed. No pertinent surgical history.     Home Medications    Prior to Admission medications   Medication Sig Start Date End Date Taking? Authorizing Provider  acetaminophen (TYLENOL) 500 MG tablet Take 1,000 mg by mouth every 6 (six) hours as needed for mild pain, moderate pain, fever or headache.    [provider]  amLODipine (NORVASC) 5 MG tablet Take 1 tablet (5 mg total) by mouth daily. 05/12/18   Ladell Pier, MD  Blood Pressure Monitor DEVI Use as directed to check home blood pressure 2-3 times a week 06/30/18   Ladell Pier, MD  chlorthalidone (HYGROTON) 25 MG tablet Take 1 tablet (25 mg total) by mouth daily. 05/12/18   Ladell Pier, MD  cyclobenzaprine (FLEXERIL) 10 MG tablet Take 1 tablet (10 mg total) by mouth 2 (two) times daily as needed for muscle spasms. 12/01/18   Raylene Everts, MD  DULoxetine (CYMBALTA) 20 MG capsule Take 2 capsules (40 mg total) by mouth daily. 03/28/17   Alfonse Spruce, FNP  HYDROcodone-acetaminophen (NORCO) 7.5-325 MG tablet Take 1 tablet by mouth every 6 (six) hours as needed for moderate pain. 12/01/18   Raylene Everts, MD  hydrOXYzine (ATARAX/VISTARIL) 25 MG tablet Take 1 tablet (25 mg total) by mouth 2 (two) times daily. Must keep upcoming office visit for refills 10/22/18   Ladell Pier, MD  ibuprofen (ADVIL) 600 MG tablet Take 1 tablet (600 mg total) by mouth every 6 (six) hours as needed. 11/20/18   Melynda Ripple, MD  Menthol-Camphor (ICY HOT ADVANCED RELIEF) 16-11 % CREA Apply 1 application topically daily as needed (pain).    [provider]  Multiple Vitamin (MULTIVITAMIN WITH MINERALS) TABS tablet Take 1 tablet by mouth daily.    [provider]  omeprazole (  PRILOSEC) 20 MG capsule Take 1 capsule (20 mg total) by mouth daily. 07/23/17   Marcine MatarJohnson, Deborah B, MD  tiZANidine (ZANAFLEX) 4 MG tablet Take 1 tablet (4 mg total) by mouth every 8 (eight) hours as needed for muscle spasms. 11/20/18   Domenick GongMortenson, Ashley, MD    Family History Family History  Problem Relation Age of Onset  . Diabetes Maternal Grandmother   . Hypertension Maternal Grandmother   . Diabetes Maternal Grandfather   . Diabetes Paternal Grandmother   . Heart disease Paternal Grandmother   . Diabetes Paternal Grandfather     Social History Social History   Tobacco Use  . Smoking status: Former Games developermoker  . Smokeless tobacco: Never Used   Substance Use Topics  . Alcohol use: No  . Drug use: No     Allergies   Prednisone   Review of Systems Review of Systems  Constitutional: Negative for chills and fever.  HENT: Negative for ear pain and sore throat.   Eyes: Negative for pain and visual disturbance.  Respiratory: Negative for cough and shortness of breath.   Cardiovascular: Negative for chest pain and palpitations.  Gastrointestinal: Negative for abdominal pain and vomiting.  Genitourinary: Negative for dysuria and hematuria.  Musculoskeletal: Positive for back pain. Negative for arthralgias.  Skin: Negative for color change and rash.  Neurological: Negative for seizures and syncope.  All other systems reviewed and are negative.    Physical Exam Triage Vital Signs ED Triage Vitals  Enc Vitals Group     BP 12/01/18 1039 (!) 146/92     Pulse Rate 12/01/18 1039 64     Resp 12/01/18 1039 18     Temp 12/01/18 1039 98.3 F (36.8 C)     Temp Source 12/01/18 1039 Oral     SpO2 12/01/18 1039 100 %     Weight 12/01/18 1038 240 lb (108.9 kg)     Height --      Head Circumference --      Peak Flow --      Pain Score 12/01/18 1038 8     Pain Loc --      Pain Edu? --      Excl. in GC? --    No data found.  Updated Vital Signs BP (!) 146/92 (BP Location: Right Arm)   Pulse 64   Temp 98.3 F (36.8 C) (Oral)   Resp 18   Wt 108.9 kg   SpO2 100%   BMI 33.47 kg/m       Physical Exam Constitutional:      General: He is in acute distress.     Appearance: He is well-developed. He is obese.     Comments: Patient appears acutely uncomfortable.  He has stiff posture and neck movements  HENT:     Head: Normocephalic and atraumatic.  Eyes:     Conjunctiva/sclera: Conjunctivae normal.     Pupils: Pupils are equal, round, and reactive to light.  Neck:     Musculoskeletal: Normal range of motion.  Cardiovascular:     Rate and Rhythm: Normal rate.  Pulmonary:     Effort: Pulmonary effort is normal. No  respiratory distress.  Abdominal:     General: There is no distension.     Palpations: Abdomen is soft.  Musculoskeletal: Normal range of motion.     Comments: Tenderness bilaterally in the upper body of the trapezius and down the medial scapula with mild increased muscle tone.  No real spasm.  Strength sensation  range of motion reflexes are normal in the upper extremities  Skin:    General: Skin is warm and dry.  Neurological:     Mental Status: He is alert.     Sensory: No sensory deficit.     Motor: No weakness.     Gait: Gait normal.     Deep Tendon Reflexes: Reflexes normal.  Psychiatric:        Mood and Affect: Mood normal.        Behavior: Behavior normal.      UC Treatments / Results  Labs (all labs ordered are listed, but only abnormal results are displayed) Labs Reviewed - No data to display  EKG   Radiology No results found.  Procedures Procedures (including critical care time)  Medications Ordered in UC Medications - No data to display  Initial Impression / Assessment and Plan / UC Course  I have reviewed the triage vital signs and the nursing notes.  Pertinent labs & imaging results that were available during my care of the patient were reviewed by me and considered in my medical decision making (see chart for details).     Patient states his pain is worse than his prior injuries.  I will give him a limited number of hydrocodone.  We discussed that this can be addicting.  I did check the [employer].  He is also going to take ibuprofen for moderate pain and cyclobenzaprine as a muscle relaxer.  He is given a couple days off work and advised to follow-up with his PCP Final Clinical Impressions(s) / UC Diagnoses   Final diagnoses:  Muscle spasm of back  Acute bilateral thoracic back pain     Discharge Instructions     Ice or heat to area Take ibuprofen for moderate pain Take flexeril as needed, especially at night Take the hydrocodone  as needed for severe pain.  Do not drive on the hydrocodone Call your PCP if not improving in a couple of days   ED Prescriptions    Medication Sig Dispense Auth. Provider   cyclobenzaprine (FLEXERIL) 10 MG tablet Take 1 tablet (10 mg total) by mouth 2 (two) times daily as needed for muscle spasms. 20 tablet Eustace Moore, MD   HYDROcodone-acetaminophen [facility]) 7.5-325 MG tablet Take 1 tablet by mouth every 6 (six) hours as needed for moderate pain. 15 tablet Eustace Moore, MD     I have reviewed the PDMP during this encounter.   Eustace Moore, MD 12/01/18 2226

## 2018-12-19 ENCOUNTER — Other Ambulatory Visit: Payer: Self-pay

## 2018-12-19 DIAGNOSIS — Z20822 Contact with and (suspected) exposure to covid-19: Secondary | ICD-10-CM

## 2018-12-20 LAB — NOVEL CORONAVIRUS, NAA: SARS-CoV-2, NAA: NOT DETECTED

## 2019-01-05 ENCOUNTER — Other Ambulatory Visit: Payer: Self-pay

## 2019-01-05 DIAGNOSIS — Z20822 Contact with and (suspected) exposure to covid-19: Secondary | ICD-10-CM

## 2019-01-08 LAB — NOVEL CORONAVIRUS, NAA: SARS-CoV-2, NAA: NOT DETECTED

## 2019-02-12 ENCOUNTER — Ambulatory Visit (HOSPITAL_BASED_OUTPATIENT_CLINIC_OR_DEPARTMENT_OTHER): Payer: No Typology Code available for payment source | Admitting: Physician Assistant

## 2019-02-12 ENCOUNTER — Other Ambulatory Visit: Payer: Self-pay | Admitting: Internal Medicine

## 2019-02-12 ENCOUNTER — Other Ambulatory Visit: Payer: Self-pay

## 2019-02-12 ENCOUNTER — Other Ambulatory Visit (HOSPITAL_COMMUNITY)
Admission: RE | Admit: 2019-02-12 | Discharge: 2019-02-12 | Disposition: A | Discharge status: Home / Self Care | Payer: No Typology Code available for payment source | Source: Ambulatory Visit | Attending: Internal Medicine | Admitting: Internal Medicine

## 2019-02-12 VITALS — BP 149/81 | HR 65 | Temp 98.0°F | Ht 71.0 in | Wt 257.0 lb

## 2019-02-12 DIAGNOSIS — I10 Essential (primary) hypertension: Secondary | ICD-10-CM | POA: Diagnosis not present

## 2019-02-12 DIAGNOSIS — F329 Major depressive disorder, single episode, unspecified: Secondary | ICD-10-CM

## 2019-02-12 DIAGNOSIS — F419 Anxiety disorder, unspecified: Secondary | ICD-10-CM | POA: Diagnosis not present

## 2019-02-12 DIAGNOSIS — M62838 Other muscle spasm: Secondary | ICD-10-CM | POA: Diagnosis not present

## 2019-02-12 DIAGNOSIS — Z113 Encounter for screening for infections with a predominantly sexual mode of transmission: Secondary | ICD-10-CM | POA: Insufficient documentation

## 2019-02-12 DIAGNOSIS — E559 Vitamin D deficiency, unspecified: Secondary | ICD-10-CM

## 2019-02-12 DIAGNOSIS — F32A Depression, unspecified: Secondary | ICD-10-CM

## 2019-02-12 MED ORDER — CHLORTHALIDONE 25 MG PO TABS: 25.0000 mg | ORAL_TABLET | Freq: Every day | ORAL | 6 refills | Status: DC

## 2019-02-12 MED ORDER — AMLODIPINE BESYLATE 5 MG PO TABS: 5.0000 mg | ORAL_TABLET | Freq: Every day | ORAL | 6 refills | Status: DC

## 2019-02-12 MED ORDER — HYDROXYZINE HCL 25 MG PO TABS: 25.0000 mg | ORAL_TABLET | Freq: Three times a day (TID) | ORAL | 3 refills | Status: DC | PRN

## 2019-02-12 MED ORDER — OMEPRAZOLE 20 MG PO CPDR: 20.0000 mg | DELAYED_RELEASE_CAPSULE | Freq: Every day | ORAL | 3 refills | Status: DC

## 2019-02-12 MED ORDER — CYCLOBENZAPRINE HCL 10 MG PO TABS: 10.0000 mg | ORAL_TABLET | Freq: Every day | ORAL | 0 refills | Status: DC

## 2019-02-12 MED ORDER — BUSPIRONE HCL 15 MG PO TABS: 15.0000 mg | ORAL_TABLET | Freq: Two times a day (BID) | ORAL | 3 refills | Status: DC

## 2019-02-12 NOTE — Patient Instructions (Addendum)
Check blood pressure outside of the office 3-5 times weekly and record and bring to next visit.     Hypertension, Adult High blood pressure (hypertension) is when the force of blood pumping through the arteries is too strong. The arteries are the blood vessels that carry blood from the heart throughout the body. Hypertension forces the heart to work harder to pump blood and may cause arteries to become narrow or stiff. Untreated or uncontrolled hypertension can cause a heart attack, heart failure, a stroke, kidney disease, and other problems. A blood pressure reading consists of a higher number over a lower number. Ideally, your blood pressure should be below 120/80. The first ("top") number is called the systolic pressure. It is a measure of the pressure in your arteries as your heart beats. The second ("bottom") number is called the diastolic pressure. It is a measure of the pressure in your arteries as the heart relaxes. What are the causes? The exact cause of this condition is not known. There are some conditions that result in or are related to high blood pressure. What increases the risk? Some risk factors for high blood pressure are under your control. The following factors may make you more likely to develop this condition:  Smoking.  Having type 2 diabetes mellitus, high cholesterol, or both.  Not getting enough exercise or physical activity.  Being overweight.  Having too much fat, sugar, calories, or salt (sodium) in your diet.  Drinking too much alcohol. Some risk factors for high blood pressure may be difficult or impossible to change. Some of these factors include:  Having chronic kidney disease.  Having a family history of high blood pressure.  Age. Risk increases with age.  Race. You may be at higher risk if you are African American.  Gender. Men are at higher risk than women before age 75. After age 45, women are at higher risk than men.  Having obstructive sleep  apnea.  Stress. What are the signs or symptoms? High blood pressure may not cause symptoms. Very high blood pressure (hypertensive crisis) may cause:  Headache.  Anxiety.  Shortness of breath.  Nosebleed.  Nausea and vomiting.  Vision changes.  Severe chest pain.  Seizures. How is this diagnosed? This condition is diagnosed by measuring your blood pressure while you are seated, with your arm resting on a flat surface, your legs uncrossed, and your feet flat on the floor. The cuff of the blood pressure monitor will be placed directly against the skin of your upper arm at the level of your heart. It should be measured at least twice using the same arm. Certain conditions can cause a difference in blood pressure between your right and left arms. Certain factors can cause blood pressure readings to be lower or higher than normal for a short period of time:  When your blood pressure is higher when you are in a health care provider's office than when you are at home, this is called white coat hypertension. Most people with this condition do not need medicines.  When your blood pressure is higher at home than when you are in a health care provider's office, this is called masked hypertension. Most people with this condition may need medicines to control blood pressure. If you have a high blood pressure reading during one visit or you have normal blood pressure with other risk factors, you may be asked to:  Return on a different day to have your blood pressure checked again.  Monitor your  blood pressure at home for 1 week or longer. If you are diagnosed with hypertension, you may have other blood or imaging tests to help your health care provider understand your overall risk for other conditions. How is this treated? This condition is treated by making healthy lifestyle changes, such as eating healthy foods, exercising more, and reducing your alcohol intake. Your health care provider may  prescribe medicine if lifestyle changes are not enough to get your blood pressure under control, and if:  Your systolic blood pressure is above 130.  Your diastolic blood pressure is above 80. Your personal target blood pressure may vary depending on your medical conditions, your age, and other factors. Follow these instructions at home: Eating and drinking   Eat a diet that is high in fiber and potassium, and low in sodium, added sugar, and fat. An example eating plan is called the DASH (Dietary Approaches to Stop Hypertension) diet. To eat this way: ? Eat plenty of fresh fruits and vegetables. Try to fill one half of your plate at each meal with fruits and vegetables. ? Eat whole grains, such as whole-wheat pasta, brown rice, or whole-grain bread. Fill about one fourth of your plate with whole grains. ? Eat or drink low-fat dairy products, such as skim milk or low-fat yogurt. ? Avoid fatty cuts of meat, processed or cured meats, and poultry with skin. Fill about one fourth of your plate with lean proteins, such as fish, chicken without skin, beans, eggs, or tofu. ? Avoid pre-made and processed foods. These tend to be higher in sodium, added sugar, and fat.  Reduce your daily sodium intake. Most people with hypertension should eat less than 1,500 mg of sodium a day.  Do not drink alcohol if: ? Your health care provider tells you not to drink. ? You are pregnant, may be pregnant, or are planning to become pregnant.  If you drink alcohol: ? Limit how much you use to:  0-1 drink a day for women.  0-2 drinks a day for men. ? Be aware of how much alcohol is in your drink. In the [place] one drink equals one 12 oz bottle of beer (355 mL), one 5 oz glass of wine (148 mL), or one 1 oz glass of hard liquor (44 mL). Lifestyle   Work with your health care provider to maintain a healthy body weight or to lose weight. Ask what an ideal weight is for you.  Get at least 30 minutes of exercise  most days of the week. Activities may include walking, swimming, or biking.  Include exercise to strengthen your muscles (resistance exercise), such as Pilates or lifting weights, as part of your weekly exercise routine. Try to do these types of exercises for 30 minutes at least 3 days a week.  Do not use any products that contain nicotine or tobacco, such as cigarettes, e-cigarettes, and chewing tobacco. If you need help quitting, ask your health care provider.  Monitor your blood pressure at home as told by your health care provider.  Keep all follow-up visits as told by your health care provider. This is important. Medicines  Take over-the-counter and prescription medicines only as told by your health care provider. Follow directions carefully. Blood pressure medicines must be taken as prescribed.  Do not skip doses of blood pressure medicine. Doing this puts you at risk for problems and can make the medicine less effective.  Ask your health care provider about side effects or reactions to medicines that you  should watch for. Contact a health care provider if you:  Think you are having a reaction to a medicine you are taking.  Have headaches that keep coming back (recurring).  Feel dizzy.  Have swelling in your ankles.  Have trouble with your vision. Get help right away if you:  Develop a severe headache or confusion.  Have unusual weakness or numbness.  Feel faint.  Have severe pain in your chest or abdomen.  Vomit repeatedly.  Have trouble breathing. Summary  Hypertension is when the force of blood pumping through your arteries is too strong. If this condition is not controlled, it may put you at risk for serious complications.  Your personal target blood pressure may vary depending on your medical conditions, your age, and other factors. For most people, a normal blood pressure is less than 120/80.  Hypertension is treated with lifestyle changes, medicines, or a  combination of both. Lifestyle changes include losing weight, eating a healthy, low-sodium diet, exercising more, and limiting alcohol. This information is not intended to replace advice given to you by your health care provider. Make sure you discuss any questions you have with your health care provider. Document Released: 02/12/2005 Document Revised: 10/23/2017 Document Reviewed: 10/23/2017 Elsevier Patient Education  2020 [employer].

## 2019-02-12 NOTE — Progress Notes (Signed)
Patient is having pain in neck and arm. Patient is wanting STD testing.

## 2019-02-12 NOTE — Progress Notes (Signed)
Walter Howell, is a 33 y.o. male  KGM:[id number]  UYQ:[id number]  DOB - [birth year]  Subjective:  Chief Complaint and HPI: Walter Howell is a 33 y.o. male here and out of BP meds.  Denies CP/SOB/HA.    He also says the hydroxyzine is not fully effective for anxiety.  He has  Felt more anxious with all of the covid regulations and not able to get out as much.  He denies SI/HI.    He had HIV/RPR testing about 3 months ago which was negative.  He would like to do urine STD testing today but denies and s/sx.     Also says the  ROS:   Constitutional:  No f/c, No night sweats, No unexplained weight loss. EENT:  No vision changes, No blurry vision, No hearing changes. No mouth, throat, or ear problems.  Respiratory: No cough, No SOB Cardiac: No CP, no palpitations GI:  No abd pain, No N/V/D. GU: No Urinary s/sx Musculoskeletal: No joint pain Neuro: No headache, no dizziness, no motor weakness.  Skin: No rash Endocrine:  No polydipsia. No polyuria.  Psych: Denies SI/HI  No problems updated.  ALLERGIES: Allergies  Allergen Reactions  . Prednisone     "makes him feel weird"    PAST MEDICAL HISTORY: Past Medical History:  Diagnosis Date  . Anxiety   . Asthma   . GERD (gastroesophageal reflux disease)   . Hypertension     MEDICATIONS AT HOME: Prior to Admission medications   Medication Sig Start Date End Date Taking? Authorizing Provider  acetaminophen (TYLENOL) 500 MG tablet Take 1,000 mg by mouth every 6 (six) hours as needed for mild pain, moderate pain, fever or headache.   Yes [provider]  amLODipine (NORVASC) 5 MG tablet Take 1 tablet (5 mg total) by mouth daily. 02/12/19  Yes Freeman Caldron M, PA-C  chlorthalidone (HYGROTON) 25 MG tablet Take 1 tablet (25 mg total) by mouth daily. 02/12/19  Yes Freeman Caldron M, PA-C  cyclobenzaprine (FLEXERIL) 10 MG tablet Take 1 tablet (10 mg total) by mouth at bedtime. Prn muscle spasms 02/12/19  Yes Argentina Donovan, PA-C  hydrOXYzine (ATARAX/VISTARIL) 25 MG tablet Take 1 tablet (25 mg total) by mouth every 8 (eight) hours as needed. For anxiety 02/12/19  Yes McClung, Angela M, PA-C  ibuprofen (ADVIL) 600 MG tablet Take 1 tablet (600 mg total) by mouth every 6 (six) hours as needed. 11/20/18  Yes Melynda Ripple, MD  Menthol-Camphor (ICY HOT ADVANCED RELIEF) 16-11 % CREA Apply 1 application topically daily as needed (pain).   Yes [provider]  Multiple Vitamin (MULTIVITAMIN WITH MINERALS) TABS tablet Take 1 tablet by mouth daily.   Yes [provider]  omeprazole (PRILOSEC) 20 MG capsule Take 1 capsule (20 mg total) by mouth daily. 02/12/19  Yes McClung, Angela M, PA-C  busPIRone (BUSPAR) 15 MG tablet Take 1 tablet (15 mg total) by mouth 2 (two) times daily. 02/12/19   Argentina Donovan, PA-C  DULoxetine (CYMBALTA) 20 MG capsule Take 2 capsules (40 mg total) by mouth daily. Patient not taking: Reported on 02/12/2019 03/28/17   Alfonse Spruce, FNP     Objective:  EXAM:   Vitals:   02/12/19 1340  BP: (!) 149/81  Pulse: 65  Temp: 98 F (36.7 C)  TempSrc: Oral  SpO2: 95%  Weight: 257 lb (116.6 kg)  Height: 5\' 11"  (1.803 m)    General appearance : A&OX3. NAD. Non-toxic-appearing HEENT: Atraumatic and Normocephalic.  PERRLA. EOM intact.  TM clear B. Mouth-MMM, post pharynx WNL w/o erythema, No PND. Neck: supple, no JVD. No cervical lymphadenopathy. No thyromegaly Chest/Lungs:  Breathing-non-labored, Good air entry bilaterally, breath sounds normal without rales, rhonchi, or wheezing  CVS: S1 S2 regular, no murmurs, gallops, rubs  Abdomen: Bowel sounds present, Non tender and not distended with no gaurding, rigidity or rebound. Extremities: Bilateral Lower Ext shows no edema, both legs are warm to touch with = pulse throughout Neurology:  CN II-XII grossly intact, Non focal.   Psych:  TP linear. J/I WNL. Normal speech. Appropriate eye contact and affect.  Skin:  No Rash   Data Review Lab Results  Component Value Date   HGBA1C 5.5 07/19/2014   HGBA1C 5.6 03/24/2013     Assessment & Plan   1. Essential hypertension Not controlled-resume meds and check BP 3-5 times weekly OOO and record and bring to next visit.   - amLODipine (NORVASC) 5 MG tablet; Take 1 tablet (5 mg total) by mouth daily.  Dispense: 30 tablet; Refill: 6 - chlorthalidone (HYGROTON) 25 MG tablet; Take 1 tablet (25 mg total) by mouth daily.  Dispense: 30 tablet; Refill: 6 - Comprehensive metabolic panel - CBC with Differential  2. Anxiety and depression Can increase dose of hydroxyzine and add buspar for stabilizing anxiety.  Also advised self-care, proper sleep and exercise - hydrOXYzine (ATARAX/VISTARIL) 25 MG tablet; Take 1 tablet (25 mg total) by mouth every 8 (eight) hours as needed. For anxiety  Dispense: 30 tablet; Refill: 3 - busPIRone (BUSPAR) 15 MG tablet; Take 1 tablet (15 mg total) by mouth 2 (two) times daily.  Dispense: 60 tablet; Refill: 3 - TSH - Vitamin D, 25-hydroxy  3. Muscle spasm - cyclobenzaprine (FLEXERIL) 10 MG tablet; Take 1 tablet (10 mg total) by mouth at bedtime. Prn muscle spasms  Dispense: 20 tablet; Refill: 0 - TSH  4. Screening examination for STD (sexually transmitted disease) - Urine cytology ancillary only  5. Suspected Vitamin D-deficiency - Vitamin D, 25-hydroxy   Patient have been counseled extensively about nutrition and exercise  Return in about 1 month (around 03/15/2019) for with PCP for BP and anxiety.  The patient was given clear instructions to go to ER or return to medical center if symptoms don't improve, worsen or new problems develop. The patient verbalized understanding. The patient was told to call to get lab results if they haven't heard anything in the next week.     Georgian Co, PA-C [facility] [place], [place] [phone]   02/12/2019, 3:09 PMPatient ID: [name],  male   DOB: 1986-02-24, 33 y.o.   MRN: [id number]

## 2019-02-13 ENCOUNTER — Other Ambulatory Visit: Payer: Self-pay | Admitting: Physician Assistant

## 2019-02-13 DIAGNOSIS — F419 Anxiety disorder, unspecified: Secondary | ICD-10-CM

## 2019-02-13 DIAGNOSIS — F329 Major depressive disorder, single episode, unspecified: Secondary | ICD-10-CM

## 2019-02-13 DIAGNOSIS — F32A Depression, unspecified: Secondary | ICD-10-CM

## 2019-02-13 LAB — URINE CYTOLOGY ANCILLARY ONLY
Chlamydia: NEGATIVE
Comment: NEGATIVE
Comment: NEGATIVE
Comment: NORMAL
Neisseria Gonorrhea: NEGATIVE
Trichomonas: NEGATIVE

## 2019-02-13 LAB — CBC WITH DIFFERENTIAL/PLATELET
Basophils Absolute: 0 10*3/uL (ref 0.0–0.2)
Basos: 1 %
EOS (ABSOLUTE): 0.1 10*3/uL (ref 0.0–0.4)
Eos: 1 %
Hematocrit: 49.5 % (ref 37.5–51.0)
Hemoglobin: 16.9 g/dL (ref 13.0–17.7)
Immature Grans (Abs): 0 10*3/uL (ref 0.0–0.1)
Immature Granulocytes: 0 %
Lymphocytes Absolute: 1.7 10*3/uL (ref 0.7–3.1)
Lymphs: 34 %
MCH: 31.5 pg (ref 26.6–33.0)
MCHC: 34.1 g/dL (ref 31.5–35.7)
MCV: 92 fL (ref 79–97)
Monocytes Absolute: 0.3 10*3/uL (ref 0.1–0.9)
Monocytes: 7 %
Neutrophils Absolute: 2.8 10*3/uL (ref 1.4–7.0)
Neutrophils: 57 %
Platelets: 243 10*3/uL (ref 150–450)
RBC: 5.37 x10E6/uL (ref 4.14–5.80)
RDW: 14.4 % (ref 11.6–15.4)
WBC: 4.9 10*3/uL (ref 3.4–10.8)

## 2019-02-13 LAB — COMPREHENSIVE METABOLIC PANEL
ALT: 41 IU/L (ref 0–44)
AST: 24 IU/L (ref 0–40)
Albumin/Globulin Ratio: 1.7 (ref 1.2–2.2)
Albumin: 4.3 g/dL (ref 4.0–5.0)
Alkaline Phosphatase: 46 IU/L (ref 39–117)
BUN/Creatinine Ratio: 14 (ref 9–20)
BUN: 13 mg/dL (ref 6–20)
Bilirubin Total: 0.4 mg/dL (ref 0.0–1.2)
CO2: 25 mmol/L (ref 20–29)
Calcium: 9.5 mg/dL (ref 8.7–10.2)
Chloride: 100 mmol/L (ref 96–106)
Creatinine, Ser: 0.96 mg/dL (ref 0.76–1.27)
GFR calc Af Amer: 120 mL/min/{1.73_m2} (ref >59)
GFR calc non Af Amer: 104 mL/min/{1.73_m2} (ref >59)
Globulin, Total: 2.5 g/dL (ref 1.5–4.5)
Glucose: 91 mg/dL (ref 65–99)
Potassium: 4 mmol/L (ref 3.5–5.2)
Sodium: 141 mmol/L (ref 134–144)
Total Protein: 6.8 g/dL (ref 6.0–8.5)

## 2019-02-13 LAB — TSH: TSH: 1.17 u[IU]/mL (ref 0.450–4.500)

## 2019-02-13 LAB — VITAMIN D 25 HYDROXY (VIT D DEFICIENCY, FRACTURES): Vit D, 25-Hydroxy: 14.6 ng/mL — ABNORMAL LOW (ref 30.0–100.0)

## 2019-02-17 ENCOUNTER — Other Ambulatory Visit: Payer: Self-pay | Admitting: Physician Assistant

## 2019-02-17 MED ORDER — VITAMIN D (ERGOCALCIFEROL) 1.25 MG (50000 UNIT) PO CAPS: 50000.0000 [IU] | ORAL_CAPSULE | ORAL | 0 refills | Status: DC

## 2019-04-21 ENCOUNTER — Other Ambulatory Visit: Payer: Self-pay

## 2019-04-21 ENCOUNTER — Encounter (HOSPITAL_COMMUNITY): Payer: Self-pay

## 2019-04-21 ENCOUNTER — Ambulatory Visit (HOSPITAL_COMMUNITY)
Admission: EM | Admit: 2019-04-21 | Discharge: 2019-04-21 | Disposition: A | Discharge status: Home / Self Care | Payer: 59 | Attending: Urgent Care | Admitting: Urgent Care

## 2019-04-21 ENCOUNTER — Ambulatory Visit (INDEPENDENT_AMBULATORY_CARE_PROVIDER_SITE_OTHER): Payer: 59

## 2019-04-21 DIAGNOSIS — Z888 Allergy status to other drugs, medicaments and biological substances status: Secondary | ICD-10-CM | POA: Insufficient documentation

## 2019-04-21 DIAGNOSIS — R059 Cough, unspecified: Secondary | ICD-10-CM

## 2019-04-21 DIAGNOSIS — R0602 Shortness of breath: Secondary | ICD-10-CM | POA: Diagnosis not present

## 2019-04-21 DIAGNOSIS — R05 Cough: Secondary | ICD-10-CM | POA: Insufficient documentation

## 2019-04-21 DIAGNOSIS — Z20822 Contact with and (suspected) exposure to covid-19: Secondary | ICD-10-CM | POA: Insufficient documentation

## 2019-04-21 DIAGNOSIS — I10 Essential (primary) hypertension: Secondary | ICD-10-CM | POA: Diagnosis not present

## 2019-04-21 DIAGNOSIS — R0789 Other chest pain: Secondary | ICD-10-CM

## 2019-04-21 DIAGNOSIS — R0981 Nasal congestion: Secondary | ICD-10-CM | POA: Insufficient documentation

## 2019-04-21 DIAGNOSIS — Z87891 Personal history of nicotine dependence: Secondary | ICD-10-CM | POA: Insufficient documentation

## 2019-04-21 DIAGNOSIS — K219 Gastro-esophageal reflux disease without esophagitis: Secondary | ICD-10-CM | POA: Diagnosis not present

## 2019-04-21 MED ORDER — ALBUTEROL SULFATE HFA 108 (90 BASE) MCG/ACT IN AERS
2.0000 | INHALATION_SPRAY | Freq: Once | RESPIRATORY_TRACT | Status: AC
  Administered 2019-04-21: 13:00:00 2 via RESPIRATORY_TRACT

## 2019-04-21 MED ORDER — ALBUTEROL SULFATE HFA 108 (90 BASE) MCG/ACT IN AERS
INHALATION_SPRAY | RESPIRATORY_TRACT | Status: AC
  Filled 2019-04-21: qty 6.7

## 2019-04-21 NOTE — ED Provider Notes (Signed)
[facility]    CSN: [id number] Arrival date & time: 04/21/19  1045      History   Chief Complaint Chief Complaint  Patient presents with  . Shortness of Breath  . Cough  . Nasal Congestion  . Chest Pain    HPI Tong Pieczynski is a 34 y.o. male.   Patient is a 33-year male past medical history of anxiety, asthma, GERD, hypertension.  He presents today with approximately 2 to 3 days of sharp central chest pain radiating to bilateral shoulders and upper back with cough, nasal congestion.  Symptoms been constant, waxing waning.  He has been taking Tylenol and ibuprofen with some relief of his symptoms.  Pain is worse with certain movements and deep breathing.  Denies any associated fever, chills, bodies or night sweats.  Recent travel to [place].  No known sick contacts or Covid exposures.  Denies any heavy lifting or injuries.  Denies any history of DVT or PE.  No lower extremity swelling or pain.  ROS per HPI      Past Medical History:  Diagnosis Date  . Anxiety   . Asthma   . GERD (gastroesophageal reflux disease)   . Hypertension     Patient Active Problem List   Diagnosis Date Noted  . Benign paroxysmal positional vertigo 07/23/2017  . TMJ (temporomandibular joint disorder) 07/23/2017  . Gastroesophageal reflux disease without esophagitis 07/23/2017  . History of herniated intervertebral disc 12/25/2016  . Palpitations 12/25/2016  . Anxiety and depression 12/06/2015  . Chronic bilateral low back pain without sciatica 12/06/2015  . Essential hypertension 07/08/2013    History reviewed. No pertinent surgical history.     Home Medications    Prior to Admission medications   Medication Sig Start Date End Date Taking? Authorizing Provider  acetaminophen (TYLENOL) 500 MG tablet Take 1,000 mg by mouth every 6 (six) hours as needed for mild pain, moderate pain, fever or headache.    [provider]  amLODipine (NORVASC) 5 MG tablet Take 1  tablet (5 mg total) by mouth daily. 02/12/19   Anders Simmonds, PA-C  busPIRone (BUSPAR) 15 MG tablet Take 1 tablet (15 mg total) by mouth 2 (two) times daily. 02/12/19   Anders Simmonds, PA-C  chlorthalidone (HYGROTON) 25 MG tablet Take 1 tablet (25 mg total) by mouth daily. 02/12/19   Anders Simmonds, PA-C  cyclobenzaprine (FLEXERIL) 10 MG tablet Take 1 tablet (10 mg total) by mouth at bedtime. Prn muscle spasms 02/12/19   Georgian Co M, PA-C  DULoxetine (CYMBALTA) 20 MG capsule Take 2 capsules (40 mg total) by mouth daily. Patient not taking: Reported on 02/12/2019 03/28/17   Lizbeth Bark, FNP  hydrOXYzine (ATARAX/VISTARIL) 25 MG tablet Take 1 tablet (25 mg total) by mouth every 8 (eight) hours as needed. For anxiety 02/12/19   Anders Simmonds, PA-C  ibuprofen (ADVIL) 600 MG tablet Take 1 tablet (600 mg total) by mouth every 6 (six) hours as needed. 11/20/18   Domenick Gong, MD  Menthol-Camphor (ICY HOT ADVANCED RELIEF) 16-11 % CREA Apply 1 application topically daily as needed (pain).    [provider]  Multiple Vitamin (MULTIVITAMIN WITH MINERALS) TABS tablet Take 1 tablet by mouth daily.    [provider]  omeprazole (PRILOSEC) 20 MG capsule Take 1 capsule (20 mg total) by mouth daily. 02/12/19   Anders Simmonds, PA-C  Vitamin D, Ergocalciferol, (DRISDOL) 1.25 MG (50000 UT) CAPS capsule Take 1 capsule (50,000 Units  total) by mouth every 7 (seven) days. 02/17/19   Argentina Donovan, PA-C    Family History Family History  Problem Relation Age of Onset  . Diabetes Maternal Grandmother   . Hypertension Maternal Grandmother   . Diabetes Maternal Grandfather   . Diabetes Paternal Grandmother   . Heart disease Paternal Grandmother   . Diabetes Paternal Grandfather     Social History Social History   Tobacco Use  . Smoking status: Former Research scientist (life sciences)  . Smokeless tobacco: Never Used  Substance Use Topics  . Alcohol use: No  . Drug use: No      Allergies   Prednisone   Review of Systems Review of Systems   Physical Exam Triage Vital Signs ED Triage Vitals  Enc Vitals Group     BP 04/21/19 1148 (!) 142/87     Pulse Rate 04/21/19 1148 86     Resp 04/21/19 1148 19     Temp 04/21/19 1148 98.3 F (36.8 C)     Temp Source 04/21/19 1148 Oral     SpO2 04/21/19 1148 96 %     Weight --      Height --      Head Circumference --      Peak Flow --      Pain Score 04/21/19 1146 8     Pain Loc --      Pain Edu? --      Excl. in [place]? --    No data found.  Updated Vital Signs BP (!) 142/87 (BP Location: Left Arm)   Pulse 86   Temp 98.3 F (36.8 C) (Oral)   Resp 19   SpO2 96%   Visual Acuity Right Eye Distance:   Left Eye Distance:   Bilateral Distance:    Right Eye Near:   Left Eye Near:    Bilateral Near:     Physical Exam Vitals and nursing note reviewed.  Constitutional:      General: He is not in acute distress.    Appearance: Normal appearance. He is not ill-appearing, toxic-appearing or diaphoretic.  HENT:     Head: Normocephalic and atraumatic.     Nose: Nose normal.  Eyes:     Conjunctiva/sclera: Conjunctivae normal.  Cardiovascular:     Rate and Rhythm: Normal rate and regular rhythm.  Pulmonary:     Effort: Pulmonary effort is normal.     Breath sounds: Normal breath sounds.  Musculoskeletal:        General: Normal range of motion.     Cervical back: Normal range of motion.  Skin:    General: Skin is warm and dry.  Neurological:     Mental Status: He is alert.  Psychiatric:        Mood and Affect: Mood normal.      UC Treatments / Results  Labs (all labs ordered are listed, but only abnormal results are displayed) Labs Reviewed  NOVEL CORONAVIRUS, NAA (HOSP ORDER, SEND-OUT TO REF LAB; TAT 18-24 HRS)    EKG   Radiology DG Chest 2 View  Result Date: 04/21/2019 CLINICAL DATA:  Chest pain, shortness of breath EXAM: CHEST - 2 VIEW COMPARISON:  09/03/2016 FINDINGS: There is  mild lingular atelectasis. There is no focal consolidation. There is no pleural effusion or pneumothorax. The heart and mediastinal contours are unremarkable. There is no acute osseous abnormality. IMPRESSION: No active cardiopulmonary disease. Electronically Signed   By: Kathreen Devoid   On: 04/21/2019 12:37    Procedures Procedures (  including critical care time)  Medications Ordered in UC Medications  albuterol (VENTOLIN HFA) 108 (90 Base) MCG/ACT inhaler 2 puff (2 puffs Inhalation Given 04/21/19 1234)    Initial Impression / Assessment and Plan / UC Course  I have reviewed the triage vital signs and the nursing notes.  Pertinent labs & imaging results that were available during my care of the patient were reviewed by me and considered in my medical decision making (see chart for details).     Chest wall pain and cough. EKG with normal sinus rhythm and normal rate.  Chest x-ray normal Pain is most likely musculoskeletal or body aches related to some sort of viral illness. Covid swab sent for testing with labs pending. Recommended Tylenol and ibuprofen for discomfort Follow up as needed for continued or worsening symptoms  Final Clinical Impressions(s) / UC Diagnoses   Final diagnoses:  Chest wall pain  Cough     Discharge Instructions     Your EKG and chest x-ray were normal This is most likely musculoskeletal chest wall pain. Based on the cough this could be some sort of viral illness.  We have swabbed you for Covid and will call with any positive results You can take ibuprofen or Tylenol for your symptoms Follow up as needed for continued or worsening symptoms     ED Prescriptions    None     PDMP not reviewed this encounter.   Janace Aris, NP 04/21/19 1302

## 2019-04-21 NOTE — ED Triage Notes (Signed)
Pt presents to UC with sharp chest pain x 2-3 days radiates to shoulders; cough, nasal congestion x 3 days.

## 2019-04-21 NOTE — Discharge Instructions (Addendum)
Your EKG and chest x-ray were normal This is most likely musculoskeletal chest wall pain. Based on the cough this could be some sort of viral illness.  We have swabbed you for Covid and will call with any positive results You can take ibuprofen or Tylenol for your symptoms Follow up as needed for continued or worsening symptoms

## 2019-04-23 LAB — NOVEL CORONAVIRUS, NAA (HOSP ORDER, SEND-OUT TO REF LAB; TAT 18-24 HRS): SARS-CoV-2, NAA: NOT DETECTED

## 2019-05-02 ENCOUNTER — Other Ambulatory Visit: Payer: Self-pay | Admitting: Physician Assistant

## 2019-05-02 DIAGNOSIS — I10 Essential (primary) hypertension: Secondary | ICD-10-CM

## 2019-05-21 ENCOUNTER — Other Ambulatory Visit: Payer: Self-pay

## 2019-05-21 ENCOUNTER — Other Ambulatory Visit (HOSPITAL_COMMUNITY)
Admission: RE | Admit: 2019-05-21 | Discharge: 2019-05-21 | Disposition: A | Discharge status: Home / Self Care | Payer: 59 | Source: Ambulatory Visit | Attending: Internal Medicine | Admitting: Internal Medicine

## 2019-05-21 ENCOUNTER — Ambulatory Visit: Payer: 59 | Admitting: Physician Assistant

## 2019-05-21 VITALS — BP 130/87 | HR 64 | Temp 98.4°F | Resp 18 | Ht 71.0 in | Wt 263.0 lb

## 2019-05-21 DIAGNOSIS — I10 Essential (primary) hypertension: Secondary | ICD-10-CM

## 2019-05-21 DIAGNOSIS — Z113 Encounter for screening for infections with a predominantly sexual mode of transmission: Secondary | ICD-10-CM | POA: Insufficient documentation

## 2019-05-21 DIAGNOSIS — F419 Anxiety disorder, unspecified: Secondary | ICD-10-CM

## 2019-05-21 DIAGNOSIS — F32A Depression, unspecified: Secondary | ICD-10-CM

## 2019-05-21 DIAGNOSIS — G4700 Insomnia, unspecified: Secondary | ICD-10-CM

## 2019-05-21 DIAGNOSIS — F329 Major depressive disorder, single episode, unspecified: Secondary | ICD-10-CM

## 2019-05-21 DIAGNOSIS — K0889 Other specified disorders of teeth and supporting structures: Secondary | ICD-10-CM

## 2019-05-21 DIAGNOSIS — E559 Vitamin D deficiency, unspecified: Secondary | ICD-10-CM

## 2019-05-21 DIAGNOSIS — H8110 Benign paroxysmal vertigo, unspecified ear: Secondary | ICD-10-CM

## 2019-05-21 MED ORDER — MECLIZINE HCL 12.5 MG PO TABS: 12.5000 mg | ORAL_TABLET | Freq: Three times a day (TID) | ORAL | 0 refills | Status: DC | PRN

## 2019-05-21 MED ORDER — IBUPROFEN 800 MG PO TABS: 800.0000 mg | ORAL_TABLET | Freq: Three times a day (TID) | ORAL | 0 refills | Status: DC | PRN

## 2019-05-21 NOTE — Progress Notes (Signed)
Established Patient Office Visit  Subjective:  Patient ID: Walter Howell, male    DOB: 1985-05-16  Age: 34 y.o. MRN: [id number]  CC:  Chief Complaint  Patient presents with  . Hypertension   HPI Walter Howell reports he has been having increased anxiety, states that he is not taking BuSpar or Cymbalta, did not feel they were offering any relief, would prefer not to use medications to help him manage his anxiety.  Reports that he is having difficulty falling asleep and staying asleep, states that he tosses and turns most of the night.  Reports that he will wake up with anxiety symptoms of mind racing and heart racing.  Reports that he has been taking hydroxyzine 25 mg before bed without relief.  Reports that he will place stress relief music, use aromatherapy.  Has previously had counseling, would like to restart.  Reports that he has not been taking the vitamin D 50,000 units once a week, states that he purchased some over-the-counter, is taking 1000 units 3-4 times a week.  Reports that he continues to have dizziness, states that he is well-hydrated, dizziness can occur at any time whether he is standing or sitting   Reports that he purchased a blood pressure cuff and is going to start keeping a log of his blood pressure, states that he took 3 tablespoons of apple cider vinegar this morning and believes that is what is helping his blood pressure, states he is compliant to his blood pressure medications.  Request STI screening.  Denies any symptoms  Reports that he been having pain in his mouth due to dental needs, states that ibuprofen has been offering relief, request prescription for ibuprofen 800.  States that he is compliant to Prilosec.  Has upcoming dental appointment.    Past Medical History:  Diagnosis Date  . Anxiety   . Asthma   . GERD (gastroesophageal reflux disease)   . Hypertension     History reviewed. No pertinent surgical history.  Family History   Problem Relation Age of Onset  . Diabetes Maternal Grandmother   . Hypertension Maternal Grandmother   . Diabetes Maternal Grandfather   . Diabetes Paternal Grandmother   . Heart disease Paternal Grandmother   . Diabetes Paternal Grandfather     Social History   Socioeconomic History  . Marital status: Single    Spouse name: Not on file  . Number of children: Not on file  . Years of education: Not on file  . Highest education level: Not on file  Occupational History  . Not on file  Tobacco Use  . Smoking status: Former Games developer  . Smokeless tobacco: Never Used  Substance and Sexual Activity  . Alcohol use: No  . Drug use: No  . Sexual activity: Not on file  Other Topics Concern  . Not on file  Social History Narrative  . Not on file   Social Determinants of Health   Financial Resource Strain:   . Difficulty of Paying Living Expenses:   Food Insecurity:   . Worried About Programme researcher, broadcasting/film/video in the Last Year:   . Barista in the Last Year:   Transportation Needs:   . Freight forwarder (Medical):   Marland Kitchen Lack of Transportation (Non-Medical):   Physical Activity:   . Days of Exercise per Week:   . Minutes of Exercise per Session:   Stress:   . Feeling of Stress :   Social Connections:   .  Frequency of Communication with Friends and Family:   . Frequency of Social Gatherings with Friends and Family:   . Attends Religious Services:   . Active Member of Clubs or Organizations:   . Attends Banker Meetings:   Marland Kitchen Marital Status:   Intimate Partner Violence:   . Fear of Current or Ex-Partner:   . Emotionally Abused:   Marland Kitchen Physically Abused:   . Sexually Abused:     Outpatient Medications Prior to Visit  Medication Sig Dispense Refill  . acetaminophen (TYLENOL) 500 MG tablet Take 1,000 mg by mouth every 6 (six) hours as needed for mild pain, moderate pain, fever or headache.    . busPIRone (BUSPAR) 15 MG tablet Take 1 tablet (15 mg total) by mouth  2 (two) times daily. 60 tablet 3  . chlorthalidone (HYGROTON) 25 MG tablet Take 1 tablet (25 mg total) by mouth daily. Must have office visit for refills 30 tablet 0  . cyclobenzaprine (FLEXERIL) 10 MG tablet Take 1 tablet (10 mg total) by mouth at bedtime. Prn muscle spasms 20 tablet 0  . hydrOXYzine (ATARAX/VISTARIL) 25 MG tablet Take 1 tablet (25 mg total) by mouth every 8 (eight) hours as needed. For anxiety 30 tablet 3  . Menthol-Camphor (ICY HOT ADVANCED RELIEF) 16-11 % CREA Apply 1 application topically daily as needed (pain).    . Multiple Vitamin (MULTIVITAMIN WITH MINERALS) TABS tablet Take 1 tablet by mouth daily.    Marland Kitchen omeprazole (PRILOSEC) 20 MG capsule Take 1 capsule (20 mg total) by mouth daily. 30 capsule 3  . Vitamin D, Ergocalciferol, (DRISDOL) 1.25 MG (50000 UT) CAPS capsule Take 1 capsule (50,000 Units total) by mouth every 7 (seven) days. 16 capsule 0  . amLODipine (NORVASC) 5 MG tablet Take 1 tablet (5 mg total) by mouth daily. (Patient not taking: Reported on 05/21/2019) 30 tablet 6  . DULoxetine (CYMBALTA) 20 MG capsule Take 2 capsules (40 mg total) by mouth daily. (Patient not taking: Reported on 02/12/2019) 60 capsule 2  . ibuprofen (ADVIL) 600 MG tablet Take 1 tablet (600 mg total) by mouth every 6 (six) hours as needed. (Patient not taking: Reported on 05/21/2019) 30 tablet 0   No facility-administered medications prior to visit.    Allergies  Allergen Reactions  . Prednisone     "makes him feel weird"    ROS Review of Systems  Constitutional: Negative.   HENT: Negative.   Eyes: Negative.  Negative for visual disturbance.  Respiratory: Negative.   Cardiovascular: Negative.   Gastrointestinal: Negative.   Endocrine: Negative.   Genitourinary: Negative.   Musculoskeletal: Negative.   Skin: Negative.   Allergic/Immunologic: Negative.   Neurological: Positive for dizziness. Negative for syncope.  Hematological: Negative.   Psychiatric/Behavioral: Positive for  sleep disturbance. Negative for dysphoric mood, self-injury and suicidal ideas. The patient is nervous/anxious.       Objective:    Physical Exam  Constitutional: He is oriented to person, place, and time. He appears well-developed and well-nourished. No distress.  HENT:  Head: Normocephalic and atraumatic.  Right Ear: Tympanic membrane and external ear normal.  Left Ear: Tympanic membrane and external ear normal.  Nose: Nose normal.  Mouth/Throat: Oropharynx is clear and moist.  Eyes: Pupils are equal, round, and reactive to light. Conjunctivae are normal.  Neck: No thyromegaly present.  Cardiovascular: Normal rate, regular rhythm, normal heart sounds and intact distal pulses.  Pulmonary/Chest: Effort normal and breath sounds normal.  Abdominal: Soft. Bowel sounds are normal.  Musculoskeletal:        General: Normal range of motion.     Cervical back: Normal range of motion and neck supple.  Neurological: He is alert and oriented to person, place, and time. He has normal reflexes.  Skin: Skin is warm and dry.  Psychiatric: He has a normal mood and affect. His behavior is normal. Judgment and thought content normal.  Nursing note and vitals reviewed.   BP 130/87 (BP Location: Left Arm, Patient Position: Sitting, Cuff Size: Large)   Pulse 64   Temp 98.4 F (36.9 C) (Temporal)   Resp 18   Ht 5\' 11"  (1.803 m)   Wt 263 lb (119.3 kg)   SpO2 95%   BMI 36.68 kg/m  Wt Readings from Last 3 Encounters:  05/21/19 263 lb (119.3 kg)  02/12/19 257 lb (116.6 kg)  12/01/18 240 lb (108.9 kg)     Health Maintenance Due  Topic Date Due  . TETANUS/TDAP  Never done    There are no preventive care reminders to display for this patient.  Lab Results  Component Value Date   TSH 1.170 02/12/2019   Lab Results  Component Value Date   WBC 4.9 02/12/2019   HGB 16.9 02/12/2019   HCT 49.5 02/12/2019   MCV 92 02/12/2019   PLT 243 02/12/2019   Lab Results  Component Value Date   NA  141 02/12/2019   K 4.0 02/12/2019   CO2 25 02/12/2019   GLUCOSE 91 02/12/2019   BUN 13 02/12/2019   CREATININE 0.96 02/12/2019   BILITOT 0.4 02/12/2019   ALKPHOS 46 02/12/2019   AST 24 02/12/2019   ALT 41 02/12/2019   PROT 6.8 02/12/2019   ALBUMIN 4.3 02/12/2019   CALCIUM 9.5 02/12/2019   ANIONGAP 9 01/18/2017   No results found for: CHOL No results found for: HDL No results found for: LDLCALC No results found for: TRIG No results found for: CHOLHDL Lab Results  Component Value Date   HGBA1C 5.5 07/19/2014      Assessment & Plan:   Problem List Items Addressed This Visit      Cardiovascular and Mediastinum   Essential hypertension (Chronic)     Nervous and Auditory   Benign paroxysmal positional vertigo   Relevant Medications   meclizine (ANTIVERT) 12.5 MG tablet   Other Relevant Orders   Ambulatory referral to ENT     Other   Anxiety and depression (Chronic)    Other Visit Diagnoses    Screen for STD (sexually transmitted disease)    -  Primary   Relevant Orders   Urine cytology ancillary only   Tooth pain       Relevant Medications   ibuprofen (ADVIL) 800 MG tablet   Vitamin D deficiency       Insomnia, unspecified type          Meds ordered this encounter  Medications  . meclizine (ANTIVERT) 12.5 MG tablet    Sig: Take 1 tablet (12.5 mg total) by mouth 3 (three) times daily as needed for dizziness.    Dispense:  30 tablet    Refill:  0    Order Specific Question:   Supervising Provider    Answer:   Joya Gaskins, PATRICK E [[id number]]  . ibuprofen (ADVIL) 800 MG tablet    Sig: Take 1 tablet (800 mg total) by mouth every 8 (eight) hours as needed.    Dispense:  30 tablet    Refill:  0    Order  Specific Question:   Supervising Provider    Answer:   Storm Frisk [[id number]]  1. Screen for STD (sexually transmitted disease) Per patient request - Urine cytology ancillary only  2. Essential hypertension The current medical regimen is effective;  continue  present plan and medications.  3. Anxiety and depression  trial of melatonin along with  hydroxyzine to help with insomnia.  encouraged  to take the vitamin D 50,000 units once a week for 12 weeks as prescribed, follow Mediterranean-style diet, fish oil and folic acid over-the-counter can also be very helpful with anxiety.  Refer for cognitive behavioral therapy   4. Tooth pain Continue with dental appt, caution given on overuse of ibuprofen - ibuprofen (ADVIL) 800 MG tablet; Take 1 tablet (800 mg total) by mouth every 8 (eight) hours as needed.  Dispense: 30 tablet; Refill: 0  5. Benign paroxysmal positional vertigo, unspecified laterality Continue with hydration, refer for further evaluation, trial meclizine - Ambulatory referral to ENT - meclizine (ANTIVERT) 12.5 MG tablet; Take 1 tablet (12.5 mg total) by mouth 3 (three) times daily as needed for dizziness.  Dispense: 30 tablet; Refill: 0  6. Vitamin D deficiency Gave patient ed on proper use of vit d script  7. Insomnia, unspecified type    I have reviewed the patient's medical history (PMH, PSH, Social History, Family History, Medications, and allergies) , and have been updated if relevant. I spent 30 minutes reviewing chart and  face to face time with patient.    Follow-up: Return in about 4 weeks (around 06/18/2019).    Kasandra Knudsen Raye Slyter, PA-C

## 2019-05-21 NOTE — Patient Instructions (Addendum)
As we discussed in order to help you with your anxiety, you need to work on improving your sleep.  I suggest a trial of melatonin along with your hydroxyzine to help you with insomnia.  I encourage you to take the vitamin D 50,000 units once a week for 12 weeks as prescribed, follow Mediterranean-style diet, fish oil and folic acid over-the-counter can also be very helpful with anxiety.  I am going to place an order for you for cognitive behavioral therapy.   For your dizziness I am sending a referral for you to be followed up with ear nose and throat, and I sent a prescription for meclizine to your pharmacy that you can use as needed.  For your tooth pain, keep follow-up appointment with dentist, I sent a prescription for ibuprofen 800 mg to your pharmacy, make sure to continue taking your Prilosec.   Mediterranean Diet A Mediterranean diet refers to food and lifestyle choices that are based on the traditions of countries located on the [employer]. This way of eating has been shown to help prevent certain conditions and improve outcomes for people who have chronic diseases, like kidney disease and heart disease. What are tips for following this plan? Lifestyle  Cook and eat meals together with your family, when possible.  Drink enough fluid to keep your urine clear or pale yellow.  Be physically active every day. This includes: ? Aerobic exercise like running or swimming. ? Leisure activities like gardening, walking, or housework.  Get 7-8 hours of sleep each night.  If recommended by your health care provider, drink red wine in moderation. This means 1 glass a day for nonpregnant women and 2 glasses a day for men. A glass of wine equals 5 oz (150 mL). Reading food labels   Check the serving size of packaged foods. For foods such as rice and pasta, the serving size refers to the amount of cooked product, not dry.  Check the total fat in packaged foods. Avoid foods that have  saturated fat or trans fats.  Check the ingredients list for added sugars, such as corn syrup. Shopping  At the grocery store, buy most of your food from the areas near the walls of the store. This includes: ? Fresh fruits and vegetables (produce). ? Grains, beans, nuts, and seeds. Some of these may be available in unpackaged forms or large amounts (in bulk). ? Fresh seafood. ? Poultry and eggs. ? Low-fat dairy products.  Buy whole ingredients instead of prepackaged foods.  Buy fresh fruits and vegetables in-season from local farmers markets.  Buy frozen fruits and vegetables in resealable bags.  If you do not have access to quality fresh seafood, buy precooked frozen shrimp or canned fish, such as tuna, salmon, or sardines.  Buy small amounts of raw or cooked vegetables, salads, or olives from the deli or salad bar at your store.  Stock your pantry so you always have certain foods on hand, such as olive oil, canned tuna, canned tomatoes, rice, pasta, and beans. Cooking  Cook foods with extra-virgin olive oil instead of using butter or other vegetable oils.  Have meat as a side dish, and have vegetables or grains as your main dish. This means having meat in small portions or adding small amounts of meat to foods like pasta or stew.  Use beans or vegetables instead of meat in common dishes like chili or lasagna.  Experiment with different cooking methods. Try roasting or broiling vegetables instead of steaming or  sauteing them.  Add frozen vegetables to soups, stews, pasta, or rice.  Add nuts or seeds for added healthy fat at each meal. You can add these to yogurt, salads, or vegetable dishes.  Marinate fish or vegetables using olive oil, lemon juice, garlic, and fresh herbs. Meal planning   Plan to eat 1 vegetarian meal one day each week. Try to work up to 2 vegetarian meals, if possible.  Eat seafood 2 or more times a week.  Have healthy snacks readily available, such  as: ? Vegetable sticks with hummus. ? [place] yogurt. ? Fruit and nut trail mix.  Eat balanced meals throughout the week. This includes: ? Fruit: 2-3 servings a day ? Vegetables: 4-5 servings a day ? Low-fat dairy: 2 servings a day ? Fish, poultry, or lean meat: 1 serving a day ? Beans and legumes: 2 or more servings a week ? Nuts and seeds: 1-2 servings a day ? Whole grains: 6-8 servings a day ? Extra-virgin olive oil: 3-4 servings a day  Limit red meat and sweets to only a few servings a month What are my food choices?  Mediterranean diet ? Recommended  Grains: Whole-grain pasta. Brown rice. Bulgar wheat. Polenta. Couscous. Whole-wheat bread. Orpah Cobb.  Vegetables: Artichokes. Beets. Broccoli. Cabbage. Carrots. Eggplant. Green beans. Chard. Kale. Spinach. Onions. Leeks. Peas. Squash. Tomatoes. Peppers. Radishes.  Fruits: Apples. Apricots. Avocado. Berries. Bananas. Cherries. Dates. Figs. Grapes. Lemons. Melon. Oranges. Peaches. Plums. Pomegranate.  Meats and other protein foods: Beans. Almonds. Sunflower seeds. Pine nuts. Peanuts. Cod. Salmon. Scallops. Shrimp. Tuna. Tilapia. Clams. Oysters. Eggs.  Dairy: Low-fat milk. Cheese. Greek yogurt.  Beverages: Water. Red wine. Herbal tea.  Fats and oils: Extra virgin olive oil. Avocado oil. Grape seed oil.  Sweets and desserts: [place] yogurt with honey. Baked apples. Poached pears. Trail mix.  Seasoning and other foods: Basil. Cilantro. Coriander. Cumin. Mint. Parsley. Sage. Rosemary. Tarragon. Garlic. Oregano. Thyme. Pepper. Balsalmic vinegar. Tahini. Hummus. Tomato sauce. Olives. Mushrooms. ? Limit these  Grains: Prepackaged pasta or rice dishes. Prepackaged cereal with added sugar.  Vegetables: Deep fried potatoes (french fries).  Fruits: Fruit canned in syrup.  Meats and other protein foods: Beef. Pork. Lamb. Poultry with skin. Hot dogs. Tomasa Blase.  Dairy: Ice cream. Sour cream. Whole milk.  Beverages: Juice.  Sugar-sweetened soft drinks. Beer. Liquor and spirits.  Fats and oils: Butter. Canola oil. Vegetable oil. Beef fat (tallow). Lard.  Sweets and desserts: Cookies. Cakes. Pies. Candy.  Seasoning and other foods: Mayonnaise. Premade sauces and marinades. The items listed may not be a complete list. Talk with your dietitian about what dietary choices are right for you. Summary  The Mediterranean diet includes both food and lifestyle choices.  Eat a variety of fresh fruits and vegetables, beans, nuts, seeds, and whole grains.  Limit the amount of red meat and sweets that you eat.  Talk with your health care provider about whether it is safe for you to drink red wine in moderation. This means 1 glass a day for nonpregnant women and 2 glasses a day for men. A glass of wine equals 5 oz (150 mL). This information is not intended to replace advice given to you by your health care provider. Make sure you discuss any questions you have with your health care provider. Document Revised: 10/13/2015 Document Reviewed: 10/06/2015   Insomnia Insomnia is a sleep disorder that makes it difficult to fall asleep or stay asleep. Insomnia can cause fatigue, low energy, difficulty concentrating, mood swings, and poor  performance at work or school. There are three different ways to classify insomnia:  Difficulty falling asleep.  Difficulty staying asleep.  Waking up too early in the morning. Any type of insomnia can be long-term (chronic) or short-term (acute). Both are common. Short-term insomnia usually lasts for three months or less. Chronic insomnia occurs at least three times a week for longer than three months. What are the causes? Insomnia may be caused by another condition, situation, or substance, such as:  Anxiety.  Certain medicines.  Gastroesophageal reflux disease (GERD) or other gastrointestinal conditions.  Asthma or other breathing conditions.  Restless legs syndrome, sleep apnea, or  other sleep disorders.  Chronic pain.  Menopause.  Stroke.  Abuse of alcohol, tobacco, or illegal drugs.  Mental health conditions, such as depression.  Caffeine.  Neurological disorders, such as Alzheimer's disease.  An overactive thyroid (hyperthyroidism). Sometimes, the cause of insomnia may not be known. What increases the risk? Risk factors for insomnia include:  Gender. Women are affected more often than men.  Age. Insomnia is more common as you get older.  Stress.  Lack of exercise.  Irregular work schedule or working night shifts.  Traveling between different time zones.  Certain medical and mental health conditions. What are the signs or symptoms? If you have insomnia, the main symptom is having trouble falling asleep or having trouble staying asleep. This may lead to other symptoms, such as:  Feeling fatigued or having low energy.  Feeling nervous about going to sleep.  Not feeling rested in the morning.  Having trouble concentrating.  Feeling irritable, anxious, or depressed. How is this diagnosed? This condition may be diagnosed based on:  Your symptoms and medical history. Your health care provider may ask about: ? Your sleep habits. ? Any medical conditions you have. ? Your mental health.  A physical exam. How is this treated? Treatment for insomnia depends on the cause. Treatment may focus on treating an underlying condition that is causing insomnia. Treatment may also include:  Medicines to help you sleep.  Counseling or therapy.  Lifestyle adjustments to help you sleep better. Follow these instructions at home: Eating and drinking   Limit or avoid alcohol, caffeinated beverages, and cigarettes, especially close to bedtime. These can disrupt your sleep.  Do not eat a large meal or eat spicy foods right before bedtime. This can lead to digestive discomfort that can make it hard for you to sleep. Sleep habits   Keep a sleep diary  to help you and your health care provider figure out what could be causing your insomnia. Write down: ? When you sleep. ? When you wake up during the night. ? How well you sleep. ? How rested you feel the next day. ? Any side effects of medicines you are taking. ? What you eat and drink.  Make your bedroom a dark, comfortable place where it is easy to fall asleep. ? Put up shades or blackout curtains to block light from outside. ? Use a white noise machine to block noise. ? Keep the temperature cool.  Limit screen use before bedtime. This includes: ? Watching TV. ? Using your smartphone, tablet, or computer.  Stick to a routine that includes going to bed and waking up at the same times every day and night. This can help you fall asleep faster. Consider making a quiet activity, such as reading, part of your nighttime routine.  Try to avoid taking naps during the day so that you sleep better at  night.  Get out of bed if you are still awake after 15 minutes of trying to sleep. Keep the lights down, but try reading or doing a quiet activity. When you feel sleepy, go back to bed. General instructions  Take over-the-counter and prescription medicines only as told by your health care provider.  Exercise regularly, as told by your health care provider. Avoid exercise starting several hours before bedtime.  Use relaxation techniques to manage stress. Ask your health care provider to suggest some techniques that may work well for you. These may include: ? Breathing exercises. ? Routines to release muscle tension. ? Visualizing peaceful scenes.  Make sure that you drive carefully. Avoid driving if you feel very sleepy.  Keep all follow-up visits as told by your health care provider. This is important. Contact a health care provider if:  You are tired throughout the day.  You have trouble in your daily routine due to sleepiness.  You continue to have sleep problems, or your sleep  problems get worse. Get help right away if:  You have serious thoughts about hurting yourself or someone else. If you ever feel like you may hurt yourself or others, or have thoughts about taking your own life, get help right away. You can go to your nearest emergency department or call:  Your local emergency services (911 in the [place]  A suicide crisis helpline, such as the National Suicide Prevention Lifeline at 2812465517. This is open 24 hours a day. Summary  Insomnia is a sleep disorder that makes it difficult to fall asleep or stay asleep.  Insomnia can be long-term (chronic) or short-term (acute).  Treatment for insomnia depends on the cause. Treatment may focus on treating an underlying condition that is causing insomnia.  Keep a sleep diary to help you and your health care provider figure out what could be causing your insomnia. This information is not intended to replace advice given to you by your health care provider. Make sure you discuss any questions you have with your health care provider. Document Revised: 01/25/2017 Document Reviewed: 11/22/2016 Elsevier Patient Education  2020 [employer].   Elsevier Patient Education  [employer].

## 2019-05-22 LAB — URINE CYTOLOGY ANCILLARY ONLY
Chlamydia: NEGATIVE
Comment: NEGATIVE
Comment: NEGATIVE
Comment: NORMAL
Neisseria Gonorrhea: NEGATIVE
Trichomonas: NEGATIVE

## 2019-06-15 ENCOUNTER — Ambulatory Visit: Payer: Self-pay | Admitting: Nurse Practitioner

## 2019-07-06 ENCOUNTER — Ambulatory Visit (INDEPENDENT_AMBULATORY_CARE_PROVIDER_SITE_OTHER): Payer: 59 | Admitting: Otolaryngology

## 2019-07-10 ENCOUNTER — Ambulatory Visit: Payer: Self-pay | Admitting: Family Medicine

## 2019-07-29 ENCOUNTER — Ambulatory Visit (HOSPITAL_COMMUNITY)
Admission: EM | Admit: 2019-07-29 | Discharge: 2019-07-29 | Disposition: A | Discharge status: Home / Self Care | Payer: 59 | Attending: Emergency Medicine | Admitting: Emergency Medicine

## 2019-07-29 ENCOUNTER — Encounter (HOSPITAL_COMMUNITY): Payer: Self-pay

## 2019-07-29 ENCOUNTER — Other Ambulatory Visit: Payer: Self-pay

## 2019-07-29 DIAGNOSIS — Z20822 Contact with and (suspected) exposure to covid-19: Secondary | ICD-10-CM | POA: Diagnosis not present

## 2019-07-29 DIAGNOSIS — J45909 Unspecified asthma, uncomplicated: Secondary | ICD-10-CM | POA: Diagnosis not present

## 2019-07-29 DIAGNOSIS — F419 Anxiety disorder, unspecified: Secondary | ICD-10-CM | POA: Diagnosis not present

## 2019-07-29 DIAGNOSIS — H8110 Benign paroxysmal vertigo, unspecified ear: Secondary | ICD-10-CM | POA: Insufficient documentation

## 2019-07-29 DIAGNOSIS — Z8249 Family history of ischemic heart disease and other diseases of the circulatory system: Secondary | ICD-10-CM | POA: Diagnosis not present

## 2019-07-29 DIAGNOSIS — Z113 Encounter for screening for infections with a predominantly sexual mode of transmission: Secondary | ICD-10-CM | POA: Insufficient documentation

## 2019-07-29 DIAGNOSIS — J019 Acute sinusitis, unspecified: Secondary | ICD-10-CM | POA: Insufficient documentation

## 2019-07-29 DIAGNOSIS — K219 Gastro-esophageal reflux disease without esophagitis: Secondary | ICD-10-CM | POA: Insufficient documentation

## 2019-07-29 DIAGNOSIS — Z79899 Other long term (current) drug therapy: Secondary | ICD-10-CM | POA: Diagnosis not present

## 2019-07-29 DIAGNOSIS — Z87891 Personal history of nicotine dependence: Secondary | ICD-10-CM | POA: Diagnosis not present

## 2019-07-29 DIAGNOSIS — E559 Vitamin D deficiency, unspecified: Secondary | ICD-10-CM | POA: Insufficient documentation

## 2019-07-29 DIAGNOSIS — I10 Essential (primary) hypertension: Secondary | ICD-10-CM | POA: Diagnosis not present

## 2019-07-29 HISTORY — DX: Other seasonal allergic rhinitis: J30.2

## 2019-07-29 HISTORY — DX: Vitamin D deficiency, unspecified: E55.9

## 2019-07-29 MED ORDER — FLUTICASONE PROPIONATE 50 MCG/ACT NA SUSP: 1.0000 | Freq: Every day | NASAL | 0 refills | Status: DC

## 2019-07-29 MED ORDER — AMOXICILLIN-POT CLAVULANATE 875-125 MG PO TABS: 1.0000 | ORAL_TABLET | Freq: Two times a day (BID) | ORAL | 0 refills | Status: AC

## 2019-07-29 NOTE — ED Triage Notes (Addendum)
Pc/o non productive cough, runny nose, HA, head congestion, ears popping, dizziness, SOBx2 wks. Pt has non labored breathing. Skin color WNL. Lungs clear. Pt also wants STi testing

## 2019-07-29 NOTE — Discharge Instructions (Signed)
Begin Augmentin twice daily for the next week, take with food Begin Flonase nasal spray 1-2 spray in each nostril daily to help with sinus congestion, inflammation and ear pressure May continue with DayQuil/NyQuil, Mucinex, Zyrtec/Claritin or other over-the-counter congestion medicine Rest and drink plenty of fluids  We are testing you for Gonorrhea, Chlamydia and Trichomonas. We will call you if anything is positive and let you know if you require any further treatment. Please inform partner of any positive results.  Please return if symptoms not improving with treatment, development of fever, nausea, vomiting, abdominal pain, scrotal pain, shortness of breath, difficulty breathing

## 2019-07-29 NOTE — ED Provider Notes (Signed)
Walter Howell    CSN: [id number] Arrival date & time: 07/29/19  0803      History   Chief Complaint Chief Complaint  Patient presents with  . Cough    HPI Walter Howell is a 34 y.o. male history of hypertension, GERD, presenting today for evaluation of URI symptoms and STD screening.  Patient reports over the past 2 weeks he has had a lot of nasal congestion, sinus pressure, ear pressure and popping, occasional cough.  He has felt fatigued and tired related to this.  Denies any known fevers.  Denies any close sick contacts.  Has been using some natural over-the-counter remedies along with DayQuil/NyQuil without relief.  Also would like to be screened for STDs.  Denies any known exposures or any symptoms.  Denies penile discharge or dysuria.  HPI  Past Medical History:  Diagnosis Date  . Anxiety   . Asthma   . GERD (gastroesophageal reflux disease)   . Hypertension   . Seasonal allergies   . Vitamin D deficiency     Patient Active Problem List   Diagnosis Date Noted  . Benign paroxysmal positional vertigo 07/23/2017  . TMJ (temporomandibular joint disorder) 07/23/2017  . Gastroesophageal reflux disease without esophagitis 07/23/2017  . History of herniated intervertebral disc 12/25/2016  . Palpitations 12/25/2016  . Anxiety and depression 12/06/2015  . Chronic bilateral low back pain without sciatica 12/06/2015  . Essential hypertension 07/08/2013    History reviewed. No pertinent surgical history.     Home Medications    Prior to Admission medications   Medication Sig Start Date End Date Taking? Authorizing Provider  amLODipine (NORVASC) 5 MG tablet Take 5 mg by mouth daily.   Yes [provider]  cetirizine (ZYRTEC) 10 MG tablet Take 10 mg by mouth daily.   Yes [provider]  acetaminophen (TYLENOL) 500 MG tablet Take 1,000 mg by mouth every 6 (six) hours as needed for mild pain, moderate pain, fever or headache.    [provider]  amLODipine (NORVASC) 5 MG tablet Take 1 tablet (5 mg total) by mouth daily. Patient not taking: Reported on 05/21/2019 02/12/19   Argentina Donovan, PA-C  amoxicillin-clavulanate (AUGMENTIN) 875-125 MG tablet Take 1 tablet by mouth every 12 (twelve) hours for 7 days. 07/29/19 08/05/19  Mak Bonny C, PA-C  busPIRone (BUSPAR) 15 MG tablet Take 1 tablet (15 mg total) by mouth 2 (two) times daily. 02/12/19   Argentina Donovan, PA-C  chlorthalidone (HYGROTON) 25 MG tablet Take 1 tablet (25 mg total) by mouth daily. Must have office visit for refills 05/04/19   Ladell Pier, MD  DULoxetine (CYMBALTA) 20 MG capsule Take 2 capsules (40 mg total) by mouth daily. Patient not taking: Reported on 02/12/2019 03/28/17   Alfonse Spruce, FNP  fluticasone (FLONASE) 50 MCG/ACT nasal spray Place 1-2 sprays into both nostrils daily for 7 days. 07/29/19 08/05/19  Binyomin Brann C, PA-C  hydrOXYzine (ATARAX/VISTARIL) 25 MG tablet Take 1 tablet (25 mg total) by mouth every 8 (eight) hours as needed. For anxiety 02/12/19   Argentina Donovan, PA-C  meclizine (ANTIVERT) 12.5 MG tablet Take 1 tablet (12.5 mg total) by mouth 3 (three) times daily as needed for dizziness. 05/21/19   Mayers, Cari S, PA-C  Multiple Vitamin (MULTIVITAMIN WITH MINERALS) TABS tablet Take 1 tablet by mouth daily.    [provider]  omeprazole (PRILOSEC) 20 MG capsule Take 1 capsule (20 mg total) by mouth daily. 02/12/19  Georgian Co M, PA-C  Vitamin D, Ergocalciferol, (DRISDOL) 1.25 MG (50000 UT) CAPS capsule Take 1 capsule (50,000 Units total) by mouth every 7 (seven) days. 02/17/19   Anders Simmonds, PA-C    Family History Family History  Problem Relation Age of Onset  . Diabetes Maternal Grandmother   . Hypertension Maternal Grandmother   . Diabetes Maternal Grandfather   . Diabetes Paternal Grandmother   . Heart disease Paternal Grandmother   . Diabetes Paternal Grandfather     Social  History Social History   Tobacco Use  . Smoking status: Former Games developer  . Smokeless tobacco: Never Used  Substance Use Topics  . Alcohol use: No  . Drug use: No     Allergies   Prednisone   Review of Systems Review of Systems  Constitutional: Positive for fatigue. Negative for activity change, appetite change, chills and fever.  HENT: Positive for congestion, ear pain, rhinorrhea and sinus pressure. Negative for sore throat and trouble swallowing.   Eyes: Negative for discharge and redness.  Respiratory: Positive for cough. Negative for chest tightness and shortness of breath.   Cardiovascular: Negative for chest pain.  Gastrointestinal: Negative for abdominal pain, diarrhea, nausea and vomiting.  Genitourinary: Negative for discharge and dysuria.  Musculoskeletal: Negative for myalgias.  Skin: Negative for rash.  Neurological: Negative for dizziness, light-headedness and headaches.     Physical Exam Triage Vital Signs ED Triage Vitals  Enc Vitals Group     BP 07/29/19 0832 (!) 134/93     Pulse Rate 07/29/19 0832 71     Resp 07/29/19 0832 16     Temp 07/29/19 0832 98.4 F (36.9 C)     Temp Source 07/29/19 0832 Oral     SpO2 07/29/19 0832 98 %     Weight 07/29/19 0833 263 lb (119.3 kg)     Height 07/29/19 0833 5\' 11"  (1.803 m)     Head Circumference --      Peak Flow --      Pain Score 07/29/19 0832 8     Pain Loc --      Pain Edu? --      Excl. in GC? --    No data found.  Updated Vital Signs BP (!) 134/93   Pulse 71   Temp 98.4 F (36.9 C) (Oral)   Resp 16   Ht 5\' 11"  (1.803 m)   Wt 263 lb (119.3 kg)   SpO2 98%   BMI 36.68 kg/m   Visual Acuity Right Eye Distance:   Left Eye Distance:   Bilateral Distance:    Right Eye Near:   Left Eye Near:    Bilateral Near:     Physical Exam Vitals and nursing note reviewed.  Constitutional:      Appearance: He is well-developed.     Comments: No acute distress  HENT:     Head: Normocephalic and  atraumatic.     Ears:     Comments: Bilateral ears without tenderness to palpation of external auricle, tragus and mastoid, EAC's without erythema or swelling, TM's with good bony landmarks and cone of light. Non erythematous.    Nose: Nose normal.     Comments: Nasal mucosa erythematous with swollen turbinates bilaterally    Mouth/Throat:     Comments: Oral mucosa pink and moist, no tonsillar enlargement or exudate. Posterior pharynx patent and nonerythematous, no uvula deviation or swelling. Normal phonation. Eyes:     Conjunctiva/sclera: Conjunctivae normal.  Cardiovascular:  Rate and Rhythm: Normal rate.  Pulmonary:     Effort: Pulmonary effort is normal. No respiratory distress.     Comments: Breathing comfortably at rest, CTABL, no wheezing, rales or other adventitious sounds auscultated Abdominal:     General: There is no distension.  Musculoskeletal:        General: Normal range of motion.     Cervical back: Neck supple.  Skin:    General: Skin is warm and dry.  Neurological:     Mental Status: He is alert and oriented to person, place, and time.      UC Treatments / Results  Labs (all labs ordered are listed, but only abnormal results are displayed) Labs Reviewed  SARS CORONAVIRUS 2 (TAT 6-24 HRS)  CYTOLOGY, (ORAL, ANAL, URETHRAL) ANCILLARY ONLY    EKG   Radiology No results found.  Procedures Procedures (including critical care time)  Medications Ordered in UC Medications - No data to display  Initial Impression / Assessment and Plan / UC Course  I have reviewed the triage vital signs and the nursing notes.  Pertinent labs & imaging results that were available during my care of the patient were reviewed by me and considered in my medical decision making (see chart for details).     2 weeks of URI symptoms, treating for sinusitis, lungs clear, vital signs stable.  Initiating on Augmentin.  Flonase, may continue other symptomatic and supportive  care.  STD screening with urethral swab for gonorrhea, chlamydia and trichomonas.  Deferring HIV and RPR today based off patient preference.  Discussed strict return precautions. Patient verbalized understanding and is agreeable with plan.  Final Clinical Impressions(s) / UC Diagnoses   Final diagnoses:  Acute sinusitis with symptoms > 10 days  Screen for STD (sexually transmitted disease)     Discharge Instructions     Begin Augmentin twice daily for the next week, take with food Begin Flonase nasal spray 1-2 spray in each nostril daily to help with sinus congestion, inflammation and ear pressure May continue with DayQuil/NyQuil, Mucinex, Zyrtec/Claritin or other over-the-counter congestion medicine Rest and drink plenty of fluids  We are testing you for Gonorrhea, Chlamydia and Trichomonas. We will call you if anything is positive and let you know if you require any further treatment. Please inform partner of any positive results.  Please return if symptoms not improving with treatment, development of fever, nausea, vomiting, abdominal pain, scrotal pain, shortness of breath, difficulty breathing    ED Prescriptions    Medication Sig Dispense Auth. Provider   amoxicillin-clavulanate (AUGMENTIN) 875-125 MG tablet Take 1 tablet by mouth every 12 (twelve) hours for 7 days. 14 tablet Reinhold Rickey C, PA-C   fluticasone (FLONASE) 50 MCG/ACT nasal spray Place 1-2 sprays into both nostrils daily for 7 days. 1 g Tanda Morrissey, [place] C, PA-C     PDMP not reviewed this encounter.   Lew Dawes, [place] 07/29/19 [phone]

## 2019-07-30 LAB — CYTOLOGY, (ORAL, ANAL, URETHRAL) ANCILLARY ONLY
Chlamydia: NEGATIVE
Comment: NEGATIVE
Comment: NEGATIVE
Comment: NORMAL
Neisseria Gonorrhea: NEGATIVE
Trichomonas: NEGATIVE

## 2019-07-30 LAB — SARS CORONAVIRUS 2 (TAT 6-24 HRS): SARS Coronavirus 2: NEGATIVE

## 2019-08-10 ENCOUNTER — Other Ambulatory Visit: Payer: Self-pay | Admitting: Physician Assistant

## 2019-08-10 DIAGNOSIS — I10 Essential (primary) hypertension: Secondary | ICD-10-CM

## 2019-09-04 ENCOUNTER — Encounter: Payer: Self-pay | Admitting: Internal Medicine

## 2019-09-04 ENCOUNTER — Ambulatory Visit: Payer: 59 | Attending: Internal Medicine | Admitting: Internal Medicine

## 2019-09-04 ENCOUNTER — Other Ambulatory Visit: Payer: Self-pay

## 2019-09-04 VITALS — BP 151/93 | HR 78 | Temp 99.3°F | Resp 16 | Ht 71.0 in | Wt 262.4 lb

## 2019-09-04 DIAGNOSIS — I10 Essential (primary) hypertension: Secondary | ICD-10-CM | POA: Diagnosis not present

## 2019-09-04 DIAGNOSIS — J4520 Mild intermittent asthma, uncomplicated: Secondary | ICD-10-CM

## 2019-09-04 DIAGNOSIS — Z113 Encounter for screening for infections with a predominantly sexual mode of transmission: Secondary | ICD-10-CM

## 2019-09-04 DIAGNOSIS — E669 Obesity, unspecified: Secondary | ICD-10-CM | POA: Diagnosis not present

## 2019-09-04 DIAGNOSIS — H8110 Benign paroxysmal vertigo, unspecified ear: Secondary | ICD-10-CM | POA: Diagnosis not present

## 2019-09-04 MED ORDER — ALBUTEROL SULFATE HFA 108 (90 BASE) MCG/ACT IN AERS: 2.0000 | INHALATION_SPRAY | Freq: Four times a day (QID) | RESPIRATORY_TRACT | 3 refills | Status: DC | PRN

## 2019-09-04 MED ORDER — AMLODIPINE BESYLATE 10 MG PO TABS: 10.0000 mg | ORAL_TABLET | Freq: Every day | ORAL | 6 refills | Status: DC

## 2019-09-04 MED ORDER — CHLORTHALIDONE 25 MG PO TABS: 25.0000 mg | ORAL_TABLET | Freq: Every day | ORAL | 6 refills | Status: DC

## 2019-09-04 NOTE — Progress Notes (Signed)
Patient ID: Walter Howell, male    DOB: [birth year]  MRN: [id number]  CC: Hypertension   Subjective: Walter Howell is a 34 y.o. male who presents for chronic ds management His concerns today include:  Patient with history of HTN,anx/dep,chronic lower back pain.  HM: completed COVID vacc Pfizer  HYPERTENSION Currently taking: see medication list Med Adherence: [x]  Yes    []  No Medication side effects: []  Yes    [x]  No Adherence with salt restriction: [x]  Yes    []  No Home Monitoring?: [x]  Yes    []  No Monitoring Frequency: daily Home BP results range: 140s/90s SOB? []  Yes    [x]  No Chest Pain?: []  Yes    [x]  No Leg swelling?: []  Yes    [x]  No Headaches?: []  Yes    []  No Dizziness? [x]  Yes    []  No Comments:    Reports history of asthma in childhood and a teenager.  He has not been bothered with it for several years but started bothering him some especially with the hot weather.  He is requesting an albuterol inhaler.    He is requesting STD screening including HIV.  Currently asymptomatic.  He is still bothered with chronic paroxysmal positional vertigo.  He states sometimes that just looking down at his cell phone causes dizziness for several seconds.  Rolling over in bed causes dizziness.  Meclizine does not help.  We had plan to refer him to physical therapy for vestibular training but he had lacked insurance.  He now has insurance and would like to move forward with the referral.  Weight is fairly stable.  He reports he has been doing a lot better with his eating habits. Patient Active Problem List   Diagnosis Date Noted  . Benign paroxysmal positional vertigo 07/23/2017  . TMJ (temporomandibular joint disorder) 07/23/2017  . Gastroesophageal reflux disease without esophagitis 07/23/2017  . History of herniated intervertebral disc 12/25/2016  . Palpitations 12/25/2016  . Anxiety and depression 12/06/2015  . Chronic bilateral low back pain without sciatica  12/06/2015  . Essential hypertension 07/08/2013     Current Outpatient Medications on File Prior to Visit  Medication Sig Dispense Refill  . acetaminophen (TYLENOL) 500 MG tablet Take 1,000 mg by mouth every 6 (six) hours as needed for mild pain, moderate pain, fever or headache.    amLODipine (NORVASC) 5 MG tablet Take 1 tablet (5 mg total) by mouth daily. (Patient not taking: Reported on 05/21/2019) 30 tablet 6  . amLODipine (NORVASC) 5 MG tablet Take 5 mg by mouth daily.    . busPIRone (BUSPAR) 15 MG tablet Take 1 tablet (15 mg total) by mouth 2 (two) times daily. 60 tablet 3  . cetirizine (ZYRTEC) 10 MG tablet Take 10 mg by mouth daily.    . chlorthalidone (HYGROTON) 25 MG tablet TAKE 1 TABLET BY MOUTH EVERY DAY 30 tablet 2  . DULoxetine (CYMBALTA) 20 MG capsule Take 2 capsules (40 mg total) by mouth daily. (Patient not taking: Reported on 02/12/2019) 60 capsule 2  . fluticasone (FLONASE) 50 MCG/ACT nasal spray Place 1-2 sprays into both nostrils daily for 7 days. 1 g 0  . hydrOXYzine (ATARAX/VISTARIL) 25 MG tablet Take 1 tablet (25 mg total) by mouth every 8 (eight) hours as needed. For anxiety 30 tablet 3  . meclizine (ANTIVERT) 12.5 MG tablet Take 1 tablet (12.5 mg total) by mouth 3 (three) times daily as needed for dizziness. 30 tablet 0  . Multiple Vitamin (  MULTIVITAMIN WITH MINERALS) TABS tablet Take 1 tablet by mouth daily.    Marland Kitchen omeprazole (PRILOSEC) 20 MG capsule Take 1 capsule (20 mg total) by mouth daily. 30 capsule 3  . Vitamin D, Ergocalciferol, (DRISDOL) 1.25 MG (50000 UT) CAPS capsule Take 1 capsule (50,000 Units total) by mouth every 7 (seven) days. 16 capsule 0   No current facility-administered medications on file prior to visit.    Allergies  Allergen Reactions  . Prednisone     "makes him feel weird"    Social History   Socioeconomic History  . Marital status: Significant Other    Spouse name: Not on file  . Number of children: Not on file  . Years of  education: Not on file  . Highest education level: Not on file  Occupational History  . Not on file  Tobacco Use  . Smoking status: Former Games developer  . Smokeless tobacco: Never Used  Vaping Use  . Vaping Use: Never used  Substance and Sexual Activity  . Alcohol use: No  . Drug use: No  . Sexual activity: Not on file  Other Topics Concern  . Not on file  Social History Narrative  . Not on file   Social Determinants of Health   Financial Resource Strain:   . Difficulty of Paying Living Expenses:   Food Insecurity:   . Worried About Programme researcher, broadcasting/film/video in the Last Year:   . Barista in the Last Year:   Transportation Needs:   . Freight forwarder (Medical):   Marland Kitchen Lack of Transportation (Non-Medical):   Physical Activity:   . Days of Exercise per Week:   . Minutes of Exercise per Session:   Stress:   . Feeling of Stress :   Social Connections:   . Frequency of Communication with Friends and Family:   . Frequency of Social Gatherings with Friends and Family:   . Attends Religious Services:   . Active Member of Clubs or Organizations:   . Attends Banker Meetings:   Marland Kitchen Marital Status:   Intimate Partner Violence:   . Fear of Current or Ex-Partner:   . Emotionally Abused:   Marland Kitchen Physically Abused:   . Sexually Abused:     Family History  Problem Relation Age of Onset  . Diabetes Maternal Grandmother   . Hypertension Maternal Grandmother   . Diabetes Maternal Grandfather   . Diabetes Paternal Grandmother   . Heart disease Paternal Grandmother   . Diabetes Paternal Grandfather     No past surgical history on file.  ROS: Review of Systems Negative except as stated above  PHYSICAL EXAM: BP (!) 151/93   Pulse 78   Temp 99.3 F (37.4 C)   Resp 16   Ht 5\' 11"  (1.803 m)   Wt 262 lb 6.4 oz (119 kg)   SpO2 96%   BMI 36.60 kg/m   Wt Readings from Last 3 Encounters:  09/04/19 262 lb 6.4 oz (119 kg)  07/29/19 263 lb (119.3 kg)  05/21/19 263 lb  (119.3 kg)    Physical Exam Blood pressure sitting 145/95, blood pressure standing 148/100 General appearance - alert, well appearing, young African-American male and in no distress Mental status - normal mood, behavior, speech, dress, motor activity, and thought processes Neck - supple, no significant adenopathy Chest - clear to auscultation, no wheezes, rales or rhonchi, symmetric air entry Heart - normal rate, regular rhythm, normal S1, S2, no murmurs, rubs, clicks or gallops  Neurological - cranial nerves II through XII intact, motor and sensory grossly normal bilaterally, Romberg sign negative, normal gait and station Extremities - peripheral pulses normal, no pedal edema, no clubbing or cyanosis  CMP Latest Ref Rng & Units 02/12/2019 01/18/2017 12/25/2016  Glucose 65 - 99 mg/dL 91 960(A) 97  BUN 6 - 20 mg/dL 13 20 14   Creatinine 0.76 - 1.27 mg/dL 5.40 9.81  Sodium 134 - 144 mmol/L 141 140 139  Potassium 3.5 - 5.2 mmol/L 4.0 4.0 4.0  Chloride 96 - 106 mmol/L 100 105 98  CO2 20 - 29 mmol/L 25 26 27   Calcium 8.7 - 10.2 mg/dL 9.5 9.8 10.4(H)  Total Protein 6.0 - 8.5 g/dL 6.8 7.6 -  Total Bilirubin 0.0 - 1.2 mg/dL 0.4 0.8 -  Alkaline Phos 39 - 117 IU/L 46 46 -  AST 0 - 40 IU/L 24 30 -  ALT 0 - 44 IU/L 41 45 -   Lipid Panel  No results found for: CHOL, TRIG, HDL, CHOLHDL, VLDL, LDLCALC, LDLDIRECT  CBC    Component Value Date/Time   WBC 4.9 02/12/2019 1402   WBC 6.3 01/18/2017 0824   RBC 5.37 02/12/2019 1402   RBC 4.90 01/18/2017 0824   HGB 16.9 02/12/2019 1402   HCT 49.5 02/12/2019 1402   PLT 243 02/12/2019 1402   MCV 92 02/12/2019 1402   MCH 31.5 02/12/2019 1402   MCH 32.4 01/18/2017 0824   MCHC 34.1 02/12/2019 1402   MCHC 35.7 01/18/2017 0824   RDW 14.4 02/12/2019 1402   LYMPHSABS 1.7 02/12/2019 1402   MONOABS 0.5 10/10/2016 2313   EOSABS 0.1 02/12/2019 1402   BASOSABS 0.0 02/12/2019 1402    ASSESSMENT AND PLAN: 1. Essential hypertension Not at goal.   Increase amlodipine to 10 mg.  Continue current dose of chlorthalidone. - amLODipine (NORVASC) 10 MG tablet; Take 1 tablet (10 mg total) by mouth daily.  Dispense: 30 tablet; Refill: 6 - chlorthalidone (HYGROTON) 25 MG tablet; Take 1 tablet (25 mg total) by mouth daily.  Dispense: 30 tablet; Refill: 6  2. Obesity (BMI 35.0-39.9 without comorbidity) Discussed and encourage healthy eating habits and regular exercise  3. Benign paroxysmal positional vertigo, unspecified laterality Will refer to physical therapy for vestibular training  4. Screen for STD (sexually transmitted disease) - HIV antibody (with reflex); Future - Urine cytology ancillary only; Future  5. Mild intermittent asthma without complication Albuterol prescription sent to his pharmacy.   Patient was given the opportunity to ask questions.  Patient verbalized understanding of the plan and was able to repeat key elements of the plan.   No orders of the defined types were placed in this encounter.    Requested Prescriptions    No prescriptions requested or ordered in this encounter    No follow-ups on file.  02/14/2019, MD, FACP

## 2019-09-04 NOTE — Patient Instructions (Signed)
Increase Amlodipine to 10mg daily.

## 2019-09-07 ENCOUNTER — Other Ambulatory Visit (HOSPITAL_COMMUNITY)
Admission: RE | Admit: 2019-09-07 | Discharge: 2019-09-07 | Disposition: A | Discharge status: Home / Self Care | Payer: 59 | Source: Ambulatory Visit | Attending: Internal Medicine | Admitting: Internal Medicine

## 2019-09-07 DIAGNOSIS — Z113 Encounter for screening for infections with a predominantly sexual mode of transmission: Secondary | ICD-10-CM | POA: Diagnosis present

## 2019-09-07 NOTE — Addendum Note (Signed)
Addended byMemory Dance on: 09/07/2019 04:32 PM   Modules accepted: Orders

## 2019-09-08 LAB — HIV ANTIBODY (ROUTINE TESTING W REFLEX): HIV Screen 4th Generation wRfx: NONREACTIVE

## 2019-09-14 ENCOUNTER — Telehealth: Payer: Self-pay | Admitting: Internal Medicine

## 2019-09-14 LAB — URINE CYTOLOGY ANCILLARY ONLY
Chlamydia: NEGATIVE
Comment: NEGATIVE
Comment: NEGATIVE
Comment: NORMAL
Neisseria Gonorrhea: NEGATIVE
Trichomonas: NEGATIVE

## 2019-09-14 NOTE — Telephone Encounter (Signed)
Lvm for patient to call me back

## 2019-09-14 NOTE — Telephone Encounter (Signed)
Patient called to get more information on the referral that was placed by Dr. Laural Benes.  He stated he has some questions and he has not heard from anyone yet.  Please call patient to discuss at [phone]

## 2019-09-17 ENCOUNTER — Other Ambulatory Visit: Payer: Self-pay

## 2019-09-17 ENCOUNTER — Ambulatory Visit (INDEPENDENT_AMBULATORY_CARE_PROVIDER_SITE_OTHER): Payer: 59 | Admitting: Rehabilitative and Restorative Service Providers"

## 2019-09-17 DIAGNOSIS — H8111 Benign paroxysmal vertigo, right ear: Secondary | ICD-10-CM | POA: Diagnosis not present

## 2019-09-17 DIAGNOSIS — R42 Dizziness and giddiness: Secondary | ICD-10-CM | POA: Diagnosis not present

## 2019-09-17 NOTE — Patient Instructions (Signed)
Access Code: [email or web address] URL: [email or web address] Date: 09/17/2019 Prepared by: Margretta Ditty  Program Notes Symptoms should stay at 4/10 or less.    Exercises Standing Gaze Stabilization with Head Rotation - 2 x daily - 7 x weekly - 1 sets - 10 reps

## 2019-09-17 NOTE — Therapy (Signed)
[facility] [place] [address] [place], [place], [address] Phone: [phone]   Fax:  [phone]  Physical Therapy Evaluation  Patient Details  Name: Walter Howell MRN: [id number] Date of Birth: Nov 11, 1985 Referring Provider (PT): Jonah Blue, MD   Encounter Date: 09/17/2019   PT End of Session - 09/17/19 1425    Visit Number 1    Number of Visits 6    Date for PT Re-Evaluation 10/29/19    Authorization Type [employer]    PT Start Time 1405    PT Stop Time 1448    PT Time Calculation (min) 43 min           Past Medical History:  Diagnosis Date  . Anxiety   . Asthma   . GERD (gastroesophageal reflux disease)   . Hypertension   . Seasonal allergies   . Vitamin D deficiency     No past surgical history on file.  There were no vitals filed for this visit.    Subjective Assessment - 09/17/19 1408    Subjective The patient reports > 2 year history of vertigo.  He notes spinning when he looks up and down.  He gets occasional dizziness with standing, position changes.  He gets some nausea at times with vertigo.  Symptoms last for 1-2 minutes, accompanied by unsteadiness.  He also reports vertigo symptoms can trigger anxiety.    Patient Stated Goals reduce vertigo    Currently in Pain? No/denies    Pain Radiating Towards *he notes occasional pain in jaw muscles and back of his neck              [facility] PT Assessment - 09/17/19 1416      Assessment   Medical Diagnosis BPPV    Referring Provider (PT) Jonah Blue, MD    Onset Date/Surgical Date 09/04/19    Hand Dominance Right    Prior Therapy none for vertigo      Precautions   Precautions None      Restrictions   Weight Bearing Restrictions No      Balance Screen   Has the patient fallen in the past 6 months No    Has the patient had a decrease in activity level because of a fear of falling?  No    Is the patient reluctant to leave their  home because of a fear of falling?  No      Home Tourist information centre manager residence      Prior Function   Level of Independence Independent    Vocation Part time employment    Vocation Requirements on his feet      Observation/Other Assessments   Focus on Therapeutic Outcomes (FOTO)  51% limitation                  Vestibular Assessment - 09/17/19 1418      Vestibular Assessment   General Observation walks into clinic independently withut a device      Symptom Behavior   Subjective history of current problem began 2 years ago insidious onset    Type of Dizziness  Spinning;Imbalance    Frequency of Dizziness daily    Duration of Dizziness minutes    Symptom Nature Motion provoked;Positional    Aggravating Factors Activity in general;Looking up to the ceiling;Lying supine;Mornings;Turning head quickly   scrolling on a phone   Relieving Factors Head stationary    Progression of Symptoms Worse    History  of similar episodes none prior to 2 years ago      Oculomotor Exam   Oculomotor Alignment Normal    Ocular ROM normal    Spontaneous Absent    Gaze-induced  Absent    Smooth Pursuits Intact    Saccades Intact      Vestibulo-Ocular Reflex   VOR 1 Head Only (x 1 viewing) VOR at self regulated pace x 10 reps provokes 7/10 dizziness    VOR Cancellation Unable to maintain gaze   rates symptoms 6/10   Comment head impulse test=positive to the right side for refixation saccade      Positional Testing   Dix-Hallpike Dix-Hallpike Right;Dix-Hallpike Left    Sidelying Test Sidelying Right;Sidelying Left    Horizontal Canal Testing Horizontal Canal Right;Horizontal Canal Left      Dix-Hallpike Right   Dix-Hallpike Right Duration subjective symptoms of spinning and lightheadedness rated 9/10; no nystagmus viewed in room light/ lasted x 30 second duration    Dix-Hallpike Right Symptoms No nystagmus      Dix-Hallpike Left   Dix-Hallpike Left Duration 10 sec  latency and then reports 10 seconds duration of mild dizziness    Dix-Hallpike Left Symptoms No nystagmus      Sidelying Right   Sidelying Right Duration 3/10 (performed after epley's to assess for brandt daroff)    Sidelying Right Symptoms No nystagmus      Sidelying Left   Sidelying Left Duration none    Sidelying Left Symptoms No nystagmus      Horizontal Canal Right   Horizontal Canal Right Duration none    Horizontal Canal Right Symptoms Normal      Horizontal Canal Left   Horizontal Canal Left Duration none    Horizontal Canal Left Symptoms Normal              Objective measurements completed on examination: See above findings.        Vestibular Treatment/Exercise - 09/17/19 1428      Vestibular Treatment/Exercise   Vestibular Treatment Provided Canalith Repositioning;Habituation;Gaze    Canalith Repositioning Epley Manuever Right    Habituation Exercises Austin Miles    Gaze Exercises X1 Viewing Horizontal       EPLEY MANUEVER RIGHT   Number of Reps  2    Overall Response No change    Response Details  no nystagmus viewed, but continues with 6/10 dizziness that lasts for seconds      Austin Miles   Number of Reps  2    Symptom Description  increased dizziness with return to sitting      X1 Viewing Horizontal   Foot Position seated    Comments gaze x 1 viewing with cues on technique                 PT Education - 09/17/19 1450    Education Details HEP    Person(s) Educated Patient    Methods Explanation;Demonstration;Handout    Comprehension Returned demonstration;Verbalized understanding               PT Long Term Goals - 09/17/19 2219      PT LONG TERM GOAL #1   Title Pt will be indep with HEP.    Time 6    Period Weeks    Target Date 10/29/19      PT LONG TERM GOAL #2   Title Pt will reduce functional limitation per FOTO from 51% to 27%.    Time 6    Period Weeks  Target Date 10/29/19      PT LONG TERM GOAL #3    Title The patient will perform sit<>sidelying without c/o dizziness.    Time 6    Period Weeks    Target Date 10/29/19      PT LONG TERM GOAL #4   Title The patient will tolerate gaze adaptation x 60 seconds with increase in symtpoms < 2/10.    Time 6    Period Weeks    Target Date 10/29/19                  Plan - 09/17/19 2228    Clinical Impression Statement The patient is a 34 yo male presenting to OP physical therapy with 2+ year h/o vertigo.  He has impairments in gaze adaptation (per + head impulse test), motion provoked dizziness, positional symptoms iwth R dix hallpike (did not view nystagmusin room light) and visual motion sensitivity.  PT to address deficits.    Personal Factors and Comorbidities Comorbidity 2    Comorbidities anxiety and depression    Examination-Activity Limitations Bed Mobility    Examination-Participation Restrictions Community Activity    Stability/Clinical Decision Making Stable/Uncomplicated    Clinical Decision Making Low    Rehab Potential Good    PT Frequency 1x / week    PT Duration 6 weeks    PT Treatment/Interventions ADLs/Self Care Home Management;Canalith Repostioning;Vestibular;Patient/family education;Neuromuscular re-education;Balance training;Manual techniques    PT Next Visit Plan recheck R BPPV, brandt daroff for HEP, progress gaze to work up to 30 seconds    Consulted and Agree with Plan of Care Patient           Patient will benefit from skilled therapeutic intervention in order to improve the following deficits and impairments:  Decreased balance, Impaired vision/preception, Dizziness  Visit Diagnosis: Dizziness and giddiness  BPPV (benign paroxysmal positional vertigo), right     Problem List Patient Active Problem List   Diagnosis Date Noted  . Mild intermittent asthma without complication 09/04/2019  . Obesity (BMI 35.0-39.9 without comorbidity) 09/04/2019  . Benign paroxysmal positional vertigo 07/23/2017    . TMJ (temporomandibular joint disorder) 07/23/2017  . Gastroesophageal reflux disease without esophagitis 07/23/2017  . History of herniated intervertebral disc 12/25/2016  . Palpitations 12/25/2016  . Anxiety and depression 12/06/2015  . Chronic bilateral low back pain without sciatica 12/06/2015  . Essential hypertension 07/08/2013    Rosemaria Inabinet, PT 09/17/2019, 10:47 PM  [facility] [address] [place], [place], [address] Phone: [phone]   Fax:  [phone]  Name: Walter Howell MRN: [id number] Date of Birth: [birth year]

## 2019-10-01 ENCOUNTER — Ambulatory Visit (INDEPENDENT_AMBULATORY_CARE_PROVIDER_SITE_OTHER): Payer: 59 | Admitting: Rehabilitative and Restorative Service Providers"

## 2019-10-01 ENCOUNTER — Other Ambulatory Visit: Payer: Self-pay

## 2019-10-01 DIAGNOSIS — R42 Dizziness and giddiness: Secondary | ICD-10-CM

## 2019-10-01 DIAGNOSIS — H8111 Benign paroxysmal vertigo, right ear: Secondary | ICD-10-CM | POA: Diagnosis not present

## 2019-10-01 NOTE — Therapy (Signed)
[facility] [place] [address] [place], [place], [address] Phone: [phone]   Fax:  [phone]  Physical Therapy Treatment  Patient Details  Name: Walter Howell MRN: [id number] Date of Birth: [birth year] Referring Provider (PT): Jonah Blue, MD   Encounter Date: 10/01/2019   PT End of Session - 10/01/19 1600    Visit Number 2    Number of Visits 6    Date for PT Re-Evaluation 10/29/19    Authorization Type [employer]    PT Start Time 1538    PT Stop Time 1610    PT Time Calculation (min) 32 min           Past Medical History:  Diagnosis Date  . Anxiety   . Asthma   . GERD (gastroesophageal reflux disease)   . Hypertension   . Seasonal allergies   . Vitamin D deficiency     No past surgical history on file.  There were no vitals filed for this visit.   Subjective Assessment - 10/01/19 1539    Subjective The patient continues to get some dizziness with laying down.  It has "eased up a whole lot."  The exercises bring on mild dizziness.    Patient Stated Goals reduce vertigo    Currently in Pain? No/denies                   Vestibular Assessment - 10/01/19 1541      Vestibular Assessment   General Observation The patient notes dizziness improved      Dix-Hallpike Right   Dix-Hallpike Right Duration 20 second duration of symptoms    Dix-Hallpike Right Symptoms Upbeat, right rotatory nystagmus   viewed trace nystagmus in room light     Dix-Hallpike Left   Dix-Hallpike Left Duration none    Dix-Hallpike Left Symptoms No nystagmus      Sidelying Right   Sidelying Right Duration none (patient 0/10 going into R sidelying; with return to sitting he got a brief, high intensity 9/10 wave of dizziness that lasted <1 minute)    Sidelying Right Symptoms No nystagmus      Horizontal Canal Right   Horizontal Canal Right Duration mild    Horizontal Canal Right Symptoms Normal       Horizontal Canal Left   Horizontal Canal Left Duration none    Horizontal Canal Left Symptoms Normal                    [facility] Adult PT Treatment/Exercise - 10/01/19 1607      Neuro Re-ed    Neuro Re-ed Details  compliant surface standing with eyes closed, compliant surface standing with head motion            Vestibular Treatment/Exercise - 10/01/19 1549      Vestibular Treatment/Exercise   Vestibular Treatment Provided Canalith Repositioning;Gaze;Habituation    Canalith Repositioning Epley Manuever Right    Habituation Exercises [employer]    Gaze Exercises X1 Viewing Horizontal;X1 Viewing Vertical       EPLEY MANUEVER RIGHT   Number of Reps  1      Brandt Daroff   Number of Reps  2    Symptom Description  modiifed by adding extra pillows for HEP to improve tolerance      X1 Viewing Horizontal   Foot Position seated    Comments 30 seconds horizontal plane      X1 Viewing Vertical   Foot Position seated  Comments 30 seconds                 PT Education - 10/01/19 1618    Education Details modified HEP    Person(s) Educated Patient    Methods Explanation;Demonstration;Handout    Comprehension Verbalized understanding;Returned demonstration               PT Long Term Goals - 09/17/19 2219      PT LONG TERM GOAL #1   Title Pt will be indep with HEP.    Time 6    Period Weeks    Target Date 10/29/19      PT LONG TERM GOAL #2   Title Pt will reduce functional limitation per FOTO from 51% to 27%.    Time 6    Period Weeks    Target Date 10/29/19      PT LONG TERM GOAL #3   Title The patient will perform sit<>sidelying without c/o dizziness.    Time 6    Period Weeks    Target Date 10/29/19      PT LONG TERM GOAL #4   Title The patient will tolerate gaze adaptation x 60 seconds with increase in symtpoms < 2/10.    Time 6    Period Weeks    Target Date 10/29/19                 Plan - 10/01/19 1620    Clinical  Impression Statement The patient  is progressing well noting reduction in daily symptoms.  PT progressed gaze to 30 sec duration and added habituation to HEP.  Plan to continue working to LTGs.    Personal Factors and Comorbidities Comorbidity 2    Comorbidities anxiety and depression    Examination-Activity Limitations Bed Mobility    Examination-Participation Restrictions Community Activity    Stability/Clinical Decision Making Stable/Uncomplicated    Rehab Potential Good    PT Frequency 1x / week    PT Duration 6 weeks    PT Treatment/Interventions ADLs/Self Care Home Management;Canalith Repostioning;Vestibular;Patient/family education;Neuromuscular re-education;Balance training;Manual techniques    PT Next Visit Plan recheck R BPPV, brandt daroff for HEP, progress gaze to work up to 30 seconds    Consulted and Agree with Plan of Care Patient           Patient will benefit from skilled therapeutic intervention in order to improve the following deficits and impairments:  Decreased balance, Impaired vision/preception, Dizziness  Visit Diagnosis: Dizziness and giddiness  BPPV (benign paroxysmal positional vertigo), right     Problem List Patient Active Problem List   Diagnosis Date Noted  . Mild intermittent asthma without complication 09/04/2019  . Obesity (BMI 35.0-39.9 without comorbidity) 09/04/2019  . Benign paroxysmal positional vertigo 07/23/2017  . TMJ (temporomandibular joint disorder) 07/23/2017  . Gastroesophageal reflux disease without esophagitis 07/23/2017  . History of herniated intervertebral disc 12/25/2016  . Palpitations 12/25/2016  . Anxiety and depression 12/06/2015  . Chronic bilateral low back pain without sciatica 12/06/2015  . Essential hypertension 07/08/2013    Kloee Ballew, PT 10/01/2019, 4:21 PM  [facility] [address] [place], [place], [address] Phone: [phone]   Fax:   [phone]  Name: Walter Howell MRN: [id number] Date of Birth: 08-28-1985

## 2019-10-01 NOTE — Patient Instructions (Signed)
Access Code: [email or web address] URL: [email or web address] Date: 10/01/2019 Prepared by: Margretta Ditty  Program Notes Symptoms should stay at 4/10 or less.    Exercises Standing Gaze Stabilization with Head Rotation - 2 x daily - 7 x weekly - 1 sets - 30 seconds hold Standing Gaze Stabilization with Head Nod - 2 x daily - 7 x weekly - 1 sets - 30 seconds hold Brandt-Daroff Vestibular Exercise - 2 x daily - 7 x weekly - 1 sets - 5 reps Wide Stance with Head Rotation on Foam Pad - 2 x daily - 7 x weekly - 1 sets - 5-10 reps

## 2019-10-12 ENCOUNTER — Encounter: Payer: 59 | Admitting: Rehabilitative and Restorative Service Providers"

## 2019-10-23 ENCOUNTER — Ambulatory Visit (INDEPENDENT_AMBULATORY_CARE_PROVIDER_SITE_OTHER): Payer: 59 | Admitting: Rehabilitative and Restorative Service Providers"

## 2019-10-23 ENCOUNTER — Other Ambulatory Visit: Payer: Self-pay

## 2019-10-23 VITALS — BP 138/71

## 2019-10-23 DIAGNOSIS — H8111 Benign paroxysmal vertigo, right ear: Secondary | ICD-10-CM

## 2019-10-23 DIAGNOSIS — R42 Dizziness and giddiness: Secondary | ICD-10-CM

## 2019-10-23 NOTE — Therapy (Addendum)
[facility] [place], [place], [address] Phone: [phone]   Fax:  [phone]  Physical Therapy Treatment and Discharge Summary  Patient Details  Name: Walter Howell MRN: [id number] Date of Birth: May 23, 1985 Referring Provider (PT): Karle Plumber, MD   Encounter Date: 10/23/2019   PT End of Session - 10/23/19 1412    Visit Number 3    Number of Visits 6    Date for PT Re-Evaluation 10/29/19    Authorization Type [employer]    PT Start Time 4010    PT Stop Time 1445    PT Time Calculation (min) 38 min    Activity Tolerance Patient tolerated treatment well    Behavior During Therapy [facility] for tasks assessed/performed           Past Medical History:  Diagnosis Date  . Anxiety   . Asthma   . GERD (gastroesophageal reflux disease)   . Hypertension   . Seasonal allergies   . Vitamin D deficiency     No past surgical history on file.  Vitals:   10/23/19 1439  BP: 138/71     Subjective Assessment - 10/23/19 1411    Subjective The patient reports improvement in dizziness.  He only notes occasional symptoms when lying on his right side.    Patient Stated Goals reduce vertigo    Currently in Pain? No/denies                   Vestibular Assessment - 10/23/19 1420      Vestibular Assessment   General Observation Patient is not noticing dizziness in daily activities except when rolling R occasionally or quickly bending to pick something up.       Positional Testing   Dix-Hallpike Dix-Hallpike Right      Dix-Hallpike Right   Dix-Hallpike Right Duration trace amount of spinning sensation x 20 reps    Dix-Hallpike Right Symptoms No nystagmus   viewed in room light                   [facility] Adult PT Treatment/Exercise - 10/23/19 1435      Self-Care   Self-Care Other Self-Care Comments    Other Self-Care Comments  discussed continuation of HEP             Vestibular Treatment/Exercise - 10/23/19 1415      Vestibular Treatment/Exercise   Vestibular Treatment Provided Gaze;Habituation;Canalith Repositioning    Canalith Repositioning Epley Manuever Right;Canal Roll Right    Habituation Exercises [employer]    Gaze Exercises X1 Viewing Horizontal;X1 Viewing Vertical       EPLEY MANUEVER RIGHT   Number of Reps  2    Overall Response Symptoms Resolved    Response Details  improved to no nystagmus or subjective reports of dizziness      Canal Roll Right   Number of Reps  1    Response Details  due to change in nystagmus to horizontal, then did 1 more rep of Epley maneuver R      Nestor Lewandowsky   Number of Reps  2    Symptom Description  dizziness with return to sitting from R      X1 Viewing Horizontal   Foot Position seated    Comments 30 seconds with 3/10      X1 Viewing Vertical   Foot Position seated    Comments 30 seconds with 4/10 dizziness  PT Long Term Goals - 09/17/19 2219      PT LONG TERM GOAL #1   Title Pt will be indep with HEP.    Time 6    Period Weeks    Target Date 10/29/19      PT LONG TERM GOAL #2   Title Pt will reduce functional limitation per FOTO from 51% to 27%.    Time 6    Period Weeks    Target Date 10/29/19      PT LONG TERM GOAL #3   Title The patient will perform sit<>sidelying without c/o dizziness.    Time 6    Period Weeks    Target Date 10/29/19      PT LONG TERM GOAL #4   Title The patient will tolerate gaze adaptation x 60 seconds with increase in symtpoms < 2/10.    Time 6    Period Weeks    Target Date 10/29/19                 Plan - 10/23/19 1437    Clinical Impression Statement The patient continues with mild dizziness, but reports 8/10 improvement (80% better) since eval.  He has lightheadedness with return to sitting and BP has been elevated.  He reports that he notices a correlation with how he feels (lightheadedness) and meds.  PT  recommended continue current HEP and f/u with MD for check up.    Personal Factors and Comorbidities --    Comorbidities --    Examination-Activity Limitations --    Examination-Participation Restrictions --    Stability/Clinical Decision Making Stable/Uncomplicated    Rehab Potential Good    PT Frequency 1x / week    PT Duration 6 weeks    PT Treatment/Interventions ADLs/Self Care Home Management;Canalith Repostioning;Vestibular;Patient/family education;Neuromuscular re-education;Balance training;Manual techniques    PT Next Visit Plan patient to f/u with MD and continue HEP/ will reassess as needed.    Consulted and Agree with Plan of Care Patient           Patient will benefit from skilled therapeutic intervention in order to improve the following deficits and impairments:  Decreased balance, Impaired vision/preception, Dizziness  Visit Diagnosis: BPPV (benign paroxysmal positional vertigo), right  Dizziness and giddiness     Problem List Patient Active Problem List   Diagnosis Date Noted  . Mild intermittent asthma without complication 90/24/0973  . Obesity (BMI 35.0-39.9 without comorbidity) 09/04/2019  . Benign paroxysmal positional vertigo 07/23/2017  . TMJ (temporomandibular joint disorder) 07/23/2017  . Gastroesophageal reflux disease without esophagitis 07/23/2017  . History of herniated intervertebral disc 12/25/2016  . Palpitations 12/25/2016  . Anxiety and depression 12/06/2015  . Chronic bilateral low back pain without sciatica 12/06/2015  . Essential hypertension 07/08/2013    PHYSICAL THERAPY DISCHARGE SUMMARY  Visits from Start of Care: 3  Current functional level related to goals / functional outcomes: No goals assessed-- patient did not return   Remaining deficits: See above for last known status- did not return.  Patient was reporting 80% improvement.  Education / Equipment: Home program  Plan: Patient agrees to discharge.  Patient goals were  not met. Patient is being discharged due to not returning since the last visit.  ?????         Thank you for the referral of this patient. Rudell Cobb, MPT  [place], PT 10/23/2019, 2:48 PM  [facility] [place], [place], [address] Phone: [phone]  Fax:  [phone]  Name: Walter Howell MRN: [id number] Date of Birth: 1986/01/03

## 2019-10-29 ENCOUNTER — Telehealth: Payer: Self-pay | Admitting: Internal Medicine

## 2019-10-29 NOTE — Telephone Encounter (Signed)
Returned patient call and offered a sooner appt with a different provider but patient states that he already has an appt with a different clinic.   Copied from CRM [id number]. Topic: General - Other >> Oct 23, 2019  2:49 PM Jaquita Rector A wrote: Reason for CRM: Patient called to inform Dr Laural Benes that he is having some issues with his BP elevating and was told by his therapist that he need to be seen right away. First appointment available was 12/22/19 Please call patient at Ph# [phone]

## 2019-10-30 ENCOUNTER — Other Ambulatory Visit: Payer: Self-pay | Admitting: Internal Medicine

## 2019-10-30 DIAGNOSIS — J4520 Mild intermittent asthma, uncomplicated: Secondary | ICD-10-CM

## 2019-10-30 NOTE — Telephone Encounter (Signed)
Medication Refill - Medication: albuterol (VENTOLIN HFA) 108 (90 Base) MCG/ACT inhaler   Has the patient contacted their pharmacy?Yes (Agent: If no, request that the patient contact the pharmacy for the refill.) (Agent: If yes, when and what did the pharmacy advise?)Contact PCP  Preferred Pharmacy (with phone number or street name):  [id number] - [place], [place] - [address] Phone:  [phone]  Fax:  [phone]       Agent: Please be advised that RX refills may take up to 3 business days. We ask that you follow-up with your pharmacy.

## 2019-10-30 NOTE — Telephone Encounter (Signed)
Pharmacy has script on file and will get it ready for patient.

## 2019-11-23 ENCOUNTER — Other Ambulatory Visit: Payer: Self-pay

## 2019-11-23 ENCOUNTER — Encounter: Payer: Self-pay | Admitting: Family Medicine

## 2019-11-23 ENCOUNTER — Ambulatory Visit (INDEPENDENT_AMBULATORY_CARE_PROVIDER_SITE_OTHER): Payer: 59 | Admitting: Family Medicine

## 2019-11-23 VITALS — BP 154/88 | HR 81 | Temp 98.1°F | Resp 17 | Ht 71.0 in | Wt 263.2 lb

## 2019-11-23 DIAGNOSIS — K0889 Other specified disorders of teeth and supporting structures: Secondary | ICD-10-CM

## 2019-11-23 DIAGNOSIS — F329 Major depressive disorder, single episode, unspecified: Secondary | ICD-10-CM

## 2019-11-23 DIAGNOSIS — R42 Dizziness and giddiness: Secondary | ICD-10-CM

## 2019-11-23 DIAGNOSIS — Z0000 Encounter for general adult medical examination without abnormal findings: Secondary | ICD-10-CM | POA: Diagnosis not present

## 2019-11-23 DIAGNOSIS — K029 Dental caries, unspecified: Secondary | ICD-10-CM

## 2019-11-23 DIAGNOSIS — J4520 Mild intermittent asthma, uncomplicated: Secondary | ICD-10-CM

## 2019-11-23 DIAGNOSIS — Z7689 Persons encountering health services in other specified circumstances: Secondary | ICD-10-CM

## 2019-11-23 DIAGNOSIS — F419 Anxiety disorder, unspecified: Secondary | ICD-10-CM

## 2019-11-23 DIAGNOSIS — K047 Periapical abscess without sinus: Secondary | ICD-10-CM

## 2019-11-23 DIAGNOSIS — H8110 Benign paroxysmal vertigo, unspecified ear: Secondary | ICD-10-CM

## 2019-11-23 DIAGNOSIS — F32A Depression, unspecified: Secondary | ICD-10-CM

## 2019-11-23 DIAGNOSIS — Z09 Encounter for follow-up examination after completed treatment for conditions other than malignant neoplasm: Secondary | ICD-10-CM

## 2019-11-23 DIAGNOSIS — I10 Essential (primary) hypertension: Secondary | ICD-10-CM

## 2019-11-23 LAB — POCT GLYCOSYLATED HEMOGLOBIN (HGB A1C)
HbA1c POC (<> result, manual entry): 5.6 % (ref 4.0–5.6)
HbA1c, POC (controlled diabetic range): 5.6 % (ref 0.0–7.0)
HbA1c, POC (prediabetic range): 5.6 % — AB (ref 5.7–6.4)
Hemoglobin A1C: 5.6 % (ref 4.0–5.6)

## 2019-11-23 LAB — GLUCOSE, POCT (MANUAL RESULT ENTRY): POC Glucose: 179 mg/dl — AB (ref 70–99)

## 2019-11-23 MED ORDER — ALBUTEROL SULFATE HFA 108 (90 BASE) MCG/ACT IN AERS: 2.0000 | INHALATION_SPRAY | Freq: Four times a day (QID) | RESPIRATORY_TRACT | 11 refills | Status: DC | PRN

## 2019-11-23 MED ORDER — IBUPROFEN 800 MG PO TABS: 800.0000 mg | ORAL_TABLET | Freq: Three times a day (TID) | ORAL | 3 refills | Status: DC | PRN

## 2019-11-23 MED ORDER — OMEPRAZOLE 20 MG PO CPDR
20.0000 mg | DELAYED_RELEASE_CAPSULE | Freq: Every day | ORAL | 3 refills | Status: DC
Start: 2019-11-23 — End: 2020-09-13

## 2019-11-23 MED ORDER — MECLIZINE HCL 25 MG PO TABS: 25.0000 mg | ORAL_TABLET | Freq: Three times a day (TID) | ORAL | 6 refills | Status: DC | PRN

## 2019-11-23 MED ORDER — AMLODIPINE BESYLATE 5 MG PO TABS: 5.0000 mg | ORAL_TABLET | Freq: Every day | ORAL | 3 refills | Status: DC

## 2019-11-23 MED ORDER — CETIRIZINE HCL 10 MG PO TABS
10.0000 mg | ORAL_TABLET | Freq: Every day | ORAL | 3 refills | Status: DC
Start: 2019-11-23 — End: 2020-09-13

## 2019-11-23 MED ORDER — AMOXICILLIN-POT CLAVULANATE 875-125 MG PO TABS: 1.0000 | ORAL_TABLET | Freq: Two times a day (BID) | ORAL | 0 refills | Status: DC

## 2019-11-23 MED ORDER — HYDROXYZINE HCL 25 MG PO TABS: 25.0000 mg | ORAL_TABLET | Freq: Three times a day (TID) | ORAL | 3 refills | Status: DC | PRN

## 2019-11-23 NOTE — Patient Instructions (Addendum)
Ibuprofen tablets and capsules What is this medicine? IBUPROFEN (eye BYOO proe fen) is a non-steroidal anti-inflammatory drug (NSAID). It is used for dental pain, fever, headaches or migraines, osteoarthritis, rheumatoid arthritis, or painful monthly periods. It can also relieve minor aches and pains caused by a cold, flu, or sore throat. This medicine may be used for other purposes; ask your health care provider or pharmacist if you have questions. COMMON BRAND NAME(S): Advil, Advil Junior Strength, Advil Migraine, Genpril, Ibren, IBU, Ibupak, Midol, Midol Cramps and Body Aches, Motrin, Motrin IB, Motrin Junior Strength, Motrin Migraine Pain, Samson-8, Toxicology Saliva Collection What should I tell my health care provider before I take this medicine? They need to know if you have any of these conditions:  cigarette smoker  coronary artery bypass graft (CABG) surgery within the past 2 weeks  drink more than 3 alcohol-containing drinks a day  heart disease  high blood pressure  history of stomach bleeding  kidney disease  liver disease  lung or breathing disease, like asthma  an unusual or allergic reaction to ibuprofen, aspirin, other NSAIDs, other medicines, foods, dyes, or preservatives  pregnant or trying to get pregnant  breast-feeding How should I use this medicine? Take this medicine by mouth with a glass of water. Follow the directions on the prescription label. Take this medicine with food if your stomach gets upset. Try to not lie down for at least 10 minutes after you take the medicine. Take your medicine at regular intervals. Do not take your medicine more often than directed. A special MedGuide will be given to you by the pharmacist with each prescription and refill. Be sure to read this information carefully each time. Talk to your pediatrician regarding the use of this medicine in children. Special care may be needed. Overdosage: If you think you have taken too much  of this medicine contact a poison control center or emergency room at once. NOTE: This medicine is only for you. Do not share this medicine with others. What if I miss a dose? If you miss a dose, take it as soon as you can. If it is almost time for your next dose, take only that dose. Do not take double or extra doses. What may interact with this medicine? Do not take this medicine with any of the following medications:  cidofovir  ketorolac  methotrexate  pemetrexed This medicine may also interact with the following medications:  alcohol  aspirin  diuretics  lithium  other drugs for inflammation like prednisone  warfarin This list may not describe all possible interactions. Give your health care provider a list of all the medicines, herbs, non-prescription drugs, or dietary supplements you use. Also tell them if you smoke, drink alcohol, or use illegal drugs. Some items may interact with your medicine. What should I watch for while using this medicine? Tell your doctor or healthcare provider if your symptoms do not start to get better or if they get worse. This medicine may cause serious skin reactions. They can happen weeks to months after starting the medicine. Contact your healthcare provider right away if you notice fevers or flu-like symptoms with a rash. The rash may be red or purple and then turn into blisters or peeling of the skin. Or, you might notice a red rash with swelling of the face, lips or lymph nodes in your neck or under your arms. This medicine does not prevent heart attack or stroke. In fact, this medicine may increase the chance of a  heart attack or stroke. The chance may increase with longer use of this medicine and in people who have heart disease. If you take aspirin to prevent heart attack or stroke, talk with your doctor or healthcare provider. Do not take other medicines that contain aspirin, ibuprofen, or naproxen with this medicine. Side effects such as  stomach upset, nausea, or ulcers may be more likely to occur. Many medicines available without a prescription should not be taken with this medicine. This medicine can cause ulcers and bleeding in the stomach and intestines at any time during treatment. Ulcers and bleeding can happen without warning symptoms and can cause death. To reduce your risk, do not smoke cigarettes or drink alcohol while you are taking this medicine. You may get drowsy or dizzy. Do not drive, use machinery, or do anything that needs mental alertness until you know how this medicine affects you. Do not stand or sit up quickly, especially if you are an older patient. This reduces the risk of dizzy or fainting spells. This medicine can cause you to bleed more easily. Try to avoid damage to your teeth and gums when you brush or floss your teeth. This medicine may be used to treat migraines. If you take migraine medicines for 10 or more days a month, your migraines may get worse. Keep a diary of headache days and medicine use. Contact your healthcare provider if your migraine attacks occur more frequently. What side effects may I notice from receiving this medicine? Side effects that you should report to your doctor or health care professional as soon as possible:  allergic reactions like skin rash, itching or hives, swelling of the face, lips, or tongue  redness, blistering, peeling or loosening of the skin, including inside the mouth  severe stomach pain  signs and symptoms of bleeding such as bloody or black, tarry stools; red or dark-brown urine; spitting up blood or brown material that looks like coffee grounds; red spots on the skin; unusual bruising or bleeding from the eye, gums, or nose  signs and symptoms of a blood clot such as changes in vision; chest pain; severe, sudden headache; trouble speaking; sudden numbness or weakness of the face, arm, or leg  unexplained weight gain or swelling  unusually weak or  tired  yellowing of eyes or skin Side effects that usually do not require medical attention (report to your doctor or health care professional if they continue or are bothersome):  bruising  diarrhea  dizziness, drowsiness  headache  nausea, vomiting This list may not describe all possible side effects. Call your doctor for medical advice about side effects. You may report side effects to FDA at 1-800-FDA-1088. Where should I keep my medicine? Keep out of the reach of children. Store at room temperature between 15 and 30 degrees C (59 and 86 degrees F). Keep container tightly closed. Throw away any unused medicine after the expiration date. NOTE: This sheet is a summary. It may not cover all possible information. If you have questions about this medicine, talk to your doctor, pharmacist, or health care provider.  2020 Elsevier/Gold Standard (2018-04-30 14:11:00) Amoxicillin; Clavulanic Acid Tablets What is this medicine? AMOXICILLIN; CLAVULANIC ACID (a mox i SIL in; KLAV yoo lan ic AS id) is a penicillin antibiotic. It treats some infections caused by bacteria. It will not work for colds, the flu, or other viruses. This medicine may be used for other purposes; ask your health care provider or pharmacist if you have questions. COMMON BRAND  NAME(S): Augmentin What should I tell my health care provider before I take this medicine? They need to know if you have any of these conditions:  bowel disease, like colitis  kidney disease  liver disease  mononucleosis  an unusual or allergic reaction to amoxicillin, penicillin, cephalosporin, other antibiotics, clavulanic acid, other medicines, foods, dyes, or preservatives  pregnant or trying to get pregnant  breast-feeding How should I use this medicine? Take this drug by mouth. Take it as directed on the prescription label at the same time every day. Take it with food at the start of a meal or snack. Take all of this drug unless your  health care provider tells you to stop it early. Keep taking it even if you think you are better. Talk to your health care provider about the use of this drug in children. While it may be prescribed for selected conditions, precautions do apply. Overdosage: If you think you have taken too much of this medicine contact a poison control center or emergency room at once. NOTE: This medicine is only for you. Do not share this medicine with others. What if I miss a dose? If you miss a dose, take it as soon as you can. If it is almost time for your next dose, take only that dose. Do not take double or extra doses. What may interact with this medicine?  allopurinol  anticoagulants  birth control pills  methotrexate  probenecid This list may not describe all possible interactions. Give your health care provider a list of all the medicines, herbs, non-prescription drugs, or dietary supplements you use. Also tell them if you smoke, drink alcohol, or use illegal drugs. Some items may interact with your medicine. What should I watch for while using this medicine? Tell your doctor or healthcare provider if your symptoms do not improve. This medicine may cause serious skin reactions. They can happen weeks to months after starting the medicine. Contact your healthcare provider right away if you notice fevers or flu-like symptoms with a rash. The rash may be red or purple and then turn into blisters or peeling of the skin. Or, you might notice a red rash with swelling of the face, lips or lymph nodes in your neck or under your arms. Do not treat diarrhea with over the counter products. Contact your doctor if you have diarrhea that lasts more than 2 days or if it is severe and watery. If you have diabetes, you may get a false-positive result for sugar in your urine. Check with your doctor or healthcare provider. Birth control pills may not work properly while you are taking this medicine. Talk to your doctor  about using an extra method of birth control. What side effects may I notice from receiving this medicine? Side effects that you should report to your doctor or health care professional as soon as possible:  allergic reactions like skin rash, itching or hives, swelling of the face, lips, or tongue  breathing problems  dark urine  fever or chills, sore throat  redness, blistering, peeling, or loosening of the skin, including inside the mouth  seizures  trouble passing urine or change in the amount of urine  unusual bleeding, bruising  unusually weak or tired  white patches or sores in the mouth or throat Side effects that usually do not require medical attention (report to your doctor or health care professional if they continue or are bothersome):  diarrhea  dizziness  headache  nausea, vomiting  stomach  upset  vaginal or anal irritation This list may not describe all possible side effects. Call your doctor for medical advice about side effects. You may report side effects to FDA at 1-800-FDA-1088. Where should I keep my medicine? Keep out of the reach of children and pets. Store at room temperature between 20 and 25 degrees C (68 and 77 degrees F). Throw away any unused drug after the expiration date. NOTE: This sheet is a summary. It may not cover all possible information. If you have questions about this medicine, talk to your doctor, pharmacist, or health care provider.  2020 Elsevier/Gold Standard (2018-09-15 11:55:53) Dental Abscess  A dental abscess is an area of pus in or around a tooth. It comes from an infection. It can cause pain and other symptoms. Treatment will help with symptoms and prevent the infection from spreading. Follow these instructions at home: Medicines  Take over-the-counter and prescription medicines only as told by your dentist.  If you were prescribed an antibiotic medicine, take it as told by your dentist. Do not stop taking it even if  you start to feel better.  If you were prescribed a gel that has numbing medicine in it, use it exactly as told.  Do not drive or use heavy machinery (like a Conservation officer, nature) while taking prescription pain medicine. General instructions  Rinse out your mouth often with salt water. ? To make salt water, dissolve -1 tsp of salt in 1 cup of warm water.  Eat a soft diet while your mouth is healing.  Drink enough fluid to keep your urine pale yellow.  Do not apply heat to the outside of your mouth.  Do not use any products that contain nicotine or tobacco. These include cigarettes and e-cigarettes. If you need help quitting, ask your doctor.  Keep all follow-up visits as told by your dentist. This is important. Prevent an abscess  Brush your teeth every morning and every night. Use fluoride toothpaste.  Floss your teeth each day.  Get dental cleanings as often as told by your dentist.  Think about getting dental sealant put on teeth that have deep holes (decay).  Drink water that has fluoride in it. ? Most tap water has fluoride. ? Check the label on bottled water to see if it has fluoride in it.  Drink water instead of sugary drinks.  Eat healthy meals and snacks.  Wear a mouth guard or face shield when you play sports. Contact a doctor if:  Your pain is worse, and medicine does not help. Get help right away if:  You have a fever or chills.  Your symptoms suddenly get worse.  You have a very bad headache.  You have problems breathing or swallowing.  You have trouble opening your mouth.  You have swelling in your neck or close to your eye. Summary  A dental abscess is an area of pus in or around a tooth. It is caused by an infection.  Treatment will help with symptoms and prevent the infection from spreading.  Take over-the-counter and prescription medicines only as told by your dentist.  To prevent an abscess, take good care of your teeth. Brush your teeth every  morning and night. Use floss every day.  Get dental cleanings as often as told by your dentist. This information is not intended to replace advice given to you by your health care provider. Make sure you discuss any questions you have with your health care provider. Document Revised: 06/04/2018 Document Reviewed:  10/15/2016 Elsevier Patient Education  2020 [employer]  Vertigo Vertigo is the feeling that you or the things around you are moving when they are not. This feeling can come and go at any time. Vertigo often goes away on its own. This condition can be dangerous if it happens when you are doing activities like driving or working with machines. Your doctor will do tests to find the cause of your vertigo. These tests will also help your doctor decide on the best treatment for you. Follow these instructions at home: Eating and drinking      Drink enough fluid to keep your pee (urine) pale yellow.  Do not drink alcohol. Activity  Return to your normal activities as told by your doctor. Ask your doctor what activities are safe for you.  In the morning, first sit up on the side of the bed. When you feel okay, stand slowly while you hold onto something until you know that your balance is fine.  Move slowly. Avoid sudden body or head movements or certain positions, as told by your doctor.  Use a cane if you have trouble standing or walking.  Sit down right away if you feel dizzy.  Avoid doing any tasks or activities that can cause danger to you or others if you get dizzy.  Avoid bending down if you feel dizzy. Place items in your home so that they are easy for you to reach without leaning over.  Do not drive or use heavy machinery if you feel dizzy. General instructions  Take over-the-counter and prescription medicines only as told by your doctor.  Keep all follow-up visits as told by your doctor. This is important. Contact a doctor if:  Your medicine does not help your  vertigo.  You have a fever.  Your problems get worse or you have new symptoms.  Your family or friends see changes in your behavior.  The feeling of being sick to your stomach gets worse.  Your vomiting gets worse.  You lose feeling (have numbness) in part of your body.  You feel prickling and tingling in a part of your body. Get help right away if:  You have trouble moving or talking.  You are always dizzy.  You pass out (faint).  You get very bad headaches.  You feel weak in your hands, arms, or legs.  You have changes in your hearing.  You have changes in how you see (vision).  You get a stiff neck.  Bright light starts to bother you. Summary  Vertigo is the feeling that you or the things around you are moving when they are not.  Your doctor will do tests to find the cause of your vertigo.  You may be told to avoid some tasks, positions, or movements.  Contact a doctor if your medicine is not helping, or if you have a fever, new symptoms, or a change in behavior.  Get help right away if you get very bad headaches, or if you have changes in how you speak, hear, or see. This information is not intended to replace advice given to you by your health care provider. Make sure you discuss any questions you have with your health care provider. Document Revised: 01/06/2018 Document Reviewed: 01/06/2018 Elsevier Patient Education  2020 [employer].

## 2019-11-23 NOTE — Progress Notes (Signed)
[facility]    New Patient--Urgent Care Follow Up--Establish  Subjective:  Patient ID: Walter Howell, male    DOB: Feb 20, 1986  Age: 34 y.o. MRN: [id number]  CC:  Chief Complaint  Patient presents with  . Follow-up    Pt states he is having some dizzy spells. Pt states he was told is BP medication might be the reason for is dizzy spells.X1-2 yrs.    HPI Walter Howell is a 34 year old male who presents for Hospital Follow Up and to Establish Care today.    Patient Active Problem List   Diagnosis Date Noted  . Mild intermittent asthma without complication 09/04/2019  . Obesity (BMI 35.0-39.9 without comorbidity) 09/04/2019  . Benign paroxysmal positional vertigo 07/23/2017  . TMJ (temporomandibular joint disorder) 07/23/2017  . Gastroesophageal reflux disease without esophagitis 07/23/2017  . History of herniated intervertebral disc 12/25/2016  . Palpitations 12/25/2016  . Anxiety and depression 12/06/2015  . Chronic bilateral low back pain without sciatica 12/06/2015  . Essential hypertension 07/08/2013   Current Status: This will be Walter Howell initial office visit with me. He was previously not seeing a Physician for his PCP needs. Since his last office visit, he has had an Urgent Care visit on 07/29/2019 for Dizziness and Vertigo. He has also been treated with 4 Physical Therapy treatments, which have not been effective. Today, he reports that dizziness spells still persist. He denies fevers, chills, fatigue, recent infections, weight loss, and night sweats. He has not had any headaches, visual changes, dizziness, and falls. No chest pain, heart palpitations, cough and shortness of breath reported. No reports of GI problems such as nausea, vomiting, diarrhea, and constipation. He has no reports of blood in stools, dysuria and hematuria. No depression or anxiety, and denies suicidal ideations, homicidal ideations, or  auditory hallucinations. He is taking all medications as prescribed. He denies pain today.  Past Medical History:  Diagnosis Date  . Anxiety   . Asthma   . Dizziness   . GERD (gastroesophageal reflux disease)   . Hypertension   . Seasonal allergies   . Vertigo   . Vitamin D deficiency     History reviewed. No pertinent surgical history.  Family History  Problem Relation Age of Onset  . Diabetes Maternal Grandmother   . Hypertension Maternal Grandmother   . Diabetes Maternal Grandfather   . Diabetes Paternal Grandmother   . Heart disease Paternal Grandmother   . Diabetes Paternal Grandfather     Social History   Socioeconomic History  . Marital status: Significant Other    Spouse name: Not on file  . Number of children: Not on file  . Years of education: Not on file  . Highest education level: Not on file  Occupational History  . Not on file  Tobacco Use  . Smoking status: Former Games developer  . Smokeless tobacco: Never Used  Vaping Use  . Vaping Use: Never used  Substance and Sexual Activity  . Alcohol use: No  . Drug use: No  . Sexual activity: Not on file  Other Topics Concern  . Not on file  Social History Narrative  . Not on file   Social Determinants of Health   Financial Resource Strain:   . Difficulty of Paying Living Expenses: Not on file  Food Insecurity:   . Worried About Programme researcher, broadcasting/film/video in the Last Year: Not on file  . Ran Out of Food in the  Last Year: Not on file  Transportation Needs:   . Lack of Transportation (Medical): Not on file  . Lack of Transportation (Non-Medical): Not on file  Physical Activity:   . Days of Exercise per Week: Not on file  . Minutes of Exercise per Session: Not on file  Stress:   . Feeling of Stress : Not on file  Social Connections:   . Frequency of Communication with Friends and Family: Not on file  . Frequency of Social Gatherings with Friends and Family: Not on file  . Attends Religious Services: Not on file    . Active Member of Clubs or Organizations: Not on file  . Attends Banker Meetings: Not on file  . Marital Status: Not on file  Intimate Partner Violence:   . Fear of Current or Ex-Partner: Not on file  . Emotionally Abused: Not on file  . Physically Abused: Not on file  . Sexually Abused: Not on file    Outpatient Medications Prior to Visit  Medication Sig Dispense Refill  . acetaminophen (TYLENOL) 500 MG tablet Take 1,000 mg by mouth every 6 (six) hours as needed for mild pain, moderate pain, fever or headache.    . chlorthalidone (HYGROTON) 25 MG tablet Take 1 tablet (25 mg total) by mouth daily. 30 tablet 6  . Multiple Vitamin (MULTIVITAMIN WITH MINERALS) TABS tablet Take 1 tablet by mouth daily.    . Vitamin D, Ergocalciferol, (DRISDOL) 1.25 MG (50000 UT) CAPS capsule Take 1 capsule (50,000 Units total) by mouth every 7 (seven) days. 16 capsule 0  . albuterol (VENTOLIN HFA) 108 (90 Base) MCG/ACT inhaler Inhale 2 puffs into the lungs every 6 (six) hours as needed for wheezing or shortness of breath. 8 g 3  . cetirizine (ZYRTEC) 10 MG tablet Take 10 mg by mouth daily.    . hydrOXYzine (ATARAX/VISTARIL) 25 MG tablet Take 1 tablet (25 mg total) by mouth every 8 (eight) hours as needed. For anxiety 30 tablet 3  . busPIRone (BUSPAR) 15 MG tablet Take 1 tablet (15 mg total) by mouth 2 (two) times daily. (Patient not taking: Reported on 11/23/2019) 60 tablet 3  . fluticasone (FLONASE) 50 MCG/ACT nasal spray Place 1-2 sprays into both nostrils daily for 7 days. 1 g 0  . amLODipine (NORVASC) 10 MG tablet Take 1 tablet (10 mg total) by mouth daily. (Patient not taking: Reported on 11/23/2019) 30 tablet 6  . meclizine (ANTIVERT) 12.5 MG tablet Take 1 tablet (12.5 mg total) by mouth 3 (three) times daily as needed for dizziness. (Patient not taking: Reported on 11/23/2019) 30 tablet 0  . omeprazole (PRILOSEC) 20 MG capsule Take 1 capsule (20 mg total) by mouth daily. (Patient not taking:  Reported on 11/23/2019) 30 capsule 3   No facility-administered medications prior to visit.    Allergies  Allergen Reactions  . Prednisone     "makes him feel weird"    ROS Review of Systems  Constitutional: Negative.   HENT: Positive for dental problem (occasional ).   Eyes: Negative.   Respiratory: Negative.   Cardiovascular: Negative.   Gastrointestinal: Positive for abdominal distention.  Endocrine: Negative.   Genitourinary: Negative.   Musculoskeletal: Negative.   Skin: Negative.   Allergic/Immunologic: Negative.   Neurological: Positive for dizziness (frequent) and headaches (occasional ).  Hematological: Negative.   Psychiatric/Behavioral: Negative.       Objective:    Physical Exam Vitals and nursing note reviewed.  Constitutional:      Appearance:  Normal appearance.  HENT:     Head: Normocephalic and atraumatic.     Nose: Nose normal.     Mouth/Throat:     Mouth: Mucous membranes are moist.     Pharynx: Oropharynx is clear.     Comments: Dental caries Cardiovascular:     Rate and Rhythm: Normal rate and regular rhythm.     Pulses: Normal pulses.     Heart sounds: Normal heart sounds.  Pulmonary:     Effort: Pulmonary effort is normal.     Breath sounds: Normal breath sounds.  Abdominal:     General: Bowel sounds are normal. There is distension (occasional).     Palpations: Abdomen is soft.  Musculoskeletal:        General: Normal range of motion.     Cervical back: Normal range of motion and neck supple.  Skin:    General: Skin is warm and dry.  Neurological:     General: No focal deficit present.     Mental Status: He is alert and oriented to person, place, and time.  Psychiatric:        Mood and Affect: Mood normal.        Behavior: Behavior normal.        Thought Content: Thought content normal.        Judgment: Judgment normal.     BP (!) 154/88 (BP Location: Left Arm, Patient Position: Sitting, Cuff Size: Large)   Pulse 81   Temp  98.1 F (36.7 C)   Resp 17   Ht 5\' 11"  (1.803 m)   Wt 263 lb 3.2 oz (119.4 kg)   SpO2 100%   BMI 36.71 kg/m  Wt Readings from Last 3 Encounters:  11/23/19 263 lb 3.2 oz (119.4 kg)  09/04/19 262 lb 6.4 oz (119 kg)  07/29/19 263 lb (119.3 kg)     Health Maintenance Due  Topic Date Due  . Hepatitis C Screening  Never done  . COVID-19 Vaccine (1) Never done  . TETANUS/TDAP  Never done  . INFLUENZA VACCINE  Never done    There are no preventive care reminders to display for this patient.  Lab Results  Component Value Date   TSH 1.170 02/12/2019   Lab Results  Component Value Date   WBC 4.9 02/12/2019   HGB 16.9 02/12/2019   HCT 49.5 02/12/2019   MCV 92 02/12/2019   PLT 243 02/12/2019   Lab Results  Component Value Date   NA 141 02/12/2019   K 4.0 02/12/2019   CO2 25 02/12/2019   GLUCOSE 91 02/12/2019   BUN 13 02/12/2019   CREATININE 0.96 02/12/2019   BILITOT 0.4 02/12/2019   ALKPHOS 46 02/12/2019   AST 24 02/12/2019   ALT 41 02/12/2019   PROT 6.8 02/12/2019   ALBUMIN 4.3 02/12/2019   CALCIUM 9.5 02/12/2019   ANIONGAP 9 01/18/2017   No results found for: CHOL No results found for: HDL No results found for: LDLCALC No results found for: TRIG No results found for: CHOLHDL Lab Results  Component Value Date   HGBA1C 5.6 11/23/2019   HGBA1C 5.6 11/23/2019   HGBA1C 5.6 (A) 11/23/2019   HGBA1C 5.6 11/23/2019   Assessment & Plan:   1. Encounter to establish care  2. Hospital discharge follow-up  3. Benign paroxysmal positional vertigo, unspecified laterality Stable today.   4. Dizziness Stable today. We will increase dosage of Meclizine and assess at next office visit for effectiveness.  - meclizine (ANTIVERT) 25  MG tablet; Take 1 tablet (25 mg total) by mouth 3 (three) times daily as needed for dizziness.  Dispense: 90 tablet; Refill: 6  5. Dental caries - amoxicillin-clavulanate (AUGMENTIN) 875-125 MG tablet; Take 1 tablet by mouth 2 (two) times  daily for 10 days.  Dispense: 20 tablet; Refill: 0 - ibuprofen (ADVIL) 800 MG tablet; Take 1 tablet (800 mg total) by mouth every 8 (eight) hours as needed.  Dispense: 30 tablet; Refill: 3  6. Dental abscess - amoxicillin-clavulanate (AUGMENTIN) 875-125 MG tablet; Take 1 tablet by mouth 2 (two) times daily for 10 days.  Dispense: 20 tablet; Refill: 0 - ibuprofen (ADVIL) 800 MG tablet; Take 1 tablet (800 mg total) by mouth every 8 (eight) hours as needed.  Dispense: 30 tablet; Refill: 3  7. Tooth pain - amoxicillin-clavulanate (AUGMENTIN) 875-125 MG tablet; Take 1 tablet by mouth 2 (two) times daily for 10 days.  Dispense: 20 tablet; Refill: 0 - ibuprofen (ADVIL) 800 MG tablet; Take 1 tablet (800 mg total) by mouth every 8 (eight) hours as needed.  Dispense: 30 tablet; Refill: 3  8. Essential hypertension He will continue to take medications as prescribed, to decrease high sodium intake, excessive alcohol intake, increase potassium intake, smoking cessation, and increase physical activity of at least 30 minutes of cardio activity daily. He will continue to follow Heart Healthy or DASH diet. - amLODipine (NORVASC) 5 MG tablet; Take 1 tablet (5 mg total) by mouth daily.  Dispense: 30 tablet; Refill: 3  9. Mild intermittent asthma without complication - albuterol (VENTOLIN HFA) 108 (90 Base) MCG/ACT inhaler; Inhale 2 puffs into the lungs every 6 (six) hours as needed for wheezing or shortness of breath.  Dispense: 18 g; Refill: 11  10. Anxiety and depression Stable today.  - hydrOXYzine (ATARAX/VISTARIL) 25 MG tablet; Take 1 tablet (25 mg total) by mouth every 8 (eight) hours as needed. For anxiety  Dispense: 270 tablet; Refill: 3  12. Healthcare maintenance - POC Glucose (CBG) - POC HgB A1c - CBC with Differential - Comprehensive metabolic panel - Lipid Panel - TSH - Vitamin D, 25-hydroxy - Vitamin B12  11. Follow up He will follow up in 1 month.   Meds ordered this encounter    Medications  . amoxicillin-clavulanate (AUGMENTIN) 875-125 MG tablet    Sig: Take 1 tablet by mouth 2 (two) times daily for 10 days.    Dispense:  20 tablet    Refill:  0  . ibuprofen (ADVIL) 800 MG tablet    Sig: Take 1 tablet (800 mg total) by mouth every 8 (eight) hours as needed.    Dispense:  30 tablet    Refill:  3  . albuterol (VENTOLIN HFA) 108 (90 Base) MCG/ACT inhaler    Sig: Inhale 2 puffs into the lungs every 6 (six) hours as needed for wheezing or shortness of breath.    Dispense:  18 g    Refill:  11  . cetirizine (ZYRTEC) 10 MG tablet    Sig: Take 1 tablet (10 mg total) by mouth daily.    Dispense:  90 tablet    Refill:  3  . hydrOXYzine (ATARAX/VISTARIL) 25 MG tablet    Sig: Take 1 tablet (25 mg total) by mouth every 8 (eight) hours as needed. For anxiety    Dispense:  270 tablet    Refill:  3  . amLODipine (NORVASC) 5 MG tablet    Sig: Take 1 tablet (5 mg total) by mouth daily.  Dispense:  30 tablet    Refill:  3  . meclizine (ANTIVERT) 25 MG tablet    Sig: Take 1 tablet (25 mg total) by mouth 3 (three) times daily as needed for dizziness.    Dispense:  90 tablet    Refill:  6  . omeprazole (PRILOSEC) 20 MG capsule    Sig: Take 1 capsule (20 mg total) by mouth daily.    Dispense:  90 capsule    Refill:  3    Orders Placed This Encounter  Procedures  . CBC with Differential  . Comprehensive metabolic panel  . Lipid Panel  . TSH  . Vitamin D, 25-hydroxy  . Vitamin B12  . POC Glucose (CBG)  . POC HgB A1c   Referral Orders  No referral(s) requested today    Raliegh Ip,  MSN, FNP-BC [facility] [address] [place], [place] [address] [phone]    Problem List Items Addressed This Visit      Cardiovascular and Mediastinum   Essential hypertension (Chronic)   Relevant Medications   amLODipine (NORVASC) 5 MG tablet      Respiratory   Mild intermittent asthma without complication   Relevant Medications   albuterol (VENTOLIN HFA) 108 (90 Base) MCG/ACT inhaler     Nervous and Auditory   Benign paroxysmal positional vertigo     Other   Anxiety and depression (Chronic)   Relevant Medications   hydrOXYzine (ATARAX/VISTARIL) 25 MG tablet    Other Visit Diagnoses    Healthcare maintenance    -  Primary   Relevant Orders   POC Glucose (CBG) (Completed)   POC HgB A1c (Completed)   CBC with Differential   Comprehensive metabolic panel   Lipid Panel   TSH   Vitamin D, 25-hydroxy   Vitamin B12   Encounter to establish care       Hospital discharge follow-up       Dizziness       Relevant Medications   meclizine (ANTIVERT) 25 MG tablet   Dental caries       Relevant Medications   amoxicillin-clavulanate (AUGMENTIN) 875-125 MG tablet   ibuprofen (ADVIL) 800 MG tablet   Dental abscess       Relevant Medications   amoxicillin-clavulanate (AUGMENTIN) 875-125 MG tablet   ibuprofen (ADVIL) 800 MG tablet   Tooth pain       Relevant Medications   amoxicillin-clavulanate (AUGMENTIN) 875-125 MG tablet   ibuprofen (ADVIL) 800 MG tablet   Follow up          Meds ordered this encounter  Medications  . amoxicillin-clavulanate (AUGMENTIN) 875-125 MG tablet    Sig: Take 1 tablet by mouth 2 (two) times daily for 10 days.    Dispense:  20 tablet    Refill:  0  . ibuprofen (ADVIL) 800 MG tablet    Sig: Take 1 tablet (800 mg total) by mouth every 8 (eight) hours as needed.    Dispense:  30 tablet    Refill:  3  . albuterol (VENTOLIN HFA) 108 (90 Base) MCG/ACT inhaler    Sig: Inhale 2 puffs into the lungs every 6 (six) hours as needed for wheezing or shortness of breath.    Dispense:  18 g    Refill:  11  . cetirizine (ZYRTEC) 10 MG tablet    Sig: Take 1 tablet (10 mg total) by mouth daily.    Dispense:  90 tablet    Refill:  3  . hydrOXYzine (ATARAX/VISTARIL) 25 MG tablet    Sig: Take 1 tablet (25 mg  total) by mouth every 8 (eight) hours as needed. For anxiety    Dispense:  270 tablet    Refill:  3  . amLODipine (NORVASC) 5 MG tablet    Sig: Take 1 tablet (5 mg total) by mouth daily.    Dispense:  30 tablet    Refill:  3  . meclizine (ANTIVERT) 25 MG tablet    Sig: Take 1 tablet (25 mg total) by mouth 3 (three) times daily as needed for dizziness.    Dispense:  90 tablet    Refill:  6  . omeprazole (PRILOSEC) 20 MG capsule    Sig: Take 1 capsule (20 mg total) by mouth daily.    Dispense:  90 capsule    Refill:  3    Follow-up: Return in about 1 month (around 12/23/2019).    Kallie Locks, FNP

## 2019-11-24 LAB — CBC WITH DIFFERENTIAL/PLATELET
Basophils Absolute: 0.1 10*3/uL (ref 0.0–0.2)
Basos: 1 %
EOS (ABSOLUTE): 0.2 10*3/uL (ref 0.0–0.4)
Eos: 3 %
Hematocrit: 50.2 % (ref 37.5–51.0)
Hemoglobin: 16.9 g/dL (ref 13.0–17.7)
Immature Grans (Abs): 0 10*3/uL (ref 0.0–0.1)
Immature Granulocytes: 0 %
Lymphocytes Absolute: 1.6 10*3/uL (ref 0.7–3.1)
Lymphs: 28 %
MCH: 31.4 pg (ref 26.6–33.0)
MCHC: 33.7 g/dL (ref 31.5–35.7)
MCV: 93 fL (ref 79–97)
Monocytes Absolute: 0.5 10*3/uL (ref 0.1–0.9)
Monocytes: 8 %
Neutrophils Absolute: 3.5 10*3/uL (ref 1.4–7.0)
Neutrophils: 60 %
Platelets: 247 10*3/uL (ref 150–450)
RBC: 5.39 x10E6/uL (ref 4.14–5.80)
RDW: 14.5 % (ref 11.6–15.4)
WBC: 5.8 10*3/uL (ref 3.4–10.8)

## 2019-11-24 LAB — COMPREHENSIVE METABOLIC PANEL
ALT: 49 IU/L — ABNORMAL HIGH (ref 0–44)
AST: 27 IU/L (ref 0–40)
Albumin/Globulin Ratio: 1.5 (ref 1.2–2.2)
Albumin: 4.6 g/dL (ref 4.0–5.0)
Alkaline Phosphatase: 45 IU/L (ref 44–121)
BUN/Creatinine Ratio: 17 (ref 9–20)
BUN: 17 mg/dL (ref 6–20)
Bilirubin Total: 0.4 mg/dL (ref 0.0–1.2)
CO2: 25 mmol/L (ref 20–29)
Calcium: 9.9 mg/dL (ref 8.7–10.2)
Chloride: 98 mmol/L (ref 96–106)
Creatinine, Ser: 1.02 mg/dL (ref 0.76–1.27)
GFR calc Af Amer: 112 mL/min/{1.73_m2} (ref >59)
GFR calc non Af Amer: 97 mL/min/{1.73_m2} (ref >59)
Globulin, Total: 3.1 g/dL (ref 1.5–4.5)
Glucose: 106 mg/dL — ABNORMAL HIGH (ref 65–99)
Potassium: 3.4 mmol/L — ABNORMAL LOW (ref 3.5–5.2)
Sodium: 138 mmol/L (ref 134–144)
Total Protein: 7.7 g/dL (ref 6.0–8.5)

## 2019-11-24 LAB — TSH: TSH: 2.72 u[IU]/mL (ref 0.450–4.500)

## 2019-11-24 LAB — LIPID PANEL
Chol/HDL Ratio: 5.6 ratio — ABNORMAL HIGH (ref 0.0–5.0)
Cholesterol, Total: 217 mg/dL — ABNORMAL HIGH (ref 100–199)
HDL: 39 mg/dL — ABNORMAL LOW (ref >39)
LDL Chol Calc (NIH): 141 mg/dL — ABNORMAL HIGH (ref 0–99)
Triglycerides: 207 mg/dL — ABNORMAL HIGH (ref 0–149)
VLDL Cholesterol Cal: 37 mg/dL (ref 5–40)

## 2019-11-24 LAB — VITAMIN B12: Vitamin B-12: 381 pg/mL (ref 232–1245)

## 2019-11-24 LAB — VITAMIN D 25 HYDROXY (VIT D DEFICIENCY, FRACTURES): Vit D, 25-Hydroxy: 22.7 ng/mL — ABNORMAL LOW (ref 30.0–100.0)

## 2019-11-26 ENCOUNTER — Encounter: Payer: Self-pay | Admitting: Family Medicine

## 2019-12-02 NOTE — Progress Notes (Signed)
Pt as called to discuss his lab results. Pt state he understood and will keep his f/u appt.

## 2019-12-29 ENCOUNTER — Other Ambulatory Visit: Payer: Self-pay

## 2019-12-29 ENCOUNTER — Ambulatory Visit (INDEPENDENT_AMBULATORY_CARE_PROVIDER_SITE_OTHER): Payer: 59 | Admitting: Family Medicine

## 2019-12-29 VITALS — BP 143/96 | HR 66 | Temp 98.6°F | Resp 20 | Ht 71.0 in | Wt 274.6 lb

## 2019-12-29 DIAGNOSIS — S29011A Strain of muscle and tendon of front wall of thorax, initial encounter: Secondary | ICD-10-CM

## 2019-12-29 DIAGNOSIS — I10 Essential (primary) hypertension: Secondary | ICD-10-CM | POA: Diagnosis not present

## 2019-12-29 DIAGNOSIS — Z09 Encounter for follow-up examination after completed treatment for conditions other than malignant neoplasm: Secondary | ICD-10-CM

## 2019-12-29 DIAGNOSIS — Z202 Contact with and (suspected) exposure to infections with a predominantly sexual mode of transmission: Secondary | ICD-10-CM | POA: Diagnosis not present

## 2019-12-29 MED ORDER — LISINOPRIL-HYDROCHLOROTHIAZIDE 10-12.5 MG PO TABS: 1.0000 | ORAL_TABLET | Freq: Every day | ORAL | 11 refills | Status: DC

## 2019-12-29 MED ORDER — CYCLOBENZAPRINE HCL 10 MG PO TABS: 10.0000 mg | ORAL_TABLET | Freq: Three times a day (TID) | ORAL | 3 refills | Status: DC | PRN

## 2019-12-29 NOTE — Patient Instructions (Addendum)
DASH Eating Plan DASH stands for "Dietary Approaches to Stop Hypertension." The DASH eating plan is a healthy eating plan that has been shown to reduce high blood pressure (hypertension). It may also reduce your risk for type 2 diabetes, heart disease, and stroke. The DASH eating plan may also help with weight loss. What are tips for following this plan?  General guidelines  Avoid eating more than 2,300 mg (milligrams) of salt (sodium) a day. If you have hypertension, you may need to reduce your sodium intake to 1,500 mg a day.  Limit alcohol intake to no more than 1 drink a day for nonpregnant women and 2 drinks a day for men. One drink equals 12 oz of beer, 5 oz of wine, or 1 oz of hard liquor.  Work with your health care provider to maintain a healthy body weight or to lose weight. Ask what an ideal weight is for you.  Get at least 30 minutes of exercise that causes your heart to beat faster (aerobic exercise) most days of the week. Activities may include walking, swimming, or biking.  Work with your health care provider or diet and nutrition specialist (dietitian) to adjust your eating plan to your individual calorie needs. Reading food labels   Check food labels for the amount of sodium per serving. Choose foods with less than 5 percent of the Daily Value of sodium. Generally, foods with less than 300 mg of sodium per serving fit into this eating plan.  To find whole grains, look for the word "whole" as the first word in the ingredient list. Shopping  Buy products labeled as "low-sodium" or "no salt added."  Buy fresh foods. Avoid canned foods and premade or frozen meals. Cooking  Avoid adding salt when cooking. Use salt-free seasonings or herbs instead of table salt or sea salt. Check with your health care provider or pharmacist before using salt substitutes.  Do not fry foods. Cook foods using healthy methods such as baking, boiling, grilling, and broiling instead.  Cook with  heart-healthy oils, such as olive, canola, soybean, or sunflower oil. Meal planning  Eat a balanced diet that includes: ? 5 or more servings of fruits and vegetables each day. At each meal, try to fill half of your plate with fruits and vegetables. ? Up to 6-8 servings of whole grains each day. ? Less than 6 oz of lean meat, poultry, or fish each day. A 3-oz serving of meat is about the same size as a deck of cards. One egg equals 1 oz. ? 2 servings of low-fat dairy each day. ? A serving of nuts, seeds, or beans 5 times each week. ? Heart-healthy fats. Healthy fats called Omega-3 fatty acids are found in foods such as flaxseeds and coldwater fish, like sardines, salmon, and mackerel.  Limit how much you eat of the following: ? Canned or prepackaged foods. ? Food that is high in trans fat, such as fried foods. ? Food that is high in saturated fat, such as fatty meat. ? Sweets, desserts, sugary drinks, and other foods with added sugar. ? Full-fat dairy products.  Do not salt foods before eating.  Try to eat at least 2 vegetarian meals each week.  Eat more home-cooked food and less restaurant, buffet, and fast food.  When eating at a restaurant, ask that your food be prepared with less salt or no salt, if possible. What foods are recommended? The items listed may not be a complete list. Talk with your dietitian about   what dietary choices are best for you. Grains Whole-grain or whole-wheat bread. Whole-grain or whole-wheat pasta. Brown rice. Oatmeal. Quinoa. Bulgur. Whole-grain and low-sodium cereals. Pita bread. Low-fat, low-sodium crackers. Whole-wheat flour tortillas. Vegetables Fresh or frozen vegetables (raw, steamed, roasted, or grilled). Low-sodium or reduced-sodium tomato and vegetable juice. Low-sodium or reduced-sodium tomato sauce and tomato paste. Low-sodium or reduced-sodium canned vegetables. Fruits All fresh, dried, or frozen fruit. Canned fruit in natural juice (without  added sugar). Meat and other protein foods Skinless chicken or turkey. Ground chicken or turkey. Pork with fat trimmed off. Fish and seafood. Egg whites. Dried beans, peas, or lentils. Unsalted nuts, nut butters, and seeds. Unsalted canned beans. Lean cuts of beef with fat trimmed off. Low-sodium, lean deli meat. Dairy Low-fat (1%) or fat-free (skim) milk. Fat-free, low-fat, or reduced-fat cheeses. Nonfat, low-sodium ricotta or cottage cheese. Low-fat or nonfat yogurt. Low-fat, low-sodium cheese. Fats and oils Soft margarine without trans fats. Vegetable oil. Low-fat, reduced-fat, or light mayonnaise and salad dressings (reduced-sodium). Canola, safflower, olive, soybean, and sunflower oils. Avocado. Seasoning and other foods Herbs. Spices. Seasoning mixes without salt. Unsalted popcorn and pretzels. Fat-free sweets. What foods are not recommended? The items listed may not be a complete list. Talk with your dietitian about what dietary choices are best for you. Grains Baked goods made with fat, such as croissants, muffins, or some breads. Dry pasta or rice meal packs. Vegetables Creamed or fried vegetables. Vegetables in a cheese sauce. Regular canned vegetables (not low-sodium or reduced-sodium). Regular canned tomato sauce and paste (not low-sodium or reduced-sodium). Regular tomato and vegetable juice (not low-sodium or reduced-sodium). Pickles. Olives. Fruits Canned fruit in a light or heavy syrup. Fried fruit. Fruit in cream or butter sauce. Meat and other protein foods Fatty cuts of meat. Ribs. Fried meat. Bacon. Sausage. Bologna and other processed lunch meats. Salami. Fatback. Hotdogs. Bratwurst. Salted nuts and seeds. Canned beans with added salt. Canned or smoked fish. Whole eggs or egg yolks. Chicken or turkey with skin. Dairy Whole or 2% milk, cream, and half-and-half. Whole or full-fat cream cheese. Whole-fat or sweetened yogurt. Full-fat cheese. Nondairy creamers. Whipped toppings.  Processed cheese and cheese spreads. Fats and oils Butter. Stick margarine. Lard. Shortening. Ghee. Bacon fat. Tropical oils, such as coconut, palm kernel, or palm oil. Seasoning and other foods Salted popcorn and pretzels. Onion salt, garlic salt, seasoned salt, table salt, and sea salt. Worcestershire sauce. Tartar sauce. Barbecue sauce. Teriyaki sauce. Soy sauce, including reduced-sodium. Steak sauce. Canned and packaged gravies. Fish sauce. Oyster sauce. Cocktail sauce. Horseradish that you find on the shelf. Ketchup. Mustard. Meat flavorings and tenderizers. Bouillon cubes. Hot sauce and Tabasco sauce. Premade or packaged marinades. Premade or packaged taco seasonings. Relishes. Regular salad dressings. Where to find more information:  National Heart, Lung, and Blood Institute: www.nhlbi.nih.gov  American Heart Association: www.heart.org Summary  The DASH eating plan is a healthy eating plan that has been shown to reduce high blood pressure (hypertension). It may also reduce your risk for type 2 diabetes, heart disease, and stroke.  With the DASH eating plan, you should limit salt (sodium) intake to 2,300 mg a day. If you have hypertension, you may need to reduce your sodium intake to 1,500 mg a day.  When on the DASH eating plan, aim to eat more fresh fruits and vegetables, whole grains, lean proteins, low-fat dairy, and heart-healthy fats.  Work with your health care provider or diet and nutrition specialist (dietitian) to adjust your eating plan to your   individual calorie needs. This information is not intended to replace advice given to you by your health care provider. Make sure you discuss any questions you have with your health care provider. Document Revised: 01/25/2017 Document Reviewed: 02/06/2016 Elsevier Patient Education  2020 [employer]. Hypertension, Adult Hypertension is another name for high blood pressure. High blood pressure forces your heart to work harder to pump  blood. This can cause problems over time. There are two numbers in a blood pressure reading. There is a top number (systolic) over a bottom number (diastolic). It is best to have a blood pressure that is below 120/80. Healthy choices can help lower your blood pressure, or you may need medicine to help lower it. What are the causes? The cause of this condition is not known. Some conditions may be related to high blood pressure. What increases the risk?  Smoking.  Having type 2 diabetes mellitus, high cholesterol, or both.  Not getting enough exercise or physical activity.  Being overweight.  Having too much fat, sugar, calories, or salt (sodium) in your diet.  Drinking too much alcohol.  Having long-term (chronic) kidney disease.  Having a family history of high blood pressure.  Age. Risk increases with age.  Race. You may be at higher risk if you are African American.  Gender. Men are at higher risk than women before age 70. After age 35, women are at higher risk than men.  Having obstructive sleep apnea.  Stress. What are the signs or symptoms?  High blood pressure may not cause symptoms. Very high blood pressure (hypertensive crisis) may cause: ? Headache. ? Feelings of worry or nervousness (anxiety). ? Shortness of breath. ? Nosebleed. ? A feeling of being sick to your stomach (nausea). ? Throwing up (vomiting). ? Changes in how you see. ? Very bad chest pain. ? Seizures. How is this treated?  This condition is treated by making healthy lifestyle changes, such as: ? Eating healthy foods. ? Exercising more. ? Drinking less alcohol.  Your health care provider may prescribe medicine if lifestyle changes are not enough to get your blood pressure under control, and if: ? Your top number is above 130. ? Your bottom number is above 80.  Your personal target blood pressure may vary. Follow these instructions at home: Eating and drinking   If told, follow the DASH  eating plan. To follow this plan: ? Fill one half of your plate at each meal with fruits and vegetables. ? Fill one fourth of your plate at each meal with whole grains. Whole grains include whole-wheat pasta, brown rice, and whole-grain bread. ? Eat or drink low-fat dairy products, such as skim milk or low-fat yogurt. ? Fill one fourth of your plate at each meal with low-fat (lean) proteins. Low-fat proteins include fish, chicken without skin, eggs, beans, and tofu. ? Avoid fatty meat, cured and processed meat, or chicken with skin. ? Avoid pre-made or processed food.  Eat less than 1,500 mg of salt each day.  Do not drink alcohol if: ? Your doctor tells you not to drink. ? You are pregnant, may be pregnant, or are planning to become pregnant.  If you drink alcohol: ? Limit how much you use to:  0-1 drink a day for women.  0-2 drinks a day for men. ? Be aware of how much alcohol is in your drink. In the [place] one drink equals one 12 oz bottle of beer (355 mL), one 5 oz glass of wine (148 mL),  or one 1 oz glass of hard liquor (44 mL). Lifestyle   Work with your doctor to stay at a healthy weight or to lose weight. Ask your doctor what the best weight is for you.  Get at least 30 minutes of exercise most days of the week. This may include walking, swimming, or biking.  Get at least 30 minutes of exercise that strengthens your muscles (resistance exercise) at least 3 days a week. This may include lifting weights or doing Pilates.  Do not use any products that contain nicotine or tobacco, such as cigarettes, e-cigarettes, and chewing tobacco. If you need help quitting, ask your doctor.  Check your blood pressure at home as told by your doctor.  Keep all follow-up visits as told by your doctor. This is important. Medicines  Take over-the-counter and prescription medicines only as told by your doctor. Follow directions carefully.  Do not skip doses of blood pressure medicine. The  medicine does not work as well if you skip doses. Skipping doses also puts you at risk for problems.  Ask your doctor about side effects or reactions to medicines that you should watch for. Contact a doctor if you:  Think you are having a reaction to the medicine you are taking.  Have headaches that keep coming back (recurring).  Feel dizzy.  Have swelling in your ankles.  Have trouble with your vision. Get help right away if you:  Get a very bad headache.  Start to feel mixed up (confused).  Feel weak or numb.  Feel faint.  Have very bad pain in your: ? Chest. ? Belly (abdomen).  Throw up more than once.  Have trouble breathing. Summary  Hypertension is another name for high blood pressure.  High blood pressure forces your heart to work harder to pump blood.  For most people, a normal blood pressure is less than 120/80.  Making healthy choices can help lower blood pressure. If your blood pressure does not get lower with healthy choices, you may need to take medicine. This information is not intended to replace advice given to you by your health care provider. Make sure you discuss any questions you have with your health care provider. Document Revised: 10/23/2017 Document Reviewed: 10/23/2017 Elsevier Patient Education  2020 [employer]. Hydrochlorothiazide, HCTZ; Lisinopril Tablets What is this medicine? HYDROCHLOROTHIAZIDE; LISINOPRIL (hye droe klor oh THYE a zide; lyse IN oh pril) is a combination of a diuretic and an ACE inhibitor. It treats high blood pressure. This medicine may be used for other purposes; ask your health care provider or pharmacist if you have questions. COMMON BRAND NAME(S): Prinzide, Zestoretic What should I tell my health care provider before I take this medicine? They need to know if you have any of these conditions:  bone marrow disease  decreased urine  diabetes  heart or blood vessel disease  if you are on a special diet like  a low salt diet  immune system problems, like lupus  kidney disease  liver disease  previous swelling of the tongue, face, or lips with difficulty breathing, difficulty swallowing, hoarseness, or tightening of the throat  recent heart attack or stroke  an unusual or allergic reaction to lisinopril, hydrochlorothiazide, sulfa drugs, other medicines, insect venom, foods, dyes, or preservatives  pregnant or trying to get pregnant  breast-feeding How should I use this medicine? Take this drug by mouth. Take it as directed on the prescription label at the same time every day. You can take it with  or without food. If it upsets your stomach, take it with food. Keep taking it unless your health care provider tells you to stop. Talk to your health care provider about the use of this drug in children. Special care may be needed. Overdosage: If you think you have taken too much of this medicine contact a poison control center or emergency room at once. NOTE: This medicine is only for you. Do not share this medicine with others. What if I miss a dose? If you miss a dose, take it as soon as you can. If it is almost time for your next dose, take only that dose. Do not take double or extra doses. What may interact with this medicine? Do not take this medication with any of the following medications:  sacubitril; valsartan This medicine may also interact with the following:  barbiturates like phenobarbital  blood pressure medicines  corticosteroids like prednisone  diabetic medications  diuretics, especially triamterene, spironolactone or amiloride  lithium  NSAIDs, medicines for pain and inflammation, like ibuprofen or naproxen  potassium salts or potassium supplements  prescription pain medicines  skeletal muscle relaxants like tubocurarine  some cholesterol lowering medications like cholestyramine or colestipol This list may not describe all possible interactions. Give your  health care provider a list of all the medicines, herbs, non-prescription drugs, or dietary supplements you use. Also tell them if you smoke, drink alcohol, or use illegal drugs. Some items may interact with your medicine. What should I watch for while using this medicine? Visit your doctor or health care professional for regular checks on your progress. Check your blood pressure as directed. Ask your doctor or health care professional what your blood pressure should be and when you should contact him or her. Call your doctor or health care professional if you notice an irregular or fast heartbeat. You must not get dehydrated. Ask your doctor or health care professional how much fluid you need to drink a day. Check with him or her if you get an attack of severe diarrhea, nausea and vomiting, or if you sweat a lot. The loss of too much body fluid can make it dangerous for you to take this medicine. Women should inform their doctor if they wish to become pregnant or think they might be pregnant. There is a potential for serious side effects to an unborn child. Talk to your health care professional or pharmacist for more information. You may get drowsy or dizzy. Do not drive, use machinery, or do anything that needs mental alertness until you know how this drug affects you. Do not stand or sit up quickly, especially if you are an older patient. This reduces the risk of dizzy or fainting spells. Alcohol can make you more drowsy and dizzy. Avoid alcoholic drinks. This medicine may increase blood sugar. Ask your healthcare provider if changes in diet or medicines are needed if you have diabetes. Avoid salt substitutes unless you are told otherwise by your doctor or health care professional. Talk to your health care professional about your risk of skin cancer. You may be more at risk for skin cancer if you take this medicine. This medicine can make you more sensitive to the sun. Keep out of the sun. If you cannot  avoid being in the sun, wear protective clothing and use sunscreen. Do not use sun lamps or tanning beds/booths. Do not treat yourself for coughs, colds, or pain while you are taking this medicine without asking your doctor or health care professional  for advice. Some ingredients may increase your blood pressure. What side effects may I notice from receiving this medicine? Side effects that you should report to your doctor or health care professional as soon as possible:  changes in vision  confusion, dizziness, light headedness or fainting spells  decreased amount of urine passed  difficulty breathing or swallowing, hoarseness, or tightening of the throat  eye pain  fast or irregular heart beat, palpitations, or chest pain  muscle cramps  nausea and vomiting  persistent dry cough  redness, blistering, peeling or loosening of the skin, including inside the mouth   signs and symptoms of high blood sugar such as being more thirsty or hungry or having to urinate more than normal. You may also feel very tired or have blurry vision.  stomach pain  swelling of your face, lips, tongue, hands, or feet  unusual rash, bleeding or bruising, or pinpoint red spots on the skin  worsened gout pain  yellowing of the eyes or skin Side effects that usually do not require medical attention (report to your doctor or health care professional if they continue or are bothersome):  change in sex drive or performance  cough  headache This list may not describe all possible side effects. Call your doctor for medical advice about side effects. You may report side effects to FDA at 1-800-FDA-1088. Where should I keep my medicine? Keep out of the reach of children and pets. Store at room temperature between 20 and 25 degrees C (68 and 77 degrees F). Protect from light and moisture. Keep the container tightly closed. Throw away any unused drug after the expiration date. NOTE: This sheet is a  summary. It may not cover all possible information. If you have questions about this medicine, talk to your doctor, pharmacist, or health care provider.  2020 Elsevier/Gold Standard (2018-10-20 15:19:46) Cyclobenzaprine tablets What is this medicine? CYCLOBENZAPRINE (sye kloe BEN za preen) is a muscle relaxer. It is used to treat muscle pain, spasms, and stiffness. This medicine may be used for other purposes; ask your health care provider or pharmacist if you have questions. COMMON BRAND NAME(S): Fexmid, Flexeril What should I tell my health care provider before I take this medicine? They need to know if you have any of these conditions:  heart disease, irregular heartbeat, or previous heart attack  liver disease  thyroid problem  an unusual or allergic reaction to cyclobenzaprine, tricyclic antidepressants, lactose, other medicines, foods, dyes, or preservatives  pregnant or trying to get pregnant  breast-feeding How should I use this medicine? Take this medicine by mouth with a glass of water. Follow the directions on the prescription label. If this medicine upsets your stomach, take it with food or milk. Take your medicine at regular intervals. Do not take it more often than directed. Talk to your pediatrician regarding the use of this medicine in children. Special care may be needed. Overdosage: If you think you have taken too much of this medicine contact a poison control center or emergency room at once. NOTE: This medicine is only for you. Do not share this medicine with others. What if I miss a dose? If you miss a dose, take it as soon as you can. If it is almost time for your next dose, take only that dose. Do not take double or extra doses. What may interact with this medicine? Do not take this medicine with any of the following medications:  MAOIs like Carbex, Eldepryl, Marplan, Nardil, and Parnate  narcotic medicines for cough  safinamide This medicine may also  interact with the following medications:  alcohol  bupropion  antihistamines for allergy, cough and cold  certain medicines for anxiety or sleep  certain medicines for bladder problems like oxybutynin, tolterodine  certain medicines for depression like amitriptyline, fluoxetine, sertraline  certain medicines for Parkinson's disease like benztropine, trihexyphenidyl  certain medicines for seizures like phenobarbital, primidone  certain medicines for stomach problems like dicyclomine, hyoscyamine  certain medicines for travel sickness like scopolamine  general anesthetics like halothane, isoflurane, methoxyflurane, propofol  ipratropium  local anesthetics like lidocaine, pramoxine, tetracaine  medicines that relax muscles for surgery  narcotic medicines for pain  phenothiazines like chlorpromazine, mesoridazine, prochlorperazine, thioridazine  verapamil This list may not describe all possible interactions. Give your health care provider a list of all the medicines, herbs, non-prescription drugs, or dietary supplements you use. Also tell them if you smoke, drink alcohol, or use illegal drugs. Some items may interact with your medicine. What should I watch for while using this medicine? Tell your doctor or health care professional if your symptoms do not start to get better or if they get worse. You may get drowsy or dizzy. Do not drive, use machinery, or do anything that needs mental alertness until you know how this medicine affects you. Do not stand or sit up quickly, especially if you are an older patient. This reduces the risk of dizzy or fainting spells. Alcohol may interfere with the effect of this medicine. Avoid alcoholic drinks. If you are taking another medicine that also causes drowsiness, you may have more side effects. Give your health care provider a list of all medicines you use. Your doctor will tell you how much medicine to take. Do not take more medicine than  directed. Call emergency for help if you have problems breathing or unusual sleepiness. Your mouth may get dry. Chewing sugarless gum or sucking hard candy, and drinking plenty of water may help. Contact your doctor if the problem does not go away or is severe. What side effects may I notice from receiving this medicine? Side effects that you should report to your doctor or health care professional as soon as possible:  allergic reactions like skin rash, itching or hives, swelling of the face, lips, or tongue  breathing problems  chest pain  fast, irregular heartbeat  hallucinations  seizures  unusually weak or tired Side effects that usually do not require medical attention (report to your doctor or health care professional if they continue or are bothersome):  headache  nausea, vomiting This list may not describe all possible side effects. Call your doctor for medical advice about side effects. You may report side effects to FDA at 1-800-FDA-1088. Where should I keep my medicine? Keep out of the reach of children. Store at room temperature between 15 and 30 degrees C (59 and 86 degrees F). Keep container tightly closed. Throw away any unused medicine after the expiration date. NOTE: This sheet is a summary. It may not cover all possible information. If you have questions about this medicine, talk to your doctor, pharmacist, or health care provider.  2020 Elsevier/Gold Standard (2018-01-15 12:49:26)

## 2019-12-29 NOTE — Progress Notes (Signed)
[facility]    Established Patient Office Visit  Subjective:  Patient ID: Walter Howell, male    DOB: August 27, 1985  Age: 34 y.o. MRN: [id number]  CC: No chief complaint on file.   HPI Walter Howell is a 34 year old male who presents for Follow Up today.   Patient Active Problem List   Diagnosis Date Noted  . Mild intermittent asthma without complication 09/04/2019  . Obesity (BMI 35.0-39.9 without comorbidity) 09/04/2019  . Benign paroxysmal positional vertigo 07/23/2017  . TMJ (temporomandibular joint disorder) 07/23/2017  . Gastroesophageal reflux disease without esophagitis 07/23/2017  . History of herniated intervertebral disc 12/25/2016  . Palpitations 12/25/2016  . Anxiety and depression 12/06/2015  . Chronic bilateral low back pain without sciatica 12/06/2015  . Essential hypertension 07/08/2013   Current Status: Since his last office visit, he is doing well with no complaints. He has began a new job and has suffered muscle strain in upper chest since then. He denies visual changes, chest pain, cough, shortness of breath, heart palpitations, and falls. He has occasional headaches and dizziness with position changes. Denies severe headaches, confusion, seizures, double vision, and blurred vision, nausea and vomiting. He denies fevers, chills, fatigue, recent infections, weight loss, and night sweats. Denies GI problems such as diarrhea, and constipation. He has no reports of blood in stools, dysuria and hematuria. No depression or anxiety reported today. He is taking all medications as prescribed. He denies pain today.   Past Medical History:  Diagnosis Date  . Anxiety   . Asthma   . Dizziness   . GERD (gastroesophageal reflux disease)   . Hypertension   . Seasonal allergies   . Vertigo   . Vitamin D deficiency     No past surgical history on file.  Family History  Problem Relation Age of Onset  .  Diabetes Maternal Grandmother   . Hypertension Maternal Grandmother   . Diabetes Maternal Grandfather   . Diabetes Paternal Grandmother   . Heart disease Paternal Grandmother   . Diabetes Paternal Grandfather     Social History   Socioeconomic History  . Marital status: Significant Other    Spouse name: Not on file  . Number of children: Not on file  . Years of education: Not on file  . Highest education level: Not on file  Occupational History  . Not on file  Tobacco Use  . Smoking status: Former Games developer  . Smokeless tobacco: Never Used  Vaping Use  . Vaping Use: Never used  Substance and Sexual Activity  . Alcohol use: No  . Drug use: No  . Sexual activity: Not on file  Other Topics Concern  . Not on file  Social History Narrative  . Not on file   Social Determinants of Health   Financial Resource Strain:   . Difficulty of Paying Living Expenses: Not on file  Food Insecurity:   . Worried About Programme researcher, broadcasting/film/video in the Last Year: Not on file  . Ran Out of Food in the Last Year: Not on file  Transportation Needs:   . Lack of Transportation (Medical): Not on file  . Lack of Transportation (Non-Medical): Not on file  Physical Activity:   . Days of Exercise per Week: Not on file  . Minutes of Exercise per Session: Not on file  Stress:   . Feeling of Stress : Not on file  Social Connections:   . Frequency of  Communication with Friends and Family: Not on file  . Frequency of Social Gatherings with Friends and Family: Not on file  . Attends Religious Services: Not on file  . Active Member of Clubs or Organizations: Not on file  . Attends Banker Meetings: Not on file  . Marital Status: Not on file  Intimate Partner Violence:   . Fear of Current or Ex-Partner: Not on file  . Emotionally Abused: Not on file  . Physically Abused: Not on file  . Sexually Abused: Not on file    Outpatient Medications Prior to Visit  Medication Sig Dispense Refill  .  acetaminophen (TYLENOL) 500 MG tablet Take 1,000 mg by mouth every 6 (six) hours as needed for mild pain, moderate pain, fever or headache.    . albuterol (VENTOLIN HFA) 108 (90 Base) MCG/ACT inhaler Inhale 2 puffs into the lungs every 6 (six) hours as needed for wheezing or shortness of breath. 18 g 11  . busPIRone (BUSPAR) 15 MG tablet Take 1 tablet (15 mg total) by mouth 2 (two) times daily. 60 tablet 3  . cetirizine (ZYRTEC) 10 MG tablet Take 1 tablet (10 mg total) by mouth daily. 90 tablet 3  . ibuprofen (ADVIL) 800 MG tablet Take 1 tablet (800 mg total) by mouth every 8 (eight) hours as needed. 30 tablet 3  . meclizine (ANTIVERT) 25 MG tablet Take 1 tablet (25 mg total) by mouth 3 (three) times daily as needed for dizziness. 90 tablet 6  . Multiple Vitamin (MULTIVITAMIN WITH MINERALS) TABS tablet Take 1 tablet by mouth daily.    Marland Kitchen omeprazole (PRILOSEC) 20 MG capsule Take 1 capsule (20 mg total) by mouth daily. 90 capsule 3  . Vitamin D, Ergocalciferol, (DRISDOL) 1.25 MG (50000 UT) CAPS capsule Take 1 capsule (50,000 Units total) by mouth every 7 (seven) days. 16 capsule 0  . fluticasone (FLONASE) 50 MCG/ACT nasal spray Place 1-2 sprays into both nostrils daily for 7 days. 1 g 0  . amLODipine (NORVASC) 5 MG tablet Take 1 tablet (5 mg total) by mouth daily. (Patient not taking: Reported on 12/29/2019) 30 tablet 3  . chlorthalidone (HYGROTON) 25 MG tablet Take 1 tablet (25 mg total) by mouth daily. (Patient not taking: Reported on 12/29/2019) 30 tablet 6  . hydrOXYzine (ATARAX/VISTARIL) 25 MG tablet Take 1 tablet (25 mg total) by mouth every 8 (eight) hours as needed. For anxiety (Patient not taking: Reported on 12/29/2019) 270 tablet 3   No facility-administered medications prior to visit.    Allergies  Allergen Reactions  . Prednisone     "makes him feel weird"    ROS Review of Systems  Constitutional: Negative.   HENT: Negative.   Eyes: Negative.   Respiratory: Negative.    Cardiovascular: Negative.   Gastrointestinal: Negative.   Endocrine: Negative.   Genitourinary: Negative.   Musculoskeletal: Positive for arthralgias (generalized ).  Skin: Negative.   Allergic/Immunologic: Negative.   Neurological: Positive for dizziness (occasional ) and headaches (occasional ).  Hematological: Negative.   Psychiatric/Behavioral: Negative.       Objective:    Physical Exam Vitals and nursing note reviewed.  Constitutional:      Appearance: Normal appearance.  HENT:     Head: Normocephalic and atraumatic.     Nose: Nose normal.     Mouth/Throat:     Mouth: Mucous membranes are moist.     Pharynx: Oropharynx is clear.  Cardiovascular:     Rate and Rhythm: Normal rate and  regular rhythm.     Pulses: Normal pulses.     Heart sounds: Normal heart sounds.  Pulmonary:     Effort: Pulmonary effort is normal.     Breath sounds: Normal breath sounds.  Abdominal:     General: Bowel sounds are normal. There is distension (obese).     Palpations: Abdomen is soft.  Musculoskeletal:        General: Normal range of motion.     Cervical back: Normal range of motion.  Skin:    General: Skin is warm and dry.  Neurological:     General: No focal deficit present.     Mental Status: He is alert and oriented to person, place, and time.  Psychiatric:        Mood and Affect: Mood normal.        Behavior: Behavior normal.        Thought Content: Thought content normal.     BP (!) 143/96   Pulse 66   Temp 98.6 F (37 C)   Resp 20   Ht 5\' 11"  (1.803 m)   Wt 274 lb 9.6 oz (124.6 kg)   SpO2 100%   BMI 38.30 kg/m  Wt Readings from Last 3 Encounters:  12/29/19 274 lb 9.6 oz (124.6 kg)  11/23/19 263 lb 3.2 oz (119.4 kg)  09/04/19 262 lb 6.4 oz (119 kg)     Health Maintenance Due  Topic Date Due  . Hepatitis C Screening  Never done  . COVID-19 Vaccine (1) Never done  . TETANUS/TDAP  Never done  . INFLUENZA VACCINE  Never done    There are no preventive  care reminders to display for this patient.  Lab Results  Component Value Date   TSH 2.720 11/23/2019   Lab Results  Component Value Date   WBC 5.8 11/23/2019   HGB 16.9 11/23/2019   HCT 50.2 11/23/2019   MCV 93 11/23/2019   PLT 247 11/23/2019   Lab Results  Component Value Date   NA 138 11/23/2019   K 3.4 (L) 11/23/2019   CO2 25 11/23/2019   GLUCOSE 106 (H) 11/23/2019   BUN 17 11/23/2019   CREATININE 1.02 11/23/2019   BILITOT 0.4 11/23/2019   ALKPHOS 45 11/23/2019   AST 27 11/23/2019   ALT 49 (H) 11/23/2019   PROT 7.7 11/23/2019   ALBUMIN 4.6 11/23/2019   CALCIUM 9.9 11/23/2019   ANIONGAP 9 01/18/2017   Lab Results  Component Value Date   CHOL 217 (H) 11/23/2019   Lab Results  Component Value Date   HDL 39 (L) 11/23/2019   Lab Results  Component Value Date   LDLCALC 141 (H) 11/23/2019   Lab Results  Component Value Date   TRIG 207 (H) 11/23/2019   Lab Results  Component Value Date   CHOLHDL 5.6 (H) 11/23/2019   Lab Results  Component Value Date   HGBA1C 5.6 11/23/2019   HGBA1C 5.6 11/23/2019   HGBA1C 5.6 (A) 11/23/2019   HGBA1C 5.6 11/23/2019      Assessment & Plan:   1. Muscle strain of anterior chest wall - cyclobenzaprine (FLEXERIL) 10 MG tablet; Take 1 tablet (10 mg total) by mouth 3 (three) times daily as needed for muscle spasms.  Dispense: 30 tablet; Refill: 3  2. Essential hypertension The current medical regimen is effective; blood pressure is stable today; continue present plan and medications as prescribed. He will continue to take medications as prescribed, to decrease high sodium intake, excessive alcohol intake,  increase potassium intake, smoking cessation, and increase physical activity of at least 30 minutes of cardio activity daily. He will continue to follow Heart Healthy or DASH diet. - lisinopril-hydrochlorothiazide (ZESTORETIC) 10-12.5 MG tablet; Take 1 tablet by mouth daily.  Dispense: 30 tablet; Refill: 11  3. Possible  exposure to STD - Chlamydia/Gonococcus/Trichomonas, NAA  4. Follow up He will follow up in 3 months.   Meds ordered this encounter  Medications  . cyclobenzaprine (FLEXERIL) 10 MG tablet    Sig: Take 1 tablet (10 mg total) by mouth 3 (three) times daily as needed for muscle spasms.    Dispense:  30 tablet    Refill:  3  . lisinopril-hydrochlorothiazide (ZESTORETIC) 10-12.5 MG tablet    Sig: Take 1 tablet by mouth daily.    Dispense:  30 tablet    Refill:  11    Orders Placed This Encounter  Procedures  . Chlamydia/Gonococcus/Trichomonas, NAA    Referral Orders  No referral(s) requested today    Raliegh Ip,  MSN, FNP-BC [facility] [address] [place], [place] [address] [phone] fax  Problem List Items Addressed This Visit      Cardiovascular and Mediastinum   Essential hypertension (Chronic)   Relevant Medications   lisinopril-hydrochlorothiazide (ZESTORETIC) 10-12.5 MG tablet    Other Visit Diagnoses    Muscle strain of anterior chest wall    -  Primary   Relevant Medications   cyclobenzaprine (FLEXERIL) 10 MG tablet   Possible exposure to STD       Relevant Orders   Chlamydia/Gonococcus/Trichomonas, NAA (Completed)   Follow up          Meds ordered this encounter  Medications  . cyclobenzaprine (FLEXERIL) 10 MG tablet    Sig: Take 1 tablet (10 mg total) by mouth 3 (three) times daily as needed for muscle spasms.    Dispense:  30 tablet    Refill:  3  . lisinopril-hydrochlorothiazide (ZESTORETIC) 10-12.5 MG tablet    Sig: Take 1 tablet by mouth daily.    Dispense:  30 tablet    Refill:  11    Follow-up: Return in about 3 months (around 03/30/2020).    Kallie Locks, FNP

## 2020-01-02 LAB — CHLAMYDIA/GONOCOCCUS/TRICHOMONAS, NAA
Chlamydia by NAA: NEGATIVE
Gonococcus by NAA: NEGATIVE
Trich vag by NAA: NEGATIVE

## 2020-01-04 ENCOUNTER — Encounter: Payer: Self-pay | Admitting: Family Medicine

## 2020-01-06 NOTE — Progress Notes (Signed)
Pt was called to discuss his lab results. Pt stated he understood his results and keep his f/u appt.

## 2020-01-13 ENCOUNTER — Telehealth: Payer: Self-pay | Admitting: Family Medicine

## 2020-01-14 ENCOUNTER — Other Ambulatory Visit: Payer: Self-pay | Admitting: Family Medicine

## 2020-01-14 DIAGNOSIS — K029 Dental caries, unspecified: Secondary | ICD-10-CM

## 2020-01-14 DIAGNOSIS — K047 Periapical abscess without sinus: Secondary | ICD-10-CM

## 2020-01-14 DIAGNOSIS — K0889 Other specified disorders of teeth and supporting structures: Secondary | ICD-10-CM

## 2020-01-14 MED ORDER — AMOXICILLIN-POT CLAVULANATE 875-125 MG PO TABS: 1.0000 | ORAL_TABLET | Freq: Two times a day (BID) | ORAL | 0 refills | Status: AC

## 2020-01-14 NOTE — Telephone Encounter (Signed)
Done

## 2020-01-27 DIAGNOSIS — B342 Coronavirus infection, unspecified: Secondary | ICD-10-CM

## 2020-01-27 HISTORY — DX: Coronavirus infection, unspecified: B34.2

## 2020-02-23 ENCOUNTER — Ambulatory Visit (HOSPITAL_COMMUNITY)
Admission: EM | Admit: 2020-02-23 | Discharge: 2020-02-23 | Disposition: A | Discharge status: Home / Self Care | Payer: 59 | Attending: Family Medicine | Admitting: Family Medicine

## 2020-02-23 ENCOUNTER — Other Ambulatory Visit: Payer: Self-pay

## 2020-02-23 ENCOUNTER — Encounter (HOSPITAL_COMMUNITY): Payer: Self-pay | Admitting: Emergency Medicine

## 2020-02-23 DIAGNOSIS — Z888 Allergy status to other drugs, medicaments and biological substances status: Secondary | ICD-10-CM | POA: Insufficient documentation

## 2020-02-23 DIAGNOSIS — J4520 Mild intermittent asthma, uncomplicated: Secondary | ICD-10-CM | POA: Diagnosis not present

## 2020-02-23 DIAGNOSIS — U071 COVID-19: Secondary | ICD-10-CM | POA: Insufficient documentation

## 2020-02-23 DIAGNOSIS — J069 Acute upper respiratory infection, unspecified: Secondary | ICD-10-CM | POA: Diagnosis not present

## 2020-02-23 DIAGNOSIS — Z79899 Other long term (current) drug therapy: Secondary | ICD-10-CM | POA: Diagnosis not present

## 2020-02-23 DIAGNOSIS — Z87891 Personal history of nicotine dependence: Secondary | ICD-10-CM | POA: Insufficient documentation

## 2020-02-23 LAB — SARS CORONAVIRUS 2 (TAT 6-24 HRS): SARS Coronavirus 2: POSITIVE — AB

## 2020-02-23 MED ORDER — PROMETHAZINE-DM 6.25-15 MG/5ML PO SYRP: 5.0000 mL | ORAL_SOLUTION | Freq: Four times a day (QID) | ORAL | 0 refills | Status: DC | PRN

## 2020-02-23 NOTE — ED Triage Notes (Signed)
Patient c/o fatigue, nasal congestion, productive cough "green sputum", chills, and SOB x 2 days.   Patient endorses a fever without a temperature recording from home.   Patient endorses being around a COVID positive individual.   Patient has taken OTC Dayquil and Nyquil w/ no relief of symptoms.

## 2020-02-23 NOTE — ED Provider Notes (Signed)
[facility]    CSN: [id number] Arrival date & time: 02/23/20  9528      History   Chief Complaint Chief Complaint  Patient presents with  . Fatigue  . Cough    HPI Walter Howell is a 34 y.o. male.   Here today with 2 day history of fatigue, nasal congestion, productive cough, chills, wheezing, SOB, low grade fevers subjectively. Denies CP, abdominal pain, N/V/D. Taking dayquil and nyquil with minimal relief. Hx of asthma and allergies, on albuterol and antihistamines. Recent exposure to COVID + family member. UTD on vaccines.      Past Medical History:  Diagnosis Date  . Anxiety   . Asthma   . Dizziness   . GERD (gastroesophageal reflux disease)   . Hypertension   . Seasonal allergies   . Vertigo   . Vitamin D deficiency     Patient Active Problem List   Diagnosis Date Noted  . Mild intermittent asthma without complication 09/04/2019  . Obesity (BMI 35.0-39.9 without comorbidity) 09/04/2019  . Benign paroxysmal positional vertigo 07/23/2017  . TMJ (temporomandibular joint disorder) 07/23/2017  . Gastroesophageal reflux disease without esophagitis 07/23/2017  . History of herniated intervertebral disc 12/25/2016  . Palpitations 12/25/2016  . Anxiety and depression 12/06/2015  . Chronic bilateral low back pain without sciatica 12/06/2015  . Essential hypertension 07/08/2013    History reviewed. No pertinent surgical history.     Home Medications    Prior to Admission medications   Medication Sig Start Date End Date Taking? Authorizing Provider  busPIRone (BUSPAR) 15 MG tablet Take 1 tablet (15 mg total) by mouth 2 (two) times daily. 02/12/19  Yes Georgian Co M, PA-C  cetirizine (ZYRTEC) 10 MG tablet Take 1 tablet (10 mg total) by mouth daily. 11/23/19  Yes Kallie Locks, FNP  lisinopril-hydrochlorothiazide (ZESTORETIC) 10-12.5 MG tablet Take 1 tablet by mouth daily. 12/29/19  Yes Kallie Locks, FNP  omeprazole (PRILOSEC) 20 MG  capsule Take 1 capsule (20 mg total) by mouth daily. 11/23/19  Yes Kallie Locks, FNP  promethazine-dextromethorphan (PROMETHAZINE-DM) 6.25-15 MG/5ML syrup Take 5 mLs by mouth 4 (four) times daily as needed for cough. 02/23/20  Yes Particia Nearing, PA-C  acetaminophen (TYLENOL) 500 MG tablet Take 1,000 mg by mouth every 6 (six) hours as needed for mild pain, moderate pain, fever or headache.    [provider]  albuterol (VENTOLIN HFA) 108 (90 Base) MCG/ACT inhaler Inhale 2 puffs into the lungs every 6 (six) hours as needed for wheezing or shortness of breath. 11/23/19   Kallie Locks, FNP  cyclobenzaprine (FLEXERIL) 10 MG tablet Take 1 tablet (10 mg total) by mouth 3 (three) times daily as needed for muscle spasms. 12/29/19   Kallie Locks, FNP  fluticasone (FLONASE) 50 MCG/ACT nasal spray Place 1-2 sprays into both nostrils daily for 7 days. 07/29/19 08/05/19  Wieters, Hallie C, PA-C  ibuprofen (ADVIL) 800 MG tablet Take 1 tablet (800 mg total) by mouth every 8 (eight) hours as needed. 11/23/19   Kallie Locks, FNP  meclizine (ANTIVERT) 25 MG tablet Take 1 tablet (25 mg total) by mouth 3 (three) times daily as needed for dizziness. 11/23/19   Kallie Locks, FNP  Multiple Vitamin (MULTIVITAMIN WITH MINERALS) TABS tablet Take 1 tablet by mouth daily.    [provider]  Vitamin D, Ergocalciferol, (DRISDOL) 1.25 MG (50000 UT) CAPS capsule Take 1 capsule (50,000 Units total) by mouth every 7 (seven) days. 02/17/19  Anders Simmonds, PA-C    Family History Family History  Problem Relation Age of Onset  . Diabetes Maternal Grandmother   . Hypertension Maternal Grandmother   . Diabetes Maternal Grandfather   . Diabetes Paternal Grandmother   . Heart disease Paternal Grandmother   . Diabetes Paternal Grandfather     Social History Social History   Tobacco Use  . Smoking status: Former Games developer  . Smokeless tobacco: Never Used  Vaping Use  . Vaping Use:  Never used  Substance Use Topics  . Alcohol use: No  . Drug use: No     Allergies   Prednisone   Review of Systems Review of Systems PER HPI    Physical Exam Triage Vital Signs ED Triage Vitals  Enc Vitals Group     BP 02/23/20 1024 133/74     Pulse Rate 02/23/20 1024 75     Resp 02/23/20 1024 16     Temp 02/23/20 1024 98.4 F (36.9 C)     Temp Source 02/23/20 1024 Oral     SpO2 02/23/20 1024 95 %     Weight 02/23/20 1022 262 lb (118.8 kg)     Height 02/23/20 1022 5\' 11"  (1.803 m)     Head Circumference --      Peak Flow --      Pain Score 02/23/20 1021 7     Pain Loc --      Pain Edu? --      Excl. in GC? --    No data found.  Updated Vital Signs BP 133/74 (BP Location: Left Arm)   Pulse 75   Temp 98.4 F (36.9 C) (Oral)   Resp 16   Ht 5\' 11"  (1.803 m)   Wt 262 lb (118.8 kg)   SpO2 95%   BMI 36.54 kg/m   Visual Acuity Right Eye Distance:   Left Eye Distance:   Bilateral Distance:    Right Eye Near:   Left Eye Near:    Bilateral Near:     Physical Exam Vitals and nursing note reviewed.  Constitutional:      Appearance: Normal appearance.  HENT:     Head: Atraumatic.     Right Ear: Tympanic membrane normal.     Left Ear: Tympanic membrane normal.     Nose: Rhinorrhea present.     Mouth/Throat:     Mouth: Mucous membranes are moist.     Pharynx: Posterior oropharyngeal erythema present. No oropharyngeal exudate.  Eyes:     Extraocular Movements: Extraocular movements intact.     Conjunctiva/sclera: Conjunctivae normal.  Cardiovascular:     Rate and Rhythm: Normal rate and regular rhythm.     Heart sounds: Normal heart sounds.  Pulmonary:     Effort: Pulmonary effort is normal. No respiratory distress.     Breath sounds: Wheezing (mild scattered) present. No rales.  Abdominal:     General: Bowel sounds are normal. There is no distension.     Palpations: Abdomen is soft.     Tenderness: There is no abdominal tenderness. There is no  guarding.  Musculoskeletal:        General: Normal range of motion.     Cervical back: Normal range of motion and neck supple.  Skin:    General: Skin is warm and dry.  Neurological:     General: No focal deficit present.     Mental Status: He is oriented to person, place, and time.  Psychiatric:  Mood and Affect: Mood normal.        Thought Content: Thought content normal.        Judgment: Judgment normal.     UC Treatments / Results  Labs (all labs ordered are listed, but only abnormal results are displayed) Labs Reviewed  SARS CORONAVIRUS 2 (TAT 6-24 HRS)    EKG   Radiology No results found.  Procedures Procedures (including critical care time)  Medications Ordered in UC Medications - No data to display  Initial Impression / Assessment and Plan / UC Course  I have reviewed the triage vital signs and the nursing notes.  Pertinent labs & imaging results that were available during my care of the patient were reviewed by me and considered in my medical decision making (see chart for details).     COVID pcr pending, discussed continued asthma and allergy regimen, OTC decongestants, fever reducers. Work note given, isolation reviewed, phenergan DM for cough. Return if worsening sxs.   Final Clinical Impressions(s) / UC Diagnoses   Final diagnoses:  Viral URI with cough   Discharge Instructions   None    ED Prescriptions    Medication Sig Dispense Auth. Provider   promethazine-dextromethorphan (PROMETHAZINE-DM) 6.25-15 MG/5ML syrup Take 5 mLs by mouth 4 (four) times daily as needed for cough. 100 mL Particia Nearing, [place]     PDMP not reviewed this encounter.   Particia Nearing, [place] 02/23/20 1113

## 2020-03-30 ENCOUNTER — Telehealth (INDEPENDENT_AMBULATORY_CARE_PROVIDER_SITE_OTHER): Payer: 59 | Admitting: Family Medicine

## 2020-03-30 ENCOUNTER — Encounter: Payer: Self-pay | Admitting: Family Medicine

## 2020-03-30 VITALS — Ht 71.0 in | Wt 261.0 lb

## 2020-03-30 DIAGNOSIS — R059 Cough, unspecified: Secondary | ICD-10-CM

## 2020-03-30 DIAGNOSIS — B342 Coronavirus infection, unspecified: Secondary | ICD-10-CM | POA: Diagnosis not present

## 2020-03-30 DIAGNOSIS — Z09 Encounter for follow-up examination after completed treatment for conditions other than malignant neoplasm: Secondary | ICD-10-CM

## 2020-03-30 DIAGNOSIS — R0602 Shortness of breath: Secondary | ICD-10-CM

## 2020-03-30 MED ORDER — AMOXICILLIN-POT CLAVULANATE 875-125 MG PO TABS: 1.0000 | ORAL_TABLET | Freq: Two times a day (BID) | ORAL | 0 refills | Status: AC

## 2020-03-30 MED ORDER — BENZONATATE 100 MG PO CAPS: 100.0000 mg | ORAL_CAPSULE | Freq: Two times a day (BID) | ORAL | 0 refills | Status: DC | PRN

## 2020-03-30 MED ORDER — FLUTICASONE PROPIONATE 50 MCG/ACT NA SUSP: 1.0000 | Freq: Every day | NASAL | 11 refills | Status: DC

## 2020-03-30 NOTE — Progress Notes (Signed)
Virtual Visit via Telephone Note  I connected with Walter Howell on 03/30/20 at 10:20 AM EST by telephone and verified that I am speaking with the correct person using two identifiers.  Location: Patient: Home Provider: Office   I discussed the limitations, risks, security and privacy concerns of performing an evaluation and management service by telephone and the availability of in person appointments. I also discussed with the patient that there may be a patient responsible charge related to this service. The patient expressed understanding and agreed to proceed.   History of Present Illness:  Patient Active Problem List   Diagnosis Date Noted  . Mild intermittent asthma without complication 09/04/2019  . Obesity (BMI 35.0-39.9 without comorbidity) 09/04/2019  . Benign paroxysmal positional vertigo 07/23/2017  . TMJ (temporomandibular joint disorder) 07/23/2017  . Gastroesophageal reflux disease without esophagitis 07/23/2017  . History of herniated intervertebral disc 12/25/2016  . Palpitations 12/25/2016  . Anxiety and depression 12/06/2015  . Chronic bilateral low back pain without sciatica 12/06/2015  . Essential hypertension 07/08/2013   Current Status: Since his last office visit, he has had an Urgent Care visit and teste positive for Coronavirus on 02/23/2020 is doing well with no complaints.  He continues to experience headaches, shortness of breath on exertion and occasionally at rest, and cough. He is currently taking Zinc, MVI, Vitamin D, and teas. He denies fevers, chills, fatigue, recent infections, weight loss, and night sweats. He has not had any visual changes, dizziness, and falls. No chest pain, heart palpitations reported. Denies GI problems such as nausea, vomiting, diarrhea, and constipation. He has no reports of blood in stools, dysuria and hematuria. No depression or anxiety reported today. He is taking all medications as prescribed. He denies pain today.    Observations/Objective:  Telephone Virtual Visit   Assessment and Plan:  1. Coronavirus infection Discussed symptomatic treatment for Covid infection. Discussed red flag symptoms and when to seek medical attention at urgent care or the emergency department.  - amoxicillin-clavulanate (AUGMENTIN) 875-125 MG tablet; Take 1 tablet by mouth 2 (two) times daily for 10 days.  Dispense: 20 tablet; Refill: 0 - fluticasone (FLONASE) 50 MCG/ACT nasal spray; Place 1-2 sprays into both nostrils daily for 7 days.  Dispense: 9.9 g; Refill: 11  2. Cough - benzonatate (TESSALON) 100 MG capsule; Take 1 capsule (100 mg total) by mouth 2 (two) times daily as needed for cough.  Dispense: 20 capsule; Refill: 0  3. Shortness of breath Resolved.   4. Follow up He will follow up in 1 month.  Meds ordered this encounter  Medications  . benzonatate (TESSALON) 100 MG capsule    Sig: Take 1 capsule (100 mg total) by mouth 2 (two) times daily as needed for cough.    Dispense:  20 capsule    Refill:  0  . amoxicillin-clavulanate (AUGMENTIN) 875-125 MG tablet    Sig: Take 1 tablet by mouth 2 (two) times daily for 10 days.    Dispense:  20 tablet    Refill:  0  . fluticasone (FLONASE) 50 MCG/ACT nasal spray    Sig: Place 1-2 sprays into both nostrils daily for 7 days.    Dispense:  9.9 g    Refill:  11    No orders of the defined types were placed in this encounter.   Referral Orders  No referral(s) requested today    Raliegh Ip, MSN, ANE, FNP-BC [place] [facility] [phone]  [address]  [place], [place] [address] [phone] fax   I discussed the assessment and treatment plan with the patient. The patient was provided an opportunity to ask questions and all were answered. The patient agreed with the plan and demonstrated an understanding of the instructions.   The patient was advised to call back or  seek an in-person evaluation if the symptoms worsen or if the condition fails to improve as anticipated.  I provided 20 minutes of non-face-to-face time during this encounter.   Kallie Locks, FNP

## 2020-04-01 ENCOUNTER — Encounter: Payer: Self-pay | Admitting: Family Medicine

## 2020-04-10 ENCOUNTER — Other Ambulatory Visit: Payer: Self-pay | Admitting: Internal Medicine

## 2020-04-10 DIAGNOSIS — I10 Essential (primary) hypertension: Secondary | ICD-10-CM

## 2020-04-14 ENCOUNTER — Other Ambulatory Visit: Payer: Self-pay | Admitting: Internal Medicine

## 2020-04-14 DIAGNOSIS — I10 Essential (primary) hypertension: Secondary | ICD-10-CM

## 2020-04-27 ENCOUNTER — Ambulatory Visit: Payer: Self-pay | Admitting: Family Medicine

## 2020-05-02 ENCOUNTER — Encounter: Payer: Self-pay | Admitting: Family Medicine

## 2020-05-02 ENCOUNTER — Other Ambulatory Visit: Payer: Self-pay

## 2020-05-02 ENCOUNTER — Ambulatory Visit (INDEPENDENT_AMBULATORY_CARE_PROVIDER_SITE_OTHER): Payer: 59 | Admitting: Family Medicine

## 2020-05-02 VITALS — BP 149/91 | HR 75 | Ht 71.0 in | Wt 280.0 lb

## 2020-05-02 DIAGNOSIS — Z8709 Personal history of other diseases of the respiratory system: Secondary | ICD-10-CM

## 2020-05-02 DIAGNOSIS — Z09 Encounter for follow-up examination after completed treatment for conditions other than malignant neoplasm: Secondary | ICD-10-CM | POA: Diagnosis not present

## 2020-05-02 DIAGNOSIS — R21 Rash and other nonspecific skin eruption: Secondary | ICD-10-CM | POA: Diagnosis not present

## 2020-05-02 DIAGNOSIS — Z202 Contact with and (suspected) exposure to infections with a predominantly sexual mode of transmission: Secondary | ICD-10-CM

## 2020-05-02 DIAGNOSIS — Z8616 Personal history of COVID-19: Secondary | ICD-10-CM | POA: Diagnosis not present

## 2020-05-02 DIAGNOSIS — I10 Essential (primary) hypertension: Secondary | ICD-10-CM

## 2020-05-02 MED ORDER — HYDROCORTISONE 1 % EX CREA: 1.0000 "application " | TOPICAL_CREAM | Freq: Two times a day (BID) | CUTANEOUS | 2 refills | Status: DC

## 2020-05-02 MED ORDER — LOSARTAN POTASSIUM-HCTZ 100-12.5 MG PO TABS: 1.0000 | ORAL_TABLET | Freq: Every day | ORAL | 3 refills | Status: DC

## 2020-05-02 NOTE — Progress Notes (Signed)
[facility]  Hospital Follow Up   Subjective:  Patient ID: Walter Howell, male    DOB: 04-30-1985  Age: 35 y.o. MRN: [id number]  CC:  Chief Complaint  Patient presents with  . Rash    HPI Walter Howell is a 35 year old male who presents for Hospital Follow Up today.    Patient Active Problem List   Diagnosis Date Noted  . Mild intermittent asthma without complication 09/04/2019  . Obesity (BMI 35.0-39.9 without comorbidity) 09/04/2019  . Benign paroxysmal positional vertigo 07/23/2017  . TMJ (temporomandibular joint disorder) 07/23/2017  . Gastroesophageal reflux disease without esophagitis 07/23/2017  . History of herniated intervertebral disc 12/25/2016  . Palpitations 12/25/2016  . Anxiety and depression 12/06/2015  . Chronic bilateral low back pain without sciatica 12/06/2015  . Essential hypertension 07/08/2013   Current Status: Since his last office visit, he has had an Urgent Care visit for URI on 02/23/2020. Today, he has complaints of intermittent shortness of breath. He denies fevers, chills, fatigue, recent infections, weight loss, and night sweats. He has not had any headaches, visual changes, dizziness, and falls. No chest pain, heart palpitations, cough reported. Denies GI problems such as nausea, vomiting, diarrhea, and constipation. He has no reports of blood in stools, dysuria and hematuria. No depression or anxiety reported today. He is taking all medications as prescribed. He denies pain today.   Past Medical History:  Diagnosis Date  . Anxiety   . Asthma   . Coronavirus infection 01/2020  . COVID   . Dizziness   . GERD (gastroesophageal reflux disease)   . Hypertension   . Seasonal allergies   . Vertigo   . Vitamin D deficiency     No past surgical history on file.  Family History  Problem Relation Age of Onset  . Diabetes Maternal Grandmother   . Hypertension Maternal Grandmother    . Diabetes Maternal Grandfather   . Diabetes Paternal Grandmother   . Heart disease Paternal Grandmother   . Diabetes Paternal Grandfather     Social History   Socioeconomic History  . Marital status: Significant Other    Spouse name: Not on file  . Number of children: Not on file  . Years of education: Not on file  . Highest education level: Not on file  Occupational History  . Not on file  Tobacco Use  . Smoking status: Former Games developermoker  . Smokeless tobacco: Never Used  Vaping Use  . Vaping Use: Never used  Substance and Sexual Activity  . Alcohol use: No  . Drug use: No  . Sexual activity: Not on file  Other Topics Concern  . Not on file  Social History Narrative  . Not on file   Social Determinants of Health   Financial Resource Strain: Not on file  Food Insecurity: Not on file  Transportation Needs: Not on file  Physical Activity: Not on file  Stress: Not on file  Social Connections: Not on file  Intimate Partner Violence: Not on file    Outpatient Medications Prior to Visit  Medication Sig Dispense Refill  . acetaminophen (TYLENOL) 500 MG tablet Take 1,000 mg by mouth every 6 (six) hours as needed for mild pain, moderate pain, fever or headache.    . albuterol (VENTOLIN HFA) 108 (90 Base) MCG/ACT inhaler Inhale 2 puffs into the lungs every 6 (six) hours as needed for wheezing or shortness of breath. 18 g 11  .  busPIRone (BUSPAR) 15 MG tablet Take 1 tablet (15 mg total) by mouth 2 (two) times daily. 60 tablet 3  . cetirizine (ZYRTEC) 10 MG tablet Take 1 tablet (10 mg total) by mouth daily. 90 tablet 3  . cyclobenzaprine (FLEXERIL) 10 MG tablet Take 1 tablet (10 mg total) by mouth 3 (three) times daily as needed for muscle spasms. 30 tablet 3  . fluticasone (FLONASE) 50 MCG/ACT nasal spray Place 1-2 sprays into both nostrils daily for 7 days. 9.9 g 11  . ibuprofen (ADVIL) 800 MG tablet Take 1 tablet (800 mg total) by mouth every 8 (eight) hours as needed. 30 tablet  3  . meclizine (ANTIVERT) 25 MG tablet Take 1 tablet (25 mg total) by mouth 3 (three) times daily as needed for dizziness. 90 tablet 6  . Multiple Vitamin (MULTIVITAMIN WITH MINERALS) TABS tablet Take 1 tablet by mouth daily.    Marland Kitchen omeprazole (PRILOSEC) 20 MG capsule Take 1 capsule (20 mg total) by mouth daily. 90 capsule 3  . Vitamin D, Ergocalciferol, (DRISDOL) 1.25 MG (50000 UT) CAPS capsule Take 1 capsule (50,000 Units total) by mouth every 7 (seven) days. 16 capsule 0  . lisinopril-hydrochlorothiazide (ZESTORETIC) 10-12.5 MG tablet Take 1 tablet by mouth daily. 30 tablet 11  . benzonatate (TESSALON) 100 MG capsule Take 1 capsule (100 mg total) by mouth 2 (two) times daily as needed for cough. (Patient not taking: Reported on 05/02/2020) 20 capsule 0  . promethazine-dextromethorphan (PROMETHAZINE-DM) 6.25-15 MG/5ML syrup Take 5 mLs by mouth 4 (four) times daily as needed for cough. (Patient not taking: Reported on 05/02/2020) 100 mL 0   No facility-administered medications prior to visit.    Allergies  Allergen Reactions  . Prednisone     "makes him feel weird"    ROS Review of Systems  Constitutional: Negative.   HENT: Negative.   Eyes: Negative.   Respiratory: Negative.   Cardiovascular: Negative.   Gastrointestinal: Negative.   Endocrine: Negative.   Genitourinary: Negative.   Musculoskeletal: Negative.   Skin: Negative.   Allergic/Immunologic: Negative.   Neurological: Negative.   Hematological: Negative.   Psychiatric/Behavioral: Negative.       Objective:    Physical Exam Vitals reviewed.  Constitutional:      Appearance: Normal appearance.  HENT:     Head: Normocephalic and atraumatic.     Nose: Nose normal.     Mouth/Throat:     Mouth: Mucous membranes are moist.     Pharynx: Oropharynx is clear.  Cardiovascular:     Rate and Rhythm: Normal rate and regular rhythm.     Pulses: Normal pulses.     Heart sounds: Normal heart sounds.  Pulmonary:     Effort:  Pulmonary effort is normal.     Breath sounds: Normal breath sounds.  Abdominal:     General: Bowel sounds are normal.     Palpations: Abdomen is soft.  Musculoskeletal:        General: Normal range of motion.     Cervical back: Normal range of motion and neck supple.  Skin:    General: Skin is warm and dry.  Neurological:     General: No focal deficit present.     Mental Status: He is alert and oriented to person, place, and time.  Psychiatric:        Mood and Affect: Mood normal.        Behavior: Behavior normal.        Thought Content: Thought content normal.  Judgment: Judgment normal.     BP (!) 149/91 Comment: took medication 1 hour ago  Pulse 75   Ht 5\' 11"  (1.803 m)   Wt 280 lb (127 kg)   SpO2 99%   BMI 39.05 kg/m  Wt Readings from Last 3 Encounters:  05/02/20 280 lb (127 kg)  03/30/20 261 lb (118.4 kg)  02/23/20 262 lb (118.8 kg)     Health Maintenance Due  Topic Date Due  . Hepatitis C Screening  Never done  . COVID-19 Vaccine (1) Never done  . TETANUS/TDAP  Never done  . INFLUENZA VACCINE  Never done    There are no preventive care reminders to display for this patient.  Lab Results  Component Value Date   TSH 2.720 11/23/2019   Lab Results  Component Value Date   WBC 5.8 11/23/2019   HGB 16.9 11/23/2019   HCT 50.2 11/23/2019   MCV 93 11/23/2019   PLT 247 11/23/2019   Lab Results  Component Value Date   NA 138 11/23/2019   K 3.4 (L) 11/23/2019   CO2 25 11/23/2019   GLUCOSE 106 (H) 11/23/2019   BUN 17 11/23/2019   CREATININE 1.02 11/23/2019   BILITOT 0.4 11/23/2019   ALKPHOS 45 11/23/2019   AST 27 11/23/2019   ALT 49 (H) 11/23/2019   PROT 7.7 11/23/2019   ALBUMIN 4.6 11/23/2019   CALCIUM 9.9 11/23/2019   ANIONGAP 9 01/18/2017   Lab Results  Component Value Date   CHOL 217 (H) 11/23/2019   Lab Results  Component Value Date   HDL 39 (L) 11/23/2019   Lab Results  Component Value Date   LDLCALC 141 (H) 11/23/2019   Lab  Results  Component Value Date   TRIG 207 (H) 11/23/2019   Lab Results  Component Value Date   CHOLHDL 5.6 (H) 11/23/2019   Lab Results  Component Value Date   HGBA1C 5.6 11/23/2019   HGBA1C 5.6 11/23/2019   HGBA1C 5.6 (A) 11/23/2019   HGBA1C 5.6 11/23/2019      Assessment & Plan:   1. Hospital discharge follow-up  2. History of URI (upper respiratory infection) Resolved.  3. History of 2019 novel coronavirus disease (COVID-19) Resolved with mild residual intermittent shortness of breath. She is following recommended quarantine precautions. Discussed symptomatic treatment for Covid infection. Discussed red flag symptoms and when to seek medical attention at urgent care or the emergency department.   4. Skin rash - hydrocortisone cream 1 %; Apply 1 application topically 2 (two) times daily.  Dispense: 30 g; Refill: 2  5. Essential hypertension The current medical regimen is effective; blood pressure is stable today; continue present plan and medications as prescribed. He will continue to take medications as prescribed, to decrease high sodium intake, excessive alcohol intake, increase potassium intake, smoking cessation, and increase physical activity of at least 30 minutes of cardio activity daily. He will continue to follow Heart Healthy or DASH diet. - losartan-hydrochlorothiazide (HYZAAR) 100-12.5 MG tablet; Take 1 tablet by mouth daily.  Dispense: 90 tablet; Refill: 3  6. Possible exposure to STD - Chlamydia/Gonococcus/Trichomonas, NAA  7. Follow up He will follow up in 1 month.   Meds ordered this encounter  Medications  . hydrocortisone cream 1 %    Sig: Apply 1 application topically 2 (two) times daily.    Dispense:  30 g    Refill:  2  . losartan-hydrochlorothiazide (HYZAAR) 100-12.5 MG tablet    Sig: Take 1 tablet by mouth daily.  Dispense:  90 tablet    Refill:  3    Orders Placed This Encounter  Procedures  . Chlamydia/Gonococcus/Trichomonas, NAA     Referral Orders  No referral(s) requested today    Raliegh Ip, MSN, ANE, FNP-BC [facility] [address] [place], [place] [address] [phone] fax   Problem List Items Addressed This Visit      Cardiovascular and Mediastinum   Essential hypertension (Chronic)   Relevant Medications   losartan-hydrochlorothiazide (HYZAAR) 100-12.5 MG tablet    Other Visit Diagnoses    Hospital discharge follow-up    -  Primary   History of URI (upper respiratory infection)       History of 2019 novel coronavirus disease (COVID-19)       Skin rash       Relevant Medications   hydrocortisone cream 1 %   Possible exposure to STD       Relevant Orders   Chlamydia/Gonococcus/Trichomonas, NAA (Completed)   Follow up          Meds ordered this encounter  Medications  . hydrocortisone cream 1 %    Sig: Apply 1 application topically 2 (two) times daily.    Dispense:  30 g    Refill:  2  . losartan-hydrochlorothiazide (HYZAAR) 100-12.5 MG tablet    Sig: Take 1 tablet by mouth daily.    Dispense:  90 tablet    Refill:  3    Follow-up: No follow-ups on file.    Kallie Locks, FNP

## 2020-05-02 NOTE — Patient Instructions (Addendum)
Hydrocortisone; Urea skin cream What is this medicine? HYDROCORTISONE; UREA (hye droe KOR ti sone; yoo REE uh) is a combination of a corticosteroid and a moisturizer. It is used on the skin to reduce swelling, redness, itching, and skin irritation. This medicine may be used for other purposes; ask your health care provider or pharmacist if you have questions. COMMON BRAND NAME(S): Carmol HC, Keratol HC, U-Cort What should I tell my health care provider before I take this medicine? They need to know if you have any of these conditions:  large areas of burned or damaged skin  skin infection  skin wasting or thinning  an unusual or allergic reaction to hydrocortisone, urea, sulfites, other medicines, foods, dyes, or preservatives  pregnant or trying to get pregnant  breast-feeding How should I use this medicine? This medicine is for external use only. Do not take by mouth. Follow the directions on the prescription label. Wash your hands before and after use. Do not cover with a bandage or dressing unless your doctor or health care professional tells you to. Use your medicine at regular intervals. Do not use it more often than directed. Talk to your pediatrician regarding the use of this medicine in children. Special care may be needed. Do not use this medicine for diaper rash unless directed to do so by your doctor. If applying this medicine to the diaper area, do not cover with tight-fitting diapers or plastic pants. This may increase the amount of medicine that passes through the skin and increase the risk of serious side effects. Overdosage: If you think you have taken too much of this medicine contact a poison control center or emergency room at once. NOTE: This medicine is only for you. Do not share this medicine with others. What if I miss a dose? If you miss a dose, use it as soon as you can. If it is almost time for your next dose, use only that dose. Do not use double or extra  doses. What may interact with this medicine? Interactions are not expected. Do not use any other skin products on the same area of skin without asking your doctor or health care professional. This list may not describe all possible interactions. Give your health care provider a list of all the medicines, herbs, non-prescription drugs, or dietary supplements you use. Also tell them if you smoke, drink alcohol, or use illegal drugs. Some items may interact with your medicine. What should I watch for while using this medicine? Tell your doctor or health care professional if your symptoms do not start to get better or if they get worse. Do not get this medicine in your eyes. If you do, rinse out with plenty of cool tap water. This medicine can make certain skin conditions worse. Only use it for conditions for which your doctor or health care professional has prescribed. What side effects may I notice from receiving this medicine? Side effects that you should report to your doctor or health care professional as soon as possible:  allergic reactions like skin rash, itching or hives, swelling of the face, lips, or tongue  burning feeling on the skin  dark red spots on the skin  infection  lack of healing of skin condition  painful, red, pus filled blisters in hair follicles  thinning of the skin Side effects that usually do not require medical attention (report to your doctor or health care professional if they continue or are bothersome):  dry skin, irritation  unusual increased  growth of hair on the face or body This list may not describe all possible side effects. Call your doctor for medical advice about side effects. You may report side effects to FDA at 1-800-FDA-1088. Where should I keep my medicine? Keep out of the reach of children. Store at room temperature between 15 and 30 degrees C (59 and 86 degrees F). Do not freeze. Keep tightly closed. Throw away any unused medicine after  the expiration date. NOTE: This sheet is a summary. It may not cover all possible information. If you have questions about this medicine, talk to your doctor, pharmacist, or health care provider.  2021 Elsevier/Gold Standard (2017-11-13 11:57:16) Losartan; Hydrochlorothiazide, HCTZ Oral Tablets What is this medicine? LOSARTAN; HYDROCHLOROTHIAZIDE (loe SAR tan; hye droe klor oh THYE a zide) is a combination of an angiotensin II receptor blocker and a diuretic. It treats high blood pressure. It may also be used to lower the risk of stroke. This medicine may be used for other purposes; ask your health care provider or pharmacist if you have questions. COMMON BRAND NAME(S): Hyzaar What should I tell my health care provider before I take this medicine? They need to know if you have any of these conditions:  decreased urine  diabetes  kidney disease  liver disease  if you are on a special diet, like a low-salt diet  immune system problems, like lupus  an unusual or allergic reaction to losartan, hydrochlorothiazide, sulfa drugs, other medicines, foods, dyes, or preservatives  pregnant or trying to get pregnant  breast-feeding How should I use this medicine? Take this drug by mouth. Take it as directed on the prescription label at the same time every day. You can take it with or without food. If it upsets your stomach, take it with food. Keep taking it unless your health care provider tells you to stop. Talk to your health care provider about the use of this drug in children. Special care may be needed. Overdosage: If you think you have taken too much of this medicine contact a poison control center or emergency room at once. NOTE: This medicine is only for you. Do not share this medicine with others. What if I miss a dose? If you miss a dose, take it as soon as you can. If it is almost time for your next dose, take only that dose. Do not take double or extra doses. What may interact  with this medicine? This medicine may also interact with the following medications:  barbiturates, like phenobarbital  blood pressure medicines  celecoxib  cimetidine  corticosteroids  diabetic medicines  diuretics, especially triamterene, spironolactone or amiloride  fluconazole  lithium  NSAIDs, medicines for pain and inflammation, like ibuprofen or naproxen  potassium salts or potassium supplements  prescription pain medicines  rifampin  skeletal muscle relaxants like tubocurarine  some cholesterol-lowering medicines like cholestyramine or colestipol This list may not describe all possible interactions. Give your health care provider a list of all the medicines, herbs, non-prescription drugs, or dietary supplements you use. Also tell them if you smoke, drink alcohol, or use illegal drugs. Some items may interact with your medicine. What should I watch for while using this medicine? Check your blood pressure regularly while you are taking this medicine. Ask your doctor or health care professional what your blood pressure should be and when you should contact him or her. When you check your blood pressure, write down the measurements to show your doctor or health care professional. If you  are taking this medicine for a long time, you must visit your health care professional for regular checks on your progress. Make sure you schedule appointments on a regular basis. You must not get dehydrated. Ask your doctor or health care professional how much fluid you need to drink a day. Check with him or her if you get an attack of severe diarrhea, nausea and vomiting, or if you sweat a lot. The loss of too much body fluid can make it dangerous for you to take this medicine. Women should inform their doctor if they wish to become pregnant or think they might be pregnant. There is a potential for serious side effects to an unborn child, particularly in the second or third trimester. Talk to  your health care professional or pharmacist for more information. You may get drowsy or dizzy. Do not drive, use machinery, or do anything that needs mental alertness until you know how this drug affects you. Do not stand or sit up quickly, especially if you are an older patient. This reduces the risk of dizzy or fainting spells. Alcohol can make you more drowsy and dizzy. Avoid alcoholic drinks. This medicine may increase blood sugar. Ask your healthcare provider if changes in diet or medicines are needed if you have diabetes. Talk to your health care professional about your risk of skin cancer. You may be more at risk for skin cancer if you take this medicine. This medicine can make you more sensitive to the sun. Keep out of the sun. If you cannot avoid being in the sun, wear protective clothing and use sunscreen. Do not use sun lamps or tanning beds/booths. Avoid salt substitutes unless you are told otherwise by your doctor or health care professional. Do not treat yourself for coughs, colds, or pain while you are taking this medicine without asking your doctor or health care professional for advice. Some ingredients may increase your blood pressure. What side effects may I notice from receiving this medicine? Side effects that you should report to your doctor or health care professional as soon as possible:  allergic reactions like skin rash, itching or hives, swelling of the face, lips, or tongue  breathing problems  changes in vision  dark urine  eye pain  fast or irregular heart beat, palpitations, or chest pain  feeling faint or lightheaded  muscle cramps  persistent dry cough  redness, blistering, peeling or loosening of the skin, including inside the mouth  signs and symptoms of high blood sugar such as being more thirsty or hungry or having to urinate more than normal. You may also feel very tired or have blurry vision.  stomach pain  trouble passing urine  unusual  bleeding or bruising  worsened gout pain  yellowing of the eyes or skin Side effects that usually do not require medical attention (report to your doctor or health care professional if they continue or are bothersome):  change in sex drive or performance  headache This list may not describe all possible side effects. Call your doctor for medical advice about side effects. You may report side effects to FDA at 1-800-FDA-1088. Where should I keep my medicine? Keep out of the reach of children and pets. Store at room temperature between 15 and 30 degrees C (59 and 86 degrees F). Protect from light. Keep the container tightly closed. Throw away any unused drug after the expiration date. NOTE: This sheet is a summary. It may not cover all possible information. If you have questions  about this medicine, talk to your doctor, pharmacist, or health care provider.  2021 Elsevier/Gold Standard (2019-01-06 17:07:13)

## 2020-05-03 ENCOUNTER — Encounter: Payer: Self-pay | Admitting: Family Medicine

## 2020-05-04 LAB — CHLAMYDIA/GONOCOCCUS/TRICHOMONAS, NAA
Chlamydia by NAA: NEGATIVE
Gonococcus by NAA: NEGATIVE
Trich vag by NAA: NEGATIVE

## 2020-05-18 ENCOUNTER — Other Ambulatory Visit: Payer: Self-pay | Admitting: Physician Assistant

## 2020-05-18 NOTE — Telephone Encounter (Signed)
Requested medications are due for refill today.  unknown  Requested medications are on the active medications list.  no  Last refill. unknown  Future visit scheduled.   no  Notes to clinic.  Please advise. 

## 2020-05-23 ENCOUNTER — Telehealth: Payer: Self-pay | Admitting: Family Medicine

## 2020-05-23 ENCOUNTER — Other Ambulatory Visit: Payer: Self-pay | Admitting: Family Medicine

## 2020-05-23 DIAGNOSIS — I10 Essential (primary) hypertension: Secondary | ICD-10-CM

## 2020-05-23 MED ORDER — LISINOPRIL-HYDROCHLOROTHIAZIDE 20-25 MG PO TABS: 1.0000 | ORAL_TABLET | Freq: Every day | ORAL | 3 refills | Status: DC

## 2020-05-23 NOTE — Telephone Encounter (Signed)
Please see Rx request

## 2020-05-23 NOTE — Telephone Encounter (Signed)
Spoke with Walter Howell in detail today. Patient recently initiated on Losartan-HCTZ. Patient states that medication is not effective. He has re-started taking Lisinopril-HCTZ today. Patient advised to monitor home blood pressure readings, contact office as needed, and keep follow up appointments as scheduled.

## 2020-06-03 ENCOUNTER — Ambulatory Visit: Payer: Self-pay | Admitting: Family Medicine

## 2020-07-27 ENCOUNTER — Telehealth (HOSPITAL_COMMUNITY): Payer: Self-pay

## 2020-07-27 ENCOUNTER — Other Ambulatory Visit: Payer: Self-pay

## 2020-07-27 ENCOUNTER — Encounter (HOSPITAL_COMMUNITY): Payer: Self-pay

## 2020-07-27 ENCOUNTER — Ambulatory Visit (HOSPITAL_COMMUNITY)
Admission: EM | Admit: 2020-07-27 | Discharge: 2020-07-27 | Disposition: A | Discharge status: Home / Self Care | Payer: 59 | Attending: Emergency Medicine | Admitting: Emergency Medicine

## 2020-07-27 DIAGNOSIS — J069 Acute upper respiratory infection, unspecified: Secondary | ICD-10-CM | POA: Diagnosis not present

## 2020-07-27 MED ORDER — BUDESONIDE 90 MCG/ACT IN AEPB: 1.0000 | INHALATION_SPRAY | Freq: Two times a day (BID) | RESPIRATORY_TRACT | 0 refills | Status: AC

## 2020-07-27 MED ORDER — BENZONATATE 100 MG PO CAPS: 100.0000 mg | ORAL_CAPSULE | Freq: Three times a day (TID) | ORAL | 0 refills | Status: DC

## 2020-07-27 NOTE — ED Provider Notes (Signed)
[facility]    CSN: [id number] Arrival date & time: 07/27/20  8295      History   Chief Complaint Chief Complaint  Patient presents with  . Chest Pain  . Arm Pain  . Shortness of Breath  . Fatigue  . Cough  . Wheezing    HPI Walter Howell is a 35 y.o. male.   Patient presents with increased shortness of breath with exertion and intermittently, productive cough, intermittent wheezing, chest soreness and fatigue for about 7 days.  Endorses a mild sore throat, nasal congestion. denies headache, ear pain, eye pain, sinus pressure, fever, chills, body aches. COVID test on May 31 pending. known exposure.  History of asthma, using albuterol inhaler.  Attempted use of Sudafed and Robitussin with no relief.   Past Medical History:  Diagnosis Date  . Anxiety   . Asthma   . Coronavirus infection 01/2020  . COVID   . Dizziness   . GERD (gastroesophageal reflux disease)   . Hypertension   . Seasonal allergies   . Vertigo   . Vitamin D deficiency     Patient Active Problem List   Diagnosis Date Noted  . Mild intermittent asthma without complication 09/04/2019  . Obesity (BMI 35.0-39.9 without comorbidity) 09/04/2019  . Benign paroxysmal positional vertigo 07/23/2017  . TMJ (temporomandibular joint disorder) 07/23/2017  . Gastroesophageal reflux disease without esophagitis 07/23/2017  . History of herniated intervertebral disc 12/25/2016  . Palpitations 12/25/2016  . Anxiety and depression 12/06/2015  . Chronic bilateral low back pain without sciatica 12/06/2015  . Essential hypertension 07/08/2013    History reviewed. No pertinent surgical history.     Home Medications    Prior to Admission medications   Medication Sig Start Date End Date Taking? Authorizing Provider  acetaminophen (TYLENOL) 500 MG tablet Take 1,000 mg by mouth every 6 (six) hours as needed for mild pain, moderate pain, fever or headache.   Yes [provider]  albuterol  (VENTOLIN HFA) 108 (90 Base) MCG/ACT inhaler Inhale 2 puffs into the lungs every 6 (six) hours as needed for wheezing or shortness of breath. 11/23/19  Yes Kallie Locks, FNP  benzonatate (TESSALON) 100 MG capsule Take 1 capsule (100 mg total) by mouth every 8 (eight) hours. 07/27/20  Yes Trent Theisen R, NP  Budesonide 90 MCG/ACT inhaler Inhale 1 puff into the lungs 2 (two) times daily. 07/27/20  Yes Zaire Levesque R, NP  busPIRone (BUSPAR) 15 MG tablet Take 1 tablet (15 mg total) by mouth 2 (two) times daily. 02/12/19  Yes Georgian Co M, PA-C  cetirizine (ZYRTEC) 10 MG tablet Take 1 tablet (10 mg total) by mouth daily. 11/23/19  Yes Kallie Locks, FNP  cyclobenzaprine (FLEXERIL) 10 MG tablet Take 1 tablet (10 mg total) by mouth 3 (three) times daily as needed for muscle spasms. 12/29/19  Yes Kallie Locks, FNP  ibuprofen (ADVIL) 800 MG tablet Take 1 tablet (800 mg total) by mouth every 8 (eight) hours as needed. 11/23/19  Yes Kallie Locks, FNP  lisinopril-hydrochlorothiazide (ZESTORETIC) 20-25 MG tablet Take 1 tablet by mouth daily. 05/23/20  Yes Kallie Locks, FNP  meclizine (ANTIVERT) 25 MG tablet Take 1 tablet (25 mg total) by mouth 3 (three) times daily as needed for dizziness. 11/23/19  Yes Kallie Locks, FNP  Multiple Vitamin (MULTIVITAMIN WITH MINERALS) TABS tablet Take 1 tablet by mouth daily.   Yes [provider]  omeprazole (PRILOSEC) 20 MG capsule Take 1 capsule (  20 mg total) by mouth daily. 11/23/19  Yes Kallie Locks, FNP  Vitamin D, Ergocalciferol, (DRISDOL) 1.25 MG (50000 UT) CAPS capsule Take 1 capsule (50,000 Units total) by mouth every 7 (seven) days. 02/17/19  Yes McClung, Angela M, PA-C  fluticasone (FLONASE) 50 MCG/ACT nasal spray Place 1-2 sprays into both nostrils daily for 7 days. 03/30/20 04/06/20  Kallie Locks, FNP  hydrocortisone cream 1 % Apply 1 application topically 2 (two) times daily. 05/02/20   Kallie Locks, FNP    Family  History Family History  Problem Relation Age of Onset  . Diabetes Maternal Grandmother   . Hypertension Maternal Grandmother   . Diabetes Maternal Grandfather   . Diabetes Paternal Grandmother   . Heart disease Paternal Grandmother   . Diabetes Paternal Grandfather     Social History Social History   Tobacco Use  . Smoking status: Former Games developer  . Smokeless tobacco: Never Used  Vaping Use  . Vaping Use: Never used  Substance Use Topics  . Alcohol use: No  . Drug use: No     Allergies   Prednisone   Review of Systems Review of Systems  Defer to HPI    Physical Exam Triage Vital Signs ED Triage Vitals  Enc Vitals Group     BP 07/27/20 0839 (!) 146/86     Pulse Rate 07/27/20 0839 72     Resp 07/27/20 0839 (!) 22     Temp 07/27/20 0839 98 F (36.7 C)     Temp src --      SpO2 07/27/20 0839 96 %     Weight --      Height --      Head Circumference --      Peak Flow --      Pain Score 07/27/20 0832 8     Pain Loc --      Pain Edu? --      Excl. in GC? --    No data found.  Updated Vital Signs BP (!) 146/86   Pulse 72   Temp 98 F (36.7 C)   Resp (!) 22   SpO2 96%   Visual Acuity Right Eye Distance:   Left Eye Distance:   Bilateral Distance:    Right Eye Near:   Left Eye Near:    Bilateral Near:     Physical Exam Constitutional:      Appearance: He is well-developed and normal weight.  HENT:     Head: Normocephalic.     Right Ear: Tympanic membrane, ear canal and external ear normal.     Left Ear: Tympanic membrane, ear canal and external ear normal.     Nose: Congestion present. No rhinorrhea.     Mouth/Throat:     Mouth: Mucous membranes are moist.     Pharynx: Posterior oropharyngeal erythema present.  Eyes:     Extraocular Movements: Extraocular movements intact.     Conjunctiva/sclera: Conjunctivae normal.     Pupils: Pupils are equal, round, and reactive to light.  Cardiovascular:     Rate and Rhythm: Normal rate and regular  rhythm.     Pulses: Normal pulses.     Heart sounds: Normal heart sounds.  Pulmonary:     Effort: Pulmonary effort is normal.     Breath sounds: Normal breath sounds. No wheezing.  Musculoskeletal:     Cervical back: Normal range of motion.  Lymphadenopathy:     Cervical: Cervical adenopathy present.  Skin:  General: Skin is warm and dry.  Neurological:     Mental Status: He is alert and oriented to person, place, and time. Mental status is at baseline.  Psychiatric:        Mood and Affect: Mood normal.        Behavior: Behavior normal.        Thought Content: Thought content normal.        Judgment: Judgment normal.      UC Treatments / Results  Labs (all labs ordered are listed, but only abnormal results are displayed) Labs Reviewed - No data to display  EKG   Radiology No results found.  Procedures Procedures (including critical care time)  Medications Ordered in UC Medications - No data to display  Initial Impression / Assessment and Plan / UC Course  I have reviewed the triage vital signs and the nursing notes.  Pertinent labs & imaging results that were available during my care of the patient were reviewed by me and considered in my medical decision making (see chart for details).  Viral URI with cough  1.  COVID test pending from [facility], will not retest today 2.  Declined oral and IM steroids, reviewed prednisone allergy 3.  Budesonide inhaler 1 puff twice a day, continue use of albuterol inhaler as prescribed 4.  Tessalon 100 mg 3 times daily prn Final Clinical Impressions(s) / UC Diagnoses   Final diagnoses:  Viral URI with cough     Discharge Instructions     Use inhaler 1 puff twice a day while sick  Continue use of albuterol inhaler as prescribed   Can use tessalon pill every 8 hours while continuing use of robitussin          ED Prescriptions    Medication Sig Dispense Auth. Provider   Budesonide 90 MCG/ACT inhaler Inhale 1 puff  into the lungs 2 (two) times daily. 1 each Valinda Hoar, NP   benzonatate (TESSALON) 100 MG capsule Take 1 capsule (100 mg total) by mouth every 8 (eight) hours. 21 capsule Matson Welch, Elita Boone, NP     PDMP not reviewed this encounter.   Valinda Hoar, NP 07/27/20 1038

## 2020-07-27 NOTE — Discharge Instructions (Addendum)
Use inhaler 1 puff twice a day while sick  Continue use of albuterol inhaler as prescribed   Can use tessalon pill every 8 hours while continuing use of robitussin

## 2020-07-27 NOTE — ED Triage Notes (Signed)
Pt c/o chest pains, shob/wheezing (reports having asthma), coughing up phlegm, left arm pain, fatigue for about 7 days. Reports has been around someone with covid.

## 2020-09-09 ENCOUNTER — Telehealth: Payer: Self-pay

## 2020-09-09 DIAGNOSIS — J4520 Mild intermittent asthma, uncomplicated: Secondary | ICD-10-CM

## 2020-09-09 DIAGNOSIS — S29011A Strain of muscle and tendon of front wall of thorax, initial encounter: Secondary | ICD-10-CM

## 2020-09-09 DIAGNOSIS — K0889 Other specified disorders of teeth and supporting structures: Secondary | ICD-10-CM

## 2020-09-09 DIAGNOSIS — K047 Periapical abscess without sinus: Secondary | ICD-10-CM

## 2020-09-09 DIAGNOSIS — K029 Dental caries, unspecified: Secondary | ICD-10-CM

## 2020-09-09 NOTE — Telephone Encounter (Signed)
Pt said that he needs ALL his med's to be refilled

## 2020-09-13 MED ORDER — ALBUTEROL SULFATE HFA 108 (90 BASE) MCG/ACT IN AERS: 2.0000 | INHALATION_SPRAY | Freq: Four times a day (QID) | RESPIRATORY_TRACT | 11 refills | Status: DC | PRN

## 2020-09-13 MED ORDER — IBUPROFEN 800 MG PO TABS: 800.0000 mg | ORAL_TABLET | Freq: Three times a day (TID) | ORAL | 3 refills | Status: DC | PRN

## 2020-09-13 MED ORDER — OMEPRAZOLE 20 MG PO CPDR: 20.0000 mg | DELAYED_RELEASE_CAPSULE | Freq: Every day | ORAL | 3 refills | Status: DC

## 2020-09-13 MED ORDER — CETIRIZINE HCL 10 MG PO TABS: 10.0000 mg | ORAL_TABLET | Freq: Every day | ORAL | 3 refills | Status: AC

## 2020-09-13 MED ORDER — CYCLOBENZAPRINE HCL 10 MG PO TABS: 10.0000 mg | ORAL_TABLET | Freq: Three times a day (TID) | ORAL | 3 refills | Status: DC | PRN

## 2020-09-13 NOTE — Addendum Note (Signed)
Addended by: Eduard Clos on: 09/13/2020 03:59 PM   Modules accepted: Orders

## 2020-09-14 IMAGING — DX DG CHEST 2V
2 series · 2 of 2 positions shown · non-contrast
Comparison: 09/03/2016

CLINICAL DATA: Chest pain, shortness of breath

EXAM:
CHEST - 2 VIEW

[chest pa]
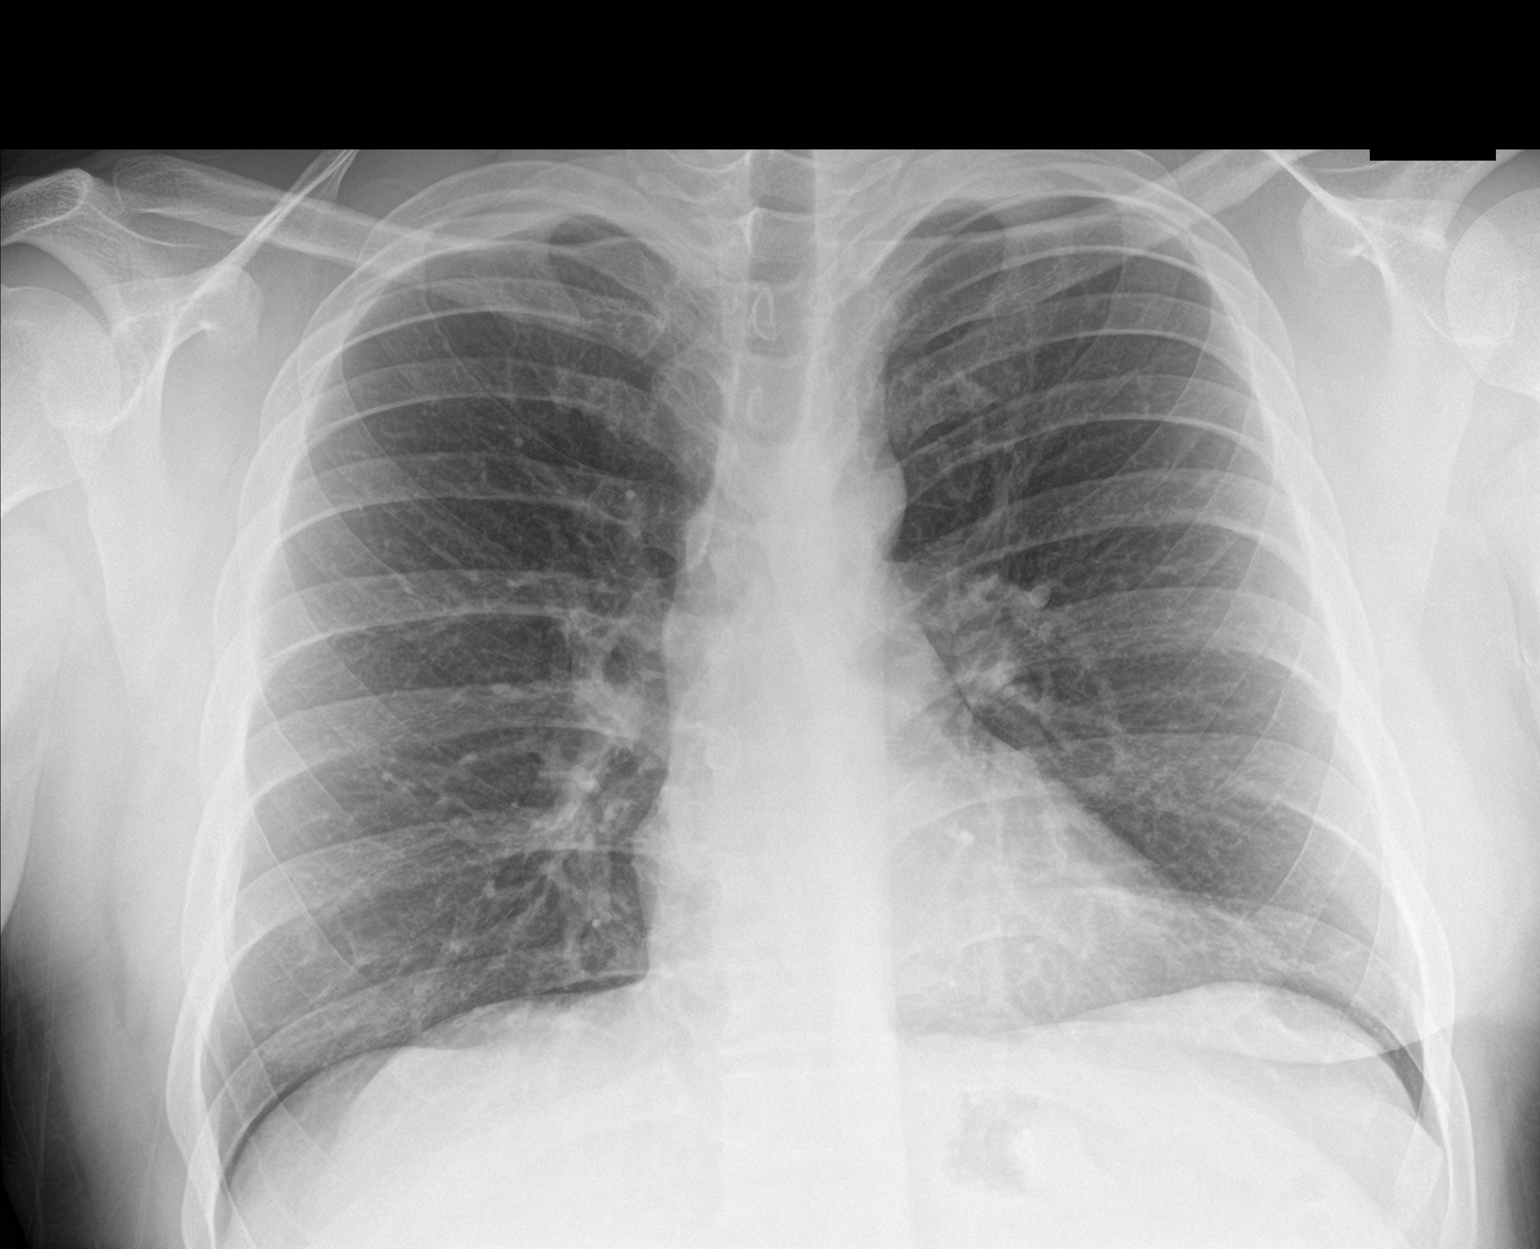

[chest lat]
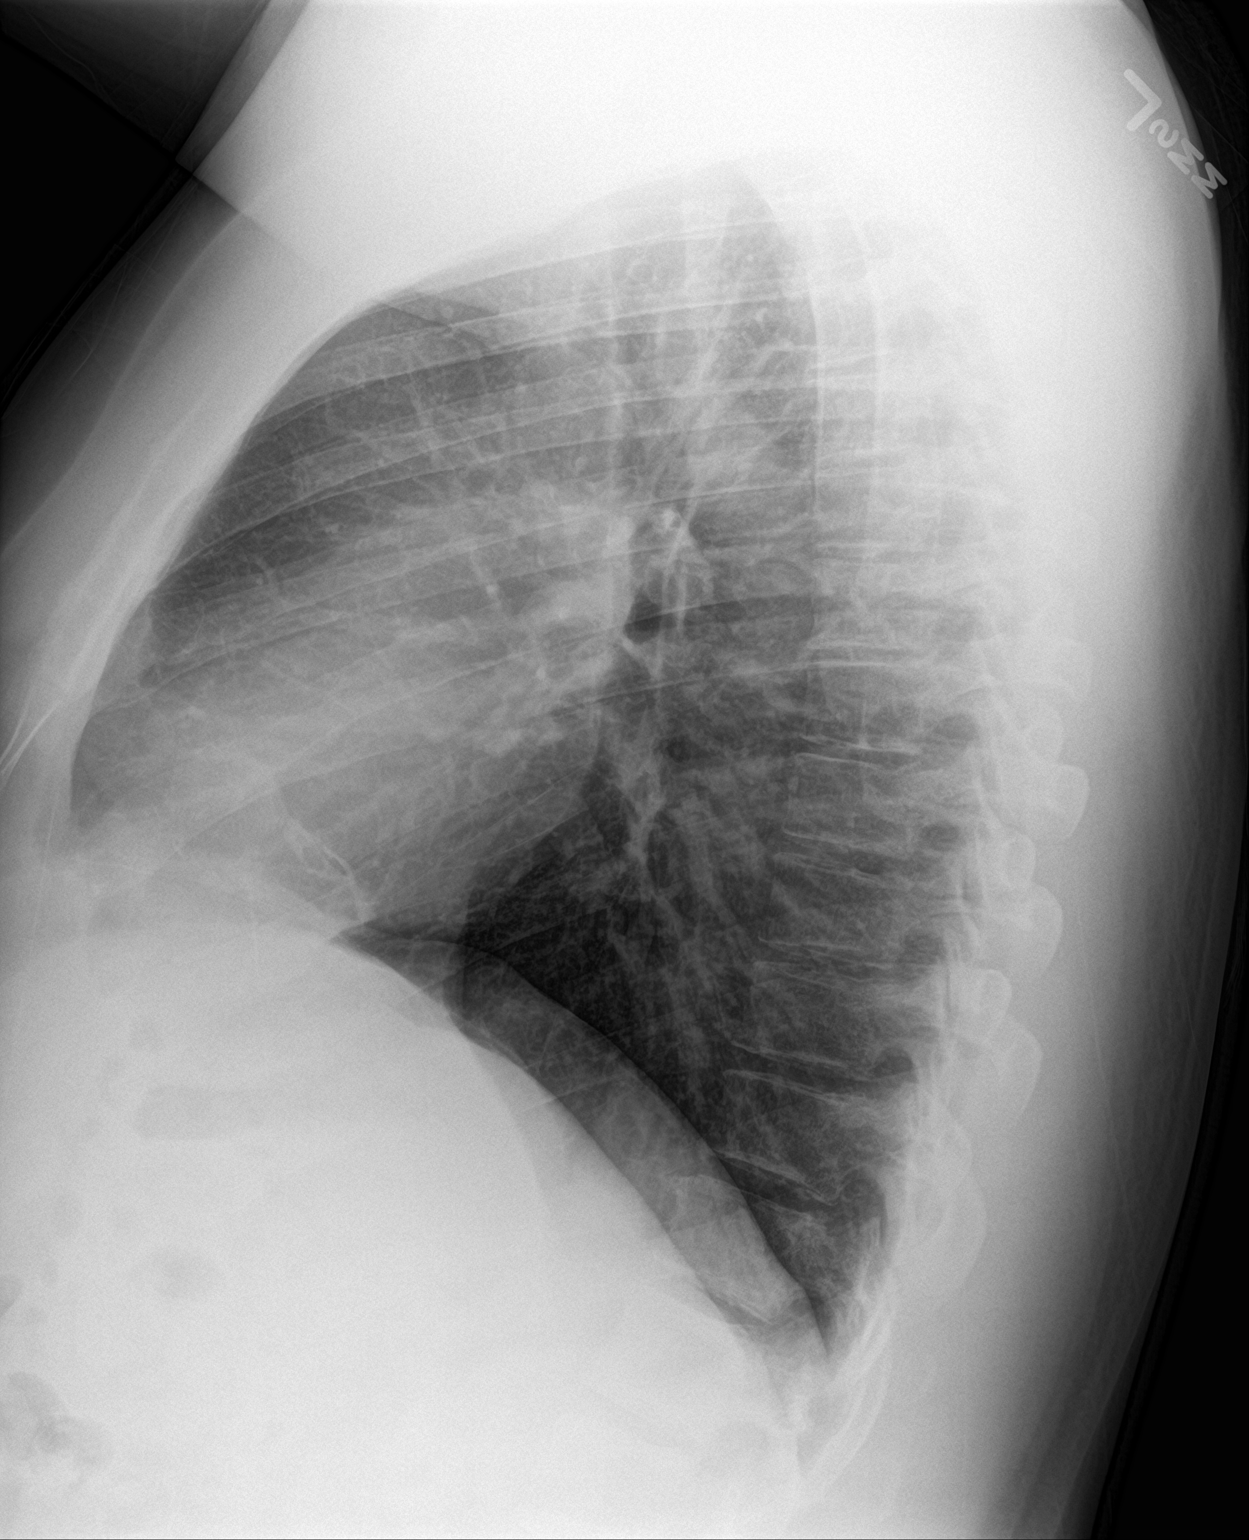

[2 of 2 positions shown; findings below may reference images not displayed]

FINDINGS: There is mild lingular atelectasis. There is no focal consolidation.
There is no pleural effusion or pneumothorax. The heart and
mediastinal contours are unremarkable.

There is no acute osseous abnormality.
IMPRESSION: No active cardiopulmonary disease.

## 2020-10-28 ENCOUNTER — Encounter (HOSPITAL_COMMUNITY): Payer: Self-pay

## 2020-10-28 ENCOUNTER — Other Ambulatory Visit: Payer: Self-pay

## 2020-10-28 ENCOUNTER — Ambulatory Visit (HOSPITAL_COMMUNITY)
Admission: EM | Admit: 2020-10-28 | Discharge: 2020-10-28 | Disposition: A | Discharge status: Home / Self Care | Payer: 59 | Attending: Emergency Medicine | Admitting: Emergency Medicine

## 2020-10-28 DIAGNOSIS — M940 Chondrocostal junction syndrome [Tietze]: Secondary | ICD-10-CM

## 2020-10-28 MED ORDER — IBUPROFEN 800 MG PO TABS: 800.0000 mg | ORAL_TABLET | Freq: Three times a day (TID) | ORAL | 0 refills | Status: DC

## 2020-10-28 NOTE — ED Triage Notes (Signed)
T reports right sided chest pain, on and off x 4 days. Pain is worse when sneezing.    Pt reports nausea, fatigue,  lightheadedness x 2-3 months.; tingling in hands and leg when sitting for an extended period of time.

## 2020-10-28 NOTE — ED Provider Notes (Signed)
[facility]    CSN: [id number] Arrival date & time: 10/28/20  1345      History   Chief Complaint Chief Complaint  Patient presents with   Chest Pain   Numbness    HPI Walter Howell is a 35 y.o. male.   Patient here for evaluation of right-sided chest pain that has been ongoing for the past 4 days.  Reports pain is worse with deep breaths, sneezing/coughing, and when leaning forward.  Denies similar symptoms in the past.  Reports taking OTC pain medication with minimal symptom relief.  Denies any trauma, injury, or other precipitating event.  Denies any specific alleviating or aggravating factors.  Denies any fevers, chest pain, shortness of breath, N/V/D, numbness, tingling, weakness, abdominal pain, or headaches.    The history is provided by the patient.  Chest Pain  Past Medical History:  Diagnosis Date   Anxiety    Asthma    Coronavirus infection 01/2020   COVID    Dizziness    GERD (gastroesophageal reflux disease)    Hypertension    Seasonal allergies    Vertigo    Vitamin D deficiency     Patient Active Problem List   Diagnosis Date Noted   Mild intermittent asthma without complication 09/04/2019   Obesity (BMI 35.0-39.9 without comorbidity) 09/04/2019   Benign paroxysmal positional vertigo 07/23/2017   TMJ (temporomandibular joint disorder) 07/23/2017   Gastroesophageal reflux disease without esophagitis 07/23/2017   History of herniated intervertebral disc 12/25/2016   Palpitations 12/25/2016   Anxiety and depression 12/06/2015   Chronic bilateral low back pain without sciatica 12/06/2015   Essential hypertension 07/08/2013    History reviewed. No pertinent surgical history.     Home Medications    Prior to Admission medications   Medication Sig Start Date End Date Taking? Authorizing Provider  ibuprofen (ADVIL) 800 MG tablet Take 1 tablet (800 mg total) by mouth 3 (three) times daily. 10/28/20  Yes Ivette Loyal, NP   acetaminophen (TYLENOL) 500 MG tablet Take 1,000 mg by mouth every 6 (six) hours as needed for mild pain, moderate pain, fever or headache.    [provider]  albuterol (VENTOLIN HFA) 108 (90 Base) MCG/ACT inhaler Inhale 2 puffs into the lungs every 6 (six) hours as needed for wheezing or shortness of breath. 09/13/20   Barbette Merino, NP  benzonatate (TESSALON) 100 MG capsule Take 1 capsule (100 mg total) by mouth every 8 (eight) hours. 07/27/20   White, Elita Boone, NP  Budesonide 90 MCG/ACT inhaler Inhale 1 puff into the lungs 2 (two) times daily. 07/27/20   White, Elita Boone, NP  busPIRone (BUSPAR) 15 MG tablet Take 1 tablet (15 mg total) by mouth 2 (two) times daily. 02/12/19   Anders Simmonds, PA-C  cetirizine (ZYRTEC) 10 MG tablet Take 1 tablet (10 mg total) by mouth daily. 09/13/20   Barbette Merino, NP  cyclobenzaprine (FLEXERIL) 10 MG tablet Take 1 tablet (10 mg total) by mouth 3 (three) times daily as needed for muscle spasms. 09/13/20   Barbette Merino, NP  fluticasone (FLONASE) 50 MCG/ACT nasal spray Place 1-2 sprays into both nostrils daily for 7 days. 03/30/20 04/06/20  Kallie Locks, FNP  hydrocortisone cream 1 % Apply 1 application topically 2 (two) times daily. 05/02/20   Kallie Locks, FNP  lisinopril-hydrochlorothiazide (ZESTORETIC) 20-25 MG tablet Take 1 tablet by mouth daily. 05/23/20   Kallie Locks, FNP  meclizine (ANTIVERT) 25 MG tablet  Take 1 tablet (25 mg total) by mouth 3 (three) times daily as needed for dizziness. 11/23/19   Kallie Locks, FNP  Multiple Vitamin (MULTIVITAMIN WITH MINERALS) TABS tablet Take 1 tablet by mouth daily.    [provider]  omeprazole (PRILOSEC) 20 MG capsule Take 1 capsule (20 mg total) by mouth daily. 09/13/20   Barbette Merino, NP  Vitamin D, Ergocalciferol, (DRISDOL) 1.25 MG (50000 UT) CAPS capsule Take 1 capsule (50,000 Units total) by mouth every 7 (seven) days. 02/17/19   Anders Simmonds, PA-C    Family  History Family History  Problem Relation Age of Onset   Diabetes Maternal Grandmother    Hypertension Maternal Grandmother    Diabetes Maternal Grandfather    Diabetes Paternal Grandmother    Heart disease Paternal Grandmother    Diabetes Paternal Grandfather     Social History Social History   Tobacco Use   Smoking status: Former   Smokeless tobacco: Never  Building services engineer Use: Never used  Substance Use Topics   Alcohol use: No   Drug use: No     Allergies   Prednisone   Review of Systems Review of Systems  Cardiovascular:  Positive for chest pain.  All other systems reviewed and are negative.   Physical Exam Triage Vital Signs ED Triage Vitals  Enc Vitals Group     BP 10/28/20 1353 131/89     Pulse Rate 10/28/20 1353 68     Resp 10/28/20 1353 18     Temp 10/28/20 1353 98 F (36.7 C)     Temp Source 10/28/20 1353 Oral     SpO2 10/28/20 1353 96 %     Weight --      Height --      Head Circumference --      Peak Flow --      Pain Score 10/28/20 1352 8     Pain Loc --      Pain Edu? --      Excl. in GC? --    No data found.  Updated Vital Signs BP 131/89 (BP Location: Left Arm)   Pulse 68   Temp 98 F (36.7 C) (Oral)   Resp 18   SpO2 96%   Visual Acuity Right Eye Distance:   Left Eye Distance:   Bilateral Distance:    Right Eye Near:   Left Eye Near:    Bilateral Near:     Physical Exam Vitals and nursing note reviewed.  Constitutional:      General: He is not in acute distress.    Appearance: Normal appearance. He is not ill-appearing, toxic-appearing or diaphoretic.  HENT:     Head: Normocephalic and atraumatic.  Eyes:     Conjunctiva/sclera: Conjunctivae normal.  Cardiovascular:     Rate and Rhythm: Regular rhythm. Bradycardia present.     Pulses: Normal pulses.     Heart sounds: Normal heart sounds.  Pulmonary:     Effort: Pulmonary effort is normal.     Breath sounds: Normal breath sounds.  Chest:     Chest wall:  Tenderness (right sided chest pain and right sternal chest pain that is reproducible on palpation) present. No mass, deformity, swelling, crepitus or edema.  Abdominal:     General: Abdomen is flat.  Musculoskeletal:        General: Normal range of motion.     Cervical back: Normal range of motion.  Skin:    General: Skin is  warm and dry.  Neurological:     General: No focal deficit present.     Mental Status: He is alert and oriented to person, place, and time.  Psychiatric:        Mood and Affect: Mood normal.     UC Treatments / Results  Labs (all labs ordered are listed, but only abnormal results are displayed) Labs Reviewed - No data to display  EKG   Radiology No results found.  Procedures Procedures (including critical care time)  Medications Ordered in UC Medications - No data to display  Initial Impression / Assessment and Plan / UC Course  I have reviewed the triage vital signs and the nursing notes.  Pertinent labs & imaging results that were available during my care of the patient were reviewed by me and considered in my medical decision making (see chart for details).    Costochondritis.  Assessment negative for red flags or concerns.  EKG sinus bradycardia with a regular rhythm.  Will treat with ibuprofen 3 times a day for the next 5 days and then as needed.  May also take Tylenol.  Recommend heat for comfort.  Discussed conservative symptom management including icy hot or lidocaine patches.  Strict ED follow-up for any red flag symptoms. Final Clinical Impressions(s) / UC Diagnoses   Final diagnoses:  Costochondritis     Discharge Instructions      Take the Ibuprofen 3 times a day for the next 5 days and then as needed. You can also take Tylenol as needed.   You can use a warm compress or heating pad to help with pain.  You can try IcyHot, lidocaine patches, or Biofreeze as needed for comfort.  If you develop the worse headache of your life,  worsening dizziness, nausea/vomiting, blurred vision, slurred speech, difficulty walking, weakness on one side, chest pain, shortness of breath, or altered mental status, call 911 or go directly to the Emergency Department for further evaluation.   Return or go to the Emergency Department if symptoms worsen or do not improve in the next few days.      ED Prescriptions     Medication Sig Dispense Auth. Provider   ibuprofen (ADVIL) 800 MG tablet Take 1 tablet (800 mg total) by mouth 3 (three) times daily. 21 tablet Ivette Loyal, NP      PDMP not reviewed this encounter.   Ivette Loyal, NP 10/28/20 [phone]

## 2020-10-28 NOTE — Discharge Instructions (Addendum)
Take the Ibuprofen 3 times a day for the next 5 days and then as needed. You can also take Tylenol as needed.   You can use a warm compress or heating pad to help with pain.  You can try IcyHot, lidocaine patches, or Biofreeze as needed for comfort.  If you develop the worse headache of your life, worsening dizziness, nausea/vomiting, blurred vision, slurred speech, difficulty walking, weakness on one side, chest pain, shortness of breath, or altered mental status, call 911 or go directly to the Emergency Department for further evaluation.   Return or go to the Emergency Department if symptoms worsen or do not improve in the next few days.

## 2020-11-10 ENCOUNTER — Encounter: Payer: Self-pay | Admitting: Nurse Practitioner

## 2020-11-10 ENCOUNTER — Ambulatory Visit (HOSPITAL_COMMUNITY)
Admission: RE | Admit: 2020-11-10 | Discharge: 2020-11-10 | Disposition: A | Discharge status: Home / Self Care | Payer: 59 | Source: Ambulatory Visit | Attending: Nurse Practitioner | Admitting: Nurse Practitioner

## 2020-11-10 ENCOUNTER — Other Ambulatory Visit: Payer: Self-pay

## 2020-11-10 ENCOUNTER — Ambulatory Visit (INDEPENDENT_AMBULATORY_CARE_PROVIDER_SITE_OTHER): Payer: 59 | Admitting: Nurse Practitioner

## 2020-11-10 VITALS — BP 131/84 | HR 57 | Temp 97.6°F | Ht 71.0 in | Wt 270.0 lb

## 2020-11-10 DIAGNOSIS — I10 Essential (primary) hypertension: Secondary | ICD-10-CM | POA: Diagnosis not present

## 2020-11-10 DIAGNOSIS — U099 Post covid-19 condition, unspecified: Secondary | ICD-10-CM | POA: Insufficient documentation

## 2020-11-10 DIAGNOSIS — Z113 Encounter for screening for infections with a predominantly sexual mode of transmission: Secondary | ICD-10-CM

## 2020-11-10 DIAGNOSIS — R0609 Other forms of dyspnea: Secondary | ICD-10-CM | POA: Insufficient documentation

## 2020-11-10 DIAGNOSIS — R079 Chest pain, unspecified: Secondary | ICD-10-CM | POA: Diagnosis not present

## 2020-11-10 DIAGNOSIS — E669 Obesity, unspecified: Secondary | ICD-10-CM | POA: Diagnosis not present

## 2020-11-10 DIAGNOSIS — E559 Vitamin D deficiency, unspecified: Secondary | ICD-10-CM

## 2020-11-10 DIAGNOSIS — E7800 Pure hypercholesterolemia, unspecified: Secondary | ICD-10-CM

## 2020-11-10 LAB — POCT GLYCOSYLATED HEMOGLOBIN (HGB A1C): Hemoglobin A1C: 5.5 % (ref 4.0–5.6)

## 2020-11-10 MED ORDER — LOSARTAN POTASSIUM-HCTZ 50-12.5 MG PO TABS
1.0000 | ORAL_TABLET | Freq: Every day | ORAL | 3 refills | Status: DC
Start: 2020-11-10 — End: 2021-05-15

## 2020-11-10 MED ORDER — VITAMIN D (ERGOCALCIFEROL) 1.25 MG (50000 UNIT) PO CAPS: 50000.0000 [IU] | ORAL_CAPSULE | ORAL | 0 refills | Status: DC

## 2020-11-10 NOTE — Patient Instructions (Signed)
Managing Your Hypertension Hypertension, also called high blood pressure, is when the force of the blood pressing against the walls of the arteries is too strong. Arteries are blood vessels that carry blood from your heart throughout your body. Hypertension forces the heart to work harder to pump blood and may cause the arteries to become narrow or stiff. Understanding blood pressure readings Your personal target blood pressure may vary depending on your medical conditions, your age, and other factors. A blood pressure reading includes a higher number over a lower number. Ideally, your blood pressure should be below 120/80. You should know that: The first, or top, number is called the systolic pressure. It is a measure of the pressure in your arteries as your heart beats. The second, or bottom number, is called the diastolic pressure. It is a measure of the pressure in your arteries as the heart relaxes. Blood pressure is classified into four stages. Based on your blood pressure reading, your health care provider may use the following stages to determine what type of treatment you need, if any. Systolic pressure and diastolic pressure are measured in a unit called mmHg. Normal Systolic pressure: below 120. Diastolic pressure: below 80. Elevated Systolic pressure: 120-129. Diastolic pressure: below 80. Hypertension stage 1 Systolic pressure: 130-139. Diastolic pressure: 80-89. Hypertension stage 2 Systolic pressure: 140 or above. Diastolic pressure: 90 or above. How can this condition affect me? Managing your hypertension is an important responsibility. Over time, hypertension can damage the arteries and decrease blood flow to important parts of the body, including the brain, heart, and kidneys. Having untreated or uncontrolled hypertension can lead to: A heart attack. A stroke. A weakened blood vessel (aneurysm). Heart failure. Kidney damage. Eye damage. Metabolic syndrome. Memory and  concentration problems. Vascular dementia. What actions can I take to manage this condition? Hypertension can be managed by making lifestyle changes and possibly by taking medicines. Your health care provider will help you make a plan to bring your blood pressure within a normal range. Nutrition  Eat a diet that is high in fiber and potassium, and low in salt (sodium), added sugar, and fat. An example eating plan is called the Dietary Approaches to Stop Hypertension (DASH) diet. To eat this way: Eat plenty of fresh fruits and vegetables. Try to fill one-half of your plate at each meal with fruits and vegetables. Eat whole grains, such as whole-wheat pasta, brown rice, or whole-grain bread. Fill about one-fourth of your plate with whole grains. Eat low-fat dairy products. Avoid fatty cuts of meat, processed or cured meats, and poultry with skin. Fill about one-fourth of your plate with lean proteins such as fish, chicken without skin, beans, eggs, and tofu. Avoid pre-made and processed foods. These tend to be higher in sodium, added sugar, and fat. Reduce your daily sodium intake. Most people with hypertension should eat less than 1,500 mg of sodium a day. Lifestyle  Work with your health care provider to maintain a healthy body weight or to lose weight. Ask what an ideal weight is for you. Get at least 30 minutes of exercise that causes your heart to beat faster (aerobic exercise) most days of the week. Activities may include walking, swimming, or biking. Include exercise to strengthen your muscles (resistance exercise), such as weight lifting, as part of your weekly exercise routine. Try to do these types of exercises for 30 minutes at least 3 days a week. Do not use any products that contain nicotine or tobacco, such as cigarettes, e-cigarettes,   and chewing tobacco. If you need help quitting, ask your health care provider. Control any long-term (chronic) conditions you have, such as high  cholesterol or diabetes. Identify your sources of stress and find ways to manage stress. This may include meditation, deep breathing, or making time for fun activities. Alcohol use Do not drink alcohol if: Your health care provider tells you not to drink. You are pregnant, may be pregnant, or are planning to become pregnant. If you drink alcohol: Limit how much you use to: 0-1 drink a day for women. 0-2 drinks a day for men. Be aware of how much alcohol is in your drink. In the [place] one drink equals one 12 oz bottle of beer (355 mL), one 5 oz glass of wine (148 mL), or one 1 oz glass of hard liquor (44 mL). Medicines Your health care provider may prescribe medicine if lifestyle changes are not enough to get your blood pressure under control and if: Your systolic blood pressure is 130 or higher. Your diastolic blood pressure is 80 or higher. Take medicines only as told by your health care provider. Follow the directions carefully. Blood pressure medicines must be taken as told by your health care provider. The medicine does not work as well when you skip doses. Skipping doses also puts you at risk for problems. Monitoring Before you monitor your blood pressure: Do not smoke, drink caffeinated beverages, or exercise within 30 minutes before taking a measurement. Use the bathroom and empty your bladder (urinate). Sit quietly for at least 5 minutes before taking measurements. Monitor your blood pressure at home as told by your health care provider. To do this: Sit with your back straight and supported. Place your feet flat on the floor. Do not cross your legs. Support your arm on a flat surface, such as a table. Make sure your upper arm is at heart level. Each time you measure, take two or three readings one minute apart and record the results. You may also need to have your blood pressure checked regularly by your health care provider. General information Talk with your health care  provider about your diet, exercise habits, and other lifestyle factors that may be contributing to hypertension. Review all the medicines you take with your health care provider because there may be side effects or interactions. Keep all visits as told by your health care provider. Your health care provider can help you create and adjust your plan for managing your high blood pressure. Where to find more information National Heart, Lung, and Blood Institute: www.nhlbi.nih.gov American Heart Association: www.heart.org Contact a health care provider if: You think you are having a reaction to medicines you have taken. You have repeated (recurrent) headaches. You feel dizzy. You have swelling in your ankles. You have trouble with your vision. Get help right away if: You develop a severe headache or confusion. You have unusual weakness or numbness, or you feel faint. You have severe pain in your chest or abdomen. You vomit repeatedly. You have trouble breathing. These symptoms may represent a serious problem that is an emergency. Do not wait to see if the symptoms will go away. Get medical help right away. Call your local emergency services (911 in the [place] Do not drive yourself to the hospital. Summary Hypertension is when the force of blood pumping through your arteries is too strong. If this condition is not controlled, it may put you at risk for serious complications. Your personal target blood pressure may vary depending on   your medical conditions, your age, and other factors. For most people, a normal blood pressure is less than 120/80. Hypertension is managed by lifestyle changes, medicines, or both. Lifestyle changes to help manage hypertension include losing weight, eating a healthy, low-sodium diet, exercising more, stopping smoking, and limiting alcohol. This information is not intended to replace advice given to you by your health care provider. Make sure you discuss any questions  you have with your health care provider. Document Revised: 03/20/2019 Document Reviewed: 01/13/2019 Elsevier Patient Education  2022 [employer] Inc[employer]  Nonspecific Chest Pain Chest pain can be caused by many different conditions. Some causes of chest pain can be life-threatening. These will require treatment right away. Serious causes of chest pain include: Heart attack. A tear in the body's main blood vessel. Redness and swelling (inflammation) around your heart. Blood clot in your lungs. Other causes of chest pain may not be so serious. These include: Heartburn. Anxiety or stress. Damage to bones or muscles in your chest. Lung infections. Chest pain can feel like: Pain or discomfort in your chest. Crushing, pressure, aching, or squeezing pain. Burning or tingling. Dull or sharp pain that is worse when you move, cough, or take a deep breath. Pain or discomfort that is also felt in your back, neck, jaw, shoulder, or arm, or pain that spreads to any of these areas. It is hard to know whether your pain is caused by something that is serious or something that is not so serious. So it is important to see your doctor right away if you have chest pain. Follow these instructions at home: Medicines Take over-the-counter and prescription medicines only as told by your doctor. If you were prescribed an antibiotic medicine, take it as told by your doctor. Do not stop taking the antibiotic even if you start to feel better. Lifestyle  Rest as told by your doctor. Do not use any products that contain nicotine or tobacco, such as cigarettes, e-cigarettes, and chewing tobacco. If you need help quitting, ask your doctor. Do not drink alcohol. Make lifestyle changes as told by your doctor. These may include: Getting regular exercise. Ask your doctor what activities are safe for you. Eating a heart-healthy diet. A diet and nutrition specialist (dietitian) can help you to learn healthy eating  options. Staying at a healthy weight. Treating diabetes or high blood pressure, if needed. Lowering your stress. Activities such as yoga and relaxation techniques can help. General instructions Pay attention to any changes in your symptoms. Tell your doctor about them or any new symptoms. Avoid any activities that cause chest pain. Keep all follow-up visits as told by your doctor. This is important. You may need more testing if your chest pain does not go away. Contact a doctor if: Your chest pain does not go away. You feel depressed. You have a fever. Get help right away if: Your chest pain is worse. You have a cough that gets worse, or you cough up blood. You have very bad (severe) pain in your belly (abdomen). You pass out (faint). You have either of these for no clear reason: Sudden chest discomfort. Sudden discomfort in your arms, back, neck, or jaw. You have shortness of breath at any time. You suddenly start to sweat, or your skin gets clammy. You feel sick to your stomach (nauseous). You throw up (vomit). You suddenly feel lightheaded or dizzy. You feel very weak or tired. Your heart starts to beat fast, or it feels like it is skipping beats. These  symptoms may be an emergency. Do not wait to see if the symptoms will go away. Get medical help right away. Call your local emergency services (911 in the [place] Do not drive yourself to the hospital. Summary Chest pain can be caused by many different conditions. The cause may be serious and need treatment right away. If you have chest pain, see your doctor right away. Follow your doctor's instructions for taking medicines and making lifestyle changes. Keep all follow-up visits as told by your doctor. This includes visits for any further testing if your chest pain does not go away. Be sure to know the signs that show that your condition has become worse. Get help right away if you have these symptoms. This information is not  intended to replace advice given to you by your health care provider. Make sure you discuss any questions you have with your health care provider. Document Revised: 04/28/2020 Document Reviewed: 04/28/2020 Elsevier Patient Education  2022 [employer].

## 2020-11-10 NOTE — Progress Notes (Signed)
[facility] Patient [facility] [address] [place], [place]  [address] Phone:  [phone]   Fax:  [phone]   Established Patient Office Visit  Subjective:  Patient ID: Walter Howell, male    DOB: August 04, 1985  Age: 35 y.o. MRN: [id number]  CC:  Chief Complaint  Patient presents with   Chest Pain    Urgent care 9/2;chest pain comes and goes. Requesting std testing.    HPI Walter Howell presents for follow up. He  has a past medical history of Anxiety, Asthma, Coronavirus infection (01/2020), COVID, Dizziness, GERD (gastroesophageal reflux disease), Hypertension, Seasonal allergies, Vertigo, and Vitamin D deficiency.    Chest Pain Walter Howell complains of chest pain. Onset was 3 weeks ago. Symptoms have been unchanged since that time. The patient's pain is intermittent. The patient describes the pain as dull and does not radiate. Patient rates pain as a 5/10 in intensity. Associated symptoms are: dyspnea and nausea . The nausea is related to vertigo. On Meclizine which is effective. Aggravating factors are:  bending . Alleviating factors are: NSAIDs, rest, and muscle relaxer . Patient's cardiac risk factors are: hypertension, male gender, obesity (BMI >= 30 kg/m2), sedentary lifestyle, and smoking/ tobacco exposure. Patient's risk factors for DVT/PE:  none . Previous cardiac testing: electrocardiogram (ECG). He smoke marijuana occasionally , denies any ETOH use. Denies any herbals or any other medications not in medlist.   Past Medical History:  Diagnosis Date   Anxiety    Asthma    Coronavirus infection 01/2020   COVID    Dizziness    GERD (gastroesophageal reflux disease)    Hypertension    Seasonal allergies    Vertigo    Vitamin D deficiency     History reviewed. No pertinent surgical history.  Family History  Problem Relation Age of Onset   Diabetes Maternal Grandmother    Hypertension Maternal Grandmother    Diabetes Maternal Grandfather     Diabetes Paternal Grandmother    Heart disease Paternal Grandmother    Diabetes Paternal Grandfather     Social History   Socioeconomic History   Marital status: Significant Other    Spouse name: Not on file   Number of children: Not on file   Years of education: Not on file   Highest education level: Not on file  Occupational History   Not on file  Tobacco Use   Smoking status: Former   Smokeless tobacco: Never  Vaping Use   Vaping Use: Never used  Substance and Sexual Activity   Alcohol use: No   Drug use: Yes    Types: Marijuana   Sexual activity: Yes  Other Topics Concern   Not on file  Social History Narrative   Not on file   Social Determinants of Health   Financial Resource Strain: Not on file  Food Insecurity: Not on file  Transportation Needs: Not on file  Physical Activity: Not on file  Stress: Not on file  Social Connections: Not on file  Intimate Partner Violence: Not on file    Outpatient Medications Prior to Visit  Medication Sig Dispense Refill   acetaminophen (TYLENOL) 500 MG tablet Take 1,000 mg by mouth every 6 (six) hours as needed for mild pain, moderate pain, fever or headache.     albuterol (VENTOLIN HFA) 108 (90 Base) MCG/ACT inhaler Inhale 2 puffs into the lungs every 6 (six) hours as needed for wheezing or shortness of breath. 18 g 11  Budesonide 90 MCG/ACT inhaler Inhale 1 puff into the lungs 2 (two) times daily. 1 each 0   busPIRone (BUSPAR) 15 MG tablet Take 1 tablet (15 mg total) by mouth 2 (two) times daily. 60 tablet 3   cetirizine (ZYRTEC) 10 MG tablet Take 1 tablet (10 mg total) by mouth daily. 90 tablet 3   cyclobenzaprine (FLEXERIL) 10 MG tablet Take 1 tablet (10 mg total) by mouth 3 (three) times daily as needed for muscle spasms. 30 tablet 3   ibuprofen (ADVIL) 800 MG tablet Take 1 tablet (800 mg total) by mouth 3 (three) times daily. 21 tablet 0   meclizine (ANTIVERT) 25 MG tablet Take 1 tablet (25 mg total) by mouth 3  (three) times daily as needed for dizziness. 90 tablet 6   Multiple Vitamin (MULTIVITAMIN WITH MINERALS) TABS tablet Take 1 tablet by mouth daily.     omeprazole (PRILOSEC) 20 MG capsule Take 1 capsule (20 mg total) by mouth daily. 90 capsule 3   Vitamin D, Ergocalciferol, (DRISDOL) 1.25 MG (50000 UT) CAPS capsule Take 1 capsule (50,000 Units total) by mouth every 7 (seven) days. 16 capsule 0   lisinopril-hydrochlorothiazide (ZESTORETIC) 10-12.5 MG tablet Take 1 tablet by mouth daily.     fluticasone (FLONASE) 50 MCG/ACT nasal spray Place 1-2 sprays into both nostrils daily for 7 days. 9.9 g 11   hydrOXYzine (ATARAX/VISTARIL) 25 MG tablet Take 25 mg by mouth 3 (three) times daily.     benzonatate (TESSALON) 100 MG capsule Take 1 capsule (100 mg total) by mouth every 8 (eight) hours. 21 capsule 0   hydrocortisone cream 1 % Apply 1 application topically 2 (two) times daily. 30 g 2   lisinopril-hydrochlorothiazide (ZESTORETIC) 20-25 MG tablet Take 1 tablet by mouth daily. (Patient not taking: Reported on 11/10/2020) 90 tablet 3   No facility-administered medications prior to visit.    Allergies  Allergen Reactions   Prednisone     "makes him feel weird"    ROS Review of Systems    Objective:    Physical Exam Constitutional:      Appearance: He is obese.  HENT:     Head: Normocephalic and atraumatic.  Eyes:     Pupils: Pupils are equal, round, and reactive to light.  Cardiovascular:     Rate and Rhythm: Regular rhythm. Bradycardia present.     Pulses:          Radial pulses are 2+ on the right side and 2+ on the left side.       Dorsalis pedis pulses are 2+ on the right side and 2+ on the left side.       Posterior tibial pulses are 2+ on the right side and 2+ on the left side.     Heart sounds: Normal heart sounds.  Pulmonary:     Effort: Pulmonary effort is normal.     Breath sounds: Examination of the right-lower field reveals decreased breath sounds. Examination of the  left-lower field reveals decreased breath sounds. Decreased breath sounds present. No wheezing, rhonchi or rales.     Comments: Diminished in the bases Chest:     Chest wall: No tenderness.  Abdominal:     General: Bowel sounds are normal.  Musculoskeletal:        General: Normal range of motion.     Cervical back: Normal range of motion.  Skin:    General: Skin is warm and dry.     Capillary Refill: Capillary refill takes less  than 2 seconds.  Neurological:     General: No focal deficit present.     Mental Status: He is alert.  Psychiatric:        Mood and Affect: Mood normal.        Behavior: Behavior normal.    BP 131/84 (BP Location: Right Arm, Patient Position: Sitting)   Pulse (!) 57   Temp 97.6 F (36.4 C)   Ht 5\' 11"  (1.803 m)   Wt 270 lb 0.6 oz (122.5 kg)   SpO2 100%   BMI 37.66 kg/m  Wt Readings from Last 3 Encounters:  11/10/20 270 lb 0.6 oz (122.5 kg)  05/02/20 280 lb (127 kg)  03/30/20 261 lb (118.4 kg)     Health Maintenance Due  Topic Date Due   COVID-19 Vaccine (1) Never done   Hepatitis C Screening  Never done   TETANUS/TDAP  Never done   INFLUENZA VACCINE  Never done    There are no preventive care reminders to display for this patient.  Lab Results  Component Value Date   TSH 2.720 11/23/2019   Lab Results  Component Value Date   WBC 5.8 11/23/2019   HGB 16.9 11/23/2019   HCT 50.2 11/23/2019   MCV 93 11/23/2019   PLT 247 11/23/2019   Lab Results  Component Value Date   NA 138 11/23/2019   K 3.4 (L) 11/23/2019   CO2 25 11/23/2019   GLUCOSE 106 (H) 11/23/2019   BUN 17 11/23/2019   CREATININE 1.02 11/23/2019   BILITOT 0.4 11/23/2019   ALKPHOS 45 11/23/2019   AST 27 11/23/2019   ALT 49 (H) 11/23/2019   PROT 7.7 11/23/2019   ALBUMIN 4.6 11/23/2019   CALCIUM 9.9 11/23/2019   ANIONGAP 9 01/18/2017   Lab Results  Component Value Date   CHOL 217 (H) 11/23/2019   Lab Results  Component Value Date   HDL 39 (L) 11/23/2019   Lab  Results  Component Value Date   LDLCALC 141 (H) 11/23/2019   Lab Results  Component Value Date   TRIG 207 (H) 11/23/2019   Lab Results  Component Value Date   CHOLHDL 5.6 (H) 11/23/2019   Lab Results  Component Value Date   HGBA1C 5.6 11/23/2019   HGBA1C 5.6 11/23/2019   HGBA1C 5.6 (A) 11/23/2019   HGBA1C 5.6 11/23/2019      Assessment & Plan:   Problem List Items Addressed This Visit       Cardiovascular and Mediastinum   Essential hypertension (Chronic) Persistent  Will try ARB concern about numbness and tingling on ACE  Failed Amlodipine.  Bradycardia not a good candidate for any other tx at this time. Encouraged on going compliance with current medication regimen Encouraged home monitoring and recording BP <130/80 Eating a heart-healthy diet with less salt Encouraged regular physical activity  Recommend Weight loss     Relevant Medications   losartan-hydrochlorothiazide (HYZAAR) 50-12.5 MG tablet   Other Relevant Orders   Comp. Metabolic Panel (12)     Other   Obesity (BMI 35.0-39.9 without comorbidity) Obesity with BMI and comorbidities as noted above.  Discussed proper diet (low fat, low sodium, high fiber) with patient.   Discussed need for regular exercise (3 times per week, 20 minutes per session) with patient.    Other Visit Diagnoses     Screen for STD (sexually transmitted disease)  Screening   Relevant Orders   Chlamydia/Gonococcus/Trichomonas, NAA   STD Screen (8)   Chest pain, unspecified type  Referral to cardiology due to bradycardia/HTN/obesity and elevated cholesterol for evaluation consider stress test   Relevant Orders   Lipid panel   CBC with Differential/Platelet   Post-COVID chronic dyspnea     Hx of 2 COVID infections  Vaccinated    Relevant Orders   DG Chest 2 View   Elevated cholesterol     Reevaluation  Encouraged maintain heart healthy diet Exercise Add fish oil daily   Relevant Medications    losartan-hydrochlorothiazide (HYZAAR) 50-12.5 MG tablet   Other Relevant Orders   Lipid panel       Meds ordered this encounter  Medications   losartan-hydrochlorothiazide (HYZAAR) 50-12.5 MG tablet    Sig: Take 1 tablet by mouth daily.    Dispense:  90 tablet    Refill:  3    Order Specific Question:   Supervising Provider    Answer:   Quentin Angst [id number]    Follow-up: No follow-ups on file.    Barbette Merino, NP

## 2020-11-11 LAB — LIPID PANEL
Chol/HDL Ratio: 5.3 ratio — ABNORMAL HIGH (ref 0.0–5.0)
Cholesterol, Total: 207 mg/dL — ABNORMAL HIGH (ref 100–199)
HDL: 39 mg/dL — ABNORMAL LOW (ref >39)
LDL Chol Calc (NIH): 146 mg/dL — ABNORMAL HIGH (ref 0–99)
Triglycerides: 121 mg/dL (ref 0–149)
VLDL Cholesterol Cal: 22 mg/dL (ref 5–40)

## 2020-11-11 LAB — COMP. METABOLIC PANEL (12)
AST: 26 IU/L (ref 0–40)
Albumin/Globulin Ratio: 1.8 (ref 1.2–2.2)
Albumin: 4.7 g/dL (ref 4.0–5.0)
Alkaline Phosphatase: 45 IU/L (ref 44–121)
BUN/Creatinine Ratio: 14 (ref 9–20)
BUN: 13 mg/dL (ref 6–20)
Bilirubin Total: 0.5 mg/dL (ref 0.0–1.2)
Calcium: 9.7 mg/dL (ref 8.7–10.2)
Chloride: 101 mmol/L (ref 96–106)
Creatinine, Ser: 0.94 mg/dL (ref 0.76–1.27)
Globulin, Total: 2.6 g/dL (ref 1.5–4.5)
Glucose: 101 mg/dL — ABNORMAL HIGH (ref 65–99)
Potassium: 3.8 mmol/L (ref 3.5–5.2)
Sodium: 141 mmol/L (ref 134–144)
Total Protein: 7.3 g/dL (ref 6.0–8.5)
eGFR: 110 mL/min/{1.73_m2} (ref >59)

## 2020-11-11 LAB — CBC WITH DIFFERENTIAL/PLATELET
Basophils Absolute: 0 10*3/uL (ref 0.0–0.2)
Basos: 1 %
EOS (ABSOLUTE): 0.1 10*3/uL (ref 0.0–0.4)
Eos: 2 %
Hematocrit: 47.4 % (ref 37.5–51.0)
Hemoglobin: 16.8 g/dL (ref 13.0–17.7)
Immature Grans (Abs): 0 10*3/uL (ref 0.0–0.1)
Immature Granulocytes: 0 %
Lymphocytes Absolute: 1.9 10*3/uL (ref 0.7–3.1)
Lymphs: 33 %
MCH: 31.2 pg (ref 26.6–33.0)
MCHC: 35.4 g/dL (ref 31.5–35.7)
MCV: 88 fL (ref 79–97)
Monocytes Absolute: 0.4 10*3/uL (ref 0.1–0.9)
Monocytes: 7 %
Neutrophils Absolute: 3.4 10*3/uL (ref 1.4–7.0)
Neutrophils: 57 %
Platelets: 225 10*3/uL (ref 150–450)
RBC: 5.38 x10E6/uL (ref 4.14–5.80)
RDW: 13.7 % (ref 11.6–15.4)
WBC: 5.8 10*3/uL (ref 3.4–10.8)

## 2020-11-11 LAB — VITAMIN D 25 HYDROXY (VIT D DEFICIENCY, FRACTURES): Vit D, 25-Hydroxy: 63.9 ng/mL (ref 30.0–100.0)

## 2020-11-14 LAB — CHLAMYDIA/GONOCOCCUS/TRICHOMONAS, NAA
Chlamydia by NAA: NEGATIVE
Gonococcus by NAA: NEGATIVE
Trich vag by NAA: NEGATIVE

## 2020-12-08 ENCOUNTER — Ambulatory Visit: Payer: 59 | Admitting: Nurse Practitioner

## 2020-12-25 NOTE — Progress Notes (Deleted)
Cardiology Office Note:    Date:  12/25/2020   ID:  [name], DOB [birth year], MRN [id number]  PCP:  Barbette Merino, NP   [facility] Providers Cardiologist:  Bryan Lemma, MD { Click to update primary MD,subspecialty MD or APP then REFRESH:1}    Referring MD: Barbette Merino, NP   No chief complaint on file. ***  History of Present Illness:    Walter Howell is a 35 y.o. male with a hx of htn, referred for right sided chest pain 10/28/2020   EKG 10/28/2020- NSR, Qtc 369 ms  Past Medical History:  Diagnosis Date   Anxiety    Asthma    Coronavirus infection 01/2020   COVID    Dizziness    GERD (gastroesophageal reflux disease)    Hypertension    Seasonal allergies    Vertigo    Vitamin D deficiency     No past surgical history on file.  Current Medications: No outpatient medications have been marked as taking for the 12/26/20 encounter (Appointment) with Maisie Fus, MD.     Allergies:   Prednisone   Social History   Socioeconomic History   Marital status: Significant Other    Spouse name: Not on file   Number of children: Not on file   Years of education: Not on file   Highest education level: Not on file  Occupational History   Not on file  Tobacco Use   Smoking status: Former   Smokeless tobacco: Never  Vaping Use   Vaping Use: Never used  Substance and Sexual Activity   Alcohol use: No   Drug use: Yes    Types: Marijuana   Sexual activity: Yes  Other Topics Concern   Not on file  Social History Narrative   Not on file   Social Determinants of Health   Financial Resource Strain: Not on file  Food Insecurity: Not on file  Transportation Needs: Not on file  Physical Activity: Not on file  Stress: Not on file  Social Connections: Not on file     Family History: The patient's ***family history includes Diabetes in his maternal grandfather, maternal grandmother, paternal grandfather, and paternal grandmother; Heart  disease in his paternal grandmother; Hypertension in his maternal grandmother.  ROS:   Please see the history of present illness.    *** All other systems reviewed and are negative.  EKGs/Labs/Other Studies Reviewed:    The following studies were reviewed today: ***  EKG:  EKG is *** ordered today.  The ekg ordered today demonstrates ***  Recent Labs: 11/10/2020: BUN 13; Creatinine, Ser 0.94; Hemoglobin 16.8; Platelets 225; Potassium 3.8; Sodium 141  Recent Lipid Panel    Component Value Date/Time   CHOL 207 (H) 11/10/2020 0934   TRIG 121 11/10/2020 0934   HDL 39 (L) 11/10/2020 0934   CHOLHDL 5.3 (H) 11/10/2020 0934   LDLCALC 146 (H) 11/10/2020 0934     Risk Assessment/Calculations:   {Does this patient have ATRIAL FIBRILLATION?[phone]}       Physical Exam:    VS:  There were no vitals taken for this visit.    Wt Readings from Last 3 Encounters:  11/10/20 270 lb 0.6 oz (122.5 kg)  05/02/20 280 lb (127 kg)  03/30/20 261 lb (118.4 kg)     GEN: *** Well nourished, well developed in no acute distress HEENT: Normal NECK: No JVD; No carotid bruits LYMPHATICS: No lymphadenopathy CARDIAC: ***RRR, no murmurs, rubs, gallops RESPIRATORY:  Clear  to auscultation without rales, wheezing or rhonchi  ABDOMEN: Soft, non-tender, non-distended MUSCULOSKELETAL:  No edema; No deformity  SKIN: Warm and dry NEUROLOGIC:  Alert and oriented x 3 PSYCHIATRIC:  Normal affect   ASSESSMENT:    No diagnosis found. PLAN:    In order of problems listed above:  ***   {Are you ordering a CV Procedure (e.g. stress test, cath, DCCV, TEE, etc)?   Press F2        :[id number]}    Medication Adjustments/Labs and Tests Ordered: Current medicines are reviewed at length with the patient today.  Concerns regarding medicines are outlined above.  No orders of the defined types were placed in this encounter.  No orders of the defined types were placed in this encounter.   There are no  Patient Instructions on file for this visit.   Signed, Maisie Fus, MD  12/25/2020 9:26 PM    [facility]

## 2020-12-26 ENCOUNTER — Ambulatory Visit: Payer: 59 | Admitting: Internal Medicine

## 2020-12-30 ENCOUNTER — Encounter: Payer: Self-pay | Admitting: Nurse Practitioner

## 2020-12-30 ENCOUNTER — Ambulatory Visit (INDEPENDENT_AMBULATORY_CARE_PROVIDER_SITE_OTHER): Payer: 59 | Admitting: Nurse Practitioner

## 2020-12-30 ENCOUNTER — Other Ambulatory Visit: Payer: Self-pay

## 2020-12-30 VITALS — BP 145/88 | HR 79 | Temp 98.2°F | Ht 71.0 in | Wt 272.0 lb

## 2020-12-30 DIAGNOSIS — K529 Noninfective gastroenteritis and colitis, unspecified: Secondary | ICD-10-CM

## 2020-12-30 MED ORDER — CIPROFLOXACIN HCL 500 MG PO TABS: 750.0000 mg | ORAL_TABLET | Freq: Every day | ORAL | 0 refills | Status: AC

## 2020-12-30 MED ORDER — METRONIDAZOLE 500 MG PO TABS: 500.0000 mg | ORAL_TABLET | Freq: Two times a day (BID) | ORAL | 0 refills | Status: AC

## 2020-12-30 MED ORDER — ONDANSETRON HCL 4 MG PO TABS: 4.0000 mg | ORAL_TABLET | Freq: Three times a day (TID) | ORAL | 0 refills | Status: DC | PRN

## 2020-12-30 MED ORDER — DICYCLOMINE HCL 10 MG PO CAPS: 10.0000 mg | ORAL_CAPSULE | Freq: Two times a day (BID) | ORAL | 0 refills | Status: DC | PRN

## 2020-12-30 NOTE — Patient Instructions (Signed)
You were seen today in the [facility] for  abdominal with nausea/ vomiting/ diarrhea.  You were prescribed medications, please take as directed. Please follow up in 1-2  wks as needed if symptoms worsen or progress

## 2020-12-30 NOTE — Progress Notes (Signed)
Walter Howell, Walter Howell  [id number] Phone:  [phone]   Fax:  [phone] Subjective:   Patient ID: Walter Howell, male    DOB: 03-16-1985, 35 y.o.   MRN: [id number]  Chief Complaint  Patient presents with   Nausea    Diarrhea, nausea/vomiting 2 days ago. Recent trip to [place] returned on 10/27 has been sick since. Has taken Pepto bismol and otc diarrhea medication with no relief.    HPI Walter Howell 35 y.o. male  has a past medical history of Anxiety, Asthma, Coronavirus infection (01/2020), COVID, Dizziness, GERD (gastroesophageal reflux disease), Hypertension, Seasonal allergies, Vertigo, and Vitamin D deficiency. To the [facility] for abdominal pain, nausea/ vomiting and diarrhea x 2 days. Patient states that symptoms began after returning from the [place]. Abdominal pain is generalized, currently rates 5/10 and describes as cramping. Endorses being able to eat and drink without vomiting. Has been eating in small portions. Has been taking detox and cleansing teas, in addition to pepto bismol and anti diarrheal medication with no improvement in symptoms. Endorses having 3-4 stools per day. States that his friend and travel companion has similar symptoms.   When questioned about activities/ habits while on vacation, endorses using water to brush teeth and possibly ingesting water while in the shower. Denies any other complaints during visit today. Denies any fever. Denies any fatigue, chest pain, shortness of breath, HA or dizziness. Denies any blurred vision, numbness or tingling.   Past Medical History:  Diagnosis Date   Anxiety    Asthma    Coronavirus infection 01/2020   COVID    Dizziness    GERD (gastroesophageal reflux disease)    Hypertension    Seasonal allergies    Vertigo    Vitamin D deficiency     No past surgical history on file.  Family History  Problem Relation Age of Onset   Diabetes Maternal Grandmother     Hypertension Maternal Grandmother    Diabetes Maternal Grandfather    Diabetes Paternal Grandmother    Heart disease Paternal Grandmother    Diabetes Paternal Grandfather     Social History   Socioeconomic History   Marital status: Significant Other    Spouse name: Not on file   Number of children: Not on file   Years of education: Not on file   Highest education level: Not on file  Occupational History   Not on file  Tobacco Use   Smoking status: Former   Smokeless tobacco: Never  Vaping Use   Vaping Use: Never used  Substance and Sexual Activity   Alcohol use: No   Drug use: Yes    Types: Marijuana   Sexual activity: Yes  Other Topics Concern   Not on file  Social History Narrative   Not on file   Social Determinants of Health   Financial Resource Strain: Not on file  Food Insecurity: Not on file  Transportation Needs: Not on file  Physical Activity: Not on file  Stress: Not on file  Social Connections: Not on file  Intimate Partner Violence: Not on file    Outpatient Medications Prior to Visit  Medication Sig Dispense Refill   acetaminophen (TYLENOL) 500 MG tablet Take 1,000 mg by mouth every 6 (six) hours as needed for mild pain, moderate pain, fever or headache.     albuterol (VENTOLIN HFA) 108 (90 Base) MCG/ACT inhaler Inhale 2 puffs into the lungs every 6 (six) hours as  needed for wheezing or shortness of breath. 18 g 11   Budesonide 90 MCG/ACT inhaler Inhale 1 puff into the lungs 2 (two) times daily. 1 each 0   busPIRone (BUSPAR) 15 MG tablet Take 1 tablet (15 mg total) by mouth 2 (two) times daily. 60 tablet 3   cetirizine (ZYRTEC) 10 MG tablet Take 1 tablet (10 mg total) by mouth daily. 90 tablet 3   cyclobenzaprine (FLEXERIL) 10 MG tablet Take 1 tablet (10 mg total) by mouth 3 (three) times daily as needed for muscle spasms. 30 tablet 3   hydrOXYzine (ATARAX/VISTARIL) 25 MG tablet Take 25 mg by mouth 3 (three) times daily.     ibuprofen (ADVIL) 800 MG  tablet Take 1 tablet (800 mg total) by mouth 3 (three) times daily. 21 tablet 0   losartan-hydrochlorothiazide (HYZAAR) 50-12.5 MG tablet Take 1 tablet by mouth daily. 90 tablet 3   meclizine (ANTIVERT) 25 MG tablet Take 1 tablet (25 mg total) by mouth 3 (three) times daily as needed for dizziness. 90 tablet 6   Multiple Vitamin (MULTIVITAMIN WITH MINERALS) TABS tablet Take 1 tablet by mouth daily.     omeprazole (PRILOSEC) 20 MG capsule Take 1 capsule (20 mg total) by mouth daily. 90 capsule 3   Vitamin D, Ergocalciferol, (DRISDOL) 1.25 MG (50000 UNIT) CAPS capsule Take 1 capsule (50,000 Units total) by mouth every 7 (seven) days. 16 capsule 0   fluticasone (FLONASE) 50 MCG/ACT nasal spray Place 1-2 sprays into both nostrils daily for 7 days. 9.9 g 11   No facility-administered medications prior to visit.    Allergies  Allergen Reactions   Prednisone     "makes him feel weird"    Review of Systems  Constitutional:  Negative for chills, fever and malaise/fatigue.  Respiratory:  Negative for cough and shortness of breath.   Cardiovascular:  Negative for chest pain, palpitations and leg swelling.  Gastrointestinal:  Positive for abdominal pain, diarrhea, nausea and vomiting. Negative for blood in stool and constipation.  Genitourinary: Negative.   Musculoskeletal: Negative.   Skin: Negative.   Neurological: Negative.   Psychiatric/Behavioral:  Negative for depression. The patient is not nervous/anxious.   All other systems reviewed and are negative.     Objective:    Physical Exam Vitals reviewed.  Constitutional:      General: He is not in acute distress.    Appearance: Normal appearance.  HENT:     Head: Normocephalic.  Cardiovascular:     Rate and Rhythm: Normal rate and regular rhythm.     Pulses: Normal pulses.     Heart sounds: Normal heart sounds.     Comments: No obvious peripheral edema Pulmonary:     Effort: Pulmonary effort is normal.     Breath sounds: Normal  breath sounds.  Abdominal:     General: Abdomen is flat. Bowel sounds are normal. There is no distension.     Palpations: Abdomen is soft. There is no mass.     Tenderness: There is no abdominal tenderness. There is no right CVA tenderness, left CVA tenderness, guarding or rebound.     Hernia: No hernia is present.  Skin:    General: Skin is warm and dry.     Capillary Refill: Capillary refill takes less than 2 seconds.  Neurological:     General: No focal deficit present.     Mental Status: He is alert and oriented to person, place, and time.  Psychiatric:  Mood and Affect: Mood normal.        Behavior: Behavior normal.        Thought Content: Thought content normal.        Judgment: Judgment normal.    BP (!) 145/88 (BP Location: Right Arm, Patient Position: Sitting)   Pulse 79   Temp 98.2 F (36.8 C)   Ht 5' 11"  (1.803 m)   Wt 272 lb 0.4 oz (123.4 kg)   SpO2 95%   BMI 37.94 kg/m  Wt Readings from Last 3 Encounters:  12/30/20 272 lb 0.4 oz (123.4 kg)  11/10/20 270 lb 0.6 oz (122.5 kg)  05/02/20 280 lb (127 kg)     There is no immunization history on file for this patient.  Diabetic Foot Exam - Simple   No data filed     Lab Results  Component Value Date   TSH 2.720 11/23/2019   Lab Results  Component Value Date   WBC 5.8 11/10/2020   HGB 16.8 11/10/2020   HCT 47.4 11/10/2020   MCV 88 11/10/2020   PLT 225 11/10/2020   Lab Results  Component Value Date   NA 141 11/10/2020   K 3.8 11/10/2020   CO2 25 11/23/2019   GLUCOSE 101 (H) 11/10/2020   BUN 13 11/10/2020   CREATININE 0.94 11/10/2020   BILITOT 0.5 11/10/2020   ALKPHOS 45 11/10/2020   AST 26 11/10/2020   ALT 49 (H) 11/23/2019   PROT 7.3 11/10/2020   ALBUMIN 4.7 11/10/2020   CALCIUM 9.7 11/10/2020   ANIONGAP 9 01/18/2017   EGFR 110 11/10/2020   Lab Results  Component Value Date   CHOL 207 (H) 11/10/2020   CHOL 217 (H) 11/23/2019   Lab Results  Component Value Date   HDL 39 (L)  11/10/2020   HDL 39 (L) 11/23/2019   Lab Results  Component Value Date   LDLCALC 146 (H) 11/10/2020   LDLCALC 141 (H) 11/23/2019   Lab Results  Component Value Date   TRIG 121 11/10/2020   TRIG 207 (H) 11/23/2019   Lab Results  Component Value Date   CHOLHDL 5.3 (H) 11/10/2020   CHOLHDL 5.6 (H) 11/23/2019   Lab Results  Component Value Date   HGBA1C 5.5 11/10/2020   HGBA1C 5.6 11/23/2019   HGBA1C 5.6 11/23/2019   HGBA1C 5.6 (A) 11/23/2019   HGBA1C 5.6 11/23/2019       Assessment & Plan:   Problem List Items Addressed This Visit   None Visit Diagnoses     Gastroenteritis    -  Primary   Relevant Medications   ciprofloxacin (CIPRO) 500 MG tablet   metroNIDAZOLE (FLAGYL) 500 MG tablet   ondansetron (ZOFRAN) 4 MG tablet   dicyclomine (BENTYL) 10 MG capsule   Other Relevant Orders   CBC with Differential/Platelet   Comprehensive metabolic panel   Lipase   Colitis       Relevant Medications   ciprofloxacin (CIPRO) 500 MG tablet   metroNIDAZOLE (FLAGYL) 500 MG tablet   ondansetron (ZOFRAN) 4 MG tablet   dicyclomine (BENTYL) 10 MG capsule   Other Relevant Orders   CBC with Differential/Platelet   Comprehensive metabolic panel   Lipase Discussed BRAT diet  Discussed non pharmacological methods for managing symptoms  Given clinic and ED return precautions Discussed importance of maintaining hydration until symptoms resolve    Follow up in 1-2 wks as needed if symptoms worsen or do not improve, sooner as needed     I am having  Yi C. Abair start on ciprofloxacin, metroNIDAZOLE, ondansetron, and dicyclomine. I am also having him maintain his multivitamin with minerals, acetaminophen, busPIRone, meclizine, fluticasone, Budesonide, albuterol, cetirizine, cyclobenzaprine, omeprazole, ibuprofen, hydrOXYzine, losartan-hydrochlorothiazide, and Vitamin D (Ergocalciferol).  Meds ordered this encounter  Medications   ciprofloxacin (CIPRO) 500 MG tablet    Sig: Take  1.5 tablets (750 mg total) by mouth daily for 3 days.    Dispense:  4.5 tablet    Refill:  0   metroNIDAZOLE (FLAGYL) 500 MG tablet    Sig: Take 1 tablet (500 mg total) by mouth 2 (two) times daily for 7 days.    Dispense:  14 tablet    Refill:  0   ondansetron (ZOFRAN) 4 MG tablet    Sig: Take 1 tablet (4 mg total) by mouth every 8 (eight) hours as needed for nausea or vomiting.    Dispense:  20 tablet    Refill:  0   dicyclomine (BENTYL) 10 MG capsule    Sig: Take 1 capsule (10 mg total) by mouth 2 (two) times daily as needed for up to 7 days (abdominal pain).    Dispense:  14 capsule    Refill:  0     Teena Dunk, NP

## 2020-12-31 LAB — CBC WITH DIFFERENTIAL/PLATELET
Basophils Absolute: 0 10*3/uL (ref 0.0–0.2)
Basos: 0 %
EOS (ABSOLUTE): 0.1 10*3/uL (ref 0.0–0.4)
Eos: 1 %
Hematocrit: 48.3 % (ref 37.5–51.0)
Hemoglobin: 16.2 g/dL (ref 13.0–17.7)
Immature Grans (Abs): 0 10*3/uL (ref 0.0–0.1)
Immature Granulocytes: 0 %
Lymphocytes Absolute: 1.7 10*3/uL (ref 0.7–3.1)
Lymphs: 25 %
MCH: 30.7 pg (ref 26.6–33.0)
MCHC: 33.5 g/dL (ref 31.5–35.7)
MCV: 92 fL (ref 79–97)
Monocytes Absolute: 0.3 10*3/uL (ref 0.1–0.9)
Monocytes: 5 %
Neutrophils Absolute: 4.7 10*3/uL (ref 1.4–7.0)
Neutrophils: 69 %
Platelets: 266 10*3/uL (ref 150–450)
RBC: 5.27 x10E6/uL (ref 4.14–5.80)
RDW: 14.5 % (ref 11.6–15.4)
WBC: 6.9 10*3/uL (ref 3.4–10.8)

## 2020-12-31 LAB — COMPREHENSIVE METABOLIC PANEL
ALT: 42 IU/L (ref 0–44)
AST: 25 IU/L (ref 0–40)
Albumin/Globulin Ratio: 1.7 (ref 1.2–2.2)
Albumin: 4.3 g/dL (ref 4.0–5.0)
Alkaline Phosphatase: 54 IU/L (ref 44–121)
BUN/Creatinine Ratio: 13 (ref 9–20)
BUN: 11 mg/dL (ref 6–20)
Bilirubin Total: 0.3 mg/dL (ref 0.0–1.2)
CO2: 24 mmol/L (ref 20–29)
Calcium: 9.2 mg/dL (ref 8.7–10.2)
Chloride: 103 mmol/L (ref 96–106)
Creatinine, Ser: 0.87 mg/dL (ref 0.76–1.27)
Globulin, Total: 2.5 g/dL (ref 1.5–4.5)
Glucose: 93 mg/dL (ref 70–99)
Potassium: 3.8 mmol/L (ref 3.5–5.2)
Sodium: 143 mmol/L (ref 134–144)
Total Protein: 6.8 g/dL (ref 6.0–8.5)
eGFR: 116 mL/min/{1.73_m2} (ref >59)

## 2020-12-31 LAB — LIPASE: Lipase: 42 U/L (ref 13–78)

## 2021-01-31 ENCOUNTER — Other Ambulatory Visit: Payer: Self-pay | Admitting: Nurse Practitioner

## 2021-02-07 ENCOUNTER — Ambulatory Visit: Payer: 59 | Admitting: Cardiology

## 2021-03-20 ENCOUNTER — Other Ambulatory Visit: Payer: Self-pay

## 2021-03-20 ENCOUNTER — Ambulatory Visit (HOSPITAL_COMMUNITY)
Admission: EM | Admit: 2021-03-20 | Discharge: 2021-03-20 | Disposition: A | Discharge status: Home / Self Care | Payer: 59 | Attending: Family Medicine | Admitting: Family Medicine

## 2021-03-20 ENCOUNTER — Encounter (HOSPITAL_COMMUNITY): Payer: Self-pay

## 2021-03-20 DIAGNOSIS — G542 Cervical root disorders, not elsewhere classified: Secondary | ICD-10-CM

## 2021-03-20 DIAGNOSIS — M542 Cervicalgia: Secondary | ICD-10-CM

## 2021-03-20 DIAGNOSIS — M79601 Pain in right arm: Secondary | ICD-10-CM

## 2021-03-20 MED ORDER — HYDROCODONE-ACETAMINOPHEN 5-325 MG PO TABS: 2.0000 | ORAL_TABLET | ORAL | 0 refills | Status: AC | PRN

## 2021-03-20 MED ORDER — GABAPENTIN 300 MG PO CAPS: 300.0000 mg | ORAL_CAPSULE | Freq: Three times a day (TID) | ORAL | 0 refills | Status: DC

## 2021-03-20 NOTE — Discharge Instructions (Signed)
You were seen today for right arm pain and neck pain.  I believe you likely have a pinched cervical nerve.  I have sent out gabapentin and pain medication today.  Both could make you sleepy so please take when you will be home.  I recommend you follow up with your primary care provider for further discussion about your pain.

## 2021-03-20 NOTE — ED Provider Notes (Signed)
[facility]    CSN: [id number] Arrival date & time: 03/20/21  [id number]      History   Chief Complaint No chief complaint on file.   HPI Walter Howell is a 36 y.o. male.   Patient is here for right arm pain.  Going on x 5 days.  Starts in the upper back/neck area and shoots down the right arm, down to the index and middle finger;  At first was coming and going, and now constant.  9/10 in pain.  Movement may make it a bit worse.  He has taken motrin, muscle relaxers, and lidocaine patches without much help.  Mild numbness/tingling.  He works in a [employer].  No known injury.  No heavy lifting.  He is allergic to prednisone.  He has not slept the last 5 nights due to pain.    Past Medical History:  Diagnosis Date   Anxiety    Asthma    Coronavirus infection 01/2020   COVID    Dizziness    GERD (gastroesophageal reflux disease)    Hypertension    Seasonal allergies    Vertigo    Vitamin D deficiency     Patient Active Problem List   Diagnosis Date Noted   Mild intermittent asthma without complication [id number]   Obesity (BMI 35.0-39.9 without comorbidity) 09/04/2019   Benign paroxysmal positional vertigo 07/23/2017   TMJ (temporomandibular joint disorder) 07/23/2017   Gastroesophageal reflux disease without esophagitis 07/23/2017   History of herniated intervertebral disc 12/25/2016   Palpitations 12/25/2016   Anxiety and depression 12/06/2015   Chronic bilateral low back pain without sciatica 12/06/2015   Essential hypertension 07/08/2013    History reviewed. No pertinent surgical history.     Home Medications    Prior to Admission medications   Medication Sig Start Date End Date Taking? Authorizing Provider  acetaminophen (TYLENOL) 500 MG tablet Take 1,000 mg by mouth every 6 (six) hours as needed for mild pain, moderate pain, fever or headache.    [provider]  albuterol (VENTOLIN HFA) 108 (90 Base) MCG/ACT inhaler Inhale 2  puffs into the lungs every 6 (six) hours as needed for wheezing or shortness of breath. 09/13/20   Vevelyn Francois, NP  Budesonide 90 MCG/ACT inhaler Inhale 1 puff into the lungs 2 (two) times daily. 07/27/20   White, Leitha Schuller, NP  busPIRone (BUSPAR) 15 MG tablet Take 1 tablet (15 mg total) by mouth 2 (two) times daily. 02/12/19   Argentina Donovan, PA-C  cetirizine (ZYRTEC) 10 MG tablet Take 1 tablet (10 mg total) by mouth daily. 09/13/20   Vevelyn Francois, NP  cyclobenzaprine (FLEXERIL) 10 MG tablet Take 1 tablet (10 mg total) by mouth 3 (three) times daily as needed for muscle spasms. 09/13/20   Vevelyn Francois, NP  dicyclomine (BENTYL) 10 MG capsule Take 1 capsule (10 mg total) by mouth 2 (two) times daily as needed for up to 7 days (abdominal pain). 12/30/20 01/06/21  Passmore, Jake Church I, NP  fluticasone (FLONASE) 50 MCG/ACT nasal spray Place 1-2 sprays into both nostrils daily for 7 days. 03/30/20 04/06/20  Azzie Glatter, FNP  hydrOXYzine (ATARAX/VISTARIL) 25 MG tablet Take 25 mg by mouth 3 (three) times daily. 05/19/20   [provider]  ibuprofen (ADVIL) 800 MG tablet Take 1 tablet (800 mg total) by mouth 3 (three) times daily. 10/28/20   Pearson Forster, NP  losartan-hydrochlorothiazide (HYZAAR) 50-12.5 MG tablet Take 1 tablet by mouth daily. 11/10/20  Vevelyn Francois, NP  meclizine (ANTIVERT) 25 MG tablet Take 1 tablet (25 mg total) by mouth 3 (three) times daily as needed for dizziness. 11/23/19   Azzie Glatter, FNP  Multiple Vitamin (MULTIVITAMIN WITH MINERALS) TABS tablet Take 1 tablet by mouth daily.    [provider]  omeprazole (PRILOSEC) 20 MG capsule Take 1 capsule (20 mg total) by mouth daily. 09/13/20   Vevelyn Francois, NP  ondansetron (ZOFRAN) 4 MG tablet Take 1 tablet (4 mg total) by mouth every 8 (eight) hours as needed for nausea or vomiting. 12/30/20   Bo Merino I, NP  Vitamin D, Ergocalciferol, (DRISDOL) 1.25 MG (50000 UNIT) CAPS capsule TAKE 1 CAPSULE  (50,000 UNITS TOTAL) BY MOUTH EVERY 7 (SEVEN) DAYS 01/31/21   Vevelyn Francois, NP    Family History Family History  Problem Relation Age of Onset   Diabetes Maternal Grandmother    Hypertension Maternal Grandmother    Diabetes Maternal Grandfather    Diabetes Paternal Grandmother    Heart disease Paternal Grandmother    Diabetes Paternal Grandfather     Social History Social History   Tobacco Use   Smoking status: Former   Smokeless tobacco: Never  Scientific laboratory technician Use: Never used  Substance Use Topics   Alcohol use: No   Drug use: Yes    Types: Marijuana     Allergies   Prednisone   Review of Systems Review of Systems  Constitutional: Negative.   HENT: Negative.    Respiratory: Negative.    Cardiovascular: Negative.   Musculoskeletal:  Positive for myalgias and neck pain.  Neurological:  Positive for weakness and numbness.    Physical Exam Triage Vital Signs ED Triage Vitals  Enc Vitals Group     BP 03/20/21 0919 (!) 148/90     Pulse Rate 03/20/21 0919 84     Resp 03/20/21 0919 16     Temp 03/20/21 0919 99.1 F (37.3 C)     Temp Source 03/20/21 0919 Oral     SpO2 03/20/21 0919 100 %     Weight --      Height --      Head Circumference --      Peak Flow --      Pain Score 03/20/21 0920 9     Pain Loc --      Pain Edu? --      Excl. in [place]? --    No data found.  Updated Vital Signs BP (!) 148/90 (BP Location: Left Arm)    Pulse 84    Temp 99.1 F (37.3 C) (Oral)    Resp 16    SpO2 100%   Visual Acuity Right Eye Distance:   Left Eye Distance:   Bilateral Distance:    Right Eye Near:   Left Eye Near:    Bilateral Near:     Physical Exam Constitutional:      General: He is not in acute distress.    Appearance: Normal appearance.  Cardiovascular:     Rate and Rhythm: Normal rate and regular rhythm.  Pulmonary:     Effort: Pulmonary effort is normal.     Breath sounds: Normal breath sounds.  Musculoskeletal:     Right shoulder:  Tenderness present. Decreased range of motion.     Cervical back: Tenderness present. Pain with movement present.     Comments: TTP at the cervical spine;  able to flex the neck;  unable to extend due  to pain;   Limited ROM at the right arm due to pain;  c/o pain and numbness down the arm with movement  Neurological:     Mental Status: He is alert.     UC Treatments / Results  Labs (all labs ordered are listed, but only abnormal results are displayed) Labs Reviewed - No data to display  EKG   Radiology No results found.  Procedures Procedures (including critical care time)  Medications Ordered in UC Medications - No data to display  Initial Impression / Assessment and Plan / UC Course  I have reviewed the triage vital signs and the nursing notes.  Pertinent labs & imaging results that were available during my care of the patient were reviewed by me and considered in my medical decision making (see chart for details).   Patient seen today for right arm pain.  I suspect cervical nerve impingement.  Medications given today.  Advised to follow up with his pcp for further care.   Final Clinical Impressions(s) / UC Diagnoses   Final diagnoses:  Right arm pain  Neck pain  Cervical nerve root impingement     Discharge Instructions      You were seen today for right arm pain and neck pain.  I believe you likely have a pinched cervical nerve.  I have sent out gabapentin and pain medication today.  Both could make you sleepy so please take when you will be home.  I recommend you follow up with your primary care provider for further discussion about your pain.     ED Prescriptions     Medication Sig Dispense Auth. Provider   gabapentin (NEURONTIN) 300 MG capsule Take 1 capsule (300 mg total) by mouth 3 (three) times daily for 7 days. 21 capsule Shea Swalley, MD   HYDROcodone-acetaminophen (NORCO/VICODIN) 5-325 MG tablet Take 2 tablets by mouth every 4 (four) hours as needed  for up to 3 days. 10 tablet Rondel Oh, MD      PDMP not reviewed this encounter.   Rondel Oh, MD 03/20/21 1028

## 2021-03-22 ENCOUNTER — Encounter: Payer: Self-pay | Admitting: Nurse Practitioner

## 2021-03-22 ENCOUNTER — Other Ambulatory Visit: Payer: Self-pay

## 2021-03-22 ENCOUNTER — Ambulatory Visit (INDEPENDENT_AMBULATORY_CARE_PROVIDER_SITE_OTHER): Payer: 59 | Admitting: Nurse Practitioner

## 2021-03-22 VITALS — HR 98 | Temp 99.0°F | Resp 16 | Wt 272.6 lb

## 2021-03-22 DIAGNOSIS — R6889 Other general symptoms and signs: Secondary | ICD-10-CM | POA: Diagnosis not present

## 2021-03-22 LAB — POCT INFLUENZA A/B
Influenza A, POC: NEGATIVE
Influenza B, POC: NEGATIVE

## 2021-03-22 NOTE — Progress Notes (Signed)
[place], [place]  [address] Phone:  [phone]   Fax:  [phone] Subjective:   Patient ID: Walter Howell, male    DOB: 05-Jan-1986, 36 y.o.   MRN: [id number]  Chief Complaint  Patient presents with   Generalized Body Aches   Fever   Cough   Chills    Walter Howell 36 y.o. male  has a past medical history of Anxiety, Asthma, Coronavirus infection (01/2020), COVID, Dizziness, GERD (gastroesophageal reflux disease), Hypertension, Seasonal allergies, Vertigo, and Vitamin D deficiency.  To the [facility] for flu like symptoms.   States that he began having flu like symptoms yesterday, that worsened this morning. States that he has had a productive cough with yellow sputum and fever of 100.9 F . Took tylenol and ibuprofen this morning with moderate resolution in fever. Endorses chest pain with cough only. Sick contacts unknown, currently works at [employer] in [place] some throat discomfort, without pain or difficulty swallowing. Denies any shortness of breath. Denies any other complaints today.  Denies any fatigue,HA or dizziness. Denies any blurred vision, numbness or tingling.  Past Medical History:  Diagnosis Date   Anxiety    Asthma    Coronavirus infection 01/2020   COVID    Dizziness    GERD (gastroesophageal reflux disease)    Hypertension    Seasonal allergies    Vertigo    Vitamin D deficiency     No past surgical history on file.  Family History  Problem Relation Age of Onset   Diabetes Maternal Grandmother    Hypertension Maternal Grandmother    Diabetes Maternal Grandfather    Diabetes Paternal Grandmother    Heart disease Paternal Grandmother    Diabetes Paternal Grandfather     Social History   Socioeconomic History   Marital status: Significant Other    Spouse name: Not on file   Number of children: Not on file   Years of education: Not on file   Highest education level: Not on  file  Occupational History   Not on file  Tobacco Use   Smoking status: Former   Smokeless tobacco: Never  Vaping Use   Vaping Use: Never used  Substance and Sexual Activity   Alcohol use: No   Drug use: Yes    Types: Marijuana   Sexual activity: Yes  Other Topics Concern   Not on file  Social History Narrative   Not on file   Social Determinants of Health   Financial Resource Strain: Not on file  Food Insecurity: Not on file  Transportation Needs: Not on file  Physical Activity: Not on file  Stress: Not on file  Social Connections: Not on file  Intimate Partner Violence: Not on file    Outpatient Medications Prior to Visit  Medication Sig Dispense Refill   acetaminophen (TYLENOL) 500 MG tablet Take 1,000 mg by mouth every 6 (six) hours as needed for mild pain, moderate pain, fever or headache.     albuterol (VENTOLIN HFA) 108 (90 Base) MCG/ACT inhaler Inhale 2 puffs into the lungs every 6 (six) hours as needed for wheezing or shortness of breath. 18 g 11   Budesonide 90 MCG/ACT inhaler Inhale 1 puff into the lungs 2 (two) times daily. 1 each 0   busPIRone (BUSPAR) 15 MG tablet Take 1 tablet (15 mg total) by mouth 2 (two) times daily. 60 tablet 3   cetirizine (ZYRTEC) 10 MG tablet Take 1 tablet (  10 mg total) by mouth daily. 90 tablet 3   cyclobenzaprine (FLEXERIL) 10 MG tablet Take 1 tablet (10 mg total) by mouth 3 (three) times daily as needed for muscle spasms. 30 tablet 3   dicyclomine (BENTYL) 10 MG capsule Take 1 capsule (10 mg total) by mouth 2 (two) times daily as needed for up to 7 days (abdominal pain). 14 capsule 0   fluticasone (FLONASE) 50 MCG/ACT nasal spray Place 1-2 sprays into both nostrils daily for 7 days. 9.9 g 11   gabapentin (NEURONTIN) 300 MG capsule Take 1 capsule (300 mg total) by mouth 3 (three) times daily for 7 days. 21 capsule 0   HYDROcodone-acetaminophen (NORCO/VICODIN) 5-325 MG tablet Take 2 tablets by mouth every 4 (four) hours as needed for up  to 3 days. 10 tablet 0   hydrOXYzine (ATARAX/VISTARIL) 25 MG tablet Take 25 mg by mouth 3 (three) times daily.     ibuprofen (ADVIL) 800 MG tablet Take 1 tablet (800 mg total) by mouth 3 (three) times daily. 21 tablet 0   losartan-hydrochlorothiazide (HYZAAR) 50-12.5 MG tablet Take 1 tablet by mouth daily. 90 tablet 3   meclizine (ANTIVERT) 25 MG tablet Take 1 tablet (25 mg total) by mouth 3 (three) times daily as needed for dizziness. 90 tablet 6   Multiple Vitamin (MULTIVITAMIN WITH MINERALS) TABS tablet Take 1 tablet by mouth daily.     omeprazole (PRILOSEC) 20 MG capsule Take 1 capsule (20 mg total) by mouth daily. 90 capsule 3   ondansetron (ZOFRAN) 4 MG tablet Take 1 tablet (4 mg total) by mouth every 8 (eight) hours as needed for nausea or vomiting. 20 tablet 0   Vitamin D, Ergocalciferol, (DRISDOL) 1.25 MG (50000 UNIT) CAPS capsule TAKE 1 CAPSULE (50,000 UNITS TOTAL) BY MOUTH EVERY 7 (SEVEN) DAYS 13 capsule 1   No facility-administered medications prior to visit.    Allergies  Allergen Reactions   Prednisone     "makes him feel weird"    Review of Systems  Constitutional:  Positive for chills and fever. Negative for malaise/fatigue.  HENT: Negative.    Eyes: Negative.   Respiratory:  Positive for cough. Negative for shortness of breath.   Cardiovascular:  Positive for chest pain. Negative for palpitations and leg swelling.  Gastrointestinal:  Negative for abdominal pain, blood in stool, constipation, diarrhea, nausea and vomiting.  Musculoskeletal: Negative.   Skin: Negative.   Neurological: Negative.   Psychiatric/Behavioral:  Negative for depression. The patient is not nervous/anxious.   All other systems reviewed and are negative.     Objective:    Physical Exam Vitals reviewed.  Constitutional:      General: He is not in acute distress.    Appearance: Normal appearance. He is obese.  HENT:     Head: Normocephalic.     Right Ear: Tympanic membrane, ear canal and  external ear normal.     Left Ear: Tympanic membrane, ear canal and external ear normal.     Nose: Nose normal.     Mouth/Throat:     Mouth: Mucous membranes are moist.     Pharynx: Oropharynx is clear.  Eyes:     Extraocular Movements: Extraocular movements intact.     Conjunctiva/sclera: Conjunctivae normal.     Pupils: Pupils are equal, round, and reactive to light.  Neck:     Vascular: No carotid bruit.  Cardiovascular:     Rate and Rhythm: Normal rate and regular rhythm.     Pulses: Normal pulses.  Heart sounds: Normal heart sounds.     Comments: No obvious peripheral edema Pulmonary:     Effort: Pulmonary effort is normal.     Breath sounds: Normal breath sounds.  Musculoskeletal:     Cervical back: Normal range of motion and neck supple. No rigidity or tenderness.  Lymphadenopathy:     Cervical: No cervical adenopathy.  Skin:    General: Skin is warm and dry.     Capillary Refill: Capillary refill takes less than 2 seconds.  Neurological:     General: No focal deficit present.     Mental Status: He is alert and oriented to person, place, and time.  Psychiatric:        Mood and Affect: Mood normal.        Behavior: Behavior normal.        Thought Content: Thought content normal.        Judgment: Judgment normal.    Pulse 98    Temp 99 F (37.2 C)    Resp 16    Wt 272 lb 9.6 oz (123.7 kg)    SpO2 98%    BMI 38.02 kg/m  Wt Readings from Last 3 Encounters:  03/22/21 272 lb 9.6 oz (123.7 kg)  12/30/20 272 lb 0.4 oz (123.4 kg)  11/10/20 270 lb 0.6 oz (122.5 kg)     There is no immunization history on file for this patient.  Diabetic Foot Exam - Simple   No data filed     Lab Results  Component Value Date   TSH 2.720 11/23/2019   Lab Results  Component Value Date   WBC 6.9 12/30/2020   HGB 16.2 12/30/2020   HCT 48.3 12/30/2020   MCV 92 12/30/2020   PLT 266 12/30/2020   Lab Results  Component Value Date   NA 143 12/30/2020   K 3.8 12/30/2020    CO2 24 12/30/2020   GLUCOSE 93 12/30/2020   BUN 11 12/30/2020   CREATININE 0.87 12/30/2020   BILITOT 0.3 12/30/2020   ALKPHOS 54 12/30/2020   AST 25 12/30/2020   ALT 42 12/30/2020   PROT 6.8 12/30/2020   ALBUMIN 4.3 12/30/2020   CALCIUM 9.2 12/30/2020   ANIONGAP 9 01/18/2017   EGFR 116 12/30/2020   Lab Results  Component Value Date   CHOL 207 (H) 11/10/2020   CHOL 217 (H) 11/23/2019   Lab Results  Component Value Date   HDL 39 (L) 11/10/2020   HDL 39 (L) 11/23/2019   Lab Results  Component Value Date   LDLCALC 146 (H) 11/10/2020   LDLCALC 141 (H) 11/23/2019   Lab Results  Component Value Date   TRIG 121 11/10/2020   TRIG 207 (H) 11/23/2019   Lab Results  Component Value Date   CHOLHDL 5.3 (H) 11/10/2020   CHOLHDL 5.6 (H) 11/23/2019   Lab Results  Component Value Date   HGBA1C 5.5 11/10/2020   HGBA1C 5.6 11/23/2019   HGBA1C 5.6 11/23/2019   HGBA1C 5.6 (A) 11/23/2019   HGBA1C 5.6 11/23/2019       Assessment & Plan:   Problem List Items Addressed This Visit   None Visit Diagnoses     Flu-like symptoms    -  Primary   Relevant Orders   Influenza A/B Informed to take OTC medications as needed for symptoms Discussed non pharmacological methods for symptom management  Discussed the importance of hydration and rest   Follow up in 1-2 wks if symptoms have worsened or progressed, sooner as  needed    I am having Jerrico C. Rouch maintain his multivitamin with minerals, acetaminophen, busPIRone, meclizine, fluticasone, Budesonide, albuterol, cetirizine, cyclobenzaprine, omeprazole, ibuprofen, hydrOXYzine, losartan-hydrochlorothiazide, ondansetron, dicyclomine, Vitamin D (Ergocalciferol), gabapentin, and HYDROcodone-acetaminophen.  No orders of the defined types were placed in this encounter.    Teena Dunk, NP

## 2021-03-22 NOTE — Patient Instructions (Signed)
You were seen today in the [facility] for flu like symptoms. Labs were collected, results will be available via MyChart or, if abnormal, you will be contacted by clinic staff. You were prescribed medications, please take as directed. Please follow up  as needed ?

## 2021-05-15 ENCOUNTER — Encounter: Payer: Self-pay | Admitting: Nurse Practitioner

## 2021-05-15 ENCOUNTER — Other Ambulatory Visit: Payer: Self-pay

## 2021-05-15 ENCOUNTER — Ambulatory Visit (INDEPENDENT_AMBULATORY_CARE_PROVIDER_SITE_OTHER): Payer: 59 | Admitting: Nurse Practitioner

## 2021-05-15 VITALS — BP 134/90 | HR 91 | Temp 97.7°F | Ht 71.0 in | Wt 268.6 lb

## 2021-05-15 DIAGNOSIS — I10 Essential (primary) hypertension: Secondary | ICD-10-CM | POA: Diagnosis not present

## 2021-05-15 DIAGNOSIS — M25511 Pain in right shoulder: Secondary | ICD-10-CM | POA: Diagnosis not present

## 2021-05-15 DIAGNOSIS — Z113 Encounter for screening for infections with a predominantly sexual mode of transmission: Secondary | ICD-10-CM | POA: Diagnosis not present

## 2021-05-15 DIAGNOSIS — G8929 Other chronic pain: Secondary | ICD-10-CM

## 2021-05-15 DIAGNOSIS — Z13220 Encounter for screening for lipoid disorders: Secondary | ICD-10-CM

## 2021-05-15 MED ORDER — LOSARTAN POTASSIUM-HCTZ 100-25 MG PO TABS: 1.0000 | ORAL_TABLET | Freq: Every day | ORAL | 0 refills | Status: DC

## 2021-05-15 MED ORDER — LIDOCAINE 5 % EX PTCH: 1.0000 | MEDICATED_PATCH | CUTANEOUS | 0 refills | Status: AC

## 2021-05-15 MED ORDER — GABAPENTIN 300 MG PO CAPS: 300.0000 mg | ORAL_CAPSULE | Freq: Every day | ORAL | 1 refills | Status: DC

## 2021-05-15 MED ORDER — SALONPAS LIDOCAINE PLUS 4-10 % EX CREA: 1.0000 "application " | TOPICAL_CREAM | Freq: Three times a day (TID) | CUTANEOUS | 1 refills | Status: AC

## 2021-05-15 NOTE — Progress Notes (Signed)
? ?[facility] ?[address] ?[place], [place]  [address] ?Phone:  [phone]   Fax:  [phone] ? ? ? ? ?Established Patient Office Visit ? ?Subjective:  ?Patient ID: Walter Howell, male    DOB: October 04, 1985  Age: 36 y.o. MRN: [id number] ? ?CC:  ?Chief Complaint  ?Patient presents with  ? Arm Pain  ?  Patient is here today to discuss the right arm pain he has still been having and is taking gabapentin for the pain but it is not working.  Patient states that the pain from his right arm and shoulder is radiating down to the middle of his back.Patient also states that he is having some tingling and numbness sensations in his hands and fingers.  ? ? ?HPI ?Walter Howell presents for follow up. He  has a past medical history of Anxiety, Asthma, Coronavirus infection (01/2020), COVID, Dizziness, GERD (gastroesophageal reflux disease), Hypertension, Seasonal allergies, Vertigo, and Vitamin D deficiency.  ? ? ?Walter Howell is in today for follow up for Hypertension. The current prescribed treatment is Hyzaar 50/12.5 mg. Compliance is reported and home blood pressure monitoring is done with a range 130/90. The  DASH diet is being followed. He is on a strict diet. An exercise regimen is not ongoing. There is a goal to maintain BP. He has occasional dizziness. Denies headache, , visual changes, shortness of breath, dyspnea on exertion, chest pain, nausea, vomiting or any edema.  ? ?Shoulder Pain ?Patient complains of right shoulder pain. The symptoms began several months ago. Aggravating factors: no known event. Pain is located between the neck and shoulder. Discomfort is described as numbness, tingling, and weakness . Symptoms are exacerbated by lying on the shoulder. Evaluation to date: none. Therapy to date includes: rest, avoidance of offending activity, and OTC analgesics which are not very effective.  ? ? ?Past Medical History:  ?Diagnosis Date  ? Anxiety   ? Asthma   ? Coronavirus  infection 01/2020  ? COVID   ? Dizziness   ? GERD (gastroesophageal reflux disease)   ? Hypertension   ? Seasonal allergies   ? Vertigo   ? Vitamin D deficiency   ? ? ?History reviewed. No pertinent surgical history. ? ?Family History  ?Problem Relation Age of Onset  ? Diabetes Maternal Grandmother   ? Hypertension Maternal Grandmother   ? Diabetes Maternal Grandfather   ? Diabetes Paternal Grandmother   ? Heart disease Paternal Grandmother   ? Diabetes Paternal Grandfather   ? ? ?Social History  ? ?Socioeconomic History  ? Marital status: Significant Other  ?  Spouse name: Not on file  ? Number of children: Not on file  ? Years of education: Not on file  ? Highest education level: Not on file  ?Occupational History  ? Not on file  ?Tobacco Use  ? Smoking status: Former  ? Smokeless tobacco: Never  ?Vaping Use  ? Vaping Use: Never used  ?Substance and Sexual Activity  ? Alcohol use: No  ? Drug use: Yes  ?  Types: Marijuana  ? Sexual activity: Yes  ?Other Topics Concern  ? Not on file  ?Social History Narrative  ? Not on file  ? ?Social Determinants of Health  ? ?Financial Resource Strain: Not on file  ?Food Insecurity: Not on file  ?Transportation Needs: Not on file  ?Physical Activity: Not on file  ?Stress: Not on file  ?Social Connections: Not on file  ?Intimate Partner Violence: Not on  file  ? ? ?Outpatient Medications Prior to Visit  ?Medication Sig Dispense Refill  ? acetaminophen (TYLENOL) 500 MG tablet Take 1,000 mg by mouth every 6 (six) hours as needed for mild pain, moderate pain, fever or headache.    ? albuterol (VENTOLIN HFA) 108 (90 Base) MCG/ACT inhaler Inhale 2 puffs into the lungs every 6 (six) hours as needed for wheezing or shortness of breath. 18 g 11  ? Budesonide 90 MCG/ACT inhaler Inhale 1 puff into the lungs 2 (two) times daily. 1 each 0  ? cetirizine (ZYRTEC) 10 MG tablet Take 1 tablet (10 mg total) by mouth daily. 90 tablet 3  ? cyclobenzaprine (FLEXERIL) 10 MG tablet Take 1 tablet (10 mg  total) by mouth 3 (three) times daily as needed for muscle spasms. 30 tablet 3  ? meclizine (ANTIVERT) 25 MG tablet Take 1 tablet (25 mg total) by mouth 3 (three) times daily as needed for dizziness. 90 tablet 6  ? Multiple Vitamin (MULTIVITAMIN WITH MINERALS) TABS tablet Take 1 tablet by mouth daily.    ? omeprazole (PRILOSEC) 20 MG capsule Take 1 capsule (20 mg total) by mouth daily. 90 capsule 3  ? Vitamin D, Ergocalciferol, (DRISDOL) 1.25 MG (50000 UNIT) CAPS capsule TAKE 1 CAPSULE (50,000 UNITS TOTAL) BY MOUTH EVERY 7 (SEVEN) DAYS 13 capsule 1  ? losartan-hydrochlorothiazide (HYZAAR) 50-12.5 MG tablet Take 1 tablet by mouth daily. 90 tablet 3  ? busPIRone (BUSPAR) 15 MG tablet Take 1 tablet (15 mg total) by mouth 2 (two) times daily. (Patient not taking: Reported on 05/15/2021) 60 tablet 3  ? hydrOXYzine (ATARAX/VISTARIL) 25 MG tablet Take 25 mg by mouth 3 (three) times daily. (Patient not taking: Reported on 05/15/2021)    ? ibuprofen (ADVIL) 800 MG tablet Take 1 tablet (800 mg total) by mouth 3 (three) times daily. (Patient not taking: Reported on 05/15/2021) 21 tablet 0  ? ondansetron (ZOFRAN) 4 MG tablet Take 1 tablet (4 mg total) by mouth every 8 (eight) hours as needed for nausea or vomiting. (Patient not taking: Reported on 05/15/2021) 20 tablet 0  ? dicyclomine (BENTYL) 10 MG capsule Take 1 capsule (10 mg total) by mouth 2 (two) times daily as needed for up to 7 days (abdominal pain). 14 capsule 0  ? fluticasone (FLONASE) 50 MCG/ACT nasal spray Place 1-2 sprays into both nostrils daily for 7 days. 9.9 g 11  ? gabapentin (NEURONTIN) 300 MG capsule Take 1 capsule (300 mg total) by mouth 3 (three) times daily for 7 days. 21 capsule 0  ? ?No facility-administered medications prior to visit.  ? ? ?Allergies  ?Allergen Reactions  ? Prednisone   ?  "makes him feel weird"  ? ? ?ROS ?Review of Systems ? ?  ?Objective:  ?  ?Physical Exam ?Constitutional:   ?   Appearance: He is obese.  ?HENT:  ?   Head: Normocephalic  and atraumatic.  ?Eyes:  ?   Pupils: Pupils are equal, round, and reactive to light.  ?Cardiovascular:  ?   Rate and Rhythm: Regular rhythm. Bradycardia present.  ?   Pulses:     ?     Radial pulses are 2+ on the right side and 2+ on the left side.  ?   Heart sounds: Normal heart sounds.  ?Pulmonary:  ?   Effort: Pulmonary effort is normal.  ?   Breath sounds: Examination of the right-lower field reveals decreased breath sounds. Examination of the left-lower field reveals decreased breath sounds. Decreased breath  sounds present. No wheezing, rhonchi or rales.  ?   Comments: Diminished in the bases ?Chest:  ?   Chest wall: No tenderness.  ?Abdominal:  ?   General: Bowel sounds are normal.  ?Musculoskeletal:     ?   General: Normal range of motion.  ?   Right shoulder: Tenderness present.  ?   Left shoulder: Normal.  ?   Cervical back: Normal range of motion.  ?Skin: ?   General: Skin is warm and dry.  ?   Capillary Refill: Capillary refill takes less than 2 seconds.  ?Neurological:  ?   General: No focal deficit present.  ?   Mental Status: He is alert.  ?Psychiatric:     ?   Mood and Affect: Mood normal.     ?   Behavior: Behavior normal.  ? ? ?BP 134/90   Pulse 91   Temp 97.7 ?F (36.5 ?C)   Ht 5\' 11"  (1.803 m)   Wt 268 lb 9.6 oz (121.8 kg)   SpO2 100%   BMI 37.46 kg/m?  ?Wt Readings from Last 3 Encounters:  ?05/15/21 268 lb 9.6 oz (121.8 kg)  ?03/22/21 272 lb 9.6 oz (123.7 kg)  ?12/30/20 272 lb 0.4 oz (123.4 kg)  ? ? ? ?There are no preventive care reminders to display for this patient. ? ? ?There are no preventive care reminders to display for this patient. ? ?Lab Results  ?Component Value Date  ? TSH 2.720 11/23/2019  ? ?Lab Results  ?Component Value Date  ? WBC 6.9 12/30/2020  ? HGB 16.2 12/30/2020  ? HCT 48.3 12/30/2020  ? MCV 92 12/30/2020  ? PLT 266 12/30/2020  ? ?Lab Results  ?Component Value Date  ? NA 143 12/30/2020  ? K 3.8 12/30/2020  ? CO2 24 12/30/2020  ? GLUCOSE 93 12/30/2020  ? BUN 11  12/30/2020  ? CREATININE 0.87 12/30/2020  ? BILITOT 0.3 12/30/2020  ? ALKPHOS 54 12/30/2020  ? AST 25 12/30/2020  ? ALT 42 12/30/2020  ? PROT 6.8 12/30/2020  ? ALBUMIN 4.3 12/30/2020  ? CALCIUM 9.2 12/30/2020  ? ANIONGAP 9

## 2021-05-18 LAB — COMP. METABOLIC PANEL (12)
AST: 29 IU/L (ref 0–40)
Albumin/Globulin Ratio: 1.7 (ref 1.2–2.2)
Albumin: 4.7 g/dL (ref 4.0–5.0)
Alkaline Phosphatase: 46 IU/L (ref 44–121)
BUN/Creatinine Ratio: 14 (ref 9–20)
BUN: 14 mg/dL (ref 6–20)
Bilirubin Total: 0.6 mg/dL (ref 0.0–1.2)
Calcium: 10.2 mg/dL (ref 8.7–10.2)
Chloride: 101 mmol/L (ref 96–106)
Creatinine, Ser: 1.02 mg/dL (ref 0.76–1.27)
Globulin, Total: 2.8 g/dL (ref 1.5–4.5)
Glucose: 91 mg/dL (ref 70–99)
Potassium: 4 mmol/L (ref 3.5–5.2)
Sodium: 141 mmol/L (ref 134–144)
Total Protein: 7.5 g/dL (ref 6.0–8.5)
eGFR: 99 mL/min/{1.73_m2} (ref >59)

## 2021-05-18 LAB — HSV(HERPES SIMPLEX VRS) I + II AB-IGG
HSV 1 Glycoprotein G Ab, IgG: 33 index — ABNORMAL HIGH (ref 0.00–0.90)
HSV 2 IgG, Type Spec: 2.53 index — ABNORMAL HIGH (ref 0.00–0.90)

## 2021-05-18 LAB — LIPID PANEL
Chol/HDL Ratio: 4.9 ratio (ref 0.0–5.0)
Cholesterol, Total: 201 mg/dL — ABNORMAL HIGH (ref 100–199)
HDL: 41 mg/dL (ref >39)
LDL Chol Calc (NIH): 135 mg/dL — ABNORMAL HIGH (ref 0–99)
Triglycerides: 140 mg/dL (ref 0–149)
VLDL Cholesterol Cal: 25 mg/dL (ref 5–40)

## 2021-05-18 LAB — CHLAMYDIA/GONOCOCCUS/TRICHOMONAS, NAA
Chlamydia by NAA: NEGATIVE
Gonococcus by NAA: NEGATIVE
Trich vag by NAA: NEGATIVE

## 2021-05-18 LAB — HEPATITIS C ANTIBODY: Hep C Virus Ab: NONREACTIVE

## 2021-05-18 LAB — HIV ANTIBODY (ROUTINE TESTING W REFLEX): HIV Screen 4th Generation wRfx: NONREACTIVE

## 2021-05-18 LAB — HSV-2 IGG SUPPLEMENTAL TEST

## 2021-05-18 LAB — RPR: RPR Ser Ql: NONREACTIVE

## 2021-08-10 ENCOUNTER — Other Ambulatory Visit: Payer: Self-pay | Admitting: Nurse Practitioner

## 2021-08-10 DIAGNOSIS — I10 Essential (primary) hypertension: Secondary | ICD-10-CM

## 2021-08-15 ENCOUNTER — Ambulatory Visit: Payer: 59 | Admitting: Nurse Practitioner

## 2021-08-30 ENCOUNTER — Ambulatory Visit: Payer: 59 | Admitting: Nurse Practitioner

## 2021-08-31 ENCOUNTER — Ambulatory Visit: Payer: 59 | Admitting: Nurse Practitioner

## 2021-10-13 ENCOUNTER — Ambulatory Visit (HOSPITAL_COMMUNITY)
Admission: EM | Admit: 2021-10-13 | Discharge: 2021-10-13 | Disposition: A | Discharge status: Home / Self Care | Payer: 59 | Attending: Family Medicine | Admitting: Family Medicine

## 2021-10-13 ENCOUNTER — Encounter (HOSPITAL_COMMUNITY): Payer: Self-pay

## 2021-10-13 DIAGNOSIS — J029 Acute pharyngitis, unspecified: Secondary | ICD-10-CM | POA: Insufficient documentation

## 2021-10-13 DIAGNOSIS — J069 Acute upper respiratory infection, unspecified: Secondary | ICD-10-CM | POA: Diagnosis present

## 2021-10-13 DIAGNOSIS — B04 Monkeypox: Secondary | ICD-10-CM | POA: Diagnosis not present

## 2021-10-13 DIAGNOSIS — R051 Acute cough: Secondary | ICD-10-CM | POA: Insufficient documentation

## 2021-10-13 DIAGNOSIS — R509 Fever, unspecified: Secondary | ICD-10-CM | POA: Diagnosis present

## 2021-10-13 DIAGNOSIS — Z20822 Contact with and (suspected) exposure to covid-19: Secondary | ICD-10-CM | POA: Insufficient documentation

## 2021-10-13 DIAGNOSIS — K529 Noninfective gastroenteritis and colitis, unspecified: Secondary | ICD-10-CM | POA: Insufficient documentation

## 2021-10-13 DIAGNOSIS — Z113 Encounter for screening for infections with a predominantly sexual mode of transmission: Secondary | ICD-10-CM | POA: Diagnosis not present

## 2021-10-13 DIAGNOSIS — R112 Nausea with vomiting, unspecified: Secondary | ICD-10-CM | POA: Insufficient documentation

## 2021-10-13 LAB — HIV ANTIBODY (ROUTINE TESTING W REFLEX): HIV Screen 4th Generation wRfx: NONREACTIVE

## 2021-10-13 LAB — POCT RAPID STREP A, ED / UC: Streptococcus, Group A Screen (Direct): NEGATIVE

## 2021-10-13 LAB — SARS CORONAVIRUS 2 (TAT 6-24 HRS): SARS Coronavirus 2: NEGATIVE

## 2021-10-13 MED ORDER — AMOXICILLIN 500 MG PO CAPS: 500.0000 mg | ORAL_CAPSULE | Freq: Three times a day (TID) | ORAL | 0 refills | Status: DC

## 2021-10-13 MED ORDER — ONDANSETRON HCL 4 MG PO TABS: 4.0000 mg | ORAL_TABLET | Freq: Three times a day (TID) | ORAL | 0 refills | Status: AC | PRN

## 2021-10-13 NOTE — ED Triage Notes (Signed)
Pt has fever, chills, nausea and body aches xfew days . Pt did receive a tdap yesterday. Pt states he has a friend whom has monkey pox

## 2021-10-13 NOTE — ED Provider Notes (Signed)
[facility]    CSN: [id number] Arrival date & time: 10/13/21  2992      History   Chief Complaint Chief Complaint  Patient presents with   Fever    Fever, nausea, body aches and chills  xfew days, pt has a friend that has been exposed to monkey pox    HPI Dayden Viverette is a 36 y.o. male.   36 year old male presents with fever, nausea, cough and congestion.  Patient indicates for the past 3 to 4 days he has been having increasing symptoms of fever, intermittent, ranging about 100-1 01.  Patient indicates he has also had some cough, chest congestion, with production being purulent and green.  He indicates he is also had upper respiratory symptoms with sinus congestion, postnasal drip, rhinitis again with purulent green production.  Patient indicates that he is having body aches, chills, intermittent sweats.  He is also having nausea without vomiting, stomach cramping and does relate having some intermittent diarrhea with loose stools.  Patient indicates that he received the Tdap vaccine yesterday and his symptoms seem to have gotten worse.  Patient indicates that he is tired and fatigued.  He indicates that he has not been around any friends or family that have been sick but he does work at [employer], hospital location. Patient does indicate that he was with some friends last week and one of the friends indicated that he had and was diagnosed with monkeypox.  Patient indicates he does not know if his friend was having symptoms at that time, they did have sexual contact and intercourse, unprotected.  Patient indicates that he has not noticed any rash or unusual lesions over the past several days.  Patient does request to have STI screening, he is not having any symptoms of penile discharge or dysuria.  Patient also request to have RPR and HIV screen.   Fever Associated symptoms: nausea, rhinorrhea and sore throat     Past Medical History:  Diagnosis Date   Anxiety     Asthma    Coronavirus infection 01/2020   COVID    Dizziness    GERD (gastroesophageal reflux disease)    Hypertension    Seasonal allergies    Vertigo    Vitamin D deficiency     Patient Active Problem List   Diagnosis Date Noted   Mild intermittent asthma without complication 09/04/2019   Obesity (BMI 35.0-39.9 without comorbidity) 09/04/2019   Benign paroxysmal positional vertigo 07/23/2017   TMJ (temporomandibular joint disorder) 07/23/2017   Gastroesophageal reflux disease without esophagitis 07/23/2017   History of herniated intervertebral disc 12/25/2016   Palpitations 12/25/2016   Anxiety and depression 12/06/2015   Chronic bilateral low back pain without sciatica 12/06/2015   Essential hypertension 07/08/2013    History reviewed. No pertinent surgical history.     Home Medications    Prior to Admission medications   Medication Sig Start Date End Date Taking? Authorizing Provider  amoxicillin (AMOXIL) 500 MG capsule Take 1 capsule (500 mg total) by mouth 3 (three) times daily. 10/13/21  Yes Ellsworth Lennox, PA-C  acetaminophen (TYLENOL) 500 MG tablet Take 1,000 mg by mouth every 6 (six) hours as needed for mild pain, moderate pain, fever or headache.    [provider]  albuterol (VENTOLIN HFA) 108 (90 Base) MCG/ACT inhaler Inhale 2 puffs into the lungs every 6 (six) hours as needed for wheezing or shortness of breath. 09/13/20   Barbette Merino, NP  Budesonide 90 MCG/ACT inhaler  Inhale 1 puff into the lungs 2 (two) times daily. 07/27/20   White, Elita Boone, NP  busPIRone (BUSPAR) 15 MG tablet Take 1 tablet (15 mg total) by mouth 2 (two) times daily. Patient not taking: Reported on 05/15/2021 02/12/19   Anders Simmonds, PA-C  cetirizine (ZYRTEC) 10 MG tablet Take 1 tablet (10 mg total) by mouth daily. 09/13/20   Barbette Merino, NP  cyclobenzaprine (FLEXERIL) 10 MG tablet Take 1 tablet (10 mg total) by mouth 3 (three) times daily as needed for muscle spasms. 09/13/20    Barbette Merino, NP  gabapentin (NEURONTIN) 300 MG capsule Take 1 capsule (300 mg total) by mouth at bedtime. 05/15/21 07/14/21  Barbette Merino, NP  hydrOXYzine (ATARAX/VISTARIL) 25 MG tablet Take 25 mg by mouth 3 (three) times daily. Patient not taking: Reported on 05/15/2021 05/19/20   [provider]  ibuprofen (ADVIL) 800 MG tablet Take 1 tablet (800 mg total) by mouth 3 (three) times daily. Patient not taking: Reported on 05/15/2021 10/28/20   Ivette Loyal, NP  lidocaine (LIDODERM) 5 % Place 1 patch onto the skin daily. Remove & Discard patch within 12 hours or as directed by MD 05/15/21   Barbette Merino, NP  Lidocaine HCl-Benzyl Alcohol (SALONPAS LIDOCAINE PLUS) 4-10 % CREA Apply 1 application. topically every 8 (eight) hours. 05/15/21   Barbette Merino, NP  losartan-hydrochlorothiazide (HYZAAR) 100-25 MG tablet TAKE 1 TABLET BY MOUTH EVERY DAY 08/11/21   Orion Crook I, NP  meclizine (ANTIVERT) 25 MG tablet Take 1 tablet (25 mg total) by mouth 3 (three) times daily as needed for dizziness. 11/23/19   Kallie Locks, FNP  Multiple Vitamin (MULTIVITAMIN WITH MINERALS) TABS tablet Take 1 tablet by mouth daily.    [provider]  omeprazole (PRILOSEC) 20 MG capsule Take 1 capsule (20 mg total) by mouth daily. 09/13/20   Barbette Merino, NP  ondansetron (ZOFRAN) 4 MG tablet Take 1 tablet (4 mg total) by mouth every 8 (eight) hours as needed for nausea or vomiting. 10/13/21   Ellsworth Lennox, PA-C  Vitamin D, Ergocalciferol, (DRISDOL) 1.25 MG (50000 UNIT) CAPS capsule TAKE 1 CAPSULE (50,000 UNITS TOTAL) BY MOUTH EVERY 7 (SEVEN) DAYS 01/31/21   Barbette Merino, NP    Family History Family History  Problem Relation Age of Onset   Diabetes Maternal Grandmother    Hypertension Maternal Grandmother    Diabetes Maternal Grandfather    Diabetes Paternal Grandmother    Heart disease Paternal Grandmother    Diabetes Paternal Grandfather     Social History Social History    Tobacco Use   Smoking status: Former   Smokeless tobacco: Never  Building services engineer Use: Never used  Substance Use Topics   Alcohol use: No   Drug use: Yes    Types: Marijuana     Allergies   Prednisone   Review of Systems Review of Systems  Constitutional:  Positive for fever.  HENT:  Positive for postnasal drip, rhinorrhea, sinus pressure and sore throat.   Gastrointestinal:  Positive for nausea.     Physical Exam Triage Vital Signs ED Triage Vitals  Enc Vitals Group     BP 10/13/21 0844 (!) 145/89     Pulse Rate 10/13/21 0844 74     Resp 10/13/21 0844 12     Temp 10/13/21 0844 98.1 F (36.7 C)     Temp src --      SpO2 10/13/21 0844 96 %  Weight 10/13/21 0845 248 lb (112.5 kg)     Height 10/13/21 0845 5\' 11"  (1.803 m)     Head Circumference --      Peak Flow --      Pain Score 10/13/21 0845 7     Pain Loc --      Pain Edu? --      Excl. in GC? --    No data found.  Updated Vital Signs BP (!) 145/89 (BP Location: Left Arm)   Pulse 74   Temp 98.1 F (36.7 C)   Resp 12   Ht 5\' 11"  (1.803 m)   Wt 248 lb (112.5 kg)   SpO2 96%   BMI 34.59 kg/m   Visual Acuity Right Eye Distance:   Left Eye Distance:   Bilateral Distance:    Right Eye Near:   Left Eye Near:    Bilateral Near:     Physical Exam Constitutional:      Appearance: Normal appearance.  HENT:     Right Ear: Ear canal normal.     Left Ear: Ear canal normal. Tympanic membrane is injected.     Mouth/Throat:     Mouth: Mucous membranes are moist.     Pharynx: Posterior oropharyngeal erythema present. No pharyngeal swelling or oropharyngeal exudate.  Cardiovascular:     Rate and Rhythm: Normal rate and regular rhythm.     Heart sounds: Normal heart sounds.  Pulmonary:     Effort: Pulmonary effort is normal.     Breath sounds: Normal breath sounds and air entry. No wheezing, rhonchi or rales.  Abdominal:     General: Abdomen is flat. Bowel sounds are normal.     Palpations:  Abdomen is soft.     Tenderness: There is generalized abdominal tenderness. There is no guarding or rebound.  Lymphadenopathy:     Cervical: No cervical adenopathy.  Skin:    Comments: Skin: There are no unusual rashes, bumps, pustules or papules that are noted on the skin-anterior posterior trunk, upper and lower extremities.  Neurological:     Mental Status: He is alert.      UC Treatments / Results  Labs (all labs ordered are listed, but only abnormal results are displayed) Labs Reviewed  SARS CORONAVIRUS 2 (TAT 6-24 HRS)  CULTURE, GROUP A STREP ([facility])  RPR  HIV ANTIBODY (ROUTINE TESTING W REFLEX)  POCT RAPID STREP A, ED / UC  CYTOLOGY, (ORAL, ANAL, URETHRAL) ANCILLARY ONLY    EKG   Radiology No results found.  Procedures Procedures (including critical care time)  Medications Ordered in UC Medications - No data to display  Initial Impression / Assessment and Plan / UC Course  I have reviewed the triage vital signs and the nursing notes.  Pertinent labs & imaging results that were available during my care of the patient were reviewed by me and considered in my medical decision making (see chart for details).    Plan: 1.  STI testing to include RPR and HIV is pending. 2.  COVID test is pending. 3.  Advised to take the Zofran 1 every 6-8 hours as needed for nausea and stomach cramping. 4.  Advised take Mucinex DM for cough and congestion. 5.  Advised take Amoxil 500 mg 1 3 times daily until completed to treat the respiratory infection. 6.  Advised to follow-up with PCP or return to urgent care if symptoms fail to improve. Final Clinical Impressions(s) / UC Diagnoses   Final diagnoses:  Viral upper respiratory  tract infection  Fever, unspecified  Nausea and vomiting, unspecified vomiting type  Sore throat  Acute cough  Routine screening for STI (sexually transmitted infection)     Discharge Instructions      Screening labs have been performed and results  will be completed in 48 hours.  COVID swab has been taken and results will be completed in 48 hours.  If you do not get a call from this office that indicates that the lab test are negative or normal.  You can go on MyChart to review the results when they post at 24 and 48 hours. Advised to take the Zofran 1 every 6 hours to help decrease the nausea and stomach cramping. Advised take Mucinex DM every 6 hours to help control cough and congestion. Advised take Amoxil 500 mg 1 every 8 hours until completed. Advised to follow-up with PCP or return to urgent care if symptoms fail to improve.    ED Prescriptions     Medication Sig Dispense Auth. Provider   ondansetron (ZOFRAN) 4 MG tablet Take 1 tablet (4 mg total) by mouth every 8 (eight) hours as needed for nausea or vomiting. 20 tablet Ellsworth Lennox, PA-C   amoxicillin (AMOXIL) 500 MG capsule Take 1 capsule (500 mg total) by mouth 3 (three) times daily. 21 capsule Ellsworth Lennox, PA-C      PDMP not reviewed this encounter.   Ellsworth Lennox, PA-C 10/13/21 [phone]

## 2021-10-13 NOTE — Discharge Instructions (Addendum)
Screening labs have been performed and results will be completed in 48 hours.  COVID swab has been taken and results will be completed in 48 hours.  If you do not get a call from this office that indicates that the lab test are negative or normal.  You can go on MyChart to review the results when they post at 24 and 48 hours. Advised to take the Zofran 1 every 6 hours to help decrease the nausea and stomach cramping. Advised take Mucinex DM every 6 hours to help control cough and congestion. Advised take Amoxil 500 mg 1 every 8 hours until completed. Advised to follow-up with PCP or return to urgent care if symptoms fail to improve.

## 2021-10-14 LAB — RPR: RPR Ser Ql: NONREACTIVE

## 2021-10-15 LAB — CULTURE, GROUP A STREP (THRC)

## 2021-10-16 LAB — CYTOLOGY, (ORAL, ANAL, URETHRAL) ANCILLARY ONLY
Chlamydia: NEGATIVE
Comment: NEGATIVE
Comment: NEGATIVE
Comment: NORMAL
Neisseria Gonorrhea: NEGATIVE
Trichomonas: NEGATIVE

## 2021-10-20 ENCOUNTER — Ambulatory Visit: Payer: 59 | Admitting: Nurse Practitioner

## 2021-11-06 ENCOUNTER — Other Ambulatory Visit: Payer: Self-pay | Admitting: Nurse Practitioner

## 2021-11-06 DIAGNOSIS — J4520 Mild intermittent asthma, uncomplicated: Secondary | ICD-10-CM

## 2021-11-08 ENCOUNTER — Other Ambulatory Visit: Payer: Self-pay | Admitting: Nurse Practitioner

## 2021-11-08 DIAGNOSIS — J4520 Mild intermittent asthma, uncomplicated: Secondary | ICD-10-CM

## 2021-12-13 ENCOUNTER — Other Ambulatory Visit: Payer: Self-pay | Admitting: Nurse Practitioner

## 2021-12-22 ENCOUNTER — Other Ambulatory Visit: Payer: Self-pay | Admitting: Nurse Practitioner

## 2021-12-22 ENCOUNTER — Telehealth: Payer: Self-pay | Admitting: Nurse Practitioner

## 2021-12-22 DIAGNOSIS — I10 Essential (primary) hypertension: Secondary | ICD-10-CM

## 2021-12-22 MED ORDER — LOSARTAN POTASSIUM-HCTZ 100-25 MG PO TABS: 1.0000 | ORAL_TABLET | Freq: Every day | ORAL | 0 refills | Status: DC

## 2021-12-22 NOTE — Telephone Encounter (Signed)
Requesting high blood pressure meds [place] go [place] and needs them- leaves Sunday morning  Was last seen by [place] in march

## 2021-12-26 NOTE — Telephone Encounter (Signed)
Sent my chart message. KH 

## 2021-12-27 NOTE — Telephone Encounter (Signed)
Appt made. KH 

## 2022-01-08 ENCOUNTER — Encounter: Payer: Self-pay | Admitting: Nurse Practitioner

## 2022-01-08 ENCOUNTER — Ambulatory Visit (INDEPENDENT_AMBULATORY_CARE_PROVIDER_SITE_OTHER): Payer: Self-pay | Admitting: Nurse Practitioner

## 2022-01-08 VITALS — BP 133/77 | HR 57 | Temp 98.3°F | Ht 71.0 in | Wt 245.6 lb

## 2022-01-08 DIAGNOSIS — G8929 Other chronic pain: Secondary | ICD-10-CM

## 2022-01-08 DIAGNOSIS — E669 Obesity, unspecified: Secondary | ICD-10-CM

## 2022-01-08 DIAGNOSIS — Z7253 High risk bisexual behavior: Secondary | ICD-10-CM

## 2022-01-08 DIAGNOSIS — M25511 Pain in right shoulder: Secondary | ICD-10-CM

## 2022-01-08 DIAGNOSIS — I10 Essential (primary) hypertension: Secondary | ICD-10-CM

## 2022-01-08 DIAGNOSIS — K219 Gastro-esophageal reflux disease without esophagitis: Secondary | ICD-10-CM

## 2022-01-08 MED ORDER — LOSARTAN POTASSIUM-HCTZ 100-25 MG PO TABS: 1.0000 | ORAL_TABLET | Freq: Every day | ORAL | 0 refills | Status: DC

## 2022-01-08 MED ORDER — OMEPRAZOLE 20 MG PO CPDR: 20.0000 mg | DELAYED_RELEASE_CAPSULE | Freq: Every day | ORAL | 0 refills | Status: DC

## 2022-01-08 MED ORDER — CYCLOBENZAPRINE HCL 5 MG PO TABS: 5.0000 mg | ORAL_TABLET | Freq: Three times a day (TID) | ORAL | 0 refills | Status: DC | PRN

## 2022-01-08 MED ORDER — IBUPROFEN 800 MG PO TABS: 800.0000 mg | ORAL_TABLET | Freq: Three times a day (TID) | ORAL | 0 refills | Status: DC

## 2022-01-08 NOTE — Assessment & Plan Note (Signed)
Wt Readings from Last 3 Encounters:  01/08/22 245 lb 9.6 oz (111.4 kg)  10/13/21 248 lb (112.5 kg)  05/15/21 268 lb 9.6 oz (121.8 kg)   Need to increase intake of whole food consisting mainly vegetables and protein less carbohydrate drinking at least 64 ounces of water daily engaging in regular moderate to vigorous exercises at least 150 minutes weekly discussed with the patient.

## 2022-01-08 NOTE — Patient Instructions (Signed)
1. Chronic right shoulder pain  - DG Shoulder Right; Future - ibuprofen (ADVIL) 800 MG tablet; Take 1 tablet (800 mg total) by mouth 3 (three) times daily.  Dispense: 21 tablet; Refill: 0 - CMP14+EGFR - cyclobenzaprine (FLEXERIL) 5 MG tablet; Take 1 tablet (5 mg total) by mouth 3 (three) times daily as needed for muscle spasms.  Dispense: 30 tablet; Refill: 0  2. Essential hypertension  - losartan-hydrochlorothiazide (HYZAAR) 100-25 MG tablet; Take 1 tablet by mouth daily.  Dispense: 90 tablet; Refill: 0 - CMP14+EGFR    4. Gastroesophageal reflux disease without esophagitis  - omeprazole (PRILOSEC) 20 MG capsule; Take 1 capsule (20 mg total) by mouth daily.  Dispense: 90 capsule; Refill: 0  It is important that you exercise regularly at least 30 minutes 5 times a week as tolerated  Think about what you will eat, plan ahead. Choose " clean, green, fresh or frozen" over canned, processed or packaged foods which are more sugary, salty and fatty. 70 to 75% of food eaten should be vegetables and fruit. Three meals at set times with snacks allowed between meals, but they must be fruit or vegetables. Aim to eat over a 12 hour period , example 7 am to 7 pm, and STOP after  your last meal of the day. Drink water,generally about 64 ounces per day, no other drink is as healthy. Fruit juice is best enjoyed in a healthy way, by EATING the fruit.  Thanks for choosing [facility] we consider it a privelige to serve you.

## 2022-01-08 NOTE — Assessment & Plan Note (Signed)
Chronic condition takes BC powder as needed Ibuprofen 800 mg 3 times daily as needed, Flexeril 5 mg 3 times daily as needed ordered Toradol 60 mg IM injection given in the office today Will obtain x-ray of the right shoulder

## 2022-01-08 NOTE — Assessment & Plan Note (Signed)
BP Readings from Last 3 Encounters:  01/08/22 133/77  10/13/21 (!) 145/89  05/15/21 134/90  Chronic medical condition well-controlled on losartan-hydrochlorothiazide 100-25 mg 1 tablet daily Medication refilled Patient counseled on DASH diet encouraged to engage in regular moderate to vigorous exercises at least 150 minutes weekly Check CMP

## 2022-01-08 NOTE — Progress Notes (Signed)
Established Patient Office Visit  Subjective:  Patient ID: Walter Howell, male    DOB: 26-Sep-1985  Age: 36 y.o. MRN: [id number]  CC: No chief complaint on file.   HPI Walter Howell is a 36 y.o. male with past medical history of hypertension, mild intermittent asthma without complication, GERD, obesity, herniated intervertebral disc, chronic right shoulder pain presents for follow-up for his chronic medical conditions   Hypertension.  Currently on Losartan hydrochlorothiazide 100-25 mg 1 tablet daily patient denies chest pain, edema, syncope.    Patient complains of chronic right shoulder pain, has sharp pain, numbness tingling radiating from his right shoulder down his right arm, currently rates his pain as 7/10, takes OTC BC powder as needed states the [facility] powder helps his pain.  He has tried gabapentin in the past but it did not help.    Has multiple male sexual partners , patient denies penile discharge, rashes, dysuria .  He would like STD testing .   Past Medical History:  Diagnosis Date   Anxiety    Asthma    Coronavirus infection 01/2020   COVID    Dizziness    GERD (gastroesophageal reflux disease)    Hypertension    Seasonal allergies    Vertigo    Vitamin D deficiency     No past surgical history on file.  Family History  Problem Relation Age of Onset   Diabetes Maternal Grandmother    Hypertension Maternal Grandmother    Diabetes Maternal Grandfather    Diabetes Paternal Grandmother    Heart disease Paternal Grandmother    Diabetes Paternal Grandfather     Social History   Socioeconomic History   Marital status: Significant Other    Spouse name: Not on file   Number of children: Not on file   Years of education: Not on file   Highest education level: Not on file  Occupational History   Not on file  Tobacco Use   Smoking status: Former   Smokeless tobacco: Never  Vaping Use   Vaping Use: Never used  Substance and Sexual Activity    Alcohol use: No   Drug use: Yes    Types: Marijuana   Sexual activity: Yes  Other Topics Concern   Not on file  Social History Narrative   Not on file   Social Determinants of Health   Financial Resource Strain: Not on file  Food Insecurity: Not on file  Transportation Needs: Not on file  Physical Activity: Not on file  Stress: Not on file  Social Connections: Not on file  Intimate Partner Violence: Not on file    Outpatient Medications Prior to Visit  Medication Sig Dispense Refill   acetaminophen (TYLENOL) 500 MG tablet Take 1,000 mg by mouth every 6 (six) hours as needed for mild pain, moderate pain, fever or headache.     albuterol (VENTOLIN HFA) 108 (90 Base) MCG/ACT inhaler TAKE 2 PUFFS BY MOUTH EVERY 6 HOURS AS NEEDED FOR WHEEZE OR SHORTNESS OF BREATH 18 each 11   cetirizine (ZYRTEC) 10 MG tablet Take 1 tablet (10 mg total) by mouth daily. 90 tablet 3   lidocaine (LIDODERM) 5 % Place 1 patch onto the skin daily. Remove & Discard patch within 12 hours or as directed by MD 30 patch 0   meclizine (ANTIVERT) 25 MG tablet Take 1 tablet (25 mg total) by mouth 3 (three) times daily as needed for dizziness. 90 tablet 6   Multiple Vitamin (MULTIVITAMIN WITH MINERALS)  TABS tablet Take 1 tablet by mouth daily.     Vitamin D, Ergocalciferol, (DRISDOL) 1.25 MG (50000 UNIT) CAPS capsule TAKE 1 CAPSULE (50,000 UNITS TOTAL) BY MOUTH EVERY 7 (SEVEN) DAYS 13 capsule 1   cyclobenzaprine (FLEXERIL) 10 MG tablet Take 1 tablet (10 mg total) by mouth 3 (three) times daily as needed for muscle spasms. 30 tablet 3   losartan-hydrochlorothiazide (HYZAAR) 100-25 MG tablet Take 1 tablet by mouth daily. 30 tablet 0   omeprazole (PRILOSEC) 20 MG capsule TAKE 1 CAPSULE BY MOUTH EVERY DAY 90 capsule 3   amoxicillin (AMOXIL) 500 MG capsule Take 1 capsule (500 mg total) by mouth 3 (three) times daily. (Patient not taking: Reported on 01/08/2022) 21 capsule 0   Budesonide 90 MCG/ACT inhaler Inhale 1 puff  into the lungs 2 (two) times daily. (Patient not taking: Reported on 01/08/2022) 1 each 0   busPIRone (BUSPAR) 15 MG tablet Take 1 tablet (15 mg total) by mouth 2 (two) times daily. (Patient not taking: Reported on 05/15/2021) 60 tablet 3   gabapentin (NEURONTIN) 300 MG capsule Take 1 capsule (300 mg total) by mouth at bedtime. (Patient not taking: Reported on 01/08/2022) 60 capsule 1   hydrOXYzine (ATARAX/VISTARIL) 25 MG tablet Take 25 mg by mouth 3 (three) times daily. (Patient not taking: Reported on 05/15/2021)     Lidocaine HCl-Benzyl Alcohol (SALONPAS LIDOCAINE PLUS) 4-10 % CREA Apply 1 application. topically every 8 (eight) hours. (Patient not taking: Reported on 01/08/2022) 85 g 1   ondansetron (ZOFRAN) 4 MG tablet Take 1 tablet (4 mg total) by mouth every 8 (eight) hours as needed for nausea or vomiting. (Patient not taking: Reported on 01/08/2022) 20 tablet 0   ibuprofen (ADVIL) 800 MG tablet Take 1 tablet (800 mg total) by mouth 3 (three) times daily. (Patient not taking: Reported on 05/15/2021) 21 tablet 0   No facility-administered medications prior to visit.    Allergies  Allergen Reactions   Prednisone     "makes him feel weird"    ROS Review of Systems  Constitutional: Negative.   Respiratory:  Negative for apnea, cough, choking, shortness of breath and wheezing.   Cardiovascular: Negative.  Negative for chest pain, palpitations and leg swelling.  Gastrointestinal: Negative.  Negative for abdominal distention and abdominal pain.  Genitourinary:  Negative for difficulty urinating, dysuria, flank pain, penile discharge, penile pain and penile swelling.  Musculoskeletal:        Right shoulder pain  Neurological:  Positive for numbness. Negative for tremors, seizures, syncope, speech difficulty and headaches.  Psychiatric/Behavioral: Negative.  Negative for agitation, behavioral problems, confusion, sleep disturbance and suicidal ideas.       Objective:    Physical  Exam Constitutional:      General: He is not in acute distress.    Appearance: He is obese. He is not ill-appearing, toxic-appearing or diaphoretic.  Eyes:     General: No scleral icterus.       Right eye: No discharge.        Left eye: No discharge.     Extraocular Movements: Extraocular movements intact.     Conjunctiva/sclera: Conjunctivae normal.     Pupils: Pupils are equal, round, and reactive to light.  Cardiovascular:     Rate and Rhythm: Normal rate and regular rhythm.     Pulses: Normal pulses.     Heart sounds: Normal heart sounds. No murmur heard.    No friction rub. No gallop.  Pulmonary:     Effort:  Pulmonary effort is normal. No respiratory distress.     Breath sounds: Normal breath sounds. No stridor. No wheezing, rhonchi or rales.  Chest:     Chest wall: No tenderness.  Abdominal:     General: There is no distension.     Palpations: Abdomen is soft.     Tenderness: There is no abdominal tenderness. There is no guarding.  Musculoskeletal:        General: Tenderness present. No swelling, deformity or signs of injury.     Right lower leg: No edema.     Left lower leg: No edema.     Comments: Has tenderness on range of motion of right shoulder skin warm and dry no swelling noted has palpable radial pulse.  Skin:    Capillary Refill: Capillary refill takes less than 2 seconds.  Neurological:     Mental Status: He is alert and oriented to person, place, and time.     Cranial Nerves: No cranial nerve deficit.     Motor: No weakness.     Gait: Gait normal.  Psychiatric:        Mood and Affect: Mood normal.        Behavior: Behavior normal.        Thought Content: Thought content normal.        Judgment: Judgment normal.     BP 133/77   Pulse (!) 57   Temp 98.3 F (36.8 C)   Ht _0  (1.803 m)   Wt 245 lb 9.6 oz (111.4 kg)   SpO2 96%   BMI 34.25 kg/m  Wt Readings from Last 3 Encounters:  01/08/22 245 lb 9.6 oz (111.4 kg)  10/13/21 248 lb (112.5 kg)   05/15/21 268 lb 9.6 oz (121.8 kg)    Lab Results  Component Value Date   TSH 2.720 11/23/2019   Lab Results  Component Value Date   WBC 6.9 12/30/2020   HGB 16.2 12/30/2020   HCT 48.3 12/30/2020   MCV 92 12/30/2020   PLT 266 12/30/2020   Lab Results  Component Value Date   NA 141 05/15/2021   K 4.0 05/15/2021   CO2 24 12/30/2020   GLUCOSE 91 05/15/2021   BUN 14 05/15/2021   CREATININE 1.02 05/15/2021   BILITOT 0.6 05/15/2021   ALKPHOS 46 05/15/2021   AST 29 05/15/2021   ALT 42 12/30/2020   PROT 7.5 05/15/2021   ALBUMIN 4.7 05/15/2021   CALCIUM 10.2 05/15/2021   ANIONGAP 9 01/18/2017   EGFR 99 05/15/2021   Lab Results  Component Value Date   CHOL 201 (H) 05/15/2021   Lab Results  Component Value Date   HDL 41 05/15/2021   Lab Results  Component Value Date   LDLCALC 135 (H) 05/15/2021   Lab Results  Component Value Date   TRIG 140 05/15/2021   Lab Results  Component Value Date   CHOLHDL 4.9 05/15/2021   Lab Results  Component Value Date   HGBA1C 5.5 11/10/2020      Assessment & Plan:   Problem List Items Addressed This Visit       Cardiovascular and Mediastinum   Essential hypertension (Chronic)    BP Readings from Last 3 Encounters:  01/08/22 133/77  10/13/21 (!) 145/89  05/15/21 134/90  Chronic medical condition well-controlled on losartan-hydrochlorothiazide 100-25 mg 1 tablet daily Medication refilled Patient counseled on DASH diet encouraged to engage in regular moderate to vigorous exercises at least 150 minutes weekly Check CMP  Relevant Medications   losartan-hydrochlorothiazide (HYZAAR) 100-25 MG tablet   Other Relevant Orders   CMP14+EGFR     Digestive   Gastroesophageal reflux disease without esophagitis    Omeprazole 20 mg daily refilled Avoid fried fatty foods spicy food, alcohol      Relevant Medications   omeprazole (PRILOSEC) 20 MG capsule     Other   Obesity (BMI 35.0-39.9 without comorbidity)    Wt  Readings from Last 3 Encounters:  01/08/22 245 lb 9.6 oz (111.4 kg)  10/13/21 248 lb (112.5 kg)  05/15/21 268 lb 9.6 oz (121.8 kg)  Need to increase intake of whole food consisting mainly vegetables and protein less carbohydrate drinking at least 64 ounces of water daily engaging in regular moderate to vigorous exercises at least 150 minutes weekly discussed with the patient.       Chronic right shoulder pain - Primary    Chronic condition takes BC powder as needed Ibuprofen 800 mg 3 times daily as needed, Flexeril 5 mg 3 times daily as needed ordered Toradol 60 mg IM injection given in the office today Will obtain x-ray of the right shoulder       Relevant Medications   ibuprofen (ADVIL) 800 MG tablet   cyclobenzaprine (FLEXERIL) 5 MG tablet   Other Relevant Orders   DG Shoulder Right   CMP14+EGFR   High risk bisexual behavior    Check RPR, Hep B, hep C, HIV panel GC chlamydia Need to avoid high risk sexual behavior discussed with the patient verbalized understanding      Relevant Orders   HepB+HepC+HIV Panel   Chlamydia/GC NAA, Confirmation   RPR    Meds ordered this encounter  Medications   losartan-hydrochlorothiazide (HYZAAR) 100-25 MG tablet    Sig: Take 1 tablet by mouth daily.    Dispense:  90 tablet    Refill:  0   ibuprofen (ADVIL) 800 MG tablet    Sig: Take 1 tablet (800 mg total) by mouth 3 (three) times daily.    Dispense:  21 tablet    Refill:  0   cyclobenzaprine (FLEXERIL) 5 MG tablet    Sig: Take 1 tablet (5 mg total) by mouth 3 (three) times daily as needed for muscle spasms.    Dispense:  30 tablet    Refill:  0   omeprazole (PRILOSEC) 20 MG capsule    Sig: Take 1 capsule (20 mg total) by mouth daily.    Dispense:  90 capsule    Refill:  0    Follow-up: Return in about 3 months (around 04/10/2022) for CPE.    Renee Rival, FNP

## 2022-01-08 NOTE — Assessment & Plan Note (Signed)
Omeprazole 20 mg daily refilled Avoid fried fatty foods spicy food, alcohol

## 2022-01-08 NOTE — Assessment & Plan Note (Signed)
Check RPR, Hep B, hep C, HIV panel GC chlamydia Need to avoid high risk sexual behavior discussed with the patient verbalized understanding

## 2022-01-09 MED ORDER — KETOROLAC TROMETHAMINE 30 MG/ML IJ SOLN
30.0000 mg | Freq: Once | INTRAMUSCULAR | Status: AC
  Administered 2022-01-08: 30 mg via INTRAMUSCULAR

## 2022-01-09 NOTE — Addendum Note (Signed)
Addended by: Renelda Loma on: 01/09/2022 12:38 PM   Modules accepted: Orders

## 2022-01-10 ENCOUNTER — Telehealth: Payer: Self-pay

## 2022-01-10 ENCOUNTER — Other Ambulatory Visit: Payer: Self-pay | Admitting: Family Medicine

## 2022-01-10 ENCOUNTER — Ambulatory Visit (INDEPENDENT_AMBULATORY_CARE_PROVIDER_SITE_OTHER): Payer: Self-pay | Admitting: Nurse Practitioner

## 2022-01-10 ENCOUNTER — Other Ambulatory Visit: Payer: Self-pay

## 2022-01-10 ENCOUNTER — Non-Acute Institutional Stay (HOSPITAL_COMMUNITY)
Admission: RE | Admit: 2022-01-10 | Discharge: 2022-01-10 | Disposition: A | Discharge status: Home / Self Care | Payer: Self-pay | Source: Ambulatory Visit | Attending: Internal Medicine | Admitting: Internal Medicine

## 2022-01-10 ENCOUNTER — Encounter: Payer: Self-pay | Admitting: Nurse Practitioner

## 2022-01-10 VITALS — BP 130/83 | HR 58 | Temp 97.6°F

## 2022-01-10 DIAGNOSIS — E875 Hyperkalemia: Secondary | ICD-10-CM

## 2022-01-10 DIAGNOSIS — R079 Chest pain, unspecified: Secondary | ICD-10-CM

## 2022-01-10 DIAGNOSIS — M25511 Pain in right shoulder: Secondary | ICD-10-CM

## 2022-01-10 DIAGNOSIS — G8929 Other chronic pain: Secondary | ICD-10-CM

## 2022-01-10 LAB — CMP14+EGFR
ALT: 32 IU/L (ref 0–44)
AST: 24 IU/L (ref 0–40)
Albumin/Globulin Ratio: 1.8 (ref 1.2–2.2)
Albumin: 4.8 g/dL (ref 4.1–5.1)
Alkaline Phosphatase: 47 IU/L (ref 44–121)
BUN/Creatinine Ratio: 14 (ref 9–20)
BUN: 12 mg/dL (ref 6–20)
Bilirubin Total: 0.6 mg/dL (ref 0.0–1.2)
CO2: 24 mmol/L (ref 20–29)
Calcium: 9.6 mg/dL (ref 8.7–10.2)
Chloride: 100 mmol/L (ref 96–106)
Creatinine, Ser: 0.84 mg/dL (ref 0.76–1.27)
Globulin, Total: 2.7 g/dL (ref 1.5–4.5)
Glucose: 67 mg/dL — ABNORMAL LOW (ref 70–99)
Potassium: 9.7 mmol/L (ref 3.5–5.2)
Sodium: 135 mmol/L (ref 134–144)
Total Protein: 7.5 g/dL (ref 6.0–8.5)
eGFR: 117 mL/min/{1.73_m2} (ref >59)

## 2022-01-10 LAB — POTASSIUM: Potassium: 3.9 mmol/L (ref 3.5–5.1)

## 2022-01-10 LAB — RPR: RPR Ser Ql: NONREACTIVE

## 2022-01-10 NOTE — Progress Notes (Signed)
Established Patient Office Visit  Subjective:  Patient ID: Walter Howell, male    DOB: 04/13/1985  Age: 36 y.o. MRN: [id number]  CC: No chief complaint on file.   HPI Walter Howell is a 36 y.o. male with past medical history of anxiety, asthma, GERD, hypertension, vitamin D deficiency who presents with complaints of chest pain.  Potassium level was found to be critically elevated on labs done yesterday.  Lab rechecked today came back normal.  However patient states that he has been having intermittent right sided chest pain, shortness of breath for the past 1 month.  States that he feels like something sitting on his chest.  Pain can be 10/10 when it occurs, laying down and staying calm relieves his pain.  Pain is usually located to the right side of his chest. He  is been treated for chronic right shoulder pain.  He was referred to cardiology a year ago but did not follow-up due to lack of insurance.  He is willing to be evaluated by cardiology at this time.  Patient denies use of illicit drugs. He currently has right sided chest pain rated 7/10.        Past Medical History:  Diagnosis Date   Anxiety    Asthma    Coronavirus infection 01/2020   COVID    Dizziness    GERD (gastroesophageal reflux disease)    Hypertension    Seasonal allergies    Vertigo    Vitamin D deficiency     No past surgical history on file.  Family History  Problem Relation Age of Onset   Diabetes Maternal Grandmother    Hypertension Maternal Grandmother    Diabetes Maternal Grandfather    Diabetes Paternal Grandmother    Heart disease Paternal Grandmother    Diabetes Paternal Grandfather     Social History   Socioeconomic History   Marital status: Significant Other    Spouse name: Not on file   Number of children: Not on file   Years of education: Not on file   Highest education level: Not on file  Occupational History   Not on file  Tobacco Use   Smoking status: Former    Smokeless tobacco: Never  Vaping Use   Vaping Use: Never used  Substance and Sexual Activity   Alcohol use: No   Drug use: Yes    Types: Marijuana   Sexual activity: Yes  Other Topics Concern   Not on file  Social History Narrative   Not on file   Social Determinants of Health   Financial Resource Strain: Not on file  Food Insecurity: Not on file  Transportation Needs: Not on file  Physical Activity: Not on file  Stress: Not on file  Social Connections: Not on file  Intimate Partner Violence: Not on file    Outpatient Medications Prior to Visit  Medication Sig Dispense Refill   acetaminophen (TYLENOL) 500 MG tablet Take 1,000 mg by mouth every 6 (six) hours as needed for mild pain, moderate pain, fever or headache.     albuterol (VENTOLIN HFA) 108 (90 Base) MCG/ACT inhaler TAKE 2 PUFFS BY MOUTH EVERY 6 HOURS AS NEEDED FOR WHEEZE OR SHORTNESS OF BREATH 18 each 11   cetirizine (ZYRTEC) 10 MG tablet Take 1 tablet (10 mg total) by mouth daily. 90 tablet 3   cyclobenzaprine (FLEXERIL) 5 MG tablet Take 1 tablet (5 mg total) by mouth 3 (three) times daily as needed for muscle spasms. 30 tablet  0   ibuprofen (ADVIL) 800 MG tablet Take 1 tablet (800 mg total) by mouth 3 (three) times daily. 21 tablet 0   losartan-hydrochlorothiazide (HYZAAR) 100-25 MG tablet Take 1 tablet by mouth daily. 90 tablet 0   Multiple Vitamin (MULTIVITAMIN WITH MINERALS) TABS tablet Take 1 tablet by mouth daily.     omeprazole (PRILOSEC) 20 MG capsule Take 1 capsule (20 mg total) by mouth daily. 90 capsule 0   Vitamin D, Ergocalciferol, (DRISDOL) 1.25 MG (50000 UNIT) CAPS capsule TAKE 1 CAPSULE (50,000 UNITS TOTAL) BY MOUTH EVERY 7 (SEVEN) DAYS 13 capsule 1   Budesonide 90 MCG/ACT inhaler Inhale 1 puff into the lungs 2 (two) times daily. (Patient not taking: Reported on 01/08/2022) 1 each 0   busPIRone (BUSPAR) 15 MG tablet Take 1 tablet (15 mg total) by mouth 2 (two) times daily. (Patient not taking: Reported on  05/15/2021) 60 tablet 3   gabapentin (NEURONTIN) 300 MG capsule Take 1 capsule (300 mg total) by mouth at bedtime. (Patient not taking: Reported on 01/08/2022) 60 capsule 1   hydrOXYzine (ATARAX/VISTARIL) 25 MG tablet Take 25 mg by mouth 3 (three) times daily. (Patient not taking: Reported on 05/15/2021)     lidocaine (LIDODERM) 5 % Place 1 patch onto the skin daily. Remove & Discard patch within 12 hours or as directed by MD (Patient not taking: Reported on 01/10/2022) 30 patch 0   Lidocaine HCl-Benzyl Alcohol (SALONPAS LIDOCAINE PLUS) 4-10 % CREA Apply 1 application. topically every 8 (eight) hours. (Patient not taking: Reported on 01/08/2022) 85 g 1   meclizine (ANTIVERT) 25 MG tablet Take 1 tablet (25 mg total) by mouth 3 (three) times daily as needed for dizziness. (Patient not taking: Reported on 01/10/2022) 90 tablet 6   ondansetron (ZOFRAN) 4 MG tablet Take 1 tablet (4 mg total) by mouth every 8 (eight) hours as needed for nausea or vomiting. (Patient not taking: Reported on 01/08/2022) 20 tablet 0   amoxicillin (AMOXIL) 500 MG capsule Take 1 capsule (500 mg total) by mouth 3 (three) times daily. (Patient not taking: Reported on 01/08/2022) 21 capsule 0   No facility-administered medications prior to visit.    Allergies  Allergen Reactions   Prednisone     "makes him feel weird"    ROS Review of Systems  Constitutional: Negative.   Respiratory:  Positive for shortness of breath. Negative for apnea, cough, choking, wheezing and stridor.   Cardiovascular:  Positive for chest pain. Negative for palpitations and leg swelling.  Gastrointestinal: Negative.   Musculoskeletal:  Positive for neck pain.       Right shoulder pain   Skin: Negative.   Neurological:  Negative for syncope, facial asymmetry, light-headedness, numbness and headaches.  Psychiatric/Behavioral:  Negative for agitation, behavioral problems, confusion and decreased concentration.       Objective:    Physical  Exam Constitutional:      General: He is not in acute distress.    Appearance: He is obese. He is not ill-appearing, toxic-appearing or diaphoretic.  Eyes:     General:        Right eye: No discharge.        Left eye: No discharge.     Extraocular Movements: Extraocular movements intact.     Conjunctiva/sclera: Conjunctivae normal.     Pupils: Pupils are equal, round, and reactive to light.  Cardiovascular:     Rate and Rhythm: Normal rate and regular rhythm.     Pulses: Normal pulses.  Heart sounds: Normal heart sounds. No murmur heard.    No friction rub. No gallop.     Comments: Right chest wall tenderness on palpation,  Pulmonary:     Effort: Pulmonary effort is normal. No respiratory distress.     Breath sounds: Normal breath sounds. No stridor. No wheezing, rhonchi or rales.  Chest:     Chest wall: No tenderness.  Abdominal:     General: There is no distension.     Palpations: Abdomen is soft.     Tenderness: There is no abdominal tenderness. There is no guarding.  Musculoskeletal:        General: Tenderness present. No swelling, deformity or signs of injury.     Right lower leg: No edema.     Left lower leg: No edema.     Comments: Tenderness on ROM of the right shoulder, skin warm and dry, has palpable radial pulse.   Skin:    Capillary Refill: Capillary refill takes less than 2 seconds.     Coloration: Skin is not jaundiced or pale.     Findings: No bruising or lesion.  Neurological:     Mental Status: He is alert and oriented to person, place, and time.     Cranial Nerves: No cranial nerve deficit.     Motor: No weakness.     Gait: Gait normal.  Psychiatric:        Mood and Affect: Mood normal.        Behavior: Behavior normal.        Thought Content: Thought content normal.        Judgment: Judgment normal.     BP 130/83 (BP Location: Left Arm)   Pulse (!) 58   Temp 97.6 F (36.4 C)   SpO2 97%  Wt Readings from Last 3 Encounters:  01/08/22 245 lb  9.6 oz (111.4 kg)  10/13/21 248 lb (112.5 kg)  05/15/21 268 lb 9.6 oz (121.8 kg)    Lab Results  Component Value Date   TSH 2.720 11/23/2019   Lab Results  Component Value Date   WBC 6.9 12/30/2020   HGB 16.2 12/30/2020   HCT 48.3 12/30/2020   MCV 92 12/30/2020   PLT 266 12/30/2020   Lab Results  Component Value Date   NA 135 01/09/2022   K 3.9 01/10/2022   CO2 24 01/09/2022   GLUCOSE 67 (L) 01/09/2022   BUN 12 01/09/2022   CREATININE 0.84 01/09/2022   BILITOT 0.6 01/09/2022   ALKPHOS 47 01/09/2022   AST 24 01/09/2022   ALT 32 01/09/2022   PROT 7.5 01/09/2022   ALBUMIN 4.8 01/09/2022   CALCIUM 9.6 01/09/2022   ANIONGAP 9 01/18/2017   EGFR 117 01/09/2022   Lab Results  Component Value Date   CHOL 201 (H) 05/15/2021   Lab Results  Component Value Date   HDL 41 05/15/2021   Lab Results  Component Value Date   LDLCALC 135 (H) 05/15/2021   Lab Results  Component Value Date   TRIG 140 05/15/2021   Lab Results  Component Value Date   CHOLHDL 4.9 05/15/2021   Lab Results  Component Value Date   HGBA1C 5.5 11/10/2020      Assessment & Plan:   Problem List Items Addressed This Visit       Other   Chest pain - Primary    Chest wall tenderness on the right side of the chest on palpation Pain most likely musculoskeletal in nature EKG done today  shows  SB with occasional PVS, non specific T wave abnormality. I have no concern for ischemia Will refer patient to cardiology for further evaluation.  Continue flexeril and ibuprofen as needed       Relevant Orders   Ambulatory referral to Cardiology   Chronic right shoulder pain    Xray of the shoulder is pending  Continue ibuprofen 877m TID PRN, flexeril 53mTID PRN  Application of heat and ice encouraged      Hyperkalemia    Lab Results  Component Value Date   K 3.9 01/10/2022   Lab Results  Component Value Date   NA 135 01/09/2022   K 3.9 01/10/2022   CO2 24 01/09/2022   GLUCOSE 67 (L)  01/09/2022   BUN 12 01/09/2022   CREATININE 0.84 01/09/2022   CALCIUM 9.6 01/09/2022   EGFR 117 01/09/2022   GFRNONAA 97 11/23/2019   Labs now normal.        No orders of the defined types were placed in this encounter.   Follow-up: No follow-ups on file.    FoRenee RivalFNP

## 2022-01-10 NOTE — Assessment & Plan Note (Addendum)
Chest wall tenderness on the right side of the chest on palpation Pain most likely musculoskeletal in nature EKG done today shows  SB with occasional PVS, non specific T wave abnormality. I have no concern for ischemia Will refer patient to cardiology for further evaluation.  Continue flexeril and ibuprofen as needed

## 2022-01-10 NOTE — Progress Notes (Signed)
The patient will come to the office this morning to have labs rechecked, states that he has intermittent chest pain but not currently.

## 2022-01-10 NOTE — Progress Notes (Addendum)
[facility] NOTE:  Provider: Edwin Dada FNP   Procedure: lab draw ( K+)   [facility] for repeat bloodwork. Labs drawn peripherally without incident. Potassium level resulted 3.9. Mitzi Davenport FNP notified. Pt discharged and escorted by staff to PCP visit with provider.

## 2022-01-10 NOTE — Patient Instructions (Signed)

## 2022-01-10 NOTE — Assessment & Plan Note (Signed)
Lab Results  Component Value Date   K 3.9 01/10/2022   Lab Results  Component Value Date   NA 135 01/09/2022   K 3.9 01/10/2022   CO2 24 01/09/2022   GLUCOSE 67 (L) 01/09/2022   BUN 12 01/09/2022   CREATININE 0.84 01/09/2022   CALCIUM 9.6 01/09/2022   EGFR 117 01/09/2022   GFRNONAA 97 11/23/2019   Labs now normal.

## 2022-01-10 NOTE — Telephone Encounter (Signed)
Error

## 2022-01-10 NOTE — Assessment & Plan Note (Signed)
Xray of the shoulder is pending  Continue ibuprofen 800mg  TID PRN, flexeril 5mg  TID PRN  Application of heat and ice encouraged

## 2022-01-10 NOTE — Progress Notes (Signed)
I have called the patient, he did not answer his call so I left a voice message for him to come to the office right away to have his potassium level rechecked. I also tried calling his alternate number but there was no answer.

## 2022-01-12 LAB — CHLAMYDIA/GC NAA, CONFIRMATION
Chlamydia trachomatis, NAA: NEGATIVE
Neisseria gonorrhoeae, NAA: NEGATIVE

## 2022-01-15 NOTE — Progress Notes (Signed)
Normal labs.

## 2022-01-16 ENCOUNTER — Telehealth: Payer: Self-pay

## 2022-01-16 ENCOUNTER — Other Ambulatory Visit: Payer: Self-pay

## 2022-01-16 NOTE — Telephone Encounter (Signed)
Called pt to advise of the insulin that was going to be sent in. Pt wanted to know why he needs the humalog 75/25 25 units  BID for 7 days and then increase to 30 units BID. Please advise [facility]

## 2022-01-24 ENCOUNTER — Ambulatory Visit: Payer: Self-pay | Attending: Cardiology | Admitting: Cardiology

## 2022-01-24 NOTE — Progress Notes (Deleted)
Error

## 2022-01-25 NOTE — Progress Notes (Signed)
error 

## 2022-02-01 ENCOUNTER — Ambulatory Visit (HOSPITAL_COMMUNITY)
Admission: RE | Admit: 2022-02-01 | Discharge: 2022-02-01 | Disposition: A | Discharge status: Home / Self Care | Payer: Self-pay | Source: Ambulatory Visit | Attending: Nurse Practitioner | Admitting: Nurse Practitioner

## 2022-02-01 DIAGNOSIS — G8929 Other chronic pain: Secondary | ICD-10-CM | POA: Insufficient documentation

## 2022-02-01 DIAGNOSIS — M25511 Pain in right shoulder: Secondary | ICD-10-CM | POA: Insufficient documentation

## 2022-02-03 NOTE — Progress Notes (Signed)
Negative for fracture and arthritis.

## 2022-02-05 ENCOUNTER — Other Ambulatory Visit: Payer: Self-pay | Admitting: Nurse Practitioner

## 2022-02-05 DIAGNOSIS — G8929 Other chronic pain: Secondary | ICD-10-CM

## 2022-02-05 MED ORDER — IBUPROFEN 800 MG PO TABS: 800.0000 mg | ORAL_TABLET | Freq: Three times a day (TID) | ORAL | 0 refills | Status: DC

## 2022-02-22 ENCOUNTER — Ambulatory Visit: Payer: Self-pay | Attending: Cardiology | Admitting: Cardiology

## 2022-04-06 IMAGING — DX DG CHEST 2V
2 series · 2 of 2 positions shown · non-contrast
Comparison: 04/21/2019

CLINICAL DATA: 34-year-old male with dyspnea

EXAM:
CHEST - 2 VIEW

[chest pa]
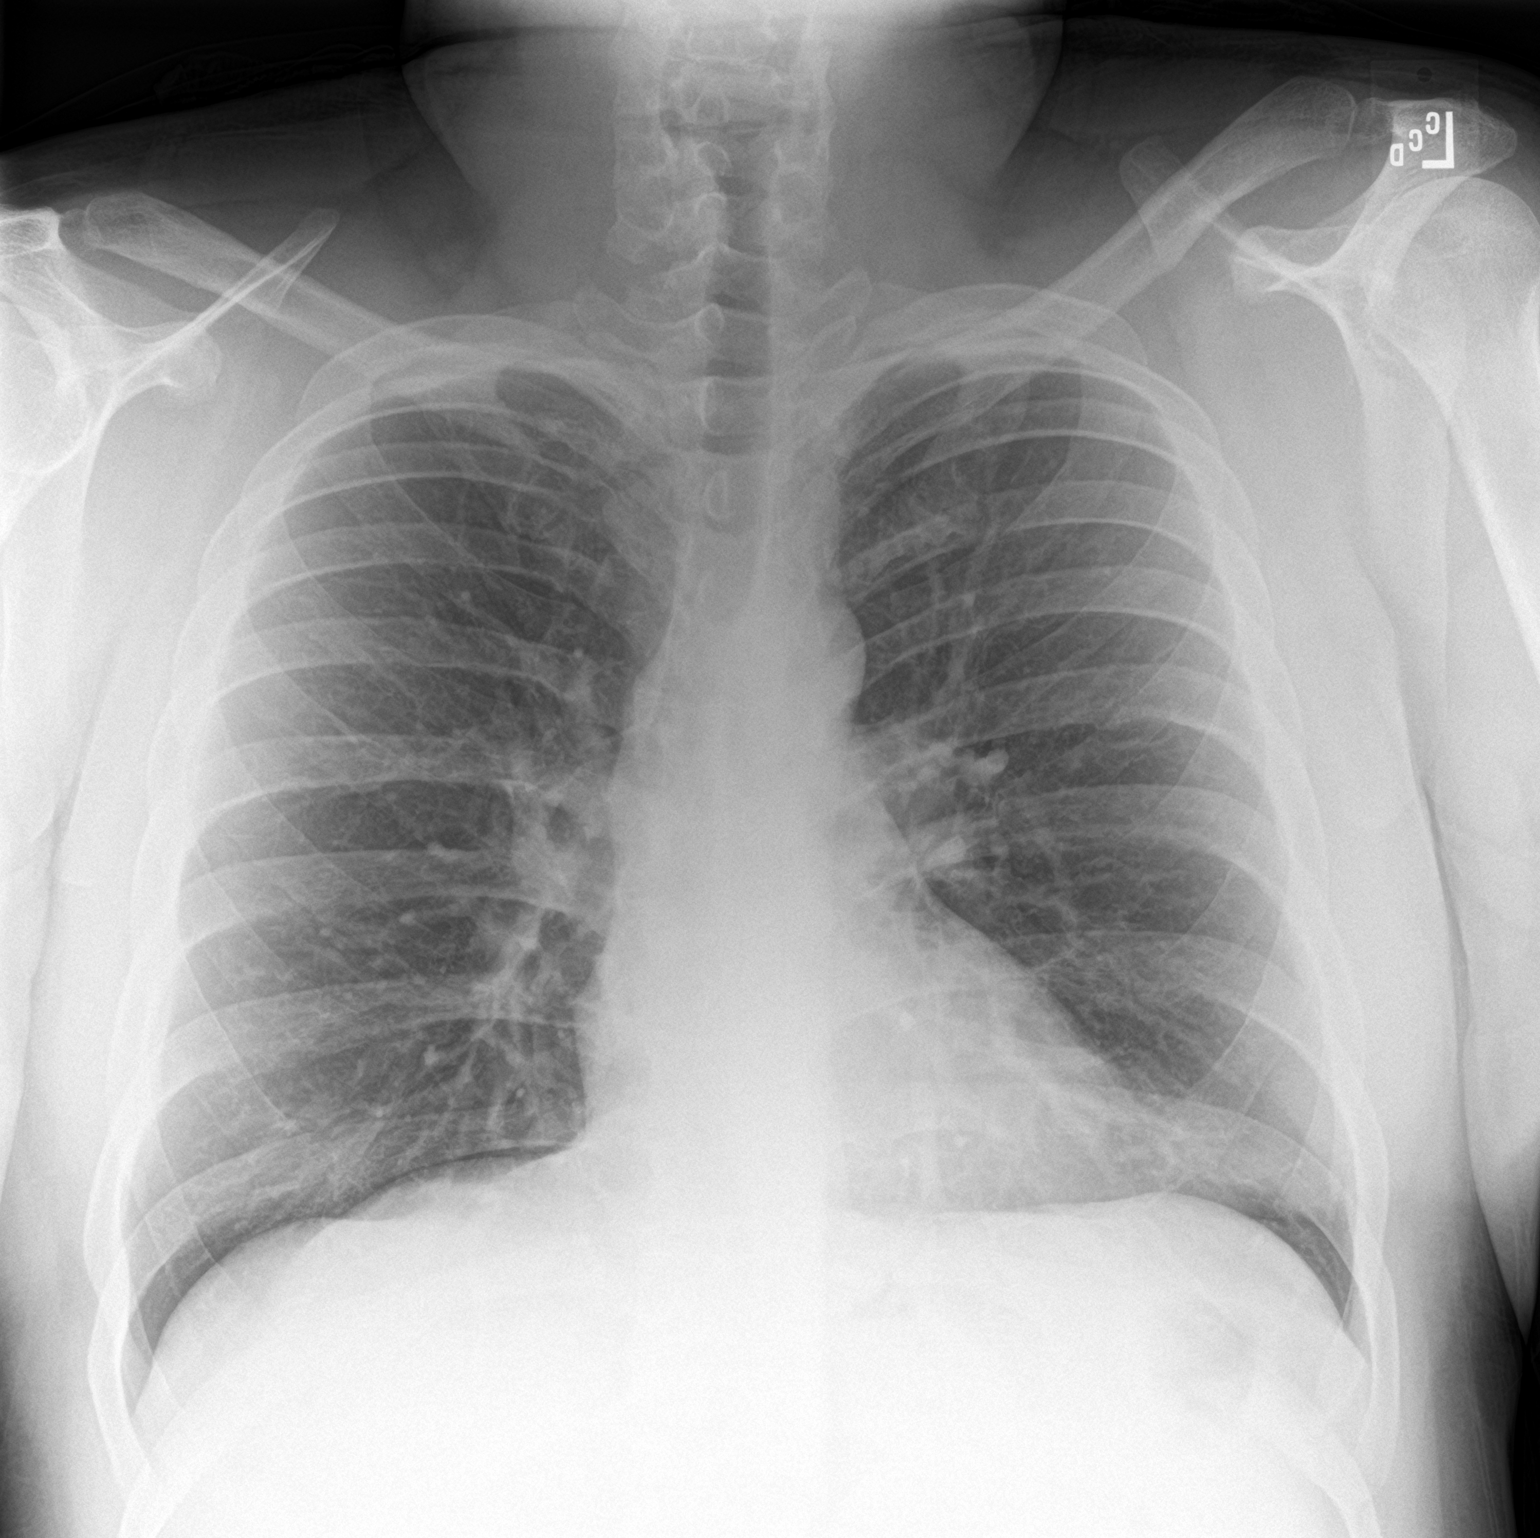

[chest lat]
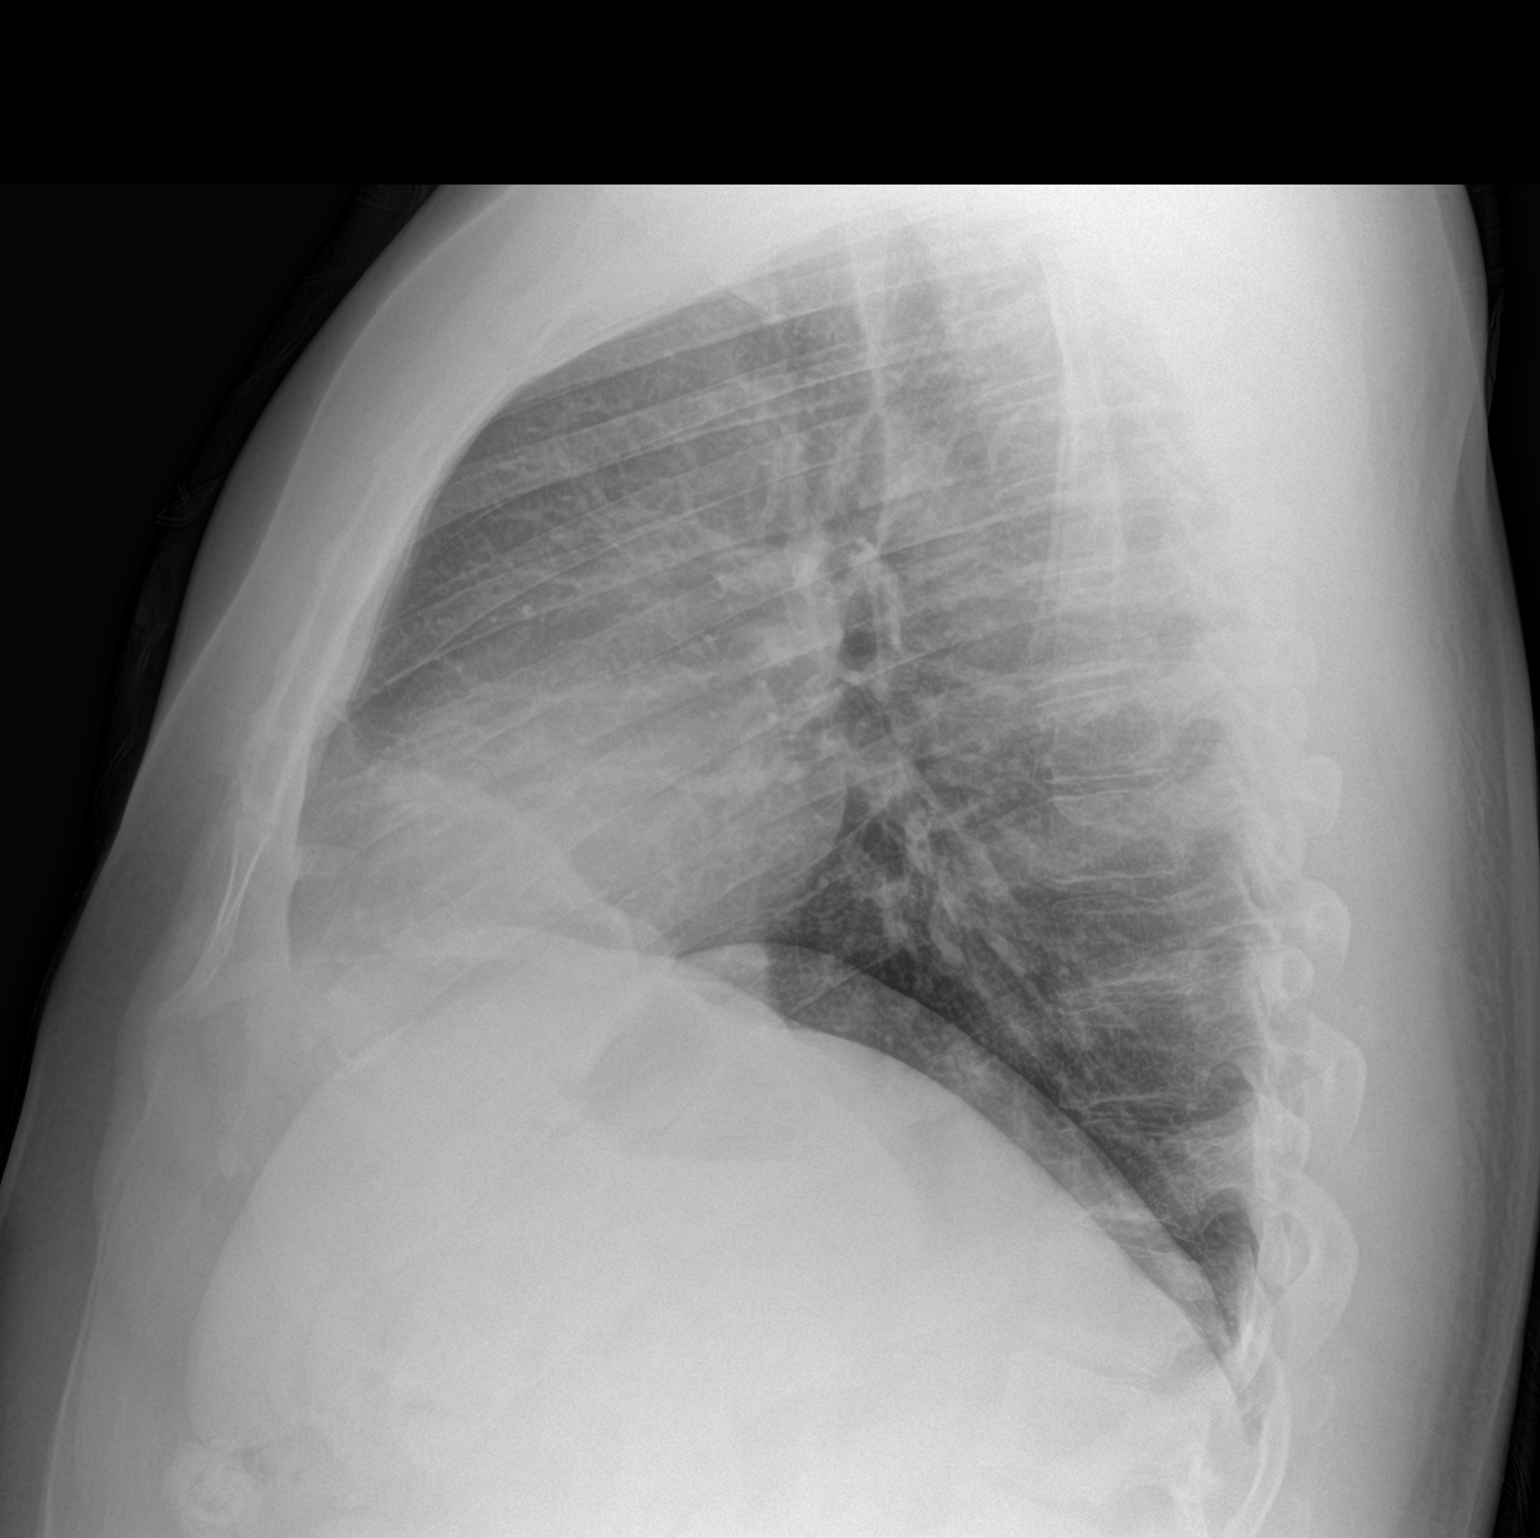

[2 of 2 positions shown; findings below may reference images not displayed]

FINDINGS: Cardiomediastinal silhouette unchanged in size and contour. No
evidence of central vascular congestion. No interlobular septal
thickening.

No pneumothorax or pleural effusion. Coarsened interstitial
markings, with no confluent airspace disease.

No acute displaced fracture. Degenerative changes of the spine.
IMPRESSION: No active cardiopulmonary disease.

## 2022-04-10 ENCOUNTER — Ambulatory Visit (INDEPENDENT_AMBULATORY_CARE_PROVIDER_SITE_OTHER): Payer: Self-pay | Admitting: Nurse Practitioner

## 2022-04-10 ENCOUNTER — Encounter: Payer: Self-pay | Admitting: Nurse Practitioner

## 2022-04-10 VITALS — BP 135/68 | HR 55 | Temp 97.7°F | Ht 71.0 in | Wt 245.8 lb

## 2022-04-10 DIAGNOSIS — Z0000 Encounter for general adult medical examination without abnormal findings: Secondary | ICD-10-CM

## 2022-04-10 DIAGNOSIS — Z1321 Encounter for screening for nutritional disorder: Secondary | ICD-10-CM

## 2022-04-10 DIAGNOSIS — Z13228 Encounter for screening for other metabolic disorders: Secondary | ICD-10-CM

## 2022-04-10 DIAGNOSIS — H538 Other visual disturbances: Secondary | ICD-10-CM

## 2022-04-10 DIAGNOSIS — M25511 Pain in right shoulder: Secondary | ICD-10-CM

## 2022-04-10 DIAGNOSIS — G8929 Other chronic pain: Secondary | ICD-10-CM

## 2022-04-10 DIAGNOSIS — Z1329 Encounter for screening for other suspected endocrine disorder: Secondary | ICD-10-CM

## 2022-04-10 DIAGNOSIS — I10 Essential (primary) hypertension: Secondary | ICD-10-CM

## 2022-04-10 DIAGNOSIS — F1290 Cannabis use, unspecified, uncomplicated: Secondary | ICD-10-CM

## 2022-04-10 DIAGNOSIS — Z7252 High risk homosexual behavior: Secondary | ICD-10-CM

## 2022-04-10 DIAGNOSIS — Z130 Encounter for screening for diseases of the blood and blood-forming organs and certain disorders involving the immune mechanism: Secondary | ICD-10-CM

## 2022-04-10 DIAGNOSIS — E669 Obesity, unspecified: Secondary | ICD-10-CM

## 2022-04-10 DIAGNOSIS — J4520 Mild intermittent asthma, uncomplicated: Secondary | ICD-10-CM

## 2022-04-10 MED ORDER — ALBUTEROL SULFATE HFA 108 (90 BASE) MCG/ACT IN AERS: INHALATION_SPRAY | RESPIRATORY_TRACT | 2 refills | Status: DC

## 2022-04-10 NOTE — Assessment & Plan Note (Addendum)
Has not needed albuterol inhaler in a while but would like medication to be refilled.  Medication refilled  - albuterol (VENTOLIN HFA) 108 (90 Base) MCG/ACT inhaler; TAKE 2 PUFFS BY MOUTH EVERY 6 HOURS AS NEEDED FOR WHEEZE OR SHORTNESS OF BREATH  Dispense: 18 each; Refill: 2

## 2022-04-10 NOTE — Assessment & Plan Note (Signed)
Need to avoid smoking marijuana including risk of addiction to illicit drugs discussed patient verbalized understanding.

## 2022-04-10 NOTE — Progress Notes (Signed)
Complete physical exam  Patient: Walter Howell   DOB: 1985-04-15   37 y.o. Male  MRN: [id number]  Subjective:    Chief Complaint  Patient presents with   Annual Exam    Walter Howell is a 37 y.o. male with past medical history of GERD, mild intermittent asthma without complication, essential hypertension, anxiety and depression, chronic right shoulder pain, obesity who presents today for a complete physical exam. He reports consuming a low fat and low sodium diet.  Does walking exercises 30 minutes 3 days a week.  Patient is requesting STD testing has 1 male sexual partner he would also like referral to infectious disease for PrEP.  Need to avoid high risk sexual behavior discussed with the patient today.   He stated that he is up-to-date with Tdap vaccine and flu vaccine     Most recent fall risk assessment:    05/15/2021    1:46 PM  [place] in the past year? 0  Number falls in past yr: 0  Injury with Fall? 0  Risk for fall due to : No Fall Risks  Follow up Falls evaluation completed     Most recent depression screenings:    04/10/2022    3:33 PM 01/08/2022    3:50 PM  PHQ 2/9 Scores  PHQ - 2 Score 0 0        Patient Care Team: Renee Rival, FNP as PCP - General (Nurse Practitioner) Leonie Man, MD as PCP - Cardiology (Cardiology)   Outpatient Medications Prior to Visit  Medication Sig Note   cetirizine (ZYRTEC) 10 MG tablet Take 1 tablet (10 mg total) by mouth daily.    cyclobenzaprine (FLEXERIL) 5 MG tablet Take 1 tablet (5 mg total) by mouth 3 (three) times daily as needed for muscle spasms.    hydrOXYzine (ATARAX/VISTARIL) 25 MG tablet Take 25 mg by mouth 3 (three) times daily.    ibuprofen (ADVIL) 800 MG tablet Take 1 tablet (800 mg total) by mouth 3 (three) times daily. 04/10/2022: Refill   lidocaine (LIDODERM) 5 % Place 1 patch onto the skin daily. Remove & Discard patch within 12 hours or as directed by MD 04/10/2022:  Refill    losartan-hydrochlorothiazide (HYZAAR) 100-25 MG tablet Take 1 tablet by mouth daily. 04/10/2022: Refill    Multiple Vitamin (MULTIVITAMIN WITH MINERALS) TABS tablet Take 1 tablet by mouth daily.    omeprazole (PRILOSEC) 20 MG capsule Take 1 capsule (20 mg total) by mouth daily. 04/10/2022: Refill    ondansetron (ZOFRAN) 4 MG tablet Take 1 tablet (4 mg total) by mouth every 8 (eight) hours as needed for nausea or vomiting.    [DISCONTINUED] albuterol (VENTOLIN HFA) 108 (90 Base) MCG/ACT inhaler TAKE 2 PUFFS BY MOUTH EVERY 6 HOURS AS NEEDED FOR WHEEZE OR SHORTNESS OF BREATH    [DISCONTINUED] Vitamin D, Ergocalciferol, (DRISDOL) 1.25 MG (50000 UNIT) CAPS capsule TAKE 1 CAPSULE (50,000 UNITS TOTAL) BY MOUTH EVERY 7 (SEVEN) DAYS    acetaminophen (TYLENOL) 500 MG tablet Take 1,000 mg by mouth every 6 (six) hours as needed for mild pain, moderate pain, fever or headache. (Patient not taking: Reported on 04/10/2022)    Budesonide 90 MCG/ACT inhaler Inhale 1 puff into the lungs 2 (two) times daily. (Patient not taking: Reported on 01/08/2022)    busPIRone (BUSPAR) 15 MG tablet Take 1 tablet (15 mg total) by mouth 2 (two) times daily. (Patient not taking: Reported on 05/15/2021)    gabapentin (  NEURONTIN) 300 MG capsule Take 1 capsule (300 mg total) by mouth at bedtime. (Patient not taking: Reported on 01/08/2022)    Lidocaine HCl-Benzyl Alcohol (SALONPAS LIDOCAINE PLUS) 4-10 % CREA Apply 1 application. topically every 8 (eight) hours. (Patient not taking: Reported on 01/08/2022)    meclizine (ANTIVERT) 25 MG tablet Take 1 tablet (25 mg total) by mouth 3 (three) times daily as needed for dizziness. (Patient not taking: Reported on 01/10/2022)    No facility-administered medications prior to visit.    Review of Systems  Constitutional: Negative.  Negative for chills, diaphoresis, fever, malaise/fatigue and weight loss.  HENT: Negative.  Negative for ear discharge, ear pain, hearing loss and tinnitus.    Eyes:  Positive for blurred vision. Negative for pain, discharge and redness.  Respiratory:  Negative for cough, hemoptysis, sputum production, shortness of breath and wheezing.   Cardiovascular: Negative.  Negative for chest pain, palpitations, orthopnea, claudication and leg swelling.  Gastrointestinal:  Negative for abdominal pain, constipation, diarrhea, heartburn, melena, nausea and vomiting.  Genitourinary: Negative.  Negative for dysuria, flank pain, frequency, hematuria and urgency.  Musculoskeletal:  Positive for joint pain. Negative for back pain and neck pain.  Skin: Negative.  Negative for itching and rash.  Neurological: Negative.  Negative for dizziness, tingling, tremors, sensory change, speech change, focal weakness and headaches.  Endo/Heme/Allergies: Negative.   Psychiatric/Behavioral: Negative.  Negative for depression, hallucinations, substance abuse and suicidal ideas. The patient is not nervous/anxious and does not have insomnia.           Objective:     BP 135/68   Pulse (!) 55   Temp 97.7 F (36.5 C)   Ht 5' 11"$  (1.803 m)   Wt 245 lb 12.8 oz (111.5 kg)   SpO2 100%   BMI 34.28 kg/m    Physical Exam Constitutional:      General: He is not in acute distress.    Appearance: Normal appearance. He is obese. He is not ill-appearing, toxic-appearing or diaphoretic.  HENT:     Right Ear: Tympanic membrane, ear canal and external ear normal. There is no impacted cerumen.     Left Ear: Tympanic membrane, ear canal and external ear normal. There is no impacted cerumen.     Nose: No congestion or rhinorrhea.     Mouth/Throat:     Mouth: Mucous membranes are moist.     Pharynx: Oropharynx is clear. No oropharyngeal exudate or posterior oropharyngeal erythema.  Eyes:     General: No scleral icterus.       Right eye: No discharge.        Left eye: No discharge.     Extraocular Movements: Extraocular movements intact.  Neck:     Vascular: No carotid bruit.   Cardiovascular:     Rate and Rhythm: Normal rate and regular rhythm.     Pulses: Normal pulses.     Heart sounds: Normal heart sounds. No murmur heard.    No friction rub. No gallop.  Pulmonary:     Effort: Pulmonary effort is normal. No respiratory distress.     Breath sounds: Normal breath sounds. No stridor. No wheezing, rhonchi or rales.  Chest:     Chest wall: No tenderness.  Abdominal:     General: There is no distension.     Palpations: Abdomen is soft. There is no mass.     Tenderness: There is no abdominal tenderness. There is no right CVA tenderness, left CVA tenderness, guarding or rebound.  Hernia: No hernia is present.  Musculoskeletal:        General: Tenderness present. No swelling, deformity or signs of injury.     Cervical back: Normal range of motion and neck supple. No rigidity or tenderness.     Right lower leg: No edema.     Left lower leg: No edema.     Comments: Tenderness on range of motion of right shoulder skin warm and dry no swelling or redness noted  Lymphadenopathy:     Cervical: No cervical adenopathy.  Skin:    General: Skin is warm and dry.     Capillary Refill: Capillary refill takes less than 2 seconds.     Coloration: Skin is not jaundiced or pale.     Findings: No bruising, erythema, lesion or rash.  Neurological:     Mental Status: He is alert and oriented to person, place, and time.     Cranial Nerves: No cranial nerve deficit.     Sensory: No sensory deficit.     Motor: No weakness.     Coordination: Coordination normal.     Gait: Gait normal.  Psychiatric:        Mood and Affect: Mood normal.        Behavior: Behavior normal.        Thought Content: Thought content normal.        Judgment: Judgment normal.      No results found for any visits on 04/10/22.     Assessment & Plan:    Routine Health Maintenance and Physical Exam  Immunization History  Administered Date(s) Administered   PFIZER(Purple Top)SARS-COV-2  Vaccination 05/24/2019, 08/10/2019    Health Maintenance  Topic Date Due   DTaP/Tdap/Td (1 - Tdap) Never done   INFLUENZA VACCINE  05/27/2022 (Originally 09/26/2021)   COVID-19 Vaccine (3 - 2023-24 season) 01/25/2023 (Originally 10/27/2021)   Hepatitis C Screening  Completed   HIV Screening  Completed   HPV VACCINES  Aged Out    Discussed health benefits of physical activity, and encouraged him to engage in regular exercise appropriate for his age and condition.  Problem List Items Addressed This Visit       Cardiovascular and Mediastinum   Essential hypertension (Chronic)    BP Readings from Last 3 Encounters:  04/10/22 135/68  01/10/22 130/83  01/08/22 133/77  Chronic medical condition currently well-controlled on losartan-hydrochlorothiazide 100-25 mg 1 tablet daily Continue current medications DASH diet advised engage in regular moderate to vigorous exercises at least 150 minutes weekly        Respiratory   Mild intermittent asthma without complication    Has not needed albuterol inhaler in a while but would like medication to be refilled.  Medication refilled  - albuterol (VENTOLIN HFA) 108 (90 Base) MCG/ACT inhaler; TAKE 2 PUFFS BY MOUTH EVERY 6 HOURS AS NEEDED FOR WHEEZE OR SHORTNESS OF BREATH  Dispense: 18 each; Refill: 2       Relevant Medications   albuterol (VENTOLIN HFA) 108 (90 Base) MCG/ACT inhaler     Other   Obesity with serious comorbidity    Wt Readings from Last 3 Encounters:  04/10/22 245 lb 12.8 oz (111.5 kg)  01/08/22 245 lb 9.6 oz (111.4 kg)  10/13/21 248 lb (112.5 kg)  Patient counseled on low-carb modified diet He was encouraged to engage in regular moderate to vigorous exercise with at least 150 minutes weekly Benefits of healthy weight discussed      Chronic right shoulder pain  He has been taking BC powder and apply lidocaine 5% patch as needed He declined referral to orthopedics. Stretching exercises, application of heat and ice  encouraged      Annual physical exam - Primary    Annual exam as documented.  Counseling done include healthy lifestyle involving committing to 150 minutes of exercise per week, heart healthy diet, and attaining healthy weight. The importance of adequate sleep also discussed.  Regular use of seat belt and home safety were also discussed . Changes in health habits are decided on by patient with goals and time frames set for achieving them. Immunization and cancer screening  needs are specifically addressed at this visit.  Stated that he is up-to-date with Tdap vaccine and flu vaccine. Routine labs ordered today      High risk homosexual behavior    Need to avoid high risk sexual behavior discussed - HepB+HepC+HIV Panel - Chlamydia/GC NAA, Confirmation - RPR - Ambulatory referral to Infectious Disease       Relevant Orders   HepB+HepC+HIV Panel   Chlamydia/GC NAA, Confirmation   RPR   Ambulatory referral to Infectious Disease   Marijuana user    Need to avoid smoking marijuana including risk of addiction to illicit drugs discussed patient verbalized understanding.       Blurry vision, bilateral    Patient complains of blurry vision Vision 20/25 in both eyes Patient referred to ophthalmology      Relevant Orders   Ambulatory referral to Ophthalmology   Other Visit Diagnoses     Screening for endocrine, nutritional, metabolic and immunity disorder       Relevant Orders   Lipid Panel   CBC with Differential   TSH   Vitamin D, 25-hydroxy   Hemoglobin A1c      Return in about 6 months (around 10/09/2022) for HTN.     Renee Rival, FNP

## 2022-04-10 NOTE — Patient Instructions (Signed)
It is important that you exercise regularly at least 30 minutes 5 times a week as tolerated  Think about what you will eat, plan ahead. Choose " clean, green, fresh or frozen" over canned, processed or packaged foods which are more sugary, salty and fatty. 70 to 75% of food eaten should be vegetables and fruit. Three meals at set times with snacks allowed between meals, but they must be fruit or vegetables. Aim to eat over a 12 hour period , example 7 am to 7 pm, and STOP after  your last meal of the day. Drink water,generally about 64 ounces per day, no other drink is as healthy. Fruit juice is best enjoyed in a healthy way, by EATING the fruit.  Thanks for choosing [facility] we consider it a privelige to serve you.

## 2022-04-10 NOTE — Assessment & Plan Note (Signed)
Need to avoid high risk sexual behavior discussed - HepB+HepC+HIV Panel - Chlamydia/GC NAA, Confirmation - RPR - Ambulatory referral to Infectious Disease

## 2022-04-10 NOTE — Assessment & Plan Note (Signed)
Patient complains of blurry vision Vision 20/25 in both eyes Patient referred to ophthalmology

## 2022-04-10 NOTE — Assessment & Plan Note (Signed)
Annual exam as documented.  Counseling done include healthy lifestyle involving committing to 150 minutes of exercise per week, heart healthy diet, and attaining healthy weight. The importance of adequate sleep also discussed.  Regular use of seat belt and home safety were also discussed . Changes in health habits are decided on by patient with goals and time frames set for achieving them. Immunization and cancer screening  needs are specifically addressed at this visit.  Stated that he is up-to-date with Tdap vaccine and flu vaccine. Routine labs ordered today

## 2022-04-10 NOTE — Assessment & Plan Note (Signed)
He has been taking BC powder and apply lidocaine 5% patch as needed He declined referral to orthopedics. Stretching exercises, application of heat and ice encouraged

## 2022-04-10 NOTE — Assessment & Plan Note (Signed)
BP Readings from Last 3 Encounters:  04/10/22 135/68  01/10/22 130/83  01/08/22 133/77  Chronic medical condition currently well-controlled on losartan-hydrochlorothiazide 100-25 mg 1 tablet daily Continue current medications DASH diet advised engage in regular moderate to vigorous exercises at least 150 minutes weekly

## 2022-04-10 NOTE — Assessment & Plan Note (Signed)
Wt Readings from Last 3 Encounters:  04/10/22 245 lb 12.8 oz (111.5 kg)  01/08/22 245 lb 9.6 oz (111.4 kg)  10/13/21 248 lb (112.5 kg)  Patient counseled on low-carb modified diet He was encouraged to engage in regular moderate to vigorous exercise with at least 150 minutes weekly Benefits of healthy weight discussed

## 2022-04-11 LAB — CBC WITH DIFFERENTIAL/PLATELET
Basophils Absolute: 0 10*3/uL (ref 0.0–0.2)
Basos: 1 %
EOS (ABSOLUTE): 0.1 10*3/uL (ref 0.0–0.4)
Eos: 1 %
Hematocrit: 46.1 % (ref 37.5–51.0)
Hemoglobin: 15.8 g/dL (ref 13.0–17.7)
Immature Grans (Abs): 0 10*3/uL (ref 0.0–0.1)
Immature Granulocytes: 0 %
Lymphocytes Absolute: 2.1 10*3/uL (ref 0.7–3.1)
Lymphs: 33 %
MCH: 31.3 pg (ref 26.6–33.0)
MCHC: 34.3 g/dL (ref 31.5–35.7)
MCV: 91 fL (ref 79–97)
Monocytes Absolute: 0.3 10*3/uL (ref 0.1–0.9)
Monocytes: 5 %
Neutrophils Absolute: 3.9 10*3/uL (ref 1.4–7.0)
Neutrophils: 60 %
Platelets: 219 10*3/uL (ref 150–450)
RBC: 5.05 x10E6/uL (ref 4.14–5.80)
RDW: 14.4 % (ref 11.6–15.4)
WBC: 6.4 10*3/uL (ref 3.4–10.8)

## 2022-04-11 LAB — LIPID PANEL
Chol/HDL Ratio: 4.1 ratio (ref 0.0–5.0)
Cholesterol, Total: 174 mg/dL (ref 100–199)
HDL: 42 mg/dL (ref >39)
LDL Chol Calc (NIH): 114 mg/dL — ABNORMAL HIGH (ref 0–99)
Triglycerides: 97 mg/dL (ref 0–149)
VLDL Cholesterol Cal: 18 mg/dL (ref 5–40)

## 2022-04-11 LAB — HEMOGLOBIN A1C
Est. average glucose Bld gHb Est-mCnc: 114 mg/dL
Hgb A1c MFr Bld: 5.6 % (ref 4.8–5.6)

## 2022-04-11 LAB — VITAMIN D 25 HYDROXY (VIT D DEFICIENCY, FRACTURES): Vit D, 25-Hydroxy: 21.1 ng/mL — ABNORMAL LOW (ref 30.0–100.0)

## 2022-04-11 LAB — RPR: RPR Ser Ql: NONREACTIVE

## 2022-04-11 LAB — TSH: TSH: 2.71 u[IU]/mL (ref 0.450–4.500)

## 2022-04-11 NOTE — Progress Notes (Signed)
Some labs are pending we will update you with the results when released

## 2022-04-11 NOTE — Progress Notes (Signed)
Eat a healthy diet, including lots of fruits and vegetables. Avoid foods with a lot of saturated and trans fats, such as red meat, butter, fried foods and cheese . Attain  a healthy weight.  Vitamin D level is low please take vitamin D 1000 units daily, get early morning sunshine . Foods rich in vitamin  D include Cod liver oil,Salmon,Swordfish,Tuna fish,Orange juice fortified with vitamin D,Dairy and plant milks fortified with vitamin D,Sardines,Beef liver    Other labs are normal

## 2022-04-12 LAB — HEPB+HEPC+HIV PANEL
HIV Screen 4th Generation wRfx: NONREACTIVE
Hep B C IgM: NEGATIVE
Hep B Core Total Ab: NEGATIVE
Hep B E Ab: NEGATIVE
Hep B E Ag: NEGATIVE
Hep B Surface Ab, Qual: NONREACTIVE
Hep C Virus Ab: NONREACTIVE
Hepatitis B Surface Ag: NEGATIVE

## 2022-04-14 LAB — CHLAMYDIA/GC NAA, CONFIRMATION
Chlamydia trachomatis, NAA: NEGATIVE
Neisseria gonorrhoeae, NAA: NEGATIVE

## 2022-04-16 NOTE — Progress Notes (Signed)
Normal test

## 2022-05-01 ENCOUNTER — Encounter: Payer: Self-pay | Admitting: Infectious Diseases

## 2022-05-01 ENCOUNTER — Other Ambulatory Visit (HOSPITAL_COMMUNITY): Payer: Self-pay

## 2022-05-01 ENCOUNTER — Telehealth: Payer: Self-pay

## 2022-05-01 NOTE — Progress Notes (Incomplete)
[facility], [place], [place], [address]                                                                  Phn. [phone]; [phone]                                                                             Date:   Reason for Visit: HIV follow up Primary Care provider:   HPI: Walter Howell is a 37 y.o.old male with a history of HIV.   Social: Occupation  Teacher, adult education drugs Sexual activity  ROS: Denies fevers, chills. Denies nausea, vomiting, abdominal pain, diarrhea/constipation, loss of appetite and weight loss, chills. Denies SOB, cough and chest pain. Denies GU complaints. Denies recent ED visits and recent hospitalizations. Denies rashes, joint complaints, headaches/neurological complaints    HIV diagnosed: CD4 at diagnosis: VL at diagnosis:  Recent CD4:  Recent viral load: Prior ART:  Current ART:  Hx of OI:  Hx of STI:  Risk factors:   Genotype Hx:                                         MEDS  Allergies  Allergen Reactions  . Prednisone     "makes him feel weird"   Past Medical History:  Diagnosis Date  . Anxiety   . Asthma   . Coronavirus infection 01/2020  . COVID   . Dizziness   . GERD (gastroesophageal reflux disease)   . Hypertension   . Seasonal allergies   . Vertigo   . Vitamin D deficiency    No past surgical history on file.  Social History   Socioeconomic History  . Marital status: Significant Other    Spouse name: Not on file  . Number of children: Not on file  . Years of education: Not on file  . Highest education level: Not on file  Occupational History  . Not on file  Tobacco Use  . Smoking status: Former  . Smokeless tobacco: Never  Vaping Use  . Vaping Use: Never used   Substance and Sexual Activity  . Alcohol use: Yes    Comment: Occasionally  . Drug use: Yes    Types: Marijuana  . Sexual activity: Yes    Comment: has male partner would like PREP.  Other Topics Concern  . Not on file  Social History Narrative   Lives with his partner    Social Determinants of Health   Financial Resource Strain: Not on file  Food Insecurity: Not on file  Transportation Needs: Not on file  Physical Activity: Not on file  Stress: Not on file  Social Connections: Not on file  Intimate Partner Violence: Not on file   Family History  Problem Relation Age of Onset  . Diabetes Maternal Grandmother   . Hypertension Maternal Grandmother   . Diabetes Maternal Grandfather   . Diabetes Paternal Grandmother   . Heart disease Paternal Grandmother   . Diabetes Paternal Grandfather   . Stroke Neg Hx   . Kidney disease Neg Hx      There were no vitals taken for this visit.   Examination  Gen: Alert and oriented x 3, no acute distress HEENT: [place]/AT, no scleral icterus, no pale conjunctivae, hearing normal, oral mucosa moist Neck: Supple Cardio: Regular rate and rhythm; +S1 and S2 Resp: CTAB GI: nondistended GU: Musc: Extremities: No cyanosis, clubbing, or edema Skin: No rashes, lesions, or ecchymoses Neuro: grossly non focal  Psych: Calm, cooperative    Lab Results No results found for: "HIV1RNAQUANT", "HIV1RNAVL", "CD4TABS" No results found for: "HIV1GENOSEQ" Lab Results  Component Value Date   WBC 6.4 04/10/2022   HGB 15.8 04/10/2022   HCT 46.1 04/10/2022   MCV 91 04/10/2022   PLT 219 04/10/2022    Lab Results  Component Value Date   CREATININE 0.84 01/09/2022   BUN 12 01/09/2022   NA 135 01/09/2022   K 3.9 01/10/2022   CL 100 01/09/2022   CO2 24 01/09/2022   Lab Results  Component Value Date   ALT 32 01/09/2022   AST 24 01/09/2022   ALKPHOS 47 01/09/2022   BILITOT 0.6 01/09/2022    Lab Results  Component Value Date   CHOL 174  04/10/2022   TRIG 97 04/10/2022   HDL 42 04/10/2022   LDLCALC 114 (H) 04/10/2022   No results found for: "HAV" Lab Results  Component Value Date   HEPBSAG Negative 04/10/2022   No results found for: "HCVAB" Lab Results  Component Value Date   CHLAMYDIAWP Negative 10/13/2021   N Negative 10/13/2021   No results found for: "GCPROBEAPT" No results found for: "QUANTGOLD"    Health Maintenance: Immunization History  Administered Date(s) Administered  . PFIZER(Purple Top)SARS-COV-2 Vaccination 05/24/2019, 08/10/2019       Assessment/Plan: HIV Discussed with patient treatment options and side effects, benefits of treatment, long term outcomes.  Discussed the severity of untreated HIV including higher cancer risk, opportunistic infections, renal failure.  Discussed needing to use condoms, partner disclosure, necessary vaccines, blood monitoring.       Smoking/Alcohol/Illicit substance use    STD Screening    #Immunization  COVID Influenza Monkeypox Pneumococcal Meningitis HepA/Hep B HPV Tdap Zoster  #Health maintenance # Health Maintenance: Hep C status Syphilis  TB testing  GC/Chlamydia Vitamin D  LDL OI ppx if indicated None  HLA [id number] Neg  Papsmear  Anal Pap if indicated Dental Care  Mammogram Colonoscopy  Patient's labs were reviewed as well as his previous records. Patients questions were addressed and answered. Safe sex counseling done.   I have personally spent 60 minutes involved in face-to-face and non-face-to-face activities for this patient on the day of the visit. Professional time spent includes the following activities: Preparing to see the patient (review of tests), Obtaining and/or reviewing separately obtained history (admission/discharge record), Performing a medically appropriate examination and/or evaluation , Ordering medications/tests/procedures, referring and communicating with other health care professionals, Documenting  clinical information in the EMR, Independently interpreting results (not  separately reported), Communicating results to the patient/family/caregiver, Counseling and educating the patient/family/caregiver and Care coordination (not separately reported).    Electronically signed by:  Rosiland Oz, MD Infectious Disease Physician [facility] [address] [place], [place] [address] Phone: [phone]  Fax: [phone]

## 2022-05-01 NOTE — Telephone Encounter (Signed)
[facility] Patient Teacher, English as a foreign language completed.    The patient is insured through [employer] [place].  Patient is not eligible due to non payment of premium . Patient will need to contact plan.  We will continue to follow to see if copay assistance is needed.  Ileene Patrick, [facility] Patient [facility] Phone: [phone] Fax:  [phone]

## 2022-05-08 ENCOUNTER — Other Ambulatory Visit: Payer: Self-pay

## 2022-05-08 ENCOUNTER — Ambulatory Visit: Payer: Commercial Managed Care - HMO | Admitting: Infectious Diseases

## 2022-05-08 ENCOUNTER — Other Ambulatory Visit (HOSPITAL_COMMUNITY): Payer: Self-pay

## 2022-05-08 ENCOUNTER — Encounter: Payer: Self-pay | Admitting: Infectious Diseases

## 2022-05-08 VITALS — BP 165/97 | HR 60 | Resp 16 | Ht 71.0 in | Wt 241.0 lb

## 2022-05-08 DIAGNOSIS — Z7252 High risk homosexual behavior: Secondary | ICD-10-CM

## 2022-05-08 MED ORDER — EMTRICITABINE-TENOFOVIR DF 200-300 MG PO TABS: 1.0000 | ORAL_TABLET | Freq: Every day | ORAL | 11 refills | Status: DC

## 2022-05-08 MED ORDER — EMTRICITABINE-TENOFOVIR DF 200-300 MG PO TABS: 1.0000 | ORAL_TABLET | Freq: Every day | ORAL | 0 refills | Status: DC

## 2022-05-08 NOTE — Assessment & Plan Note (Addendum)
Walter Howell is here for new consultation to start PrEP. He will be a good candidate for any treatment mode but prefers injectable option. He is high risk for acquiring HIV through unprotected sex given verse MSM. Counseled on correct administration of Truvada including expected side effects to expect. Recommend ongoing condom use to prevent other bacterial STIs.   Needs to pay up his insurance premium before any option will be covered. Truvada with Goodrx coupon through [facility] will be $111 from what I can see online. He will elect to do this and start preventative treatment today and message [place] back when he has paid up premiums.   FU in 61mwith pharmacy team for consideration of switch to apretude.   Will hold on further labs until insurance payments are verified. Needs hep b Ab, hep a Ab, hep c Ab screenings with vaccinations as indicated along with extragenital bacterial STI screenings for GC/CT.

## 2022-05-08 NOTE — Progress Notes (Signed)
Subjective   Insured   '[x]'$    Uninsured  '[]'$    Demographics Name: Walter Howell DOB: 03-14-1985  MRN: [id number]  Race:  Black or African American [2] Marital Status:  Significant Other   HPI  Walter Howell is a 37 y.o. male here for new consultation for PrEP.   He saw his PCP recently and talked about starting treatment for HIV prevention when STI screenings were requested. Walter Howell reports that he prefers male partners (verse contact) and condoms are not always consistently used.  No h/o bacterial STI in the past.  On blood pressure medication - forgets pills occasionally.  Last HIV screen was within the last 4 weeks. And non-reactive. RPR and HBsAg was also non reactive at that time.   Would like to be considered for apretude injections for PrEP over daily pills.    Review of Systems  Constitutional:  Negative for chills and fever.  HENT:  Negative for sore throat.   Eyes:  Negative for visual disturbance.  Gastrointestinal:  Negative for abdominal pain, anal bleeding and rectal pain.  Genitourinary:  Negative for dysuria, genital sores, penile discharge, penile pain, scrotal swelling and testicular pain.  Musculoskeletal:  Negative for arthralgias and joint swelling.  Skin:  Negative for rash.  Neurological:  Negative for headaches.  Hematological:  Negative for adenopathy.     Allergies  Allergen Reactions   Prednisone     "makes him feel weird"    Past Medical History:  Diagnosis Date   Anxiety    Asthma    Coronavirus infection 01/2020   COVID    Dizziness    GERD (gastroesophageal reflux disease)    Hypertension    Seasonal allergies    Vertigo    Vitamin D deficiency     Social History   Substance and Sexual Activity  Sexual Activity Yes   Comment: has male partner would like PREP.    Social History   Tobacco Use  Smoking Status Former   Passive exposure: Never  Smokeless Tobacco Never   Social History   Substance and  Sexual Activity  Alcohol Use Yes   Comment: Occasionally    Drug use?   Yes '[x]'$  No '[]'$  Marijuana  Injectable drug use?   Yes '[]'$     No '[x]'$   Missing doses? Yes '[]'$    No  '[]'$   Outpatient Encounter Medications as of 05/08/2022  Medication Sig   albuterol (VENTOLIN HFA) 108 (90 Base) MCG/ACT inhaler TAKE 2 PUFFS BY MOUTH EVERY 6 HOURS AS NEEDED FOR WHEEZE OR SHORTNESS OF BREATH   cetirizine (ZYRTEC) 10 MG tablet Take 1 tablet (10 mg total) by mouth daily.   cyclobenzaprine (FLEXERIL) 5 MG tablet Take 1 tablet (5 mg total) by mouth 3 (three) times daily as needed for muscle spasms.   hydrOXYzine (ATARAX/VISTARIL) 25 MG tablet Take 25 mg by mouth 3 (three) times daily.   ibuprofen (ADVIL) 800 MG tablet Take 1 tablet (800 mg total) by mouth 3 (three) times daily.   Multiple Vitamin (MULTIVITAMIN WITH MINERALS) TABS tablet Take 1 tablet by mouth daily.   omeprazole (PRILOSEC) 20 MG capsule Take 1 capsule (20 mg total) by mouth daily.   ondansetron (ZOFRAN) 4 MG tablet Take 1 tablet (4 mg total) by mouth every 8 (eight) hours as needed for nausea or vomiting.   [DISCONTINUED] emtricitabine-tenofovir (TRUVADA) 200-300 MG tablet Take 1 tablet by mouth daily.   acetaminophen (TYLENOL) 500 MG tablet Take 1,000 mg by mouth  every 6 (six) hours as needed for mild pain, moderate pain, fever or headache. (Patient not taking: Reported on 04/10/2022)   Budesonide 90 MCG/ACT inhaler Inhale 1 puff into the lungs 2 (two) times daily. (Patient not taking: Reported on 01/08/2022)   busPIRone (BUSPAR) 15 MG tablet Take 1 tablet (15 mg total) by mouth 2 (two) times daily. (Patient not taking: Reported on 05/15/2021)   emtricitabine-tenofovir (TRUVADA) 200-300 MG tablet Take 1 tablet by mouth daily.   gabapentin (NEURONTIN) 300 MG capsule Take 1 capsule (300 mg total) by mouth at bedtime. (Patient not taking: Reported on 01/08/2022)   lidocaine (LIDODERM) 5 % Place 1 patch onto the skin daily. Remove & Discard patch within  12 hours or as directed by MD   Lidocaine HCl-Benzyl Alcohol (SALONPAS LIDOCAINE PLUS) 4-10 % CREA Apply 1 application. topically every 8 (eight) hours. (Patient not taking: Reported on 01/08/2022)   losartan-hydrochlorothiazide (HYZAAR) 100-25 MG tablet Take 1 tablet by mouth daily.   meclizine (ANTIVERT) 25 MG tablet Take 1 tablet (25 mg total) by mouth 3 (three) times daily as needed for dizziness. (Patient not taking: Reported on 01/10/2022)   No facility-administered encounter medications on file as of 05/08/2022.        No data to display             Objective   Vitals:   05/08/22 1504 05/08/22 1509  BP: (!) 158/92 (!) 165/97  Pulse: 60 60  Resp: 16     Body mass index is 33.61 kg/m.  Physical Exam Vitals reviewed.  Constitutional:      Appearance: Normal appearance. He is not ill-appearing.  HENT:     Head: Normocephalic.     Mouth/Throat:     Mouth: Mucous membranes are moist.     Pharynx: Oropharynx is clear.  Eyes:     General: No scleral icterus. Cardiovascular:     Rate and Rhythm: Normal rate.  Pulmonary:     Effort: Pulmonary effort is normal.  Musculoskeletal:        General: Normal range of motion.     Cervical back: Normal range of motion.  Skin:    Coloration: Skin is not jaundiced or pale.  Neurological:     Mental Status: He is alert and oriented to person, place, and time.  Psychiatric:        Mood and Affect: Mood normal.        Judgment: Judgment normal.       Labs: Creatinine Lab Results  Component Value Date   CREATININE 0.84 01/09/2022   CREATININE 1.02 05/15/2021   CREATININE 0.87 12/30/2020    HIV Lab Results  Component Value Date   HIV Non Reactive 04/10/2022   HIV Non Reactive 10/13/2021   HIV Non Reactive 05/15/2021    GFR CrCl cannot be calculated (Patient's most recent lab result is older than the maximum 21 days allowed.).  Hepatitis B No results found for: "HEPBSAB" Lab Results  Component Value Date    HEPBSAG Negative 04/10/2022    Hepatitis C No results found for: "HCVAB"  Hepatitis A No results found for: "HAV"  RPR and STI Lab Results  Component Value Date   LABRPR Non Reactive 04/10/2022   LABRPR Non Reactive 01/09/2022   LABRPR NON REACTIVE 10/13/2021    STI Results GC CT  10/13/2021  9:41 AM Negative  Negative   09/07/2019  4:32 PM Negative  Negative   07/29/2019  9:01 AM Negative  Negative  05/21/2019 11:08 AM Negative  Negative   02/12/2019  1:57 PM Negative  Negative   11/20/2018 12:00 AM Negative  Negative   05/12/2018 12:00 AM Negative  Negative   01/02/2018 12:00 AM Negative  Negative   03/28/2017 12:00 AM Negative  Negative   09/18/2016 12:00 AM Negative  Negative   12/06/2015 12:00 AM Negative  Negative   02/24/2015 12:00 AM Negative  Negative      Assessment & Plan:    Patient Active Problem List   Diagnosis Date Noted   Annual physical exam 04/10/2022   High risk homosexual behavior 04/10/2022   Marijuana user 04/10/2022   Blurry vision, bilateral 04/10/2022   Hyperkalemia 01/10/2022   Chronic right shoulder pain 01/08/2022   High risk bisexual behavior 01/08/2022   Mild intermittent asthma without complication [id number]   Obesity with serious comorbidity 09/04/2019   Benign paroxysmal positional vertigo 07/23/2017   TMJ (temporomandibular joint disorder) 07/23/2017   Gastroesophageal reflux disease without esophagitis 07/23/2017   Chest pain 01/23/2017   History of herniated intervertebral disc 12/25/2016   Palpitations 12/25/2016   Anxiety and depression 12/06/2015   Chronic bilateral low back pain without sciatica 12/06/2015   Essential hypertension 07/08/2013    Problem List Items Addressed This Visit       Unprioritized   High risk homosexual behavior - Primary    Walter Howell is here for new consultation to start PrEP. He will be a good candidate for any treatment mode but prefers injectable option. He is high risk for acquiring  HIV through unprotected sex given verse MSM. Counseled on correct administration of Truvada including expected side effects to expect. Recommend ongoing condom use to prevent other bacterial STIs.   Needs to pay up his insurance premium before any option will be covered. Truvada with Goodrx coupon through [facility] will be $111 from what I can see online. He will elect to do this and start preventative treatment today and message [place] back when he has paid up premiums.   FU in 43mwith pharmacy team for consideration of switch to apretude.   Will hold on further labs until insurance payments are verified. Needs hep b Ab, hep a Ab, hep c Ab screenings with vaccinations as indicated along with extragenital bacterial STI screenings for GC/CT.       Relevant Medications   emtricitabine-tenofovir (TRUVADA) 200-300 MG tablet    SJanene Madeira MSN, NP-C [facility] [email or web address] Pager: [phone]: [phone] [phone]

## 2022-05-08 NOTE — Patient Instructions (Signed)
   Lead in side effects you may experience: headaches and nausea   Take with food and use tylenol first to help the headaches.   When you pay up your insurance premium, send me an update on your mychart so we can investigate what your pharmacy will cover for you.   Will plan to see you back with pharmacy team in 1 month

## 2022-06-08 ENCOUNTER — Other Ambulatory Visit: Payer: Self-pay | Admitting: Infectious Diseases

## 2022-06-08 DIAGNOSIS — Z7252 High risk homosexual behavior: Secondary | ICD-10-CM

## 2022-06-08 NOTE — Telephone Encounter (Signed)
Okay to refill? I spoke to patient - transferred to front desk to schedule an appointment with pharmacy.

## 2022-06-08 NOTE — Telephone Encounter (Signed)
One refill provided until FU with [facility] (scheduled currently with Pharmacy PrEP team on 07/10/2022)

## 2022-07-07 ENCOUNTER — Emergency Department (HOSPITAL_COMMUNITY): Payer: Self-pay

## 2022-07-07 ENCOUNTER — Encounter (HOSPITAL_COMMUNITY): Payer: Self-pay

## 2022-07-07 ENCOUNTER — Other Ambulatory Visit: Payer: Self-pay

## 2022-07-07 ENCOUNTER — Emergency Department (HOSPITAL_COMMUNITY)
Admission: EM | Admit: 2022-07-07 | Discharge: 2022-07-08 | Disposition: A | Discharge status: Home / Self Care | Payer: Self-pay | Attending: Emergency Medicine | Admitting: Emergency Medicine

## 2022-07-07 DIAGNOSIS — Z79899 Other long term (current) drug therapy: Secondary | ICD-10-CM | POA: Insufficient documentation

## 2022-07-07 DIAGNOSIS — K529 Noninfective gastroenteritis and colitis, unspecified: Secondary | ICD-10-CM | POA: Insufficient documentation

## 2022-07-07 DIAGNOSIS — E876 Hypokalemia: Secondary | ICD-10-CM | POA: Insufficient documentation

## 2022-07-07 DIAGNOSIS — I10 Essential (primary) hypertension: Secondary | ICD-10-CM | POA: Insufficient documentation

## 2022-07-07 DIAGNOSIS — Z8616 Personal history of COVID-19: Secondary | ICD-10-CM | POA: Insufficient documentation

## 2022-07-07 DIAGNOSIS — Z7951 Long term (current) use of inhaled steroids: Secondary | ICD-10-CM | POA: Insufficient documentation

## 2022-07-07 DIAGNOSIS — J45909 Unspecified asthma, uncomplicated: Secondary | ICD-10-CM | POA: Insufficient documentation

## 2022-07-07 DIAGNOSIS — Z20822 Contact with and (suspected) exposure to covid-19: Secondary | ICD-10-CM | POA: Insufficient documentation

## 2022-07-07 LAB — CBC WITH DIFFERENTIAL/PLATELET
Abs Immature Granulocytes: 0.02 10*3/uL (ref 0.00–0.07)
Basophils Absolute: 0 10*3/uL (ref 0.0–0.1)
Basophils Relative: 0 %
Eosinophils Absolute: 0 10*3/uL (ref 0.0–0.5)
Eosinophils Relative: 0 %
HCT: 43.7 % (ref 39.0–52.0)
Hemoglobin: 15.4 g/dL (ref 13.0–17.0)
Immature Granulocytes: 0 %
Lymphocytes Relative: 10 %
Lymphs Abs: 1 10*3/uL (ref 0.7–4.0)
MCH: 32 pg (ref 26.0–34.0)
MCHC: 35.2 g/dL (ref 30.0–36.0)
MCV: 90.9 fL (ref 80.0–100.0)
Monocytes Absolute: 0.6 10*3/uL (ref 0.1–1.0)
Monocytes Relative: 6 %
Neutro Abs: 8.4 10*3/uL — ABNORMAL HIGH (ref 1.7–7.7)
Neutrophils Relative %: 84 %
Platelets: 179 10*3/uL (ref 150–400)
RBC: 4.81 MIL/uL (ref 4.22–5.81)
RDW: 13.3 % (ref 11.5–15.5)
WBC: 10 10*3/uL (ref 4.0–10.5)
nRBC: 0 % (ref 0.0–0.2)

## 2022-07-07 LAB — URINALYSIS, ROUTINE W REFLEX MICROSCOPIC
Bacteria, UA: NONE SEEN
Bilirubin Urine: NEGATIVE
Glucose, UA: NEGATIVE mg/dL
Ketones, ur: NEGATIVE mg/dL
Leukocytes,Ua: NEGATIVE
Nitrite: NEGATIVE
Protein, ur: NEGATIVE mg/dL
Specific Gravity, Urine: 1 — ABNORMAL LOW (ref 1.005–1.030)
pH: 6 (ref 5.0–8.0)

## 2022-07-07 LAB — COMPREHENSIVE METABOLIC PANEL
ALT: 20 U/L (ref 0–44)
AST: 19 U/L (ref 15–41)
Albumin: 3.7 g/dL (ref 3.5–5.0)
Alkaline Phosphatase: 34 U/L — ABNORMAL LOW (ref 38–126)
Anion gap: 10 (ref 5–15)
BUN: 7 mg/dL (ref 6–20)
CO2: 22 mmol/L (ref 22–32)
Calcium: 8.3 mg/dL — ABNORMAL LOW (ref 8.9–10.3)
Chloride: 101 mmol/L (ref 98–111)
Creatinine, Ser: 0.94 mg/dL (ref 0.61–1.24)
GFR, Estimated: >60 mL/min (ref >60)
Glucose, Bld: 109 mg/dL — ABNORMAL HIGH (ref 70–99)
Potassium: 2.7 mmol/L — CL (ref 3.5–5.1)
Sodium: 133 mmol/L — ABNORMAL LOW (ref 135–145)
Total Bilirubin: 0.7 mg/dL (ref 0.3–1.2)
Total Protein: 6.8 g/dL (ref 6.5–8.1)

## 2022-07-07 LAB — POC OCCULT BLOOD, ED: Fecal Occult Bld: NEGATIVE

## 2022-07-07 LAB — LIPASE, BLOOD: Lipase: 31 U/L (ref 11–51)

## 2022-07-07 LAB — SARS CORONAVIRUS 2 BY RT PCR: SARS Coronavirus 2 by RT PCR: NEGATIVE

## 2022-07-07 MED ORDER — POTASSIUM CHLORIDE CRYS ER 20 MEQ PO TBCR
40.0000 meq | EXTENDED_RELEASE_TABLET | Freq: Once | ORAL | Status: AC
  Administered 2022-07-07: 40 meq via ORAL
  Filled 2022-07-07: qty 2

## 2022-07-07 MED ORDER — POTASSIUM CHLORIDE 10 MEQ/100ML IV SOLN
10.0000 meq | INTRAVENOUS | Status: AC
  Administered 2022-07-07 (×2): 10 meq via INTRAVENOUS
  Filled 2022-07-07 (×2): qty 100

## 2022-07-07 MED ORDER — ONDANSETRON 4 MG PO TBDP
4.0000 mg | ORAL_TABLET | Freq: Once | ORAL | Status: AC
  Administered 2022-07-07: 4 mg via ORAL
  Filled 2022-07-07: qty 1

## 2022-07-07 MED ORDER — IBUPROFEN 800 MG PO TABS
800.0000 mg | ORAL_TABLET | Freq: Once | ORAL | Status: AC
  Administered 2022-07-07: 800 mg via ORAL
  Filled 2022-07-07: qty 1

## 2022-07-07 MED ORDER — KETOROLAC TROMETHAMINE 30 MG/ML IJ SOLN
30.0000 mg | Freq: Once | INTRAMUSCULAR | Status: AC
  Administered 2022-07-08: 30 mg via INTRAVENOUS
  Filled 2022-07-07: qty 1

## 2022-07-07 MED ORDER — ACETAMINOPHEN 500 MG PO TABS
1000.0000 mg | ORAL_TABLET | Freq: Once | ORAL | Status: AC
  Administered 2022-07-07: 1000 mg via ORAL
  Filled 2022-07-07: qty 2

## 2022-07-07 MED ORDER — ONDANSETRON 4 MG PO TBDP: 4.0000 mg | ORAL_TABLET | Freq: Three times a day (TID) | ORAL | 0 refills | Status: DC | PRN

## 2022-07-07 MED ORDER — MORPHINE SULFATE (PF) 4 MG/ML IV SOLN
4.0000 mg | Freq: Once | INTRAVENOUS | Status: DC
  Filled 2022-07-07: qty 1

## 2022-07-07 MED ORDER — CIPROFLOXACIN HCL 500 MG PO TABS: 500.0000 mg | ORAL_TABLET | Freq: Two times a day (BID) | ORAL | 0 refills | Status: DC

## 2022-07-07 MED ORDER — IOHEXOL 300 MG/ML  SOLN
100.0000 mL | Freq: Once | INTRAMUSCULAR | Status: AC | PRN
  Administered 2022-07-07: 100 mL via INTRAVENOUS

## 2022-07-07 NOTE — Discharge Instructions (Addendum)
Please take your medications as prescribed. Take tylenol/ibuprofen for pain. I recommend close follow-up with PCP for reevaluation of your symptoms and potassium level.  Please do not hesitate to return to emergency department if worrisome signs symptoms we discussed become apparent.

## 2022-07-07 NOTE — ED Provider Notes (Signed)
[place] EMERGENCY DEPARTMENT AT [facility] Provider Note   CSN: [id number] Arrival date & time: 07/07/22  1901     History {Add pertinent medical, surgical, social history, OB history to HPI:1} Chief Complaint  Patient presents with  . Chills  . Headache  . Abdominal Pain    Walter Howell is a 37 y.o. male with a past medical history of anxiety, asthma, hypertension presents today for evaluation of abdominal pain.  States he has had generalized abdominal pain since yesterday.  Endorses nausea and diarrhea.  He also sees some blood on the wipes earlier today.  Denies any hemorrhoids.   Headache Associated symptoms: abdominal pain   Abdominal Pain     Past Medical History:  Diagnosis Date  . Anxiety   . Asthma   . Coronavirus infection 01/2020  . COVID   . Dizziness   . GERD (gastroesophageal reflux disease)   . Hypertension   . Seasonal allergies   . Vertigo   . Vitamin D deficiency    History reviewed. No pertinent surgical history.   Home Medications Prior to Admission medications   Medication Sig Start Date End Date Taking? Authorizing Provider  acetaminophen (TYLENOL) 500 MG tablet Take 1,000 mg by mouth every 6 (six) hours as needed for mild pain, moderate pain, fever or headache. Patient not taking: Reported on 04/10/2022    [provider]  albuterol (VENTOLIN HFA) 108 (90 Base) MCG/ACT inhaler TAKE 2 PUFFS BY MOUTH EVERY 6 HOURS AS NEEDED FOR WHEEZE OR SHORTNESS OF BREATH 04/10/22   Paseda, Baird Kay, FNP  Budesonide 90 MCG/ACT inhaler Inhale 1 puff into the lungs 2 (two) times daily. Patient not taking: Reported on 01/08/2022 07/27/20   Valinda Hoar, NP  busPIRone (BUSPAR) 15 MG tablet Take 1 tablet (15 mg total) by mouth 2 (two) times daily. Patient not taking: Reported on 05/15/2021 02/12/19   Anders Simmonds, PA-C  cetirizine (ZYRTEC) 10 MG tablet Take 1 tablet (10 mg total) by mouth daily. 09/13/20   Barbette Merino, NP   cyclobenzaprine (FLEXERIL) 5 MG tablet Take 1 tablet (5 mg total) by mouth 3 (three) times daily as needed for muscle spasms. 01/08/22   Donell Beers, FNP  emtricitabine-tenofovir (TRUVADA) 200-300 MG tablet TAKE ONE TABLET BY MOUTH ONE TIME DAILY 06/08/22   Blanchard Kelch, NP  gabapentin (NEURONTIN) 300 MG capsule Take 1 capsule (300 mg total) by mouth at bedtime. Patient not taking: Reported on 01/08/2022 05/15/21 07/14/21  Barbette Merino, NP  hydrOXYzine (ATARAX/VISTARIL) 25 MG tablet Take 25 mg by mouth 3 (three) times daily. 05/19/20   [provider]  ibuprofen (ADVIL) 800 MG tablet Take 1 tablet (800 mg total) by mouth 3 (three) times daily. 02/05/22   Paseda, Baird Kay, FNP  lidocaine (LIDODERM) 5 % Place 1 patch onto the skin daily. Remove & Discard patch within 12 hours or as directed by MD 05/15/21   Barbette Merino, NP  Lidocaine HCl-Benzyl Alcohol (SALONPAS LIDOCAINE PLUS) 4-10 % CREA Apply 1 application. topically every 8 (eight) hours. Patient not taking: Reported on 01/08/2022 05/15/21   Barbette Merino, NP  losartan-hydrochlorothiazide (HYZAAR) 100-25 MG tablet Take 1 tablet by mouth daily. 01/08/22   Donell Beers, FNP  meclizine (ANTIVERT) 25 MG tablet Take 1 tablet (25 mg total) by mouth 3 (three) times daily as needed for dizziness. Patient not taking: Reported on 01/10/2022 11/23/19   Kallie Locks, FNP  Multiple Vitamin (  MULTIVITAMIN WITH MINERALS) TABS tablet Take 1 tablet by mouth daily.    [provider]  omeprazole (PRILOSEC) 20 MG capsule Take 1 capsule (20 mg total) by mouth daily. 01/08/22   Paseda, Baird Kay, FNP  ondansetron (ZOFRAN) 4 MG tablet Take 1 tablet (4 mg total) by mouth every 8 (eight) hours as needed for nausea or vomiting. 10/13/21   Ellsworth Lennox, PA-C      Allergies    Prednisone    Review of Systems   Review of Systems  Gastrointestinal:  Positive for abdominal pain.  Neurological:  Positive for headaches.     Physical Exam Updated Vital Signs BP (!) 195/114 (BP Location: Right Arm)   Pulse 86   Temp 100.2 F (37.9 C) (Oral) Comment: 1g tylenol @13 :00  Resp 20   Ht 5\' 11"  (1.803 m)   Wt 106 kg   SpO2 94%   BMI 32.59 kg/m  Physical Exam  ED Results / Procedures / Treatments   Labs (all labs ordered are listed, but only abnormal results are displayed) Labs Reviewed  SARS CORONAVIRUS 2 BY RT PCR    EKG None  Radiology No results found.  Procedures Procedures  {Document cardiac monitor, telemetry assessment procedure when appropriate:1}  Medications Ordered in ED Medications  acetaminophen (TYLENOL) tablet 1,000 mg (has no administration in time range)  ibuprofen (ADVIL) tablet 800 mg (has no administration in time range)  ondansetron (ZOFRAN-ODT) disintegrating tablet 4 mg (has no administration in time range)    ED Course/ Medical Decision Making/ A&P   {   Click here for ABCD2, HEART and other calculatorsREFRESH Note before signing :1}                          Medical Decision Making Risk OTC drugs. Prescription drug management.   ***  {Document critical care time when appropriate:1} {Document review of labs and clinical decision tools ie heart score, Chads2Vasc2 etc:1}  {Document your independent review of radiology images, and any outside records:1} {Document your discussion with family members, caretakers, and with consultants:1} {Document social determinants of health affecting pt's care:1} {Document your decision making why or why not admission, treatments were needed:1} Final Clinical Impression(s) / ED Diagnoses Final diagnoses:  None    Rx / DC Orders ED Discharge Orders     None

## 2022-07-07 NOTE — ED Triage Notes (Signed)
C/o generalized abd pain with nausea, diarrhea, chills, body ache, and headache x1 day Pt reports diarrhea had what appeared to be bight red blood in it. Denies blood thinner usage Denies hemorrhoids.

## 2022-07-09 ENCOUNTER — Other Ambulatory Visit (HOSPITAL_COMMUNITY): Payer: Self-pay

## 2022-07-09 NOTE — Progress Notes (Unsigned)
Date:  07/09/2022   HPI: Walter Howell is a 37 y.o. male who presents to the [facility] pharmacy clinic for HIV PrEP follow-up and to discuss initiation of Apretude.  Insured   []    Uninsured  [x]    Patient Active Problem List   Diagnosis Date Noted   Annual physical exam 04/10/2022   High risk homosexual behavior 04/10/2022   Marijuana user 04/10/2022   Blurry vision, bilateral 04/10/2022   Hyperkalemia 01/10/2022   Chronic right shoulder pain 01/08/2022   High risk bisexual behavior 01/08/2022   Mild intermittent asthma without complication 09/04/2019   Obesity with serious comorbidity 09/04/2019   Benign paroxysmal positional vertigo 07/23/2017   TMJ (temporomandibular joint disorder) 07/23/2017   Gastroesophageal reflux disease without esophagitis 07/23/2017   Chest pain 01/23/2017   History of herniated intervertebral disc 12/25/2016   Palpitations 12/25/2016   Anxiety and depression 12/06/2015   Chronic bilateral low back pain without sciatica 12/06/2015   Essential hypertension 07/08/2013    Patient's Medications  New Prescriptions   No medications on file  Previous Medications   ACETAMINOPHEN (TYLENOL) 500 MG TABLET    Take 1,000 mg by mouth every 6 (six) hours as needed for mild pain, moderate pain, fever or headache.   ALBUTEROL (VENTOLIN HFA) 108 (90 BASE) MCG/ACT INHALER    TAKE 2 PUFFS BY MOUTH EVERY 6 HOURS AS NEEDED FOR WHEEZE OR SHORTNESS OF BREATH   BUDESONIDE 90 MCG/ACT INHALER    Inhale 1 puff into the lungs 2 (two) times daily.   BUSPIRONE (BUSPAR) 15 MG TABLET    Take 1 tablet (15 mg total) by mouth 2 (two) times daily.   CETIRIZINE (ZYRTEC) 10 MG TABLET    Take 1 tablet (10 mg total) by mouth daily.   CIPROFLOXACIN (CIPRO) 500 MG TABLET    Take 1 tablet (500 mg total) by mouth every 12 (twelve) hours.   CYCLOBENZAPRINE (FLEXERIL) 5 MG TABLET    Take 1 tablet (5 mg total) by mouth 3 (three) times daily as needed for muscle spasms.    EMTRICITABINE-TENOFOVIR (TRUVADA) 200-300 MG TABLET    TAKE ONE TABLET BY MOUTH ONE TIME DAILY   GABAPENTIN (NEURONTIN) 300 MG CAPSULE    Take 1 capsule (300 mg total) by mouth at bedtime.   HYDROXYZINE (ATARAX/VISTARIL) 25 MG TABLET    Take 25 mg by mouth 3 (three) times daily.   IBUPROFEN (ADVIL) 800 MG TABLET    Take 1 tablet (800 mg total) by mouth 3 (three) times daily.   LIDOCAINE (LIDODERM) 5 %    Place 1 patch onto the skin daily. Remove & Discard patch within 12 hours or as directed by MD   LIDOCAINE HCL-BENZYL ALCOHOL (SALONPAS LIDOCAINE PLUS) 4-10 % CREA    Apply 1 application. topically every 8 (eight) hours.   LOSARTAN-HYDROCHLOROTHIAZIDE (HYZAAR) 100-25 MG TABLET    Take 1 tablet by mouth daily.   MECLIZINE (ANTIVERT) 25 MG TABLET    Take 1 tablet (25 mg total) by mouth 3 (three) times daily as needed for dizziness.   MULTIPLE VITAMIN (MULTIVITAMIN WITH MINERALS) TABS TABLET    Take 1 tablet by mouth daily.   OMEPRAZOLE (PRILOSEC) 20 MG CAPSULE    Take 1 capsule (20 mg total) by mouth daily.   ONDANSETRON (ZOFRAN) 4 MG TABLET    Take 1 tablet (4 mg total) by mouth every 8 (eight) hours as needed for nausea or vomiting.   ONDANSETRON (ZOFRAN-ODT) 4 MG DISINTEGRATING TABLET  Take 1 tablet (4 mg total) by mouth every 8 (eight) hours as needed for nausea or vomiting.  Modified Medications   No medications on file  Discontinued Medications   No medications on file    Allergies: Allergies  Allergen Reactions   Prednisone     "makes him feel weird"    Past Medical History: Past Medical History:  Diagnosis Date   Anxiety    Asthma    Coronavirus infection 01/2020   COVID    Dizziness    GERD (gastroesophageal reflux disease)    Hypertension    Seasonal allergies    Vertigo    Vitamin D deficiency     Social History: Social History   Socioeconomic History   Marital status: Significant Other    Spouse name: Not on file   Number of children: Not on file   Years of  education: Not on file   Highest education level: Not on file  Occupational History   Not on file  Tobacco Use   Smoking status: Former    Passive exposure: Never   Smokeless tobacco: Never  Vaping Use   Vaping Use: Never used  Substance and Sexual Activity   Alcohol use: Yes    Comment: Occasionally   Drug use: Yes    Types: Marijuana   Sexual activity: Yes    Comment: has male partner would like PREP.  Other Topics Concern   Not on file  Social History Narrative   Lives with his partner    Social Determinants of Health   Financial Resource Strain: Not on file  Food Insecurity: Not on file  Transportation Needs: Not on file  Physical Activity: Not on file  Stress: Not on file  Social Connections: Not on file        No data to display          Labs:  SCr: Lab Results  Component Value Date   CREATININE 0.94 07/07/2022   CREATININE 0.84 01/09/2022   CREATININE 1.02 05/15/2021   CREATININE 0.87 12/30/2020   CREATININE 0.94 11/10/2020   HIV Lab Results  Component Value Date   HIV Non Reactive 04/10/2022   HIV Non Reactive 10/13/2021   HIV Non Reactive 05/15/2021   HIV Non Reactive 09/07/2019   HIV Non Reactive 11/20/2018   Hepatitis B Lab Results  Component Value Date   HEPBSAG Negative 04/10/2022   HEPBCAB Negative 04/10/2022   Hepatitis C No results found for: "HEPCAB", "HCVRNAPCRQN" Hepatitis A No results found for: "HAV" RPR and STI Lab Results  Component Value Date   LABRPR Non Reactive 04/10/2022   LABRPR Non Reactive 01/09/2022   LABRPR NON REACTIVE 10/13/2021   LABRPR Non Reactive 05/15/2021   LABRPR NON REACTIVE 11/20/2018    STI Results GC CT  10/13/2021  9:41 AM Negative  Negative   09/07/2019  4:32 PM Negative  Negative   07/29/2019  9:01 AM Negative  Negative   05/21/2019 11:08 AM Negative  Negative   02/12/2019  1:57 PM Negative  Negative   11/20/2018 12:00 AM Negative  Negative   05/12/2018 12:00 AM Negative  Negative    01/02/2018 12:00 AM Negative  Negative   03/28/2017 12:00 AM Negative  Negative   09/18/2016 12:00 AM Negative  Negative   12/06/2015 12:00 AM Negative  Negative   02/24/2015 12:00 AM Negative  Negative    Current PrEP Regimen: Truvada  Assessment: Naftuly presents today to discuss initiation of Apretude. Counseled that Apretude is  an intramuscular injections in the gluteal muscle for each visit. Explained that the second injection is 30 days after the initial injection then every 2 months thereafter.  Explained that showing up to injection appointments is very important and warned that if 2 appointments are missed, it will be reassessed by their provider whether they are a good candidate for injection therapy. Counseled on possible side effects associated with the injections such as injection site pain, which is usually mild to moderate in nature, injection site nodules, and injection site reactions.   *** new partners since last visit. Last STI screening was on 04/10/22 and was negative. No known exposures to any STIs since last visit. ***Agrees to full STI testing today with RPR and oral/urine/rectal cytologies.   Plan: ***  Irish Elders, PharmD PGY-1 [facility] Pharmacy Resident

## 2022-07-10 ENCOUNTER — Ambulatory Visit (INDEPENDENT_AMBULATORY_CARE_PROVIDER_SITE_OTHER): Payer: Self-pay | Admitting: Pharmacist

## 2022-07-10 ENCOUNTER — Other Ambulatory Visit: Payer: Self-pay

## 2022-07-10 ENCOUNTER — Other Ambulatory Visit (HOSPITAL_COMMUNITY): Payer: Self-pay

## 2022-07-10 DIAGNOSIS — Z2981 Encounter for HIV pre-exposure prophylaxis: Secondary | ICD-10-CM

## 2022-07-10 DIAGNOSIS — Z113 Encounter for screening for infections with a predominantly sexual mode of transmission: Secondary | ICD-10-CM

## 2022-07-10 DIAGNOSIS — Z23 Encounter for immunization: Secondary | ICD-10-CM

## 2022-07-10 NOTE — Progress Notes (Unsigned)
Date:  07/10/2022   HPI: Walter Howell is a 37 y.o. male who presents to the [facility] pharmacy clinic for HIV PrEP follow-up and to discuss initiation of Apretude.  Insured   []    Uninsured  [x]    Patient Active Problem List   Diagnosis Date Noted   Annual physical exam 04/10/2022   High risk homosexual behavior 04/10/2022   Marijuana user 04/10/2022   Blurry vision, bilateral 04/10/2022   Hyperkalemia 01/10/2022   Chronic right shoulder pain 01/08/2022   High risk bisexual behavior 01/08/2022   Mild intermittent asthma without complication 09/04/2019   Obesity with serious comorbidity 09/04/2019   Benign paroxysmal positional vertigo 07/23/2017   TMJ (temporomandibular joint disorder) 07/23/2017   Gastroesophageal reflux disease without esophagitis 07/23/2017   Chest pain 01/23/2017   History of herniated intervertebral disc 12/25/2016   Palpitations 12/25/2016   Anxiety and depression 12/06/2015   Chronic bilateral low back pain without sciatica 12/06/2015   Essential hypertension 07/08/2013    Patient's Medications  New Prescriptions   No medications on file  Previous Medications   ACETAMINOPHEN (TYLENOL) 500 MG TABLET    Take 1,000 mg by mouth every 6 (six) hours as needed for mild pain, moderate pain, fever or headache.   ALBUTEROL (VENTOLIN HFA) 108 (90 BASE) MCG/ACT INHALER    TAKE 2 PUFFS BY MOUTH EVERY 6 HOURS AS NEEDED FOR WHEEZE OR SHORTNESS OF BREATH   BUDESONIDE 90 MCG/ACT INHALER    Inhale 1 puff into the lungs 2 (two) times daily.   BUSPIRONE (BUSPAR) 15 MG TABLET    Take 1 tablet (15 mg total) by mouth 2 (two) times daily.   CETIRIZINE (ZYRTEC) 10 MG TABLET    Take 1 tablet (10 mg total) by mouth daily.   CIPROFLOXACIN (CIPRO) 500 MG TABLET    Take 1 tablet (500 mg total) by mouth every 12 (twelve) hours.   CYCLOBENZAPRINE (FLEXERIL) 5 MG TABLET    Take 1 tablet (5 mg total) by mouth 3 (three) times daily as needed for muscle spasms.    EMTRICITABINE-TENOFOVIR (TRUVADA) 200-300 MG TABLET    TAKE ONE TABLET BY MOUTH ONE TIME DAILY   GABAPENTIN (NEURONTIN) 300 MG CAPSULE    Take 1 capsule (300 mg total) by mouth at bedtime.   HYDROXYZINE (ATARAX/VISTARIL) 25 MG TABLET    Take 25 mg by mouth 3 (three) times daily.   IBUPROFEN (ADVIL) 800 MG TABLET    Take 1 tablet (800 mg total) by mouth 3 (three) times daily.   LIDOCAINE (LIDODERM) 5 %    Place 1 patch onto the skin daily. Remove & Discard patch within 12 hours or as directed by MD   LIDOCAINE HCL-BENZYL ALCOHOL (SALONPAS LIDOCAINE PLUS) 4-10 % CREA    Apply 1 application. topically every 8 (eight) hours.   LOSARTAN-HYDROCHLOROTHIAZIDE (HYZAAR) 100-25 MG TABLET    Take 1 tablet by mouth daily.   MECLIZINE (ANTIVERT) 25 MG TABLET    Take 1 tablet (25 mg total) by mouth 3 (three) times daily as needed for dizziness.   MULTIPLE VITAMIN (MULTIVITAMIN WITH MINERALS) TABS TABLET    Take 1 tablet by mouth daily.   OMEPRAZOLE (PRILOSEC) 20 MG CAPSULE    Take 1 capsule (20 mg total) by mouth daily.   ONDANSETRON (ZOFRAN) 4 MG TABLET    Take 1 tablet (4 mg total) by mouth every 8 (eight) hours as needed for nausea or vomiting.   ONDANSETRON (ZOFRAN-ODT) 4 MG DISINTEGRATING TABLET  Take 1 tablet (4 mg total) by mouth every 8 (eight) hours as needed for nausea or vomiting.  Modified Medications   No medications on file  Discontinued Medications   No medications on file    Allergies: Allergies  Allergen Reactions   Prednisone     "makes him feel weird"    Past Medical History: Past Medical History:  Diagnosis Date   Anxiety    Asthma    Coronavirus infection 01/2020   COVID    Dizziness    GERD (gastroesophageal reflux disease)    Hypertension    Seasonal allergies    Vertigo    Vitamin D deficiency     Social History: Social History   Socioeconomic History   Marital status: Significant Other    Spouse name: Not on file   Number of children: Not on file   Years of  education: Not on file   Highest education level: Not on file  Occupational History   Not on file  Tobacco Use   Smoking status: Former    Passive exposure: Never   Smokeless tobacco: Never  Vaping Use   Vaping Use: Never used  Substance and Sexual Activity   Alcohol use: Yes    Comment: Occasionally   Drug use: Yes    Types: Marijuana   Sexual activity: Yes    Comment: has male partner would like PREP.  Other Topics Concern   Not on file  Social History Narrative   Lives with his partner    Social Determinants of Health   Financial Resource Strain: Not on file  Food Insecurity: Not on file  Transportation Needs: Not on file  Physical Activity: Not on file  Stress: Not on file  Social Connections: Not on file       07/10/2022    3:12 PM  [facility] HIV PREP FLOWSHEET RESULTS  Insurance Status Uninsured    Labs:  SCr: Lab Results  Component Value Date   CREATININE 0.94 07/07/2022   CREATININE 0.84 01/09/2022   CREATININE 1.02 05/15/2021   CREATININE 0.87 12/30/2020   CREATININE 0.94 11/10/2020   HIV Lab Results  Component Value Date   HIV Non Reactive 04/10/2022   HIV Non Reactive 10/13/2021   HIV Non Reactive 05/15/2021   HIV Non Reactive 09/07/2019   HIV Non Reactive 11/20/2018   Hepatitis B Lab Results  Component Value Date   HEPBSAG Negative 04/10/2022   HEPBCAB Negative 04/10/2022   Hepatitis C No results found for: "HEPCAB", "HCVRNAPCRQN" Hepatitis A No results found for: "HAV" RPR and STI Lab Results  Component Value Date   LABRPR Non Reactive 04/10/2022   LABRPR Non Reactive 01/09/2022   LABRPR NON REACTIVE 10/13/2021   LABRPR Non Reactive 05/15/2021   LABRPR NON REACTIVE 11/20/2018    STI Results GC CT  10/13/2021  9:41 AM Negative  Negative   09/07/2019  4:32 PM Negative  Negative   07/29/2019  9:01 AM Negative  Negative   05/21/2019 11:08 AM Negative  Negative   02/12/2019  1:57 PM Negative  Negative   11/20/2018 12:00 AM Negative   Negative   05/12/2018 12:00 AM Negative  Negative   01/02/2018 12:00 AM Negative  Negative   03/28/2017 12:00 AM Negative  Negative   09/18/2016 12:00 AM Negative  Negative   12/06/2015 12:00 AM Negative  Negative   02/24/2015 12:00 AM Negative  Negative    Current PrEP Regimen: Truvada  Assessment:   Walter Howell presents today to discuss  initiation of Apretude. I discussed that with Apretude we would need to get patient assistance or he would need to pay his premium. Walter Howell is going to pay his premium since he would need prescription insurance for other medications, but in the meantime would like to retry Truvada until he is able to start Apretude. He only took one week of Truvada because it caused stomach issues. The pharmacy team explained that this is typical with PrEP medications and subsides after about two weeks of treatment. We counseled that he can take it with food or use medications like imodium if he has diarrhea and it is severe. He is aware to contact the clinic if he experiences any adverse effects from the medication.  Truvada will be called into his pharmacy once his HIV antibody returns and is negative.  I counseled the patient on Apretude since he is interested in switching to the injections once his insurance situation is resolved. I explained that Apretude is an intramuscular injections in the gluteal muscle for each visit. Explained that the second injection is 30 days after the initial injection then every 2 months thereafter. Explained that showing up to injection appointments is very important and warned that if 2 appointments are missed, it will be reassessed by their provider whether they are a good candidate for injection therapy. I counseled on possible side effects associated with the injections such as injection site pain, which is usually mild to moderate in nature, injection site nodules, and injection site reactions.   Rannon has had 4 partners in the span of 3  months and agrees to full STI testing today with RPR and oral/urine/rectal cytologies. He had no symptoms indicating an STI and states to using condoms about 70% of the time. Hepatitis A titer was ordered for the patient to assess if he needs vaccination in the future against hepatitis A.  Plan: - F/u HIV antibody and sending Truvada prescription to [facility] on [address]  - F/u STI testing: urine/oral/anal cytologies and RPR - F/u insurance premium payment and initiating Apretude for the patient - Hep A titer ordered  - F/u appointment has been scheduled for 10/16/2022 at 3:45PM with Cassie - Call if have any questions  Thanks,   Walter Howell, PharmD. [facility] Acute Care PGY-1 07/10/2022 4:01 PM

## 2022-07-11 ENCOUNTER — Telehealth: Payer: Self-pay

## 2022-07-11 LAB — RPR: RPR Ser Ql: NONREACTIVE

## 2022-07-11 LAB — HIV ANTIBODY (ROUTINE TESTING W REFLEX): HIV 1&2 Ab, 4th Generation: NONREACTIVE

## 2022-07-11 LAB — HEPATITIS A ANTIBODY, TOTAL: Hepatitis A AB,Total: NONREACTIVE

## 2022-07-11 NOTE — Transitions of Care (Post Inpatient/ED Visit) (Signed)
   07/11/2022  Name: Walter Howell MRN: [id number] DOB: 22-Jun-1985  Today's TOC FU Call Status:    Attempted to reach the patient regarding the most recent Inpatient/ED visit.  Follow Up Plan: Additional outreach attempts will be made to reach the patient to complete the Transitions of Care (Post Inpatient/ED visit) call.   Signature Gh

## 2022-07-12 ENCOUNTER — Other Ambulatory Visit: Payer: Self-pay | Admitting: Pharmacist

## 2022-07-12 ENCOUNTER — Telehealth: Payer: Self-pay

## 2022-07-12 DIAGNOSIS — Z7252 High risk homosexual behavior: Secondary | ICD-10-CM

## 2022-07-12 LAB — CYTOLOGY, (ORAL, ANAL, URETHRAL) ANCILLARY ONLY
Chlamydia: NEGATIVE
Chlamydia: NEGATIVE
Comment: NEGATIVE
Comment: NEGATIVE
Comment: NORMAL
Comment: NORMAL
Neisseria Gonorrhea: NEGATIVE
Neisseria Gonorrhea: NEGATIVE

## 2022-07-12 LAB — URINE CYTOLOGY ANCILLARY ONLY
Chlamydia: NEGATIVE
Comment: NEGATIVE
Comment: NORMAL
Neisseria Gonorrhea: NEGATIVE

## 2022-07-12 MED ORDER — EMTRICITABINE-TENOFOVIR DF 200-300 MG PO TABS
1.0000 | ORAL_TABLET | Freq: Every day | ORAL | 2 refills | Status: DC
Start: 2022-07-12 — End: 2022-07-12

## 2022-07-12 MED ORDER — EMTRICITABINE-TENOFOVIR DF 200-300 MG PO TABS
1.0000 | ORAL_TABLET | Freq: Every day | ORAL | 2 refills | Status: DC
Start: 2022-07-12 — End: 2023-01-14

## 2022-07-12 NOTE — Transitions of Care (Post Inpatient/ED Visit) (Cosign Needed)
07/12/2022  Name: Walter Howell MRN: [id number] DOB: [birth year]  Today's TOC FU Call Status: Today's TOC FU Call Status:: Successful TOC FU Call Competed TOC FU Call Complete Date: 07/12/22  Transition Care Management Follow-up Telephone Call Discharge Facility: [facility]) Type of Discharge: Emergency Department Reason for ED Visit: Other: (K was low) How have you been since you were released from the hospital?: Better Any questions or concerns?: No  Items Reviewed: Did you receive and understand the discharge instructions provided?: Yes Medications obtained,verified, and reconciled?: Yes (Medications Reviewed) Any new allergies since your discharge?: No Dietary orders reviewed?: NA Do you have support at home?: Yes People in Home: significant other  Medications Reviewed Today: Medications Reviewed Today     Reviewed by Renelda Loma, RMA (Registered Medical Assistant) on 07/12/22 at 1314  Med List Status: <None>   Medication Order Taking? Sig Documenting Provider Last Dose Status Informant  acetaminophen (TYLENOL) 500 MG tablet [id number] No Take 1,000 mg by mouth every 6 (six) hours as needed for mild pain, moderate pain, fever or headache.  Patient not taking: Reported on 04/10/2022   [provider] Not Taking Active Self           Med Note Kallie Locks   Tue Dec 29, 2019  3:36 PM) As needed.   albuterol (VENTOLIN HFA) 108 (90 Base) MCG/ACT inhaler [id number] Yes TAKE 2 PUFFS BY MOUTH EVERY 6 HOURS AS NEEDED FOR WHEEZE OR SHORTNESS OF BREATH Paseda, Folashade R, FNP Taking Active   Budesonide 90 MCG/ACT inhaler [id number] No Inhale 1 puff into the lungs 2 (two) times daily.  Patient not taking: Reported on 01/08/2022   Valinda Hoar, NP Not Taking Active   busPIRone (BUSPAR) 15 MG tablet [id number] No Take 1 tablet (15 mg total) by mouth 2 (two) times daily.  Patient not taking: Reported on 05/15/2021   Anders Simmonds, PA-C Not Taking Active             Med Note (LONG, ASHLEY L   Mon May 15, 2021  1:44 PM) PRN  cetirizine (ZYRTEC) 10 MG tablet [id number] Yes Take 1 tablet (10 mg total) by mouth daily. Barbette Merino, NP Taking Active   ciprofloxacin (CIPRO) 500 MG tablet [id number]  Take 1 tablet (500 mg total) by mouth every 12 (twelve) hours. Jeanelle Malling, PA  Active   cyclobenzaprine (FLEXERIL) 5 MG tablet [id number] Yes Take 1 tablet (5 mg total) by mouth 3 (three) times daily as needed for muscle spasms. Donell Beers, FNP Taking Active   emtricitabine-tenofovir (TRUVADA) 200-300 MG tablet [id number] Yes Take 1 tablet by mouth daily. Kuppelweiser, Cassie L, RPH-CPP Taking Active   gabapentin (NEURONTIN) 300 MG capsule [id number]  Take 1 capsule (300 mg total) by mouth at bedtime.  Patient not taking: Reported on 01/08/2022   Barbette Merino, NP  Expired 07/14/21 2359   hydrOXYzine (ATARAX/VISTARIL) 25 MG tablet [id number] Yes Take 25 mg by mouth 3 (three) times daily. [provider] Taking Active   ibuprofen (ADVIL) 800 MG tablet [id number] Yes Take 1 tablet (800 mg total) by mouth 3 (three) times daily. Donell Beers, FNP Taking Active            Med Note Raj Janus   Tue Apr 10, 2022  3:31 PM) Refill  lidocaine (LIDODERM) 5 % [id number] Yes Place 1 patch onto the skin daily. Remove & Discard patch within 12 hours or as directed by MD Thad Ranger  M, NP Taking Active            Med Note Raj Janus   Tue Apr 10, 2022  3:29 PM) Refill   Lidocaine HCl-Benzyl Alcohol (SALONPAS LIDOCAINE PLUS) 4-10 % CREA [id number] No Apply 1 application. topically every 8 (eight) hours.  Patient not taking: Reported on 01/08/2022   Barbette Merino, NP Not Taking Active   losartan-hydrochlorothiazide [facility]) 100-25 MG tablet [id number] Yes Take 1 tablet by mouth daily. Donell Beers, FNP Taking Active            Med Note Raj Janus   Tue Apr 10, 2022  3:32 PM) Refill   meclizine  (ANTIVERT) 25 MG tablet [id number] Yes Take 1 tablet (25 mg total) by mouth 3 (three) times daily as needed for dizziness. Kallie Locks, FNP Taking Active            Med Note Renelda Loma   Mon Jan 08, 2022  3:58 PM) Prn last dose two weeks ago  Multiple Vitamin (MULTIVITAMIN WITH MINERALS) TABS tablet [id number] Yes Take 1 tablet by mouth daily. [provider] Taking Active Self  omeprazole (PRILOSEC) 20 MG capsule [id number] Yes Take 1 capsule (20 mg total) by mouth daily. Donell Beers, FNP Taking Active            Med Note Raj Janus   Tue Apr 10, 2022  3:32 PM) Refill   ondansetron (ZOFRAN) 4 MG tablet [id number] Yes Take 1 tablet (4 mg total) by mouth every 8 (eight) hours as needed for nausea or vomiting. Ellsworth Lennox, PA-C Taking Active   ondansetron (ZOFRAN-ODT) 4 MG disintegrating tablet [id number] No Take 1 tablet (4 mg total) by mouth every 8 (eight) hours as needed for nausea or vomiting.  Patient not taking: Reported on 07/12/2022   Jeanelle Malling, PA Not Taking Active             Home Care and Equipment/Supplies: Were Home Health Services Ordered?: NA Any new equipment or medical supplies ordered?: NA  Functional Questionnaire: Do you need assistance with bathing/showering or dressing?: No Do you need assistance with meal preparation?: No Do you need assistance with eating?: No Do you have difficulty maintaining continence: No Do you need assistance with getting out of bed/getting out of a chair/moving?: No Do you have difficulty managing or taking your medications?: No  Follow up appointments reviewed: PCP Follow-up appointment confirmed?: Yes Date of PCP follow-up appointment?: 07/20/22 Follow-up Provider: [facility] Follow-up appointment confirmed?: NA Do you need transportation to your follow-up appointment?: No Do you understand care options if your condition(s) worsen?: Yes-patient verbalized  understanding    SIGNATURE Renelda Loma RMA

## 2022-07-20 ENCOUNTER — Inpatient Hospital Stay: Payer: Self-pay | Admitting: Nurse Practitioner

## 2022-08-01 ENCOUNTER — Other Ambulatory Visit: Payer: Self-pay

## 2022-08-01 DIAGNOSIS — I10 Essential (primary) hypertension: Secondary | ICD-10-CM

## 2022-08-02 ENCOUNTER — Other Ambulatory Visit: Payer: Self-pay

## 2022-08-02 DIAGNOSIS — I10 Essential (primary) hypertension: Secondary | ICD-10-CM

## 2022-08-02 MED ORDER — LOSARTAN POTASSIUM-HCTZ 100-25 MG PO TABS
1.0000 | ORAL_TABLET | Freq: Every day | ORAL | 0 refills | Status: DC
Start: 2022-08-02 — End: 2022-09-26

## 2022-08-21 ENCOUNTER — Ambulatory Visit (INDEPENDENT_AMBULATORY_CARE_PROVIDER_SITE_OTHER): Payer: Self-pay | Admitting: Nurse Practitioner

## 2022-08-21 VITALS — BP 133/80 | HR 58 | Temp 98.1°F | Ht 71.0 in | Wt 230.2 lb

## 2022-08-21 DIAGNOSIS — Z202 Contact with and (suspected) exposure to infections with a predominantly sexual mode of transmission: Secondary | ICD-10-CM | POA: Insufficient documentation

## 2022-08-21 MED ORDER — CEFIXIME 400 MG PO CAPS
800.0000 mg | ORAL_CAPSULE | Freq: Once | ORAL | 0 refills | Status: AC
Start: 2022-08-21 — End: 2022-08-21

## 2022-08-21 MED ORDER — CEFIXIME 400 MG PO CAPS
800.0000 mg | ORAL_CAPSULE | Freq: Once | ORAL | 0 refills | Status: DC
Start: 2022-08-21 — End: 2022-08-21

## 2022-08-21 MED ORDER — DOXYCYCLINE HYCLATE 100 MG PO TABS
100.0000 mg | ORAL_TABLET | Freq: Two times a day (BID) | ORAL | 0 refills | Status: DC
Start: 2022-08-21 — End: 2022-10-09

## 2022-08-21 MED ORDER — DOXYCYCLINE HYCLATE 100 MG PO TABS
100.0000 mg | ORAL_TABLET | Freq: Two times a day (BID) | ORAL | 0 refills | Status: DC
Start: 2022-08-21 — End: 2022-08-21

## 2022-08-21 NOTE — Patient Instructions (Signed)
1. Exposure to gonorrhea  - Chlamydia/Gonococcus/Trichomonas, NAA - cefixime (SUPRAX) 400 MG CAPS capsule; Take 2 capsules (800 mg total) by mouth once for 1 dose.  Dispense: 2 capsule; Refill: 0 - doxycycline (VIBRA-TABS) 100 MG tablet; Take 1 tablet (100 mg total) by mouth 2 (two) times daily.  Dispense: 14 tablet; Refill: 0    It is important that you exercise regularly at least 30 minutes 5 times a week as tolerated  Think about what you will eat, plan ahead. Choose " clean, green, fresh or frozen" over canned, processed or packaged foods which are more sugary, salty and fatty. 70 to 75% of food eaten should be vegetables and fruit. Three meals at set times with snacks allowed between meals, but they must be fruit or vegetables. Aim to eat over a 12 hour period , example 7 am to 7 pm, and STOP after  your last meal of the day. Drink water,generally about 64 ounces per day, no other drink is as healthy. Fruit juice is best enjoyed in a healthy way, by EATING the fruit.  Thanks for choosing [facility] we consider it a privelige to serve you.

## 2022-08-21 NOTE — Assessment & Plan Note (Signed)
1. Exposure to gonorrhea  - Chlamydia/Gonococcus/Trichomonas, NAA - cefixime (SUPRAX) 400 MG CAPS capsule; Take 2 capsules (800 mg total) by mouth once for 1 dose.  Dispense: 2 capsule; Refill: 0 - doxycycline (VIBRA-TABS) 100 MG tablet; Take 1 tablet (100 mg total) by mouth 2 (two) times daily.  Dispense: 14 tablet; Refill: 0  Encouraged to avoid until completion of medication Maintain close follow-up with infectious disease clinic for PrEP

## 2022-08-21 NOTE — Progress Notes (Signed)
Acute Office Visit  Subjective:     Patient ID: Walter Howell, male    DOB: Sep 21, 1985, 37 y.o.   MRN: [id number]  Chief Complaint  Patient presents with   std screening    HPI Mr. Rathke  has a past medical history of Anxiety, Asthma, Coronavirus infection (01/2020), COVID, Dizziness, GERD (gastroesophageal reflux disease), Hypertension, Seasonal allergies, Vertigo, and Vitamin D deficiency.   Patient presents for STD testing, stated that he was exposed to gonorrhea from his sexual partner.  He currently has 1 sexual partner, he prefers male partners.  He reports  penile burning sensation, he denies fever, chills, penile discharge, rashes.    Review of Systems  Constitutional:  Negative for activity change, appetite change, chills, diaphoresis, fatigue, fever and unexpected weight change.  HENT:  Negative for congestion, dental problem, drooling and ear discharge.   Eyes:  Negative for pain, discharge, redness and itching.  Respiratory:  Negative for apnea, cough, choking, chest tightness, shortness of breath and wheezing.   Cardiovascular: Negative.  Negative for chest pain, palpitations and leg swelling.  Gastrointestinal:  Negative for abdominal distention, abdominal pain, anal bleeding, blood in stool, constipation, diarrhea and vomiting.  Endocrine: Negative for polydipsia, polyphagia and polyuria.  Genitourinary:  Negative for difficulty urinating, flank pain, frequency and genital sores.  Musculoskeletal: Negative.  Negative for arthralgias, back pain, gait problem and joint swelling.  Skin:  Negative for color change, pallor and rash.  Neurological:  Negative for dizziness, facial asymmetry, light-headedness, numbness and headaches.  Psychiatric/Behavioral:  Negative for agitation, behavioral problems, confusion, hallucinations, self-injury, sleep disturbance and suicidal ideas.         Objective:    BP 133/80   Pulse (!) 58   Temp 98.1 F (36.7 C) (Oral)    Ht 5\' 11"  (1.803 m)   Wt 230 lb 3.2 oz (104.4 kg)   SpO2 98%   BMI 32.11 kg/m    Physical Exam Vitals and nursing note reviewed.  Constitutional:      General: He is not in acute distress.    Appearance: Normal appearance. He is obese. He is not ill-appearing, toxic-appearing or diaphoretic.  HENT:     Mouth/Throat:     Mouth: Mucous membranes are moist.     Pharynx: Oropharynx is clear. No oropharyngeal exudate or posterior oropharyngeal erythema.  Eyes:     General: No scleral icterus.       Right eye: No discharge.        Left eye: No discharge.     Extraocular Movements: Extraocular movements intact.     Conjunctiva/sclera: Conjunctivae normal.  Cardiovascular:     Rate and Rhythm: Normal rate and regular rhythm.     Pulses: Normal pulses.     Heart sounds: Normal heart sounds. No murmur heard.    No friction rub. No gallop.  Pulmonary:     Effort: Pulmonary effort is normal. No respiratory distress.     Breath sounds: Normal breath sounds. No stridor. No wheezing, rhonchi or rales.  Chest:     Chest wall: No tenderness.  Abdominal:     General: There is no distension.     Palpations: Abdomen is soft.     Tenderness: There is no abdominal tenderness. There is no right CVA tenderness, left CVA tenderness or guarding.  Musculoskeletal:        General: No swelling, tenderness, deformity or signs of injury.     Right lower leg: No edema.  Left lower leg: No edema.  Skin:    General: Skin is warm and dry.     Capillary Refill: Capillary refill takes 2 to 3 seconds.     Coloration: Skin is not jaundiced or pale.     Findings: No bruising, erythema or lesion.  Neurological:     Mental Status: He is alert and oriented to person, place, and time.     Motor: No weakness.     Coordination: Coordination normal.     Gait: Gait normal.  Psychiatric:        Mood and Affect: Mood normal.        Behavior: Behavior normal.        Thought Content: Thought content normal.         Judgment: Judgment normal.     No results found for any visits on 08/21/22.      Assessment & Plan:   Problem List Items Addressed This Visit       Other   Exposure to gonorrhea - Primary    1. Exposure to gonorrhea  - Chlamydia/Gonococcus/Trichomonas, NAA - cefixime (SUPRAX) 400 MG CAPS capsule; Take 2 capsules (800 mg total) by mouth once for 1 dose.  Dispense: 2 capsule; Refill: 0 - doxycycline (VIBRA-TABS) 100 MG tablet; Take 1 tablet (100 mg total) by mouth 2 (two) times daily.  Dispense: 14 tablet; Refill: 0  Encouraged to avoid until completion of medication Maintain close follow-up with infectious disease clinic for PrEP      Relevant Medications   cefixime (SUPRAX) 400 MG CAPS capsule   doxycycline (VIBRA-TABS) 100 MG tablet   Other Relevant Orders   Chlamydia/Gonococcus/Trichomonas, NAA    Meds ordered this encounter  Medications   DISCONTD: cefixime (SUPRAX) 400 MG CAPS capsule    Sig: Take 2 capsules (800 mg total) by mouth once for 1 dose.    Dispense:  2 capsule    Refill:  0   DISCONTD: doxycycline (VIBRA-TABS) 100 MG tablet    Sig: Take 1 tablet (100 mg total) by mouth 2 (two) times daily.    Dispense:  14 tablet    Refill:  0   cefixime (SUPRAX) 400 MG CAPS capsule    Sig: Take 2 capsules (800 mg total) by mouth once for 1 dose.    Dispense:  2 capsule    Refill:  0   doxycycline (VIBRA-TABS) 100 MG tablet    Sig: Take 1 tablet (100 mg total) by mouth 2 (two) times daily.    Dispense:  14 tablet    Refill:  0    No follow-ups on file.  Donell Beers, FNP

## 2022-08-23 LAB — CHLAMYDIA/GONOCOCCUS/TRICHOMONAS, NAA
Chlamydia by NAA: NEGATIVE
Gonococcus by NAA: NEGATIVE
Trich vag by NAA: NEGATIVE

## 2022-09-26 ENCOUNTER — Other Ambulatory Visit: Payer: Self-pay | Admitting: Nurse Practitioner

## 2022-09-26 DIAGNOSIS — I10 Essential (primary) hypertension: Secondary | ICD-10-CM

## 2022-09-26 DIAGNOSIS — G8929 Other chronic pain: Secondary | ICD-10-CM

## 2022-09-26 DIAGNOSIS — Z202 Contact with and (suspected) exposure to infections with a predominantly sexual mode of transmission: Secondary | ICD-10-CM

## 2022-09-26 MED ORDER — LOSARTAN POTASSIUM-HCTZ 100-25 MG PO TABS
1.0000 | ORAL_TABLET | Freq: Every day | ORAL | 1 refills | Status: DC
Start: 2022-09-26 — End: 2022-10-09

## 2022-09-26 MED ORDER — CYCLOBENZAPRINE HCL 5 MG PO TABS: 5.0000 mg | ORAL_TABLET | Freq: Three times a day (TID) | ORAL | 0 refills | Status: DC | PRN

## 2022-09-26 NOTE — Telephone Encounter (Signed)
Caller & Relationship to patient:  MRN #  [id number]   Call Back Number:   Date of Last Office Visit: 09/26/2022     Date of Next Office Visit: 10/09/2022    Medication(s) to be Refilled: pt states needs "everything" would not specify   Preferred Pharmacy:   ** Please notify patient to allow 48-72 hours to process** **Let patient know to contact pharmacy at the end of the day to make sure medication is ready. ** **If patient has not been seen in a year or longer, book an appointment **Advise to use MyChart for refill requests OR to contact their pharmacy

## 2022-10-09 ENCOUNTER — Encounter: Payer: Self-pay | Admitting: Nurse Practitioner

## 2022-10-09 ENCOUNTER — Ambulatory Visit (INDEPENDENT_AMBULATORY_CARE_PROVIDER_SITE_OTHER): Payer: Medicaid Other | Admitting: Nurse Practitioner

## 2022-10-09 VITALS — BP 137/72 | HR 59 | Wt 231.2 lb

## 2022-10-09 DIAGNOSIS — Z7252 High risk homosexual behavior: Secondary | ICD-10-CM | POA: Diagnosis not present

## 2022-10-09 DIAGNOSIS — G8929 Other chronic pain: Secondary | ICD-10-CM

## 2022-10-09 DIAGNOSIS — I10 Essential (primary) hypertension: Secondary | ICD-10-CM

## 2022-10-09 DIAGNOSIS — M25511 Pain in right shoulder: Secondary | ICD-10-CM

## 2022-10-09 DIAGNOSIS — K219 Gastro-esophageal reflux disease without esophagitis: Secondary | ICD-10-CM | POA: Diagnosis not present

## 2022-10-09 DIAGNOSIS — F419 Anxiety disorder, unspecified: Secondary | ICD-10-CM

## 2022-10-09 DIAGNOSIS — Z6832 Body mass index (BMI) 32.0-32.9, adult: Secondary | ICD-10-CM

## 2022-10-09 DIAGNOSIS — E6609 Other obesity due to excess calories: Secondary | ICD-10-CM

## 2022-10-09 DIAGNOSIS — F32A Depression, unspecified: Secondary | ICD-10-CM

## 2022-10-09 MED ORDER — LOSARTAN POTASSIUM-HCTZ 100-25 MG PO TABS: 0.5000 | ORAL_TABLET | Freq: Every day | ORAL | 0 refills | Status: DC

## 2022-10-09 NOTE — Assessment & Plan Note (Signed)
Controlled on Tylenol as needed also goes to the chiropractor

## 2022-10-09 NOTE — Assessment & Plan Note (Signed)
Wt Readings from Last 3 Encounters:  10/09/22 231 lb 3.2 oz (104.9 kg)  08/21/22 230 lb 3.2 oz (104.4 kg)  07/07/22 233 lb 11 oz (106 kg)   Body mass index is 32.25 kg/m.  Patient counseled on low-carb modified diet encouraged to engage in regular moderate to vigorous exercises at least 150 minutes weekly

## 2022-10-09 NOTE — Patient Instructions (Addendum)
1. Chronic right shoulder pain   2. Essential hypertension  - Basic Metabolic Panel  3. High risk homosexual behavior  - HepB+HepC+HIV Panel - Chlamydia/Gonococcus/Trichomonas, NAA    It is important that you exercise regularly at least 30 minutes 5 times a week as tolerated  Think about what you will eat, plan ahead. Choose " clean, green, fresh or frozen" over canned, processed or packaged foods which are more sugary, salty and fatty. 70 to 75% of food eaten should be vegetables and fruit. Three meals at set times with snacks allowed between meals, but they must be fruit or vegetables. Aim to eat over a 12 hour period , example 7 am to 7 pm, and STOP after  your last meal of the day. Drink water,generally about 64 ounces per day, no other drink is as healthy. Fruit juice is best enjoyed in a healthy way, by EATING the fruit.  Thanks for choosing [facility] we consider it a privelige to serve you.

## 2022-10-09 NOTE — Assessment & Plan Note (Addendum)
BP Readings from Last 3 Encounters:  10/09/22 137/72  08/21/22 133/80  07/08/22 117/73  HTN losartan-hydrochlorothiazide 100-25 mg(0.5 tablet) daily Continue current medications. No changes in management. Discussed DASH diet and dietary sodium restrictions Continue to increase dietary efforts and exercise.  CMP ordered blood pressure goal is less than 140/90 Follow-up in 6 months

## 2022-10-09 NOTE — Assessment & Plan Note (Addendum)
Flowsheet Row Office Visit from 08/21/2022 in [facility]  PHQ-9 Total Score 0      .Currently not on medication was on buspirone but has not been taking the medication, we will continue to monitor

## 2022-10-09 NOTE — Progress Notes (Signed)
Established Patient Office Visit  Subjective:  Patient ID: Walter Howell, male    DOB: 1985-05-27  Age: 37 y.o. MRN: [id number]  CC:  Chief Complaint  Patient presents with   Hypertension    HPI Walter Howell is a 37 y.o. male  has a past medical history of Anxiety, Asthma, Coronavirus infection (01/2020), COVID, Dizziness, GERD (gastroesophageal reflux disease), Hypertension, Seasonal allergies, Vertigo, and Vitamin D deficiency.  Patient presents for follow-up for his chronic medical conditions  Hypertension.  Currently on losartan hydrochlorothiazide 100-25 mg daily 0.5 tablet daily.  Stated that he has been taking half tablet for months , and that his blood pressure readings has been well-controlled at home, he is now following a heart healthy diet and has been going to the gym for exercises.  He denies chest pain, shortness of breath, edema,  He goes to the chiropractor for his chronic shoulder pain and takes Tylenol as needed.  Has gabapentin ordered but he prefers to take Tylenol as needed.   Patient requesting for STD testing today.  No complaints of fever, chills, malaise penile discharge.  He continues on Truvada for PrEP.   Past Medical History:  Diagnosis Date   Anxiety    Asthma    Coronavirus infection 01/2020   COVID    Dizziness    GERD (gastroesophageal reflux disease)    Hypertension    Seasonal allergies    Vertigo    Vitamin D deficiency     No past surgical history on file.  Family History  Problem Relation Age of Onset   Diabetes Maternal Grandmother    Hypertension Maternal Grandmother    Diabetes Maternal Grandfather    Diabetes Paternal Grandmother    Heart disease Paternal Grandmother    Diabetes Paternal Grandfather    Stroke Neg Hx    Kidney disease Neg Hx     Social History   Socioeconomic History   Marital status: Significant Other    Spouse name: Not on file   Number of children: Not on file   Years of education:  Not on file   Highest education level: Not on file  Occupational History   Not on file  Tobacco Use   Smoking status: Former    Passive exposure: Never   Smokeless tobacco: Never  Vaping Use   Vaping status: Never Used  Substance and Sexual Activity   Alcohol use: Yes    Comment: Occasionally   Drug use: Yes    Types: Marijuana   Sexual activity: Yes    Comment: has male partner would like PREP.  Other Topics Concern   Not on file  Social History Narrative   Lives with his partner    Social Determinants of Health   Financial Resource Strain: Not on file  Food Insecurity: Not on file  Transportation Needs: Not on file  Physical Activity: Not on file  Stress: Not on file  Social Connections: Unknown (07/10/2021)   [facility] from [facility]   Social Network    Social Network: Not on file  Intimate Partner Violence: Unknown (06/01/2021)   Received from [facility]   HITS    Physically Hurt: Not on file    Insult or Talk Down To: Not on file    Threaten Physical Harm: Not on file    Scream or Curse: Not on file    Outpatient Medications Prior to Visit  Medication Sig Dispense Refill   albuterol (VENTOLIN HFA) 108 (90 Base) MCG/ACT  inhaler TAKE 2 PUFFS BY MOUTH EVERY 6 HOURS AS NEEDED FOR WHEEZE OR SHORTNESS OF BREATH 18 each 2   cetirizine (ZYRTEC) 10 MG tablet Take 1 tablet (10 mg total) by mouth daily. 90 tablet 3   cyclobenzaprine (FLEXERIL) 5 MG tablet Take 1 tablet (5 mg total) by mouth 3 (three) times daily as needed for muscle spasms. 30 tablet 0   emtricitabine-tenofovir (TRUVADA) 200-300 MG tablet Take 1 tablet by mouth daily. 30 tablet 2   meclizine (ANTIVERT) 25 MG tablet Take 1 tablet (25 mg total) by mouth 3 (three) times daily as needed for dizziness. 90 tablet 6   Multiple Vitamin (MULTIVITAMIN WITH MINERALS) TABS tablet Take 1 tablet by mouth daily.     omeprazole (PRILOSEC) 20 MG capsule Take 1 capsule (20 mg total) by mouth daily. 90 capsule 0    ondansetron (ZOFRAN) 4 MG tablet Take 1 tablet (4 mg total) by mouth every 8 (eight) hours as needed for nausea or vomiting. 20 tablet 0   losartan-hydrochlorothiazide (HYZAAR) 100-25 MG tablet Take 1 tablet by mouth daily. 90 tablet 1   acetaminophen (TYLENOL) 500 MG tablet Take 1,000 mg by mouth every 6 (six) hours as needed for mild pain, moderate pain, fever or headache. (Patient not taking: Reported on 04/10/2022)     Budesonide 90 MCG/ACT inhaler Inhale 1 puff into the lungs 2 (two) times daily. (Patient not taking: Reported on 01/08/2022) 1 each 0   lidocaine (LIDODERM) 5 % Place 1 patch onto the skin daily. Remove & Discard patch within 12 hours or as directed by MD (Patient not taking: Reported on 08/21/2022) 30 patch 0   Lidocaine HCl-Benzyl Alcohol (SALONPAS LIDOCAINE PLUS) 4-10 % CREA Apply 1 application. topically every 8 (eight) hours. (Patient not taking: Reported on 01/08/2022) 85 g 1   busPIRone (BUSPAR) 15 MG tablet Take 1 tablet (15 mg total) by mouth 2 (two) times daily. (Patient not taking: Reported on 05/15/2021) 60 tablet 3   ciprofloxacin (CIPRO) 500 MG tablet Take 1 tablet (500 mg total) by mouth every 12 (twelve) hours. (Patient not taking: Reported on 08/21/2022) 10 tablet 0   doxycycline (VIBRA-TABS) 100 MG tablet Take 1 tablet (100 mg total) by mouth 2 (two) times daily. (Patient not taking: Reported on 10/09/2022) 14 tablet 0   gabapentin (NEURONTIN) 300 MG capsule Take 1 capsule (300 mg total) by mouth at bedtime. (Patient not taking: Reported on 01/08/2022) 60 capsule 1   hydrOXYzine (ATARAX/VISTARIL) 25 MG tablet Take 25 mg by mouth 3 (three) times daily. (Patient not taking: Reported on 08/21/2022)     ibuprofen (ADVIL) 800 MG tablet Take 1 tablet (800 mg total) by mouth 3 (three) times daily. (Patient not taking: Reported on 08/21/2022) 21 tablet 0   ondansetron (ZOFRAN-ODT) 4 MG disintegrating tablet Take 1 tablet (4 mg total) by mouth every 8 (eight) hours as needed for  nausea or vomiting. (Patient not taking: Reported on 07/12/2022) 20 tablet 0   No facility-administered medications prior to visit.    Allergies  Allergen Reactions   Prednisone     "makes him feel weird"    ROS Review of Systems  Constitutional:  Negative for activity change, appetite change, chills, diaphoresis, fatigue, fever and unexpected weight change.  HENT:  Negative for congestion, dental problem, drooling and ear discharge.   Eyes:  Negative for pain, discharge, redness and itching.  Respiratory:  Negative for apnea, cough, choking, chest tightness, shortness of breath and wheezing.   Cardiovascular: Negative.  Negative for chest pain, palpitations and leg swelling.  Gastrointestinal:  Negative for abdominal distention, abdominal pain, anal bleeding, blood in stool, constipation, diarrhea and vomiting.  Endocrine: Negative for polydipsia, polyphagia and polyuria.  Genitourinary:  Negative for difficulty urinating, flank pain, frequency and genital sores.  Musculoskeletal:  Positive for arthralgias. Negative for back pain, gait problem and joint swelling.  Skin:  Negative for color change, pallor and rash.  Neurological:  Negative for dizziness, facial asymmetry, light-headedness, numbness and headaches.  Psychiatric/Behavioral:  Negative for agitation, behavioral problems, confusion, hallucinations, self-injury, sleep disturbance and suicidal ideas.       Objective:    Physical Exam Vitals and nursing note reviewed.  Constitutional:      General: He is not in acute distress.    Appearance: Normal appearance. He is obese. He is not ill-appearing, toxic-appearing or diaphoretic.  HENT:     Mouth/Throat:     Mouth: Mucous membranes are moist.     Pharynx: Oropharynx is clear. No oropharyngeal exudate or posterior oropharyngeal erythema.  Eyes:     General: No scleral icterus.       Right eye: No discharge.        Left eye: No discharge.     Extraocular Movements:  Extraocular movements intact.     Conjunctiva/sclera: Conjunctivae normal.  Cardiovascular:     Rate and Rhythm: Normal rate and regular rhythm.     Pulses: Normal pulses.     Heart sounds: Normal heart sounds. No murmur heard.    No friction rub. No gallop.  Pulmonary:     Effort: Pulmonary effort is normal. No respiratory distress.     Breath sounds: Normal breath sounds. No stridor. No wheezing, rhonchi or rales.  Chest:     Chest wall: No tenderness.  Abdominal:     General: There is no distension.     Palpations: Abdomen is soft.     Tenderness: There is no abdominal tenderness. There is no right CVA tenderness, left CVA tenderness or guarding.  Musculoskeletal:        General: No swelling, deformity or signs of injury.     Right lower leg: No edema.     Left lower leg: No edema.  Skin:    General: Skin is warm and dry.     Capillary Refill: Capillary refill takes less than 2 seconds.     Coloration: Skin is not jaundiced or pale.     Findings: No bruising, erythema or lesion.  Neurological:     Mental Status: He is alert and oriented to person, place, and time.     Motor: No weakness.     Coordination: Coordination normal.     Gait: Gait normal.  Psychiatric:        Mood and Affect: Mood normal.        Behavior: Behavior normal.        Thought Content: Thought content normal.        Judgment: Judgment normal.     BP 137/72   Pulse (!) 59   Wt 231 lb 3.2 oz (104.9 kg)   SpO2 97%   BMI 32.25 kg/m  Wt Readings from Last 3 Encounters:  10/09/22 231 lb 3.2 oz (104.9 kg)  08/21/22 230 lb 3.2 oz (104.4 kg)  07/07/22 233 lb 11 oz (106 kg)    Lab Results  Component Value Date   TSH 2.710 04/10/2022   Lab Results  Component Value Date   WBC 10.0 07/07/2022  HGB 15.4 07/07/2022   HCT 43.7 07/07/2022   MCV 90.9 07/07/2022   PLT 179 07/07/2022   Lab Results  Component Value Date   NA 133 (L) 07/07/2022   K 2.7 (LL) 07/07/2022   CO2 22 07/07/2022   GLUCOSE  109 (H) 07/07/2022   BUN 7 07/07/2022   CREATININE 0.94 07/07/2022   BILITOT 0.7 07/07/2022   ALKPHOS 34 (L) 07/07/2022   AST 19 07/07/2022   ALT 20 07/07/2022   PROT 6.8 07/07/2022   ALBUMIN 3.7 07/07/2022   CALCIUM 8.3 (L) 07/07/2022   ANIONGAP 10 07/07/2022   EGFR 117 01/09/2022   Lab Results  Component Value Date   CHOL 174 04/10/2022   Lab Results  Component Value Date   HDL 42 04/10/2022   Lab Results  Component Value Date   LDLCALC 114 (H) 04/10/2022   Lab Results  Component Value Date   TRIG 97 04/10/2022   Lab Results  Component Value Date   CHOLHDL 4.1 04/10/2022   Lab Results  Component Value Date   HGBA1C 5.6 04/10/2022      Assessment & Plan:   Problem List Items Addressed This Visit       Cardiovascular and Mediastinum   Essential hypertension - Primary (Chronic)    BP Readings from Last 3 Encounters:  10/09/22 137/72  08/21/22 133/80  07/08/22 117/73  HTN losartan-hydrochlorothiazide 100-25 mg(0.5 tablet) daily Continue current medications. No changes in management. Discussed DASH diet and dietary sodium restrictions Continue to increase dietary efforts and exercise.  CMP ordered blood pressure goal is less than 140/90 Follow-up in 6 months        Relevant Medications   losartan-hydrochlorothiazide (HYZAAR) 100-25 MG tablet   Other Relevant Orders   Basic Metabolic Panel     Digestive   Gastroesophageal reflux disease without esophagitis    Well-controlled on omeprazole 20 mg daily Continue current medication Avoid fatty fried foods spicy food alcohol and other offending foods        Other   Anxiety and depression (Chronic)    Flowsheet Row Office Visit from 08/21/2022 in [facility]  PHQ-9 Total Score 0      .Currently not on medication was on buspirone but has not been taking the medication, we will continue to monitor      Obesity with serious comorbidity    Wt Readings from Last 3 Encounters:   10/09/22 231 lb 3.2 oz (104.9 kg)  08/21/22 230 lb 3.2 oz (104.4 kg)  07/07/22 233 lb 11 oz (106 kg)   Body mass index is 32.25 kg/m.  Patient counseled on low-carb modified diet encouraged to engage in regular moderate to vigorous exercises at least 150 minutes weekly      Chronic right shoulder pain    Controlled on Tylenol as needed also goes to the chiropractor      High risk homosexual behavior     - HepB+HepC+HIV Panel - Chlamydia/Gonococcus/Trichomonas, NAA  Continue Truvada and maintain close follow-up with infectious disease specialist      Relevant Orders   HepB+HepC+HIV Panel   Chlamydia/Gonococcus/Trichomonas, NAA    Meds ordered this encounter  Medications   losartan-hydrochlorothiazide (HYZAAR) 100-25 MG tablet    Sig: Take 0.5 tablets by mouth daily.    Dispense:  90 tablet    Refill:  0    Follow-up: Return in about 6 months (around 04/15/2023) for CPE.    Donell Beers, FNP

## 2022-10-09 NOTE — Assessment & Plan Note (Addendum)
Well-controlled on omeprazole 20 mg daily Continue current medication Avoid fatty fried foods spicy food alcohol and other offending foods

## 2022-10-09 NOTE — Assessment & Plan Note (Signed)
-   HepB+HepC+HIV Panel - Chlamydia/Gonococcus/Trichomonas, NAA  Continue Truvada and maintain close follow-up with infectious disease specialist

## 2022-10-15 NOTE — Progress Notes (Incomplete)
Date:  10/15/2022   HPI: Walter Howell is a 37 y.o. male who presents to the [facility] pharmacy clinic for HIV PrEP follow-up.  Insured   [x]    Uninsured  []    Patient Active Problem List   Diagnosis Date Noted   Exposure to gonorrhea 08/21/2022   Annual physical exam 04/10/2022   High risk homosexual behavior 04/10/2022   Marijuana user 04/10/2022   Blurry vision, bilateral 04/10/2022   Hyperkalemia 01/10/2022   Chronic right shoulder pain 01/08/2022   High risk bisexual behavior 01/08/2022   Mild intermittent asthma without complication 09/04/2019   Obesity with serious comorbidity 09/04/2019   Benign paroxysmal positional vertigo 07/23/2017   TMJ (temporomandibular joint disorder) 07/23/2017   Gastroesophageal reflux disease without esophagitis 07/23/2017   Chest pain 01/23/2017   History of herniated intervertebral disc 12/25/2016   Palpitations 12/25/2016   Anxiety and depression 12/06/2015   Chronic bilateral low back pain without sciatica 12/06/2015   Essential hypertension 07/08/2013    Patient's Medications  New Prescriptions   No medications on file  Previous Medications   ACETAMINOPHEN (TYLENOL) 500 MG TABLET    Take 1,000 mg by mouth every 6 (six) hours as needed for mild pain, moderate pain, fever or headache.   ALBUTEROL (VENTOLIN HFA) 108 (90 BASE) MCG/ACT INHALER    TAKE 2 PUFFS BY MOUTH EVERY 6 HOURS AS NEEDED FOR WHEEZE OR SHORTNESS OF BREATH   BUDESONIDE 90 MCG/ACT INHALER    Inhale 1 puff into the lungs 2 (two) times daily.   CETIRIZINE (ZYRTEC) 10 MG TABLET    Take 1 tablet (10 mg total) by mouth daily.   CYCLOBENZAPRINE (FLEXERIL) 5 MG TABLET    Take 1 tablet (5 mg total) by mouth 3 (three) times daily as needed for muscle spasms.   EMTRICITABINE-TENOFOVIR (TRUVADA) 200-300 MG TABLET    Take 1 tablet by mouth daily.   LIDOCAINE (LIDODERM) 5 %    Place 1 patch onto the skin daily. Remove & Discard patch within 12 hours or as directed by MD   LIDOCAINE  HCL-BENZYL ALCOHOL (SALONPAS LIDOCAINE PLUS) 4-10 % CREA    Apply 1 application. topically every 8 (eight) hours.   LOSARTAN-HYDROCHLOROTHIAZIDE (HYZAAR) 100-25 MG TABLET    Take 0.5 tablets by mouth daily.   MECLIZINE (ANTIVERT) 25 MG TABLET    Take 1 tablet (25 mg total) by mouth 3 (three) times daily as needed for dizziness.   MULTIPLE VITAMIN (MULTIVITAMIN WITH MINERALS) TABS TABLET    Take 1 tablet by mouth daily.   OMEPRAZOLE (PRILOSEC) 20 MG CAPSULE    Take 1 capsule (20 mg total) by mouth daily.   ONDANSETRON (ZOFRAN) 4 MG TABLET    Take 1 tablet (4 mg total) by mouth every 8 (eight) hours as needed for nausea or vomiting.  Modified Medications   No medications on file  Discontinued Medications   No medications on file       07/10/2022    3:12 PM  CHL HIV PREP FLOWSHEET RESULTS  Insurance Status Uninsured  Gender at birth Male  Gender identity cis-Male  Sex Partners Men only  # sex partners past 3-6 mos 4  Sex activity preferences Oral;Insertive and receptive  Condom use Yes  % condom use 70  Treated for STI? No  PrEP Eligibility Yes    Labs:  SCr: Lab Results  Component Value Date   CREATININE 0.94 10/10/2022   CREATININE 0.94 07/07/2022   CREATININE 0.84 01/09/2022  CREATININE 1.02 05/15/2021   CREATININE 0.87 12/30/2020   HIV Lab Results  Component Value Date   HIV Non Reactive 10/10/2022   HIV NON-REACTIVE 07/10/2022   HIV Non Reactive 04/10/2022   HIV Non Reactive 10/13/2021   HIV Non Reactive 05/15/2021   Hepatitis B Lab Results  Component Value Date   HEPBSAG Negative 10/10/2022   HEPBCAB Negative 10/10/2022   Hepatitis C No results found for: "HEPCAB", "HCVRNAPCRQN" Hepatitis A Lab Results  Component Value Date   HAV NON-REACTIVE 07/10/2022   RPR and STI Lab Results  Component Value Date   LABRPR NON-REACTIVE 07/10/2022   LABRPR Non Reactive 04/10/2022   LABRPR Non Reactive 01/09/2022   LABRPR NON REACTIVE 10/13/2021   LABRPR Non  Reactive 05/15/2021    STI Results GC CT  07/10/2022  3:34 PM Negative    Negative    Negative  Negative    Negative    Negative   10/13/2021  9:41 AM Negative  Negative   09/07/2019  4:32 PM Negative  Negative   07/29/2019  9:01 AM Negative  Negative   05/21/2019 11:08 AM Negative  Negative   02/12/2019  1:57 PM Negative  Negative   11/20/2018 12:00 AM Negative  Negative   05/12/2018 12:00 AM Negative  Negative   01/02/2018 12:00 AM Negative  Negative   03/28/2017 12:00 AM Negative  Negative   09/18/2016 12:00 AM Negative  Negative   12/06/2015 12:00 AM Negative  Negative   02/24/2015 12:00 AM Negative  Negative     Assessment: ***  Plan: ***  Lennie Muckle, PharmD PGY1 Pharmacy Resident 10/15/2022 12:03 PM

## 2022-10-16 ENCOUNTER — Other Ambulatory Visit (HOSPITAL_COMMUNITY): Payer: Self-pay

## 2022-10-16 ENCOUNTER — Ambulatory Visit: Payer: Medicaid Other | Admitting: Pharmacist

## 2022-10-20 ENCOUNTER — Other Ambulatory Visit: Payer: Self-pay | Admitting: Nurse Practitioner

## 2022-10-20 DIAGNOSIS — K219 Gastro-esophageal reflux disease without esophagitis: Secondary | ICD-10-CM

## 2022-12-17 ENCOUNTER — Ambulatory Visit: Payer: Self-pay | Admitting: Nurse Practitioner

## 2023-01-09 ENCOUNTER — Ambulatory Visit (INDEPENDENT_AMBULATORY_CARE_PROVIDER_SITE_OTHER): Payer: Medicaid Other | Admitting: Nurse Practitioner

## 2023-01-09 ENCOUNTER — Encounter: Payer: Self-pay | Admitting: Nurse Practitioner

## 2023-01-09 ENCOUNTER — Other Ambulatory Visit: Payer: Self-pay | Admitting: Infectious Diseases

## 2023-01-09 VITALS — BP 135/71 | HR 53 | Temp 97.0°F | Wt 230.0 lb

## 2023-01-09 DIAGNOSIS — R7611 Nonspecific reaction to tuberculin skin test without active tuberculosis: Secondary | ICD-10-CM | POA: Diagnosis not present

## 2023-01-09 DIAGNOSIS — Z7252 High risk homosexual behavior: Secondary | ICD-10-CM

## 2023-01-09 NOTE — Assessment & Plan Note (Signed)
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

## 2023-01-09 NOTE — Assessment & Plan Note (Addendum)
Takes Truvada for PrEP  - GC/Chlamydia Probe Amp([facility]) - HepB+HepC+HIV Panel - RPR

## 2023-01-09 NOTE — Progress Notes (Signed)
Acute Office Visit  Subjective:     Patient ID: Walter Howell, male    DOB: 06-22-1985, 37 y.o.   MRN: [id number]  Chief Complaint  Patient presents with   Follow-up    HPI Patient is in today for tuberculosis screening, stated  that he had a positive tuberculin test 6 days ago, no fever, chills, cough, shortness of breath, bloody sputum  He would also like STD testing, dysuria, penile discharge, penile rashes   Review of Systems  Constitutional:  Negative for activity change, appetite change, chills, diaphoresis, fatigue, fever and unexpected weight change.  HENT:  Negative for congestion, dental problem, drooling and ear discharge.   Eyes:  Negative for pain, discharge, redness and itching.  Respiratory:  Negative for apnea, cough, choking, chest tightness, shortness of breath and wheezing.   Cardiovascular: Negative.  Negative for chest pain, palpitations and leg swelling.  Gastrointestinal:  Negative for abdominal distention, abdominal pain, anal bleeding, blood in stool, constipation, diarrhea and vomiting.  Endocrine: Negative for polydipsia, polyphagia and polyuria.  Genitourinary:  Negative for difficulty urinating, flank pain, frequency and genital sores.  Musculoskeletal: Negative.  Negative for arthralgias, back pain, gait problem and joint swelling.  Skin:  Positive for rash. Negative for color change and pallor.  Neurological:  Negative for dizziness, facial asymmetry, light-headedness, numbness and headaches.  Psychiatric/Behavioral:  Negative for agitation, behavioral problems, confusion, hallucinations, self-injury, sleep disturbance and suicidal ideas.         Objective:    BP 135/71   Pulse (!) 53   Temp (!) 97 F (36.1 C)   Wt 230 lb (104.3 kg)   SpO2 99%   BMI 32.08 kg/m    Physical Exam Vitals and nursing note reviewed.  Constitutional:      General: He is not in acute distress.    Appearance: Normal appearance. He is obese. He is not  ill-appearing, toxic-appearing or diaphoretic.  HENT:     Mouth/Throat:     Mouth: Mucous membranes are moist.     Pharynx: Oropharynx is clear. No oropharyngeal exudate or posterior oropharyngeal erythema.  Eyes:     General: No scleral icterus.       Right eye: No discharge.        Left eye: No discharge.     Extraocular Movements: Extraocular movements intact.     Conjunctiva/sclera: Conjunctivae normal.  Cardiovascular:     Rate and Rhythm: Normal rate and regular rhythm.     Pulses: Normal pulses.     Heart sounds: Normal heart sounds. No murmur heard.    No friction rub. No gallop.  Pulmonary:     Effort: Pulmonary effort is normal. No respiratory distress.     Breath sounds: Normal breath sounds. No stridor. No wheezing, rhonchi or rales.  Chest:     Chest wall: No tenderness.  Abdominal:     General: There is no distension.     Palpations: Abdomen is soft.     Tenderness: There is no abdominal tenderness. There is no right CVA tenderness, left CVA tenderness or guarding.  Musculoskeletal:        General: No swelling, tenderness, deformity or signs of injury.     Right lower leg: No edema.     Left lower leg: No edema.  Skin:    General: Skin is warm and dry.     Capillary Refill: Capillary refill takes less than 2 seconds.     Coloration: Skin is not jaundiced or  pale.     Findings: Rash present. No bruising, erythema or lesion.     Comments: A wheal noted on the right forearm   Neurological:     Mental Status: He is alert and oriented to person, place, and time.     Motor: No weakness.     Coordination: Coordination normal.     Gait: Gait normal.  Psychiatric:        Mood and Affect: Mood normal.        Behavior: Behavior normal.        Thought Content: Thought content normal.        Judgment: Judgment normal.     No results found for any visits on 01/09/23.      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   No follow-ups on file.  Donell Beers, FNP

## 2023-01-09 NOTE — Patient Instructions (Signed)

## 2023-01-10 ENCOUNTER — Other Ambulatory Visit: Payer: Self-pay | Admitting: Infectious Diseases

## 2023-01-10 ENCOUNTER — Other Ambulatory Visit: Payer: Self-pay | Admitting: Pharmacist

## 2023-01-10 ENCOUNTER — Other Ambulatory Visit (HOSPITAL_COMMUNITY): Payer: Self-pay

## 2023-01-10 DIAGNOSIS — Z7252 High risk homosexual behavior: Secondary | ICD-10-CM

## 2023-01-11 ENCOUNTER — Other Ambulatory Visit (HOSPITAL_COMMUNITY): Payer: Self-pay

## 2023-01-11 NOTE — Progress Notes (Deleted)
HPI: Walter Howell is a 37 y.o. male who presents to the [facility] pharmacy clinic for HIV PrEP follow-up.  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   Exposure to gonorrhea 08/21/2022   Annual physical exam 04/10/2022   High risk homosexual behavior 04/10/2022   Marijuana user 04/10/2022   Blurry vision, bilateral 04/10/2022   Hyperkalemia 01/10/2022   Chronic right shoulder pain 01/08/2022   High risk bisexual behavior 01/08/2022   Mild intermittent asthma without complication 09/04/2019   Obesity with serious comorbidity 09/04/2019   Benign paroxysmal positional vertigo 07/23/2017   TMJ (temporomandibular joint disorder) 07/23/2017   Gastroesophageal reflux disease without esophagitis 07/23/2017   Chest pain 01/23/2017   History of herniated intervertebral disc 12/25/2016   Palpitations 12/25/2016   Anxiety and depression 12/06/2015   Chronic bilateral low back pain without sciatica 12/06/2015   Essential hypertension 07/08/2013    Patient's Medications  New Prescriptions   No medications on file  Previous Medications   ACETAMINOPHEN (TYLENOL) 500 MG TABLET    Take 1,000 mg by mouth every 6 (six) hours as needed for mild pain, moderate pain, fever or headache.   ALBUTEROL (VENTOLIN HFA) 108 (90 BASE) MCG/ACT INHALER    TAKE 2 PUFFS BY MOUTH EVERY 6 HOURS AS NEEDED FOR WHEEZE OR SHORTNESS OF BREATH   BUDESONIDE 90 MCG/ACT INHALER    Inhale 1 puff into the lungs 2 (two) times daily.   CETIRIZINE (ZYRTEC) 10 MG TABLET    Take 1 tablet (10 mg total) by mouth daily.   CYCLOBENZAPRINE (FLEXERIL) 5 MG TABLET    Take 1 tablet (5 mg total) by mouth 3 (three) times daily as needed for muscle spasms.   EMTRICITABINE-TENOFOVIR (TRUVADA) 200-300 MG TABLET    Take 1 tablet by mouth daily.   LIDOCAINE (LIDODERM) 5 %    Place 1 patch onto the skin daily. Remove & Discard patch within 12 hours or as directed  by MD   LIDOCAINE HCL-BENZYL ALCOHOL (SALONPAS LIDOCAINE PLUS) 4-10 % CREA    Apply 1 application. topically every 8 (eight) hours.   LOSARTAN-HYDROCHLOROTHIAZIDE (HYZAAR) 100-25 MG TABLET    Take 0.5 tablets by mouth daily.   MECLIZINE (ANTIVERT) 25 MG TABLET    Take 1 tablet (25 mg total) by mouth 3 (three) times daily as needed for dizziness.   MULTIPLE VITAMIN (MULTIVITAMIN WITH MINERALS) TABS TABLET    Take 1 tablet by mouth daily.   OMEPRAZOLE (PRILOSEC) 20 MG CAPSULE    TAKE 1 CAPSULE BY MOUTH EVERY DAY   ONDANSETRON (ZOFRAN) 4 MG TABLET    Take 1 tablet (4 mg total) by mouth every 8 (eight) hours as needed for nausea or vomiting.  Modified Medications   No medications on file  Discontinued Medications   No medications on file       07/10/2022    3:12 PM  [facility] HIV PREP FLOWSHEET RESULTS  Insurance Status Uninsured  Gender at birth Male  Gender identity cis-Male  Sex Partners Men only  # sex partners past 3-6 mos 4  Sex activity preferences Oral;Insertive and receptive  Condom use Yes  % condom use 70  Treated for STI? No  PrEP Eligibility Yes    Labs:  SCr: Lab Results  Component Value Date   CREATININE 0.94 10/10/2022   CREATININE 0.94 07/07/2022   CREATININE 0.84 01/09/2022   CREATININE  1.02 05/15/2021   CREATININE 0.87 12/30/2020   HIV Lab Results  Component Value Date   HIV Non Reactive 10/10/2022   HIV NON-REACTIVE 07/10/2022   HIV Non Reactive 04/10/2022   HIV Non Reactive 10/13/2021   HIV Non Reactive 05/15/2021   Hepatitis B Lab Results  Component Value Date   HEPBSAG Negative 10/10/2022   HEPBCAB Negative 10/10/2022   Hepatitis C No results found for: "HEPCAB", "HCVRNAPCRQN" Hepatitis A Lab Results  Component Value Date   HAV NON-REACTIVE 07/10/2022   RPR and STI Lab Results  Component Value Date   LABRPR NON-REACTIVE 07/10/2022   LABRPR Non Reactive 04/10/2022   LABRPR Non Reactive 01/09/2022   LABRPR NON REACTIVE 10/13/2021   LABRPR  Non Reactive 05/15/2021    STI Results GC CT  07/10/2022  3:34 PM Negative    Negative    Negative  Negative    Negative    Negative   10/13/2021  9:41 AM Negative  Negative   09/07/2019  4:32 PM Negative  Negative   07/29/2019  9:01 AM Negative  Negative   05/21/2019 11:08 AM Negative  Negative   02/12/2019  1:57 PM Negative  Negative   11/20/2018 12:00 AM Negative  Negative   05/12/2018 12:00 AM Negative  Negative   01/02/2018 12:00 AM Negative  Negative   03/28/2017 12:00 AM Negative  Negative   09/18/2016 12:00 AM Negative  Negative   12/06/2015 12:00 AM Negative  Negative   02/24/2015 12:00 AM Negative  Negative     Assessment: Walter Howell is here today to follow up for HIV PrEP. He was last seen in May and no showed his follow up appointment in August. Fill history shows that Truvada was filled on 10/08/22 and 11/28/22. Last tested for HIV and other STIs at his PCP's office in August - all negative. Explained that Truvada only really works like it should when taken every day. He was previously interested in Apretude but was having insurance issues with not being able to pay his premium. That has all resolved now and Apretude is covered under his pharmacy benefits with no copay.   Plan: ***  Jerelyn Trimarco L. Jannette Fogo, PharmD, BCIDP, AAHIVP, CPP Clinical Pharmacist Practitioner Infectious Diseases Clinical Pharmacist [facility] 01/11/2023, 10:04 AM

## 2023-01-14 ENCOUNTER — Other Ambulatory Visit: Payer: Self-pay

## 2023-01-14 ENCOUNTER — Other Ambulatory Visit (HOSPITAL_COMMUNITY): Payer: Self-pay

## 2023-01-14 ENCOUNTER — Ambulatory Visit (INDEPENDENT_AMBULATORY_CARE_PROVIDER_SITE_OTHER): Payer: Medicaid Other | Admitting: Pharmacist

## 2023-01-14 ENCOUNTER — Other Ambulatory Visit (HOSPITAL_COMMUNITY)
Admission: RE | Admit: 2023-01-14 | Discharge: 2023-01-14 | Disposition: A | Discharge status: Home / Self Care | Payer: Medicaid Other | Source: Ambulatory Visit | Attending: Infectious Disease | Admitting: Infectious Disease

## 2023-01-14 ENCOUNTER — Ambulatory Visit: Payer: Medicaid Other | Admitting: Pharmacist

## 2023-01-14 DIAGNOSIS — Z113 Encounter for screening for infections with a predominantly sexual mode of transmission: Secondary | ICD-10-CM

## 2023-01-14 DIAGNOSIS — Z2981 Encounter for HIV pre-exposure prophylaxis: Secondary | ICD-10-CM

## 2023-01-14 DIAGNOSIS — Z7252 High risk homosexual behavior: Secondary | ICD-10-CM

## 2023-01-14 MED ORDER — EMTRICITABINE-TENOFOVIR DF 200-300 MG PO TABS: 1.0000 | ORAL_TABLET | Freq: Every day | ORAL | 0 refills | Status: DC

## 2023-01-14 MED ORDER — APRETUDE 600 MG/3ML IM SUER
600.0000 mg | INTRAMUSCULAR | 1 refills | Status: DC
  Filled 2023-01-14: qty 3, 30d supply, fill #0
  Filled 2023-01-23: qty 3, 30d supply, fill #1

## 2023-01-14 MED ORDER — CABOTEGRAVIR ER 600 MG/3ML IM SUER
600.0000 mg | Freq: Once | INTRAMUSCULAR | Status: AC
  Administered 2023-01-14: 600 mg via INTRAMUSCULAR

## 2023-01-14 NOTE — Progress Notes (Signed)
Specialty Pharmacy Initial Fill Coordination Note  Walter Howell is a 37 y.o. male contacted today regarding refills of specialty medication(s) Cabotegravir   Patient requested Courier to Provider Office   Delivery date: 01/15/23   Verified address: [facility] [address] [place] [address]   Medication will be filled on 01/14/23.   Patient is aware of 0.00 copayment.

## 2023-01-14 NOTE — Progress Notes (Signed)
HPI: Walter Howell is a 37 y.o. male who presents to the [facility] pharmacy clinic for HIV PrEP follow-up.  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   Exposure to gonorrhea 08/21/2022   Annual physical exam 04/10/2022   High risk homosexual behavior 04/10/2022   Marijuana user 04/10/2022   Blurry vision, bilateral 04/10/2022   Hyperkalemia 01/10/2022   Chronic right shoulder pain 01/08/2022   High risk bisexual behavior 01/08/2022   Mild intermittent asthma without complication 09/04/2019   Obesity with serious comorbidity 09/04/2019   Benign paroxysmal positional vertigo 07/23/2017   TMJ (temporomandibular joint disorder) 07/23/2017   Gastroesophageal reflux disease without esophagitis 07/23/2017   Chest pain 01/23/2017   History of herniated intervertebral disc 12/25/2016   Palpitations 12/25/2016   Anxiety and depression 12/06/2015   Chronic bilateral low back pain without sciatica 12/06/2015   Essential hypertension 07/08/2013    Patient's Medications  New Prescriptions   No medications on file  Previous Medications   ACETAMINOPHEN (TYLENOL) 500 MG TABLET    Take 1,000 mg by mouth every 6 (six) hours as needed for mild pain, moderate pain, fever or headache.   ALBUTEROL (VENTOLIN HFA) 108 (90 BASE) MCG/ACT INHALER    TAKE 2 PUFFS BY MOUTH EVERY 6 HOURS AS NEEDED FOR WHEEZE OR SHORTNESS OF BREATH   BUDESONIDE 90 MCG/ACT INHALER    Inhale 1 puff into the lungs 2 (two) times daily.   CETIRIZINE (ZYRTEC) 10 MG TABLET    Take 1 tablet (10 mg total) by mouth daily.   CYCLOBENZAPRINE (FLEXERIL) 5 MG TABLET    Take 1 tablet (5 mg total) by mouth 3 (three) times daily as needed for muscle spasms.   EMTRICITABINE-TENOFOVIR (TRUVADA) 200-300 MG TABLET    Take 1 tablet by mouth daily.   LIDOCAINE (LIDODERM) 5 %    Place 1 patch onto the skin daily. Remove & Discard patch within 12 hours or as directed  by MD   LIDOCAINE HCL-BENZYL ALCOHOL (SALONPAS LIDOCAINE PLUS) 4-10 % CREA    Apply 1 application. topically every 8 (eight) hours.   LOSARTAN-HYDROCHLOROTHIAZIDE (HYZAAR) 100-25 MG TABLET    Take 0.5 tablets by mouth daily.   MECLIZINE (ANTIVERT) 25 MG TABLET    Take 1 tablet (25 mg total) by mouth 3 (three) times daily as needed for dizziness.   MULTIPLE VITAMIN (MULTIVITAMIN WITH MINERALS) TABS TABLET    Take 1 tablet by mouth daily.   OMEPRAZOLE (PRILOSEC) 20 MG CAPSULE    TAKE 1 CAPSULE BY MOUTH EVERY DAY   ONDANSETRON (ZOFRAN) 4 MG TABLET    Take 1 tablet (4 mg total) by mouth every 8 (eight) hours as needed for nausea or vomiting.  Modified Medications   No medications on file  Discontinued Medications   No medications on file       07/10/2022    3:12 PM  CH[facility] HIV PREP FLOWSHEET RESULTS  Insurance Status Uninsured  Gender at birth Male  Gender identity cis-Male  Sex Partners Men only  # sex partners past 3-6 mos 4  Sex activity preferences Oral;Insertive and receptive  Condom use Yes  % condom use 70  Treated for STI? No  PrEP Eligibility Yes    Labs:  SCr: Lab Results  Component Value Date   CREATININE 0.94 10/10/2022   CREATININE 0.94 07/07/2022   CREATININE 0.84 01/09/2022   CREATININE  1.02 05/15/2021   CREATININE 0.87 12/30/2020   HIV Lab Results  Component Value Date   HIV Non Reactive 10/10/2022   HIV NON-REACTIVE 07/10/2022   HIV Non Reactive 04/10/2022   HIV Non Reactive 10/13/2021   HIV Non Reactive 05/15/2021   Hepatitis B Lab Results  Component Value Date   HEPBSAG Negative 10/10/2022   HEPBCAB Negative 10/10/2022   Hepatitis C No results found for: "HEPCAB", "HCVRNAPCRQN" Hepatitis A Lab Results  Component Value Date   HAV NON-REACTIVE 07/10/2022   RPR and STI Lab Results  Component Value Date   LABRPR NON-REACTIVE 07/10/2022   LABRPR Non Reactive 04/10/2022   LABRPR Non Reactive 01/09/2022   LABRPR NON REACTIVE 10/13/2021   LABRPR  Non Reactive 05/15/2021    STI Results GC CT  07/10/2022  3:34 PM Negative    Negative    Negative  Negative    Negative    Negative   10/13/2021  9:41 AM Negative  Negative   09/07/2019  4:32 PM Negative  Negative   07/29/2019  9:01 AM Negative  Negative   05/21/2019 11:08 AM Negative  Negative   02/12/2019  1:57 PM Negative  Negative   11/20/2018 12:00 AM Negative  Negative   05/12/2018 12:00 AM Negative  Negative   01/02/2018 12:00 AM Negative  Negative   03/28/2017 12:00 AM Negative  Negative   09/18/2016 12:00 AM Negative  Negative   12/06/2015 12:00 AM Negative  Negative   02/24/2015 12:00 AM Negative  Negative     Assessment: Walter Howell is here today to follow up for HIV PrEP. He was last seen in May and no showed his follow up appointment in August. Fill history shows that Truvada was filled on 10/08/22 and 11/28/22. Last tested for HIV and other STIs at his PCP's office in August - all negative. Explained that Truvada only really works like it should when taken every day. He was previously interested in Apretude but was having insurance issues with not being able to pay his premium. That has all resolved now and Apretude is covered under his pharmacy benefits with no copay. He would like to proceed with starting today.   Counseled that Apretude is one intramuscular injection in the gluteal muscle for each visit. Explained that the second injection is 30 days after the initial injection then every 2 months thereafter. Discussed follow up appointments moving forward. Screened for acute HIV symptoms such as fatigue, muscle aches, rash, sore throat, lymphadenopathy, headache, night sweats, nausea/vomiting/diarrhea, and fever. Denies any symptoms.  No known exposures to any STIs since last visit. Agrees to full STI testing today with RPR and oral/urine/rectal cytologies.   Explained that showing up to injection appointments is very important and warned that if appointments are missed,  protection will be minimal and the risk of acquiring HIV becomes much higher. Counseled on possible side effects associated with the injections such as injection site pain, which is usually mild to moderate in nature, injection site nodules, and injection site reactions. Asked to call the clinic or send me a mychart message if they experience any issues. Advised that he can take Motrin or Tylenol for injection site pain if needed. He may also pre-treat with Motrin or Tylenol 30-45 minutes before scheduled appointments.   Per [employer] guidelines, a rapid HIV test should be drawn prior to Apretude administration. Due to state shortage of rapid HIV tests, this is temporarily unable to be done. Per decision from [facility] physicians, we will  proceed with Apretude administration at this time without a negative rapid HIV test beforehand. HIV RNA was collected today and is in process.  Administered cabotegravir 600mg /46mL in right upper outer quadrant of the gluteal muscle. Monitored patient for 10 minutes after injection. Injection was tolerated well without issue. Counseled to stop taking Truvada after today's dose and to call with any issues that may arise. Will make follow up appointments for second initiation injection in 30 days and then maintenance injections every 2 months thereafter.   He states that he has already been vaccinated against the flu and HPV. He does not have any documented immunity to Hepatitis A and B. Will revisit at next appointment.  Plan: - Stop Truvada after today's dose - HIV RNA, RPR, and urine/rectal/pharyngeal cytologies for GC/chlamydia  - First Apretude injection administered - Second initiation injection scheduled for 02/06/23 - Call with any issues or questions  Chelci Wintermute L. Risa Auman, PharmD, BCIDP, AAHIVP, CPP Clinical Pharmacist Practitioner Infectious Diseases Clinical Pharmacist [facility]

## 2023-01-15 ENCOUNTER — Telehealth: Payer: Self-pay

## 2023-01-15 LAB — CYTOLOGY, (ORAL, ANAL, URETHRAL) ANCILLARY ONLY
Chlamydia: NEGATIVE
Chlamydia: NEGATIVE
Comment: NEGATIVE
Comment: NEGATIVE
Comment: NORMAL
Comment: NORMAL
Neisseria Gonorrhea: NEGATIVE
Neisseria Gonorrhea: NEGATIVE

## 2023-01-15 LAB — URINE CYTOLOGY ANCILLARY ONLY
Chlamydia: NEGATIVE
Comment: NEGATIVE
Comment: NORMAL
Neisseria Gonorrhea: NEGATIVE

## 2023-01-15 NOTE — Telephone Encounter (Signed)
[facility] Patient Advocate Encounter  Patient's medications Apretude have been couriered to [facility] from [facility] and will be administered at the patients appointment on 01/14/23.  [name], CPhT Specialty Pharmacy Patient [facility] for Infectious Disease Phone: [phone] Fax:  [phone]

## 2023-01-17 LAB — RPR: RPR Ser Ql: NONREACTIVE

## 2023-01-17 LAB — HIV-1 RNA QUANT-NO REFLEX-BLD
HIV 1 RNA Quant: NOT DETECTED {copies}/mL
HIV-1 RNA Quant, Log: NOT DETECTED {Log_copies}/mL

## 2023-01-23 ENCOUNTER — Other Ambulatory Visit (HOSPITAL_COMMUNITY): Payer: Self-pay

## 2023-01-23 ENCOUNTER — Other Ambulatory Visit: Payer: Self-pay

## 2023-01-23 NOTE — Progress Notes (Signed)
Specialty Pharmacy Refill Coordination Note  Trystyn Armond is a 37 y.o. male assessed today regarding refills of clinic administered specialty medication(s) Cabotegravir   Clinic requested Courier to Provider Office   Delivery date: 02/06/23   Verified address: [address] [place] [address]   Medication will be filled on 02/05/23.

## 2023-02-05 ENCOUNTER — Other Ambulatory Visit: Payer: Self-pay

## 2023-02-05 NOTE — Progress Notes (Unsigned)
HPI: Walter Howell is a 37 y.o. male who presents to the [facility] pharmacy clinic for Apretude administration and HIV PrEP follow up.  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   Exposure to gonorrhea 08/21/2022   Annual physical exam 04/10/2022   High risk homosexual behavior 04/10/2022   Marijuana user 04/10/2022   Blurry vision, bilateral 04/10/2022   Hyperkalemia 01/10/2022   Chronic right shoulder pain 01/08/2022   High risk bisexual behavior 01/08/2022   Mild intermittent asthma without complication 09/04/2019   Obesity with serious comorbidity 09/04/2019   Benign paroxysmal positional vertigo 07/23/2017   TMJ (temporomandibular joint disorder) 07/23/2017   Gastroesophageal reflux disease without esophagitis 07/23/2017   Chest pain 01/23/2017   History of herniated intervertebral disc 12/25/2016   Palpitations 12/25/2016   Anxiety and depression 12/06/2015   Chronic bilateral low back pain without sciatica 12/06/2015   Essential hypertension 07/08/2013    Patient's Medications  New Prescriptions   No medications on file  Previous Medications   ACETAMINOPHEN (TYLENOL) 500 MG TABLET    Take 1,000 mg by mouth every 6 (six) hours as needed for mild pain, moderate pain, fever or headache.   ALBUTEROL (VENTOLIN HFA) 108 (90 BASE) MCG/ACT INHALER    TAKE 2 PUFFS BY MOUTH EVERY 6 HOURS AS NEEDED FOR WHEEZE OR SHORTNESS OF BREATH   BUDESONIDE 90 MCG/ACT INHALER    Inhale 1 puff into the lungs 2 (two) times daily.   CABOTEGRAVIR ER (APRETUDE) 600 MG/3ML INJECTION    Inject 3 mLs (600 mg total) into the muscle every 30 (thirty) days.   CETIRIZINE (ZYRTEC) 10 MG TABLET    Take 1 tablet (10 mg total) by mouth daily.   CYCLOBENZAPRINE (FLEXERIL) 5 MG TABLET    Take 1 tablet (5 mg total) by mouth 3 (three) times daily as needed for muscle spasms.   EMTRICITABINE-TENOFOVIR (TRUVADA) 200-300 MG TABLET     Take 1 tablet by mouth daily.   LIDOCAINE (LIDODERM) 5 %    Place 1 patch onto the skin daily. Remove & Discard patch within 12 hours or as directed by MD   LIDOCAINE HCL-BENZYL ALCOHOL (SALONPAS LIDOCAINE PLUS) 4-10 % CREA    Apply 1 application. topically every 8 (eight) hours.   LOSARTAN-HYDROCHLOROTHIAZIDE (HYZAAR) 100-25 MG TABLET    Take 0.5 tablets by mouth daily.   MECLIZINE (ANTIVERT) 25 MG TABLET    Take 1 tablet (25 mg total) by mouth 3 (three) times daily as needed for dizziness.   MULTIPLE VITAMIN (MULTIVITAMIN WITH MINERALS) TABS TABLET    Take 1 tablet by mouth daily.   OMEPRAZOLE (PRILOSEC) 20 MG CAPSULE    TAKE 1 CAPSULE BY MOUTH EVERY DAY   ONDANSETRON (ZOFRAN) 4 MG TABLET    Take 1 tablet (4 mg total) by mouth every 8 (eight) hours as needed for nausea or vomiting.  Modified Medications   No medications on file  Discontinued Medications   No medications on file    Allergies: Allergies  Allergen Reactions   Prednisone     "makes him feel weird"    Labs: Lab Results  Component Value Date   HIV1RNAQUANT Not Detected 01/14/2023    RPR and STI Lab Results  Component Value Date   LABRPR NON-REACTIVE 01/14/2023   LABRPR NON-REACTIVE 07/10/2022   LABRPR Non Reactive 04/10/2022   LABRPR Non Reactive 01/09/2022  LABRPR NON REACTIVE 10/13/2021    STI Results GC CT  01/14/2023  1:59 PM Negative    Negative    Negative  Negative    Negative    Negative   07/10/2022  3:34 PM Negative    Negative    Negative  Negative    Negative    Negative   10/13/2021  9:41 AM Negative  Negative   09/07/2019  4:32 PM Negative  Negative   07/29/2019  9:01 AM Negative  Negative   05/21/2019 11:08 AM Negative  Negative   02/12/2019  1:57 PM Negative  Negative   11/20/2018 12:00 AM Negative  Negative   05/12/2018 12:00 AM Negative  Negative   01/02/2018 12:00 AM Negative  Negative   03/28/2017 12:00 AM Negative  Negative   09/18/2016 12:00 AM Negative  Negative    12/06/2015 12:00 AM Negative  Negative   02/24/2015 12:00 AM Negative  Negative     Hepatitis B Lab Results  Component Value Date   HEPBSAG Negative 10/10/2022   HEPBCAB Negative 10/10/2022   Hepatitis C No results found for: "HEPCAB", "HCVRNAPCRQN" Hepatitis A Lab Results  Component Value Date   HAV NON-REACTIVE 07/10/2022   Lipids: Lab Results  Component Value Date   CHOL 174 04/10/2022   TRIG 97 04/10/2022   HDL 42 04/10/2022   CHOLHDL 4.1 04/10/2022   LDLCALC 114 (H) 04/10/2022    TARGET DATE: The 18th  Assessment: Walter Howell presents today for his second Apretude injection and to follow up for HIV PrEP. No issues with initial injection. Denies any symptoms of acute HIV. Last STI screening was on 01/14/23 and was negative. No known exposures to any STIs since last visit. ***Agrees to full STI testing today with RPR and oral/urine/rectal cytologies.   Per [employer] guidelines, a rapid HIV test should be drawn prior to Apretude administration. Due to state shortage of rapid HIV tests, this is temporarily unable to be done. Per decision from [facility] physicians, we will proceed with Apretude administration at this time without a negative rapid HIV test beforehand. HIV RNA was collected today and is in process.  Administered cabotegravir 600mg /78mL in *** upper outer quadrant of the gluteal muscle. Will see him back in 2 months for injection, labs, and HIV PrEP follow up.  Plan: - Apretude injection administered - HIV RNA today - Next injection, labs, and PrEP follow up appointment scheduled for *** - Call with any issues or questions  Mali Eppard L. Lillyanne Bradburn, PharmD, BCIDP, AAHIVP, CPP Clinical Pharmacist Practitioner Infectious Diseases Clinical Pharmacist [facility]

## 2023-02-06 ENCOUNTER — Telehealth: Payer: Self-pay

## 2023-02-06 ENCOUNTER — Ambulatory Visit: Payer: Medicaid Other | Admitting: Pharmacist

## 2023-02-06 DIAGNOSIS — Z2981 Encounter for HIV pre-exposure prophylaxis: Secondary | ICD-10-CM

## 2023-02-06 NOTE — Telephone Encounter (Addendum)
[facility] Patient Advocate Encounter  Patient's medications APRETUDE have been couriered to [facility] from [facility] and will be administered at the patients appointment on 02/06/23.  Walter Howell, CPhT Specialty Pharmacy Patient [facility] for Infectious Disease Phone: [phone] Fax:  [phone]

## 2023-02-15 ENCOUNTER — Telehealth: Payer: Self-pay | Admitting: Nurse Practitioner

## 2023-02-15 NOTE — Telephone Encounter (Signed)
Tried to call back. No answer and unable to LVM

## 2023-02-15 NOTE — Telephone Encounter (Signed)
Copied from CRM [id number]. Topic: General - Other >> Feb 15, 2023 12:33 PM Fonda Kinder J wrote: Reason for CRM: [employer] called in to verify a doctor's excuse given to her by the pt. She is requesting a callback to discuss [phone]

## 2023-03-22 ENCOUNTER — Other Ambulatory Visit: Payer: Self-pay

## 2023-03-22 NOTE — Progress Notes (Signed)
Patient missed several appointments

## 2023-04-07 ENCOUNTER — Emergency Department (HOSPITAL_BASED_OUTPATIENT_CLINIC_OR_DEPARTMENT_OTHER)
Admission: EM | Admit: 2023-04-07 | Discharge: 2023-04-07 | Disposition: A | Discharge status: Home / Self Care | Payer: Medicaid Other | Attending: Emergency Medicine | Admitting: Emergency Medicine

## 2023-04-07 ENCOUNTER — Other Ambulatory Visit: Payer: Self-pay

## 2023-04-07 ENCOUNTER — Encounter (HOSPITAL_BASED_OUTPATIENT_CLINIC_OR_DEPARTMENT_OTHER): Payer: Self-pay | Admitting: Emergency Medicine

## 2023-04-07 DIAGNOSIS — J45909 Unspecified asthma, uncomplicated: Secondary | ICD-10-CM | POA: Diagnosis not present

## 2023-04-07 DIAGNOSIS — R55 Syncope and collapse: Secondary | ICD-10-CM | POA: Diagnosis present

## 2023-04-07 DIAGNOSIS — I10 Essential (primary) hypertension: Secondary | ICD-10-CM | POA: Insufficient documentation

## 2023-04-07 DIAGNOSIS — Z79899 Other long term (current) drug therapy: Secondary | ICD-10-CM | POA: Diagnosis not present

## 2023-04-07 LAB — COMPREHENSIVE METABOLIC PANEL
ALT: 22 U/L (ref 0–44)
AST: 21 U/L (ref 15–41)
Albumin: 4.3 g/dL (ref 3.5–5.0)
Alkaline Phosphatase: 35 U/L — ABNORMAL LOW (ref 38–126)
Anion gap: 9 (ref 5–15)
BUN: 13 mg/dL (ref 6–20)
CO2: 25 mmol/L (ref 22–32)
Calcium: 8.8 mg/dL — ABNORMAL LOW (ref 8.9–10.3)
Chloride: 104 mmol/L (ref 98–111)
Creatinine, Ser: 0.7 mg/dL (ref 0.61–1.24)
GFR, Estimated: >60 mL/min (ref >60)
Glucose, Bld: 111 mg/dL — ABNORMAL HIGH (ref 70–99)
Potassium: 3.4 mmol/L — ABNORMAL LOW (ref 3.5–5.1)
Sodium: 138 mmol/L (ref 135–145)
Total Bilirubin: 0.7 mg/dL (ref 0.0–1.2)
Total Protein: 7.2 g/dL (ref 6.5–8.1)

## 2023-04-07 LAB — CBC WITH DIFFERENTIAL/PLATELET
Abs Immature Granulocytes: 0.01 10*3/uL (ref 0.00–0.07)
Basophils Absolute: 0 10*3/uL (ref 0.0–0.1)
Basophils Relative: 0 %
Eosinophils Absolute: 0.2 10*3/uL (ref 0.0–0.5)
Eosinophils Relative: 3 %
HCT: 43.5 % (ref 39.0–52.0)
Hemoglobin: 15.6 g/dL (ref 13.0–17.0)
Immature Granulocytes: 0 %
Lymphocytes Relative: 24 %
Lymphs Abs: 1.2 10*3/uL (ref 0.7–4.0)
MCH: 32.4 pg (ref 26.0–34.0)
MCHC: 35.9 g/dL (ref 30.0–36.0)
MCV: 90.4 fL (ref 80.0–100.0)
Monocytes Absolute: 0.3 10*3/uL (ref 0.1–1.0)
Monocytes Relative: 6 %
Neutro Abs: 3.4 10*3/uL (ref 1.7–7.7)
Neutrophils Relative %: 67 %
Platelets: 195 10*3/uL (ref 150–400)
RBC: 4.81 MIL/uL (ref 4.22–5.81)
RDW: 13.2 % (ref 11.5–15.5)
WBC: 5.1 10*3/uL (ref 4.0–10.5)
nRBC: 0 % (ref 0.0–0.2)

## 2023-04-07 LAB — CBG MONITORING, ED: Glucose-Capillary: 91 mg/dL (ref 70–99)

## 2023-04-07 NOTE — Discharge Instructions (Signed)
 Continue losartan  as previously prescribed.  Drink plenty of fluids and get plenty of rest.  Follow-up with primary doctor and return to the emergency department if you experience any new and/or concerning issues.

## 2023-04-07 NOTE — ED Notes (Signed)
 ED Provider at bedside.

## 2023-04-07 NOTE — ED Provider Notes (Signed)
 [place] EMERGENCY DEPARTMENT AT [facility] Provider Note   CSN: [id number] Arrival date & time: 04/07/23  2148     History  Chief Complaint  Patient presents with   Loss of Consciousness    Walter Howell is a 38 y.o. male.  Patient is a 38 year old male with history of hypertension taking losartan , asthma.  Patient presenting today for evaluation of syncope.  Patient returned home from work this evening.  He was standing in the doorway smoking a cigarette when he suddenly became weak and lost consciousness.  He states he was unconscious for 1 to 2 minutes, then woke up and asked his roommate to call 911.  When EMS arrived, he was hypertensive and they recommended he come for evaluation.  Patient is now back to baseline and has no complaints.  He denies any bowel or bladder incontinence.  He denies having bitten his cheek or tongue.  The history is provided by the patient.       Home Medications Prior to Admission medications   Medication Sig Start Date End Date Taking? Authorizing Provider  acetaminophen  (TYLENOL ) 500 MG tablet Take 1,000 mg by mouth every 6 (six) hours as needed for mild pain, moderate pain, fever or headache.    [provider]  albuterol  (VENTOLIN  HFA) 108 (90 Base) MCG/ACT inhaler TAKE 2 PUFFS BY MOUTH EVERY 6 HOURS AS NEEDED FOR WHEEZE OR SHORTNESS OF BREATH 04/10/22   Paseda, Folashade R, FNP  Budesonide  90 MCG/ACT inhaler Inhale 1 puff into the lungs 2 (two) times daily. Patient not taking: Reported on 01/08/2022 07/27/20   Teresa Shelba SAUNDERS, NP  cabotegravir  ER (APRETUDE ) 600 MG/3ML injection Inject 3 mLs (600 mg total) into the muscle every 30 (thirty) days. 01/14/23   Kuppelweiser, Cassie L, RPH-CPP  cetirizine  (ZYRTEC ) 10 MG tablet Take 1 tablet (10 mg total) by mouth daily. 09/13/20   Myrna Camelia HERO, NP  cyclobenzaprine  (FLEXERIL ) 5 MG tablet Take 1 tablet (5 mg total) by mouth 3 (three) times daily as needed for muscle spasms.  09/26/22   Paseda, Folashade R, FNP  emtricitabine -tenofovir  (TRUVADA) 200-300 MG tablet Take 1 tablet by mouth daily. 01/14/23   Kuppelweiser, Cassie L, RPH-CPP  lidocaine  (LIDODERM ) 5 % Place 1 patch onto the skin daily. Remove & Discard patch within 12 hours or as directed by MD Patient not taking: Reported on 01/09/2023 05/15/21   Myrna Camelia HERO, NP  Lidocaine  HCl-Benzyl Alcohol (SALONPAS  LIDOCAINE  PLUS) 4-10 % CREA Apply 1 application. topically every 8 (eight) hours. Patient not taking: Reported on 01/08/2022 05/15/21   Myrna Camelia HERO, NP  losartan -hydrochlorothiazide  (HYZAAR) 100-25 MG tablet Take 0.5 tablets by mouth daily. 10/09/22   Paseda, Folashade R, FNP  meclizine  (ANTIVERT ) 25 MG tablet Take 1 tablet (25 mg total) by mouth 3 (three) times daily as needed for dizziness. 11/23/19   Stroud, Natalie M, FNP  Multiple Vitamin (MULTIVITAMIN WITH MINERALS) TABS tablet Take 1 tablet by mouth daily.    [provider]  omeprazole  (PRILOSEC) 20 MG capsule TAKE 1 CAPSULE BY MOUTH EVERY DAY 10/22/22   Paseda, Folashade R, FNP  ondansetron  (ZOFRAN ) 4 MG tablet Take 1 tablet (4 mg total) by mouth every 8 (eight) hours as needed for nausea or vomiting. 10/13/21   Lynwood Lenis, PA-C      Allergies    Prednisone     Review of Systems   Review of Systems  All other systems reviewed and are negative.   Physical Exam Updated Vital  Signs BP (!) 146/95   Pulse 65   Temp 98.2 F (36.8 C)   Resp 17   Ht 5' 11 (1.803 m)   Wt 97.5 kg   SpO2 98%   BMI 29.99 kg/m  Physical Exam Vitals and nursing note reviewed.  Constitutional:      General: He is not in acute distress.    Appearance: He is well-developed. He is not diaphoretic.  HENT:     Head: Normocephalic and atraumatic.  Eyes:     Extraocular Movements: Extraocular movements intact.     Pupils: Pupils are equal, round, and reactive to light.  Cardiovascular:     Rate and Rhythm: Normal rate and regular rhythm.     Heart  sounds: No murmur heard.    No friction rub.  Pulmonary:     Effort: Pulmonary effort is normal. No respiratory distress.     Breath sounds: Normal breath sounds. No wheezing or rales.  Abdominal:     General: Bowel sounds are normal. There is no distension.     Palpations: Abdomen is soft.     Tenderness: There is no abdominal tenderness.  Musculoskeletal:        General: Normal range of motion.     Cervical back: Normal range of motion and neck supple.  Skin:    General: Skin is warm and dry.  Neurological:     General: No focal deficit present.     Mental Status: He is alert and oriented to person, place, and time.     Cranial Nerves: No cranial nerve deficit.     Motor: No weakness.     Coordination: Coordination normal.     ED Results / Procedures / Treatments   Labs (all labs ordered are listed, but only abnormal results are displayed) Labs Reviewed  COMPREHENSIVE METABOLIC PANEL - Abnormal; Notable for the following components:      Result Value   Potassium 3.4 (*)    Glucose, Bld 111 (*)    Calcium 8.8 (*)    Alkaline Phosphatase 35 (*)    All other components within normal limits  CBC WITH DIFFERENTIAL/PLATELET  CBG MONITORING, ED    EKG EKG Interpretation Date/Time:  Sunday April 07 2023 22:13:17 EST Ventricular Rate:  66 PR Interval:  181 QRS Duration:  90 QT Interval:  360 QTC Calculation: 378 R Axis:   69  Text Interpretation: Sinus rhythm Borderline T abnormalities, inferior leads No significant change since 10/28/2020 Confirmed by Geroldine Berg ([id number]) on 04/07/2023 11:21:55 PM  Radiology No results found.  Procedures Procedures    Medications Ordered in ED Medications - No data to display  ED Course/ Medical Decision Making/ A&P  Patient is a 38 year old male presenting with syncope as described in the HPI.  Patient arrives here with stable vital signs and is afebrile.  His physical examination is unremarkable.  Laboratory studies  obtained including CBC and basic metabolic panel, both of which are unremarkable.  Patient is neurologically intact and workup is unremarkable.  His EKG is unchanged.  Because of his syncope unclear, but sounds vasovagal.  Patient has been observed for nearly 2 hours and has had no ectopy and blood pressure has improved.  I feel as though he can safely be discharged.  To follow-up as needed.  Final Clinical Impression(s) / ED Diagnoses Final diagnoses:  None    Rx / DC Orders ED Discharge Orders     None  Geroldine Berg, MD 04/07/23 [phone]

## 2023-04-07 NOTE — ED Triage Notes (Signed)
 Patient reports "passing out" approx pta. Reports calling EMS, however refused transport. Patient states he was advised to come to ED per paramedics d/t elevated blood pressure on scene.  Patient admits to non compliance with BP meds

## 2023-04-09 ENCOUNTER — Telehealth: Payer: Self-pay | Admitting: Nurse Practitioner

## 2023-04-09 ENCOUNTER — Telehealth: Payer: Self-pay

## 2023-04-09 NOTE — Telephone Encounter (Signed)
Copied from CRM [id number]. Topic: General - Other >> Apr 09, 2023 12:44 PM Whitney O wrote: Reason for CRM: patient was returning a call for a charice [place] look like when going to phone encounter. Tried calling [place] no response please give patient a call back . Patient wants me to  schedule a appointment too . Doing that now

## 2023-04-09 NOTE — Transitions of Care (Post Inpatient/ED Visit) (Signed)
   04/09/2023  Name: Walter Howell MRN: [id number] DOB: Feb 16, 1986  Today's TOC FU Call Status: Today's TOC FU Call Status:: Unsuccessful Call (1st Attempt) Unsuccessful Call (1st Attempt) Date: 04/09/23  Attempted to reach the patient regarding the most recent Inpatient/ED visit.  Follow Up Plan: Additional outreach attempts will be made to reach the patient to complete the Transitions of Care (Post Inpatient/ED visit) call.   Signature  [employer], [place]

## 2023-04-10 ENCOUNTER — Ambulatory Visit: Payer: Self-pay | Admitting: Nurse Practitioner

## 2023-04-11 ENCOUNTER — Telehealth: Payer: Self-pay

## 2023-04-11 NOTE — Transitions of Care (Post Inpatient/ED Visit) (Signed)
   04/11/2023  Name: Walter Howell MRN: [id number] DOB: [birth year]  Today's TOC FU Call Status: Today's TOC FU Call Status:: Unsuccessful Call (2nd Attempt) Unsuccessful Call (2nd Attempt) Date: 04/11/23  Attempted to reach the patient regarding the most recent Inpatient/ED visit.  Follow Up Plan: Additional outreach attempts will be made to reach the patient to complete the Transitions of Care (Post Inpatient/ED visit) call.   Signature Renelda Loma RMA

## 2023-04-15 ENCOUNTER — Ambulatory Visit: Payer: Self-pay | Admitting: Nurse Practitioner

## 2023-04-17 ENCOUNTER — Inpatient Hospital Stay: Payer: Self-pay | Admitting: Nurse Practitioner

## 2023-04-24 NOTE — Progress Notes (Signed)
 HPI: Walter Howell is a 38 y.o. male who presents to the [facility] pharmacy clinic for HIV PrEP follow-up.  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   Exposure to gonorrhea 08/21/2022   Annual physical exam 04/10/2022   High risk homosexual behavior 04/10/2022   Marijuana user 04/10/2022   Blurry vision, bilateral 04/10/2022   Hyperkalemia 01/10/2022   Chronic right shoulder pain 01/08/2022   High risk bisexual behavior 01/08/2022   Mild intermittent asthma without complication 09/04/2019   Obesity with serious comorbidity 09/04/2019   Benign paroxysmal positional vertigo 07/23/2017   TMJ (temporomandibular joint disorder) 07/23/2017   Gastroesophageal reflux disease without esophagitis 07/23/2017   Chest pain 01/23/2017   History of herniated intervertebral disc 12/25/2016   Palpitations 12/25/2016   Anxiety and depression 12/06/2015   Chronic bilateral low back pain without sciatica 12/06/2015   Essential hypertension 07/08/2013    Patient's Medications  New Prescriptions   No medications on file  Previous Medications   ACETAMINOPHEN (TYLENOL) 500 MG TABLET    Take 1,000 mg by mouth every 6 (six) hours as needed for mild pain, moderate pain, fever or headache.   ALBUTEROL (VENTOLIN HFA) 108 (90 BASE) MCG/ACT INHALER    TAKE 2 PUFFS BY MOUTH EVERY 6 HOURS AS NEEDED FOR WHEEZE OR SHORTNESS OF BREATH   BUDESONIDE 90 MCG/ACT INHALER    Inhale 1 puff into the lungs 2 (two) times daily.   CABOTEGRAVIR ER (APRETUDE) 600 MG/3ML INJECTION    Inject 3 mLs (600 mg total) into the muscle every 30 (thirty) days.   CETIRIZINE (ZYRTEC) 10 MG TABLET    Take 1 tablet (10 mg total) by mouth daily.   CYCLOBENZAPRINE (FLEXERIL) 5 MG TABLET    Take 1 tablet (5 mg total) by mouth 3 (three) times daily as needed for muscle spasms.   EMTRICITABINE-TENOFOVIR (TRUVADA) 200-300 MG TABLET    Take 1 tablet by mouth  daily.   LIDOCAINE (LIDODERM) 5 %    Place 1 patch onto the skin daily. Remove & Discard patch within 12 hours or as directed by MD   LIDOCAINE HCL-BENZYL ALCOHOL (SALONPAS LIDOCAINE PLUS) 4-10 % CREA    Apply 1 application. topically every 8 (eight) hours.   LOSARTAN-HYDROCHLOROTHIAZIDE (HYZAAR) 100-25 MG TABLET    Take 0.5 tablets by mouth daily.   MECLIZINE (ANTIVERT) 25 MG TABLET    Take 1 tablet (25 mg total) by mouth 3 (three) times daily as needed for dizziness.   MULTIPLE VITAMIN (MULTIVITAMIN WITH MINERALS) TABS TABLET    Take 1 tablet by mouth daily.   OMEPRAZOLE (PRILOSEC) 20 MG CAPSULE    TAKE 1 CAPSULE BY MOUTH EVERY DAY   ONDANSETRON (ZOFRAN) 4 MG TABLET    Take 1 tablet (4 mg total) by mouth every 8 (eight) hours as needed for nausea or vomiting.  Modified Medications   No medications on file  Discontinued Medications   No medications on file       07/10/2022    3:12 PM  [facility] HIV PREP FLOWSHEET RESULTS  Insurance Status Uninsured  Gender at birth Male  Gender identity cis-Male  Sex Partners Men only  # sex partners past 3-6 mos 4  Sex activity preferences Oral;Insertive and receptive  Condom use Yes  % condom use 70  Treated for STI? No  PrEP Eligibility Yes    Labs:  SCr:  Lab Results  Component Value Date   CREATININE 0.70 04/07/2023   CREATININE 0.94 10/10/2022   CREATININE 0.94 07/07/2022   CREATININE 0.84 01/09/2022   CREATININE 1.02 05/15/2021   HIV Lab Results  Component Value Date   HIV Non Reactive 10/10/2022   HIV NON-REACTIVE 07/10/2022   HIV Non Reactive 04/10/2022   HIV Non Reactive 10/13/2021   HIV Non Reactive 05/15/2021   Hepatitis B Lab Results  Component Value Date   HEPBSAG Negative 10/10/2022   HEPBCAB Negative 10/10/2022   Hepatitis C No results found for: "HEPCAB", "HCVRNAPCRQN" Hepatitis A Lab Results  Component Value Date   HAV NON-REACTIVE 07/10/2022   RPR and STI Lab Results  Component Value Date   LABRPR  NON-REACTIVE 01/14/2023   LABRPR NON-REACTIVE 07/10/2022   LABRPR Non Reactive 04/10/2022   LABRPR Non Reactive 01/09/2022   LABRPR NON REACTIVE 10/13/2021    STI Results GC CT  01/14/2023  1:59 PM Negative    Negative    Negative  Negative    Negative    Negative   07/10/2022  3:34 PM Negative    Negative    Negative  Negative    Negative    Negative   10/13/2021  9:41 AM Negative  Negative   09/07/2019  4:32 PM Negative  Negative   07/29/2019  9:01 AM Negative  Negative   05/21/2019 11:08 AM Negative  Negative   02/12/2019  1:57 PM Negative  Negative   11/20/2018 12:00 AM Negative  Negative   05/12/2018 12:00 AM Negative  Negative   01/02/2018 12:00 AM Negative  Negative   03/28/2017 12:00 AM Negative  Negative   09/18/2016 12:00 AM Negative  Negative   12/06/2015 12:00 AM Negative  Negative   02/24/2015 12:00 AM Negative  Negative     Assessment: Walter Howell comes in today to follow up for PrEP. He was previously taking Truvada but wished to try injections for PrEP, so I gave him his first dose on 01/14/23. He was scheduled to come in on 02/06/23 for his 2nd Apretude injection but did not show up for his appointment. He states that he had to work. He did have Truvada left over and took that up until ~1 week ago. He states that he has not had any partners since his last appointment but agrees to STI testing today.  He is still interested in being on the injection. I went over the importance of coming to his appointments again and that it is only effective if he stays on track and comes within his target window. Advised that he will not have adequate protection if he misses his injections. Will go ahead and restart him on the injection in hopes that he will make his follow up appointments. He works a day shift and night shift job but states that any day right after lunch works for his appointments. Usually would like a HIV test beforehand but since he was taking Truvada and has not  had any sexual encounters since he ran out will go ahead and give his injection today.   Eligible for Hepatitis A and B vaccines and he would like to defer until next appointment. Declines flu vaccine and has already received HPV vaccine. Inquired about doxyPEP and he is interested in starting. Discussed administration, effectiveness, and side effects. Will send Rx to [facility]. Will see him back in 1 month for his 2nd injection.  . Plan: - Restart Apretude IM x 1 - DoxyPEP RX sent to  [facility] - HIV RNA, RPR, urine/rectal/pharyngeal cytologies for GC/chlamydia today - Follow up for 2nd Apretude injection on 05/30/23 with Marchelle Folks  Tnya Ades L. Demeisha Geraghty, PharmD, BCIDP, AAHIVP, CPP Clinical Pharmacist Practitioner Infectious Diseases Clinical Pharmacist [facility] 04/24/2023, 4:15 PM

## 2023-04-29 ENCOUNTER — Ambulatory Visit (INDEPENDENT_AMBULATORY_CARE_PROVIDER_SITE_OTHER): Payer: Medicaid Other | Admitting: Pharmacist

## 2023-04-29 ENCOUNTER — Other Ambulatory Visit: Payer: Self-pay

## 2023-04-29 ENCOUNTER — Other Ambulatory Visit (HOSPITAL_COMMUNITY)
Admission: RE | Admit: 2023-04-29 | Discharge: 2023-04-29 | Disposition: A | Discharge status: Home / Self Care | Source: Ambulatory Visit | Attending: Infectious Disease | Admitting: Infectious Disease

## 2023-04-29 ENCOUNTER — Other Ambulatory Visit (HOSPITAL_COMMUNITY): Payer: Self-pay

## 2023-04-29 DIAGNOSIS — Z2981 Encounter for HIV pre-exposure prophylaxis: Secondary | ICD-10-CM

## 2023-04-29 DIAGNOSIS — Z113 Encounter for screening for infections with a predominantly sexual mode of transmission: Secondary | ICD-10-CM

## 2023-04-29 MED ORDER — CABOTEGRAVIR ER 600 MG/3ML IM SUER
600.0000 mg | Freq: Once | INTRAMUSCULAR | Status: AC
  Administered 2023-04-29: 600 mg via INTRAMUSCULAR

## 2023-04-29 MED ORDER — APRETUDE 600 MG/3ML IM SUER
600.0000 mg | INTRAMUSCULAR | 1 refills | Status: DC
  Filled 2023-04-29: qty 3, 30d supply, fill #0
  Filled 2023-05-16: qty 3, 30d supply, fill #1

## 2023-04-29 MED ORDER — DOXYCYCLINE HYCLATE 100 MG PO TABS: ORAL_TABLET | ORAL | 1 refills | Status: DC

## 2023-04-29 NOTE — Progress Notes (Signed)
 Specialty Pharmacy Initial Fill Coordination Note  Walter Howell is a 38 y.o. male contacted today regarding initial fill of specialty medication(s) Cabotegravir (Apretude)   Patient requested Courier to Provider Office   Delivery date: 05/01/23   Verified address: [address] [place] [address]   Medication will be filled on 04/30/23.   Patient is aware of 0.00 copayment.

## 2023-04-30 LAB — URINE CYTOLOGY ANCILLARY ONLY
Chlamydia: NEGATIVE
Comment: NEGATIVE
Comment: NORMAL
Neisseria Gonorrhea: NEGATIVE

## 2023-04-30 LAB — CYTOLOGY, (ORAL, ANAL, URETHRAL) ANCILLARY ONLY
Chlamydia: NEGATIVE
Chlamydia: NEGATIVE
Comment: NEGATIVE
Comment: NEGATIVE
Comment: NORMAL
Comment: NORMAL
Neisseria Gonorrhea: NEGATIVE
Neisseria Gonorrhea: NEGATIVE

## 2023-05-01 ENCOUNTER — Telehealth: Payer: Self-pay

## 2023-05-01 LAB — HIV-1 RNA QUANT-NO REFLEX-BLD
HIV 1 RNA Quant: NOT DETECTED {copies}/mL
HIV-1 RNA Quant, Log: NOT DETECTED {Log_copies}/mL

## 2023-05-01 LAB — RPR: RPR Ser Ql: NONREACTIVE

## 2023-05-01 NOTE — Telephone Encounter (Signed)
 [facility] Patient Advocate Encounter  Patient's medications APRETUDE have been couriered to [facility] from [facility] pharmacy and will be administered at the patients appointment on 05/30/23.  Kae Heller, CPhT Specialty Pharmacy Patient [facility] for Infectious Disease Phone: [phone] Fax:  [phone]

## 2023-05-16 ENCOUNTER — Other Ambulatory Visit: Payer: Self-pay

## 2023-05-16 ENCOUNTER — Other Ambulatory Visit (HOSPITAL_COMMUNITY): Payer: Self-pay

## 2023-05-16 ENCOUNTER — Emergency Department (HOSPITAL_BASED_OUTPATIENT_CLINIC_OR_DEPARTMENT_OTHER)
Admission: EM | Admit: 2023-05-16 | Discharge: 2023-05-16 | Disposition: A | Discharge status: Home / Self Care | Attending: Emergency Medicine | Admitting: Emergency Medicine

## 2023-05-16 ENCOUNTER — Encounter (HOSPITAL_BASED_OUTPATIENT_CLINIC_OR_DEPARTMENT_OTHER): Payer: Self-pay | Admitting: Emergency Medicine

## 2023-05-16 DIAGNOSIS — Z79899 Other long term (current) drug therapy: Secondary | ICD-10-CM | POA: Insufficient documentation

## 2023-05-16 DIAGNOSIS — J101 Influenza due to other identified influenza virus with other respiratory manifestations: Secondary | ICD-10-CM | POA: Diagnosis not present

## 2023-05-16 DIAGNOSIS — I10 Essential (primary) hypertension: Secondary | ICD-10-CM | POA: Diagnosis not present

## 2023-05-16 DIAGNOSIS — R051 Acute cough: Secondary | ICD-10-CM | POA: Diagnosis not present

## 2023-05-16 DIAGNOSIS — Z87891 Personal history of nicotine dependence: Secondary | ICD-10-CM | POA: Diagnosis not present

## 2023-05-16 DIAGNOSIS — R059 Cough, unspecified: Secondary | ICD-10-CM | POA: Diagnosis present

## 2023-05-16 DIAGNOSIS — R0981 Nasal congestion: Secondary | ICD-10-CM

## 2023-05-16 LAB — RESP PANEL BY RT-PCR (RSV, FLU A&B, COVID)  RVPGX2
Influenza A by PCR: NEGATIVE
Influenza B by PCR: POSITIVE — AB
Resp Syncytial Virus by PCR: NEGATIVE
SARS Coronavirus 2 by RT PCR: NEGATIVE

## 2023-05-16 MED ORDER — BENZONATATE 100 MG PO CAPS: 100.0000 mg | ORAL_CAPSULE | Freq: Three times a day (TID) | ORAL | 0 refills | Status: AC | PRN

## 2023-05-16 NOTE — Progress Notes (Signed)
 Specialty Pharmacy Refill Coordination Note  Walter Howell is a 38 y.o. male assessed today regarding refills of clinic administered specialty medication(s) Cabotegravir (Apretude)   Clinic requested Courier to Provider Office   Delivery date: 05/27/23   Verified address: [address] [place] [address]   Medication will be filled on 05/24/23.

## 2023-05-16 NOTE — Discharge Instructions (Addendum)
 As discussed, you did test positive for influenza B which is most likely causing your symptoms.  Will recommend over-the-counter NyQuil/DayQuil as we discussed as you already have that at home for treatment of your respiratory symptoms.  Will send in cough medicine to use as needed.  Also recommend over-the-counter use of Pepto-Bismol/Imodium for any persistent/excessive diarrhea.  Please do not hesitate to return to the emergency department if the worrisome signs and symptoms we discussed become apparent.

## 2023-05-16 NOTE — ED Triage Notes (Signed)
 Pt caox4, ambulatory, NAD c/o productive cough, congestion, diarrhea, fatigue x2 days.

## 2023-05-16 NOTE — ED Provider Notes (Signed)
 [place] EMERGENCY DEPARTMENT AT [facility] Provider Note   CSN: [id number] Arrival date & time: 05/16/23  1808     History  Chief Complaint  Patient presents with   Cough    Walter Howell is a 38 y.o. male.   Cough   38 year old male presents emergency department with complaints of cough, nasal congestion, chills/body aches.  Reports symptoms present for the past couple of days.  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  Has taken no medication for his symptoms.  Also reporting 2 loose bowel movements since symptom onset.  Denies any abdominal pain, chest pain, shortness of breath.  Past medical history significant for seasonal allergies, hypertension, GERD, chronic low back pain, BPPV  Home Medications Prior to Admission medications   Medication Sig Start Date End Date Taking? Authorizing Provider  benzonatate (TESSALON) 100 MG capsule Take 1 capsule (100 mg total) by mouth 3 (three) times daily as needed. 05/16/23  Yes Sherian Maroon A, PA  acetaminophen (TYLENOL) 500 MG tablet Take 1,000 mg by mouth every 6 (six) hours as needed for mild pain, moderate pain, fever or headache.    [provider]  albuterol (VENTOLIN HFA) 108 (90 Base) MCG/ACT inhaler TAKE 2 PUFFS BY MOUTH EVERY 6 HOURS AS NEEDED FOR WHEEZE OR SHORTNESS OF BREATH 04/10/22   Paseda, Baird Kay, FNP  Budesonide 90 MCG/ACT inhaler Inhale 1 puff into the lungs 2 (two) times daily. Patient not taking: Reported on 01/08/2022 07/27/20   Valinda Hoar, NP  cabotegravir ER (APRETUDE) 600 MG/3ML injection Inject 3 mLs (600 mg total) into the muscle every 30 (thirty) days. 04/29/23   Kuppelweiser, Cassie L, RPH-CPP  cetirizine (ZYRTEC) 10 MG tablet Take 1 tablet (10 mg total) by mouth daily. 09/13/20   Barbette Merino, NP  cyclobenzaprine (FLEXERIL) 5 MG tablet Take 1 tablet (5 mg total) by mouth 3 (three) times daily as needed for muscle spasms. 09/26/22    Donell Beers, FNP  doxycycline (VIBRA-TABS) 100 MG tablet Take 2 tablets (200 mg) by mouth 24-72 hours after unprotected sex 04/29/23   Kuppelweiser, Cassie L, RPH-CPP  emtricitabine-tenofovir (TRUVADA) 200-300 MG tablet Take 1 tablet by mouth daily. 01/14/23   Kuppelweiser, Cassie L, RPH-CPP  lidocaine (LIDODERM) 5 % Place 1 patch onto the skin daily. Remove & Discard patch within 12 hours or as directed by MD Patient not taking: Reported on 01/09/2023 05/15/21   Barbette Merino, NP  Lidocaine HCl-Benzyl Alcohol (SALONPAS LIDOCAINE PLUS) 4-10 % CREA Apply 1 application. topically every 8 (eight) hours. Patient not taking: Reported on 01/08/2022 05/15/21   Barbette Merino, NP  losartan-hydrochlorothiazide (HYZAAR) 100-25 MG tablet Take 0.5 tablets by mouth daily. 10/09/22   Donell Beers, FNP  meclizine (ANTIVERT) 25 MG tablet Take 1 tablet (25 mg total) by mouth 3 (three) times daily as needed for dizziness. 11/23/19   Kallie Locks, FNP  Multiple Vitamin (MULTIVITAMIN WITH MINERALS) TABS tablet Take 1 tablet by mouth daily.    [provider]  omeprazole (PRILOSEC) 20 MG capsule TAKE 1 CAPSULE BY MOUTH EVERY DAY 10/22/22   Paseda, Baird Kay, FNP  ondansetron (ZOFRAN) 4 MG tablet Take 1 tablet (4 mg total) by mouth every 8 (eight) hours as needed for nausea or vomiting. 10/13/21   Ellsworth Lennox, PA-C      Allergies    Prednisone    Review of Systems   Review of  Systems  Respiratory:  Positive for cough.   All other systems reviewed and are negative.   Physical Exam Updated Vital Signs BP 130/87   Pulse 67   Temp 98.2 F (36.8 C) (Oral)   Resp 20   Ht 5\' 11"  (1.803 m)   Wt 102.1 kg   SpO2 95%   BMI 31.38 kg/m  Physical Exam Vitals and nursing note reviewed.  Constitutional:      General: He is not in acute distress.    Appearance: He is well-developed.  HENT:     Head: Normocephalic and atraumatic.     Nose: Congestion present.     Mouth/Throat:      Pharynx: No oropharyngeal exudate.  Eyes:     Conjunctiva/sclera: Conjunctivae normal.  Cardiovascular:     Rate and Rhythm: Normal rate and regular rhythm.     Heart sounds: No murmur heard. Pulmonary:     Effort: Pulmonary effort is normal. No respiratory distress.     Breath sounds: Normal breath sounds. No wheezing, rhonchi or rales.  Abdominal:     Palpations: Abdomen is soft.     Tenderness: There is no abdominal tenderness. There is no guarding.  Musculoskeletal:        General: No swelling.     Cervical back: Neck supple.  Skin:    General: Skin is warm and dry.     Capillary Refill: Capillary refill takes less than 2 seconds.  Neurological:     Mental Status: He is alert.  Psychiatric:        Mood and Affect: Mood normal.     ED Results / Procedures / Treatments   Labs (all labs ordered are listed, but only abnormal results are displayed) Labs Reviewed  RESP PANEL BY RT-PCR (RSV, FLU A&B, COVID)  RVPGX2 - Abnormal; Notable for the following components:      Result Value   Influenza B by PCR POSITIVE (*)    All other components within normal limits    EKG None  Radiology No results found.  Procedures Procedures    Medications Ordered in ED Medications - No data to display  ED Course/ Medical Decision Making/ A&P                                 Medical Decision Making  This patient presents to the ED for concern of cough, congestion, this involves an extensive number of treatment options, and is a complaint that carries with it a high risk of complications and morbidity.  The differential diagnosis includes COVID, flu, RSV, pneumonia, viral URI, other   Co morbidities that complicate the patient evaluation  See HPI   Additional history obtained:  Additional history obtained from EMR External records from outside source obtained and reviewed including hospital records   Lab Tests:  I Ordered, and personally interpreted labs.  The pertinent  results include: Viral testing positive for influenza B   Imaging Studies ordered:  N/a   Cardiac Monitoring: / EKG:  The patient was maintained on a cardiac monitor.  I personally viewed and interpreted the cardiac monitored which showed an underlying rhythm of: sinus rhythm   Consultations Obtained:  N/a   Problem List / ED Course / Critical interventions / Medication management  Cough, nasal congestion Reevaluation of the patient showed that the patient stayed the same I have reviewed the patients home medicines and have made adjustments as needed  Social Determinants of Health:  Former cigarette use.  Denies illicit drug use.   Test / Admission - Considered:  Cough, nasal congestion Vitals signs within normal range and stable throughout visit. Laboratory  studies significant for: See above  38 year old male presents emergency department with complaints of cough, nasal congestion, chills/body aches.  Reports symptoms present for the past couple of days.  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  Has taken no medication for his symptoms.  Also reporting 2 loose bowel movements since symptom onset.  Denies any abdominal pain, chest pain, shortness of breath. On exam, lungs clear to auscultation, abdominal exam benign.  Viral testing positive for influenza B which is most likely causing symptoms.  Will recommend symptomatic therapy as described in AVS and close follow-up with PCP in the outpatient setting for reassessment.  Treatment plan discussed at length with patient and he acknowledged understanding was agreeable to said plan.  Patient overall well-appearing, afebrile in no acute distress. Worrisome signs and symptoms were discussed with the patient, and the patient acknowledged understanding to return to the ED if noticed. Patient was stable upon discharge.          Final Clinical Impression(s) / ED Diagnoses Final  diagnoses:  Acute cough  Nasal congestion  Influenza B    Rx / DC Orders ED Discharge Orders          Ordered    benzonatate (TESSALON) 100 MG capsule  3 times daily PRN        05/16/23 1920              Peter Garter, [place] 05/16/23 Lynnea Ferrier, MD 05/16/23 2015

## 2023-05-20 ENCOUNTER — Telehealth: Payer: Self-pay

## 2023-05-20 NOTE — Transitions of Care (Post Inpatient/ED Visit) (Signed)
   05/20/2023  Name: Walter Howell MRN: [id number] DOB: 1985/09/21  Today's TOC FU Call Status: Today's TOC FU Call Status:: Unsuccessful Call (1st Attempt) Unsuccessful Call (1st Attempt) Date: 05/20/23  Attempted to reach the patient regarding the most recent Inpatient/ED visit.  Follow Up Plan: Additional outreach attempts will be made to reach the patient to complete the Transitions of Care (Post Inpatient/ED visit) call.   Signature  [employer], [place]

## 2023-05-24 ENCOUNTER — Other Ambulatory Visit: Payer: Self-pay

## 2023-05-27 ENCOUNTER — Telehealth: Payer: Self-pay

## 2023-05-27 NOTE — Telephone Encounter (Signed)
 [facility] Patient Advocate Encounter  Patient's medications APRETUDE have been couriered to [facility] from [facility] pharmacy and will be administered at the patients appointment on 05/30/23.  Kae Heller, CPhT Specialty Pharmacy Patient [facility] for Infectious Disease Phone: [phone] Fax:  [phone]

## 2023-05-27 NOTE — Progress Notes (Unsigned)
 HPI: Walter Howell is a 38 y.o. male who presents to the [facility] pharmacy clinic for Apretude administration and HIV PrEP follow up.  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   Exposure to gonorrhea 08/21/2022   Annual physical exam 04/10/2022   High risk homosexual behavior 04/10/2022   Marijuana user 04/10/2022   Blurry vision, bilateral 04/10/2022   Hyperkalemia 01/10/2022   Chronic right shoulder pain 01/08/2022   High risk bisexual behavior 01/08/2022   Mild intermittent asthma without complication 09/04/2019   Obesity with serious comorbidity 09/04/2019   Benign paroxysmal positional vertigo 07/23/2017   TMJ (temporomandibular joint disorder) 07/23/2017   Gastroesophageal reflux disease without esophagitis 07/23/2017   Chest pain 01/23/2017   History of herniated intervertebral disc 12/25/2016   Palpitations 12/25/2016   Anxiety and depression 12/06/2015   Chronic bilateral low back pain without sciatica 12/06/2015   Essential hypertension 07/08/2013    Patient's Medications  New Prescriptions   No medications on file  Previous Medications   ACETAMINOPHEN (TYLENOL) 500 MG TABLET    Take 1,000 mg by mouth every 6 (six) hours as needed for mild pain, moderate pain, fever or headache.   ALBUTEROL (VENTOLIN HFA) 108 (90 BASE) MCG/ACT INHALER    TAKE 2 PUFFS BY MOUTH EVERY 6 HOURS AS NEEDED FOR WHEEZE OR SHORTNESS OF BREATH   BENZONATATE (TESSALON) 100 MG CAPSULE    Take 1 capsule (100 mg total) by mouth 3 (three) times daily as needed.   BUDESONIDE 90 MCG/ACT INHALER    Inhale 1 puff into the lungs 2 (two) times daily.   CABOTEGRAVIR ER (APRETUDE) 600 MG/3ML INJECTION    Inject 3 mLs (600 mg total) into the muscle every 30 (thirty) days.   CETIRIZINE (ZYRTEC) 10 MG TABLET    Take 1 tablet (10 mg total) by mouth daily.   CYCLOBENZAPRINE (FLEXERIL) 5 MG TABLET    Take 1 tablet (5 mg total) by mouth 3 (three) times daily as  needed for muscle spasms.   DOXYCYCLINE (VIBRA-TABS) 100 MG TABLET    Take 2 tablets (200 mg) by mouth 24-72 hours after unprotected sex   EMTRICITABINE-TENOFOVIR (TRUVADA) 200-300 MG TABLET    Take 1 tablet by mouth daily.   LIDOCAINE (LIDODERM) 5 %    Place 1 patch onto the skin daily. Remove & Discard patch within 12 hours or as directed by MD   LIDOCAINE HCL-BENZYL ALCOHOL (SALONPAS LIDOCAINE PLUS) 4-10 % CREA    Apply 1 application. topically every 8 (eight) hours.   LOSARTAN-HYDROCHLOROTHIAZIDE (HYZAAR) 100-25 MG TABLET    Take 0.5 tablets by mouth daily.   MECLIZINE (ANTIVERT) 25 MG TABLET    Take 1 tablet (25 mg total) by mouth 3 (three) times daily as needed for dizziness.   MULTIPLE VITAMIN (MULTIVITAMIN WITH MINERALS) TABS TABLET    Take 1 tablet by mouth daily.   OMEPRAZOLE (PRILOSEC) 20 MG CAPSULE    TAKE 1 CAPSULE BY MOUTH EVERY DAY   ONDANSETRON (ZOFRAN) 4 MG TABLET    Take 1 tablet (4 mg total) by mouth every 8 (eight) hours as needed for nausea or vomiting.  Modified Medications   No medications on file  Discontinued Medications   No medications on file    Allergies: Allergies  Allergen Reactions   Prednisone     "makes him feel weird"    Past Medical History: Past Medical History:  Diagnosis Date   Anxiety  Asthma    Coronavirus infection 01/2020   COVID    Dizziness    GERD (gastroesophageal reflux disease)    Hypertension    Seasonal allergies    Vertigo    Vitamin D deficiency     Social History: Social History   Socioeconomic History   Marital status: Significant Other    Spouse name: Not on file   Number of children: Not on file   Years of education: Not on file   Highest education level: Not on file  Occupational History   Not on file  Tobacco Use   Smoking status: Former    Passive exposure: Never   Smokeless tobacco: Never  Vaping Use   Vaping status: Never Used  Substance and Sexual Activity   Alcohol use: Yes    Comment:  Occasionally   Drug use: Yes    Types: Marijuana   Sexual activity: Yes    Comment: has male partner would like PREP.  Other Topics Concern   Not on file  Social History Narrative   Lives with his partner    Social Drivers of Health   Financial Resource Strain: Not on file  Food Insecurity: Not on file  Transportation Needs: Not on file  Physical Activity: Not on file  Stress: Not on file  Social Connections: Unknown (07/10/2021)   Received [facility]   Social Network    Social Network: Not on file    Labs: Lab Results  Component Value Date   HIV1RNAQUANT Not Detected 04/29/2023   HIV1RNAQUANT Not Detected 01/14/2023    RPR and STI Lab Results  Component Value Date   LABRPR NON-REACTIVE 04/29/2023   LABRPR NON-REACTIVE 01/14/2023   LABRPR NON-REACTIVE 07/10/2022   LABRPR Non Reactive 04/10/2022   LABRPR Non Reactive 01/09/2022    STI Results GC CT  04/29/2023  1:37 PM Negative    Negative    Negative  Negative    Negative    Negative   01/14/2023  1:59 PM Negative    Negative    Negative  Negative    Negative    Negative   07/10/2022  3:34 PM Negative    Negative    Negative  Negative    Negative    Negative   10/13/2021  9:41 AM Negative  Negative   09/07/2019  4:32 PM Negative  Negative   07/29/2019  9:01 AM Negative  Negative   05/21/2019 11:08 AM Negative  Negative   02/12/2019  1:57 PM Negative  Negative   11/20/2018 12:00 AM Negative  Negative   05/12/2018 12:00 AM Negative  Negative   01/02/2018 12:00 AM Negative  Negative   03/28/2017 12:00 AM Negative  Negative   09/18/2016 12:00 AM Negative  Negative   12/06/2015 12:00 AM Negative  Negative   02/24/2015 12:00 AM Negative  Negative     Hepatitis B Lab Results  Component Value Date   HEPBSAG Negative 10/10/2022   HEPBCAB Negative 10/10/2022   Hepatitis C No results found for: "HEPCAB", "HCVRNAPCRQN" Hepatitis A Lab Results  Component Value Date   HAV  NON-REACTIVE 07/10/2022   Lipids: Lab Results  Component Value Date   CHOL 174 04/10/2022   TRIG 97 04/10/2022   HDL 42 04/10/2022   CHOLHDL 4.1 04/10/2022   LDLCALC 114 (H) 04/10/2022    TARGET DATE: The 3rd of the month  Assessment: Douglas presents today for their Apretude injection and to follow up for HIV PrEP. No issues  with past injections.  Screened patient for acute HIV symptoms such as fatigue, muscle aches, rash, sore throat, lymphadenopathy, headache, night sweats, nausea/vomiting/diarrhea, and fever. Patient denies any symptoms.   Per [employer] guidelines, a rapid HIV test should be drawn prior to Apretude administration. Due to state shortage of rapid HIV tests, this is temporarily unable to be done. Per decision from [facility] physicians, we will proceed with Apretude administration at this time without a negative rapid HIV test beforehand. HIV RNA was collected today and is in process.  Administered cabotegravir 600mg /86mL in right upper outer quadrant of the gluteal muscle. Will make follow up appointments for maintenance injections every 2 months.   No known exposures to any STIs and no signs or symptoms of any STIs today. Last STI screening was last month and was negative. No new partners since last injection; will check routine screening with RPR and urine/oral/rectal cytologies today.   Eligible for HAV and HBV vaccines which he defers today.   Plan: - Administer Apretude 600 mg x 1  - Maintenance injections scheduled for 5/27 with me  - Check HIV RNA, RPR, and urine/oral/rectal cytologies  - Call with any issues or questions  Margarite Gouge, PharmD, CPP, BCIDP, AAHIVP Clinical Pharmacist Practitioner Infectious Diseases Clinical Pharmacist [facility]

## 2023-05-30 ENCOUNTER — Ambulatory Visit: Admitting: Pharmacist

## 2023-05-30 ENCOUNTER — Other Ambulatory Visit: Payer: Self-pay

## 2023-05-30 ENCOUNTER — Other Ambulatory Visit (HOSPITAL_COMMUNITY)
Admission: RE | Admit: 2023-05-30 | Discharge: 2023-05-30 | Disposition: A | Discharge status: Home / Self Care | Source: Ambulatory Visit | Attending: Infectious Disease | Admitting: Infectious Disease

## 2023-05-30 DIAGNOSIS — Z2981 Encounter for HIV pre-exposure prophylaxis: Secondary | ICD-10-CM

## 2023-05-30 DIAGNOSIS — Z113 Encounter for screening for infections with a predominantly sexual mode of transmission: Secondary | ICD-10-CM

## 2023-05-30 MED ORDER — CABOTEGRAVIR ER 600 MG/3ML IM SUER
600.0000 mg | Freq: Once | INTRAMUSCULAR | Status: AC
  Administered 2023-05-30: 600 mg via INTRAMUSCULAR

## 2023-05-31 LAB — CYTOLOGY, (ORAL, ANAL, URETHRAL) ANCILLARY ONLY
Chlamydia: NEGATIVE
Chlamydia: NEGATIVE
Comment: NEGATIVE
Comment: NEGATIVE
Comment: NORMAL
Comment: NORMAL
Neisseria Gonorrhea: NEGATIVE
Neisseria Gonorrhea: NEGATIVE

## 2023-05-31 LAB — URINE CYTOLOGY ANCILLARY ONLY
Chlamydia: NEGATIVE
Comment: NEGATIVE
Comment: NORMAL
Neisseria Gonorrhea: NEGATIVE

## 2023-06-01 LAB — HIV-1 RNA QUANT-NO REFLEX-BLD
HIV 1 RNA Quant: NOT DETECTED {copies}/mL
HIV-1 RNA Quant, Log: NOT DETECTED {Log_copies}/mL

## 2023-06-01 LAB — RPR: RPR Ser Ql: NONREACTIVE

## 2023-07-08 ENCOUNTER — Other Ambulatory Visit (HOSPITAL_COMMUNITY): Payer: Self-pay

## 2023-07-09 ENCOUNTER — Other Ambulatory Visit: Payer: Self-pay

## 2023-07-09 ENCOUNTER — Other Ambulatory Visit (HOSPITAL_COMMUNITY): Payer: Self-pay

## 2023-07-09 ENCOUNTER — Other Ambulatory Visit: Payer: Self-pay | Admitting: Pharmacist

## 2023-07-09 DIAGNOSIS — Z2981 Encounter for HIV pre-exposure prophylaxis: Secondary | ICD-10-CM

## 2023-07-09 MED ORDER — APRETUDE 600 MG/3ML IM SUER
600.0000 mg | INTRAMUSCULAR | 5 refills | Status: DC
  Filled 2023-07-09: qty 3, 60d supply, fill #0
  Filled 2023-09-09: qty 3, 60d supply, fill #1

## 2023-07-09 NOTE — Progress Notes (Signed)
 Specialty Pharmacy Refill Coordination Note  Walter Howell is a 38 y.o. male assessed today regarding refills of clinic administered specialty medication(s) Cabotegravir  (Apretude )   Clinic requested Courier to Provider Office   Delivery date: 07/15/23   Verified address: [address] [place] [address]   Medication will be filled on 07/12/23.

## 2023-07-15 ENCOUNTER — Telehealth: Payer: Self-pay

## 2023-07-15 NOTE — Telephone Encounter (Signed)
 [facility] Patient Advocate Encounter  Patient's medications Apretude  have been couriered to [facility] from [facility] and will be administered at the patients appointment on 07/23/23.  Roylene Corn, CPhT Specialty Pharmacy Patient [facility] Phone: [phone] Fax:  [phone]

## 2023-07-22 NOTE — Progress Notes (Unsigned)
 HPI: Walter Howell is a 38 y.o. male who presents to the [facility] pharmacy clinic for Apretude  administration and HIV PrEP follow up.  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   Exposure to gonorrhea 08/21/2022   Annual physical exam 04/10/2022   High risk homosexual behavior 04/10/2022   Marijuana user 04/10/2022   Blurry vision, bilateral 04/10/2022   Hyperkalemia 01/10/2022   Chronic right shoulder pain 01/08/2022   High risk bisexual behavior 01/08/2022   Mild intermittent asthma without complication 09/04/2019   Obesity with serious comorbidity 09/04/2019   Benign paroxysmal positional vertigo 07/23/2017   TMJ (temporomandibular joint disorder) 07/23/2017   Gastroesophageal reflux disease without esophagitis 07/23/2017   Chest pain 01/23/2017   History of herniated intervertebral disc 12/25/2016   Palpitations 12/25/2016   Anxiety and depression 12/06/2015   Chronic bilateral low back pain without sciatica 12/06/2015   Essential hypertension 07/08/2013    Patient's Medications  New Prescriptions   No medications on file  Previous Medications   ACETAMINOPHEN  (TYLENOL ) 500 MG TABLET    Take 1,000 mg by mouth every 6 (six) hours as needed for mild pain, moderate pain, fever or headache.   ALBUTEROL  (VENTOLIN  HFA) 108 (90 BASE) MCG/ACT INHALER    TAKE 2 PUFFS BY MOUTH EVERY 6 HOURS AS NEEDED FOR WHEEZE OR SHORTNESS OF BREATH   BENZONATATE  (TESSALON ) 100 MG CAPSULE    Take 1 capsule (100 mg total) by mouth 3 (three) times daily as needed.   BUDESONIDE  90 MCG/ACT INHALER    Inhale 1 puff into the lungs 2 (two) times daily.   CABOTEGRAVIR  ER (APRETUDE ) 600 MG/3ML INJECTION    Inject 3 mLs (600 mg total) into the muscle every 2 (two) months.   CETIRIZINE  (ZYRTEC ) 10 MG TABLET    Take 1 tablet (10 mg total) by mouth daily.   CYCLOBENZAPRINE  (FLEXERIL ) 5 MG TABLET    Take 1 tablet (5 mg total) by  mouth 3 (three) times daily as needed for muscle spasms.   DOXYCYCLINE  (VIBRA -TABS) 100 MG TABLET    Take 2 tablets (200 mg) by mouth 24-72 hours after unprotected sex   LIDOCAINE  (LIDODERM ) 5 %    Place 1 patch onto the skin daily. Remove & Discard patch within 12 hours or as directed by MD   LIDOCAINE  HCL-BENZYL ALCOHOL (SALONPAS  LIDOCAINE  PLUS) 4-10 % CREA    Apply 1 application. topically every 8 (eight) hours.   LOSARTAN -HYDROCHLOROTHIAZIDE  (HYZAAR) 100-25 MG TABLET    Take 0.5 tablets by mouth daily.   MECLIZINE  (ANTIVERT ) 25 MG TABLET    Take 1 tablet (25 mg total) by mouth 3 (three) times daily as needed for dizziness.   MULTIPLE VITAMIN (MULTIVITAMIN WITH MINERALS) TABS TABLET    Take 1 tablet by mouth daily.   OMEPRAZOLE  (PRILOSEC) 20 MG CAPSULE    TAKE 1 CAPSULE BY MOUTH EVERY DAY   ONDANSETRON  (ZOFRAN ) 4 MG TABLET    Take 1 tablet (4 mg total) by mouth every 8 (eight) hours as needed for nausea or vomiting.  Modified Medications   No medications on file  Discontinued Medications   No medications on file    Allergies: Allergies  Allergen Reactions   Prednisone      "makes him feel weird"    Labs: Lab Results  Component Value Date   HIV1RNAQUANT NOT DETECTED 05/30/2023   HIV1RNAQUANT Not Detected 04/29/2023  HIV1RNAQUANT Not Detected 01/14/2023    RPR and STI Lab Results  Component Value Date   LABRPR NON-REACTIVE 05/30/2023   LABRPR NON-REACTIVE 04/29/2023   LABRPR NON-REACTIVE 01/14/2023   LABRPR NON-REACTIVE 07/10/2022   LABRPR Non Reactive 04/10/2022    STI Results GC CT  05/30/2023  1:22 PM Negative    Negative    Negative  Negative    Negative    Negative   04/29/2023  1:37 PM Negative    Negative    Negative  Negative    Negative    Negative   01/14/2023  1:59 PM Negative    Negative    Negative  Negative    Negative    Negative   07/10/2022  3:34 PM Negative    Negative    Negative  Negative    Negative    Negative   10/13/2021  9:41 AM  Negative  Negative   09/07/2019  4:32 PM Negative  Negative   07/29/2019  9:01 AM Negative  Negative   05/21/2019 11:08 AM Negative  Negative   02/12/2019  1:57 PM Negative  Negative   11/20/2018 12:00 AM Negative  Negative   05/12/2018 12:00 AM Negative  Negative   01/02/2018 12:00 AM Negative  Negative   03/28/2017 12:00 AM Negative  Negative   09/18/2016 12:00 AM Negative  Negative   12/06/2015 12:00 AM Negative  Negative   02/24/2015 12:00 AM Negative  Negative     Hepatitis B Lab Results  Component Value Date   HEPBSAG Negative 10/10/2022   HEPBCAB Negative 10/10/2022   Hepatitis C No results found for: "HEPCAB", "HCVRNAPCRQN" Hepatitis A Lab Results  Component Value Date   HAV NON-REACTIVE 07/10/2022   Lipids: Lab Results  Component Value Date   CHOL 174 04/10/2022   TRIG 97 04/10/2022   HDL 42 04/10/2022   CHOLHDL 4.1 04/10/2022   LDLCALC 114 (H) 04/10/2022    TARGET DATE: The 3rd  Assessment: Walter Howell presents today for his Apretude  injection and to follow up for HIV PrEP. Endorses minor soreness after last injection that resolved within a few days. Denies any symptoms of acute HIV. Last STI screening was on 05/30/23 and was negative. No known exposures to any STIs since last visit. Agrees to full STI testing today. He reports doxyPEP is going well and he needs a refill. Counseled that he does have a refill on file at [facility] and he should request this from them. Of note, patient reports he thinks his Medicaid is ending on 5/31 because he needs to renew it and is still working to get the paperwork together. He was concerned that he may not be covered at the time of his next visit. I instructed him to go ahead and re-enroll in Medicaid to ensure he has coverage and we are able to get Apretude  filled for his next injection visit.  Per [employer] guidelines, a rapid HIV test should be drawn prior to Apretude  administration. Due to state shortage of rapid HIV  tests, this is temporarily unable to be done. Per decision from [facility] physicians, we will proceed with Apretude  administration at this time without a negative rapid HIV test beforehand. HIV RNA was collected today and is in process.  Administered cabotegravir  600mg /28mL in right upper outer quadrant of the gluteal muscle. Will see him back in 2 months for injection, labs, and HIV PrEP follow up.  Hepatitis B surface antibody and Hepatitis A antibody were non-reactive in 2024 indicating need  for vaccination. Discussed eligibility and he deferred these vaccines today. Patient previously reported he already received HPV vaccines.  Plan: - Apretude  injection administered - HIV RNA, RPR, urine/rectal/pharyngeal cytologies today  - Continue doxyPEP - Next injection, labs, and PrEP follow up appointment scheduled for 09/24/23 - Call with any issues or questions  Georga Killings, PharmD PGY-1 Pharmacy Resident

## 2023-07-23 ENCOUNTER — Other Ambulatory Visit: Payer: Self-pay

## 2023-07-23 ENCOUNTER — Ambulatory Visit (INDEPENDENT_AMBULATORY_CARE_PROVIDER_SITE_OTHER): Admitting: Pharmacist

## 2023-07-23 DIAGNOSIS — Z113 Encounter for screening for infections with a predominantly sexual mode of transmission: Secondary | ICD-10-CM

## 2023-07-23 DIAGNOSIS — Z2981 Encounter for HIV pre-exposure prophylaxis: Secondary | ICD-10-CM | POA: Diagnosis present

## 2023-07-23 MED ORDER — CABOTEGRAVIR ER 600 MG/3ML IM SUER
600.0000 mg | Freq: Once | INTRAMUSCULAR | Status: AC
  Administered 2023-07-23: 600 mg via INTRAMUSCULAR

## 2023-07-24 LAB — CT/NG RNA, TMA RECTAL
Chlamydia Trachomatis RNA: NOT DETECTED
Neisseria Gonorrhoeae RNA: NOT DETECTED

## 2023-07-24 LAB — GC/CHLAMYDIA PROBE, AMP (THROAT)
Chlamydia trachomatis RNA: NOT DETECTED
Neisseria gonorrhoeae RNA: NOT DETECTED

## 2023-07-24 LAB — C. TRACHOMATIS/N. GONORRHOEAE RNA
C. trachomatis RNA, TMA: NOT DETECTED
N. gonorrhoeae RNA, TMA: NOT DETECTED

## 2023-07-24 NOTE — Progress Notes (Signed)
 Sent to [facility]

## 2023-07-25 LAB — HIV-1 RNA QUANT-NO REFLEX-BLD
HIV 1 RNA Quant: NOT DETECTED {copies}/mL
HIV-1 RNA Quant, Log: NOT DETECTED {Log_copies}/mL

## 2023-07-25 LAB — RPR: RPR Ser Ql: NONREACTIVE

## 2023-07-29 ENCOUNTER — Ambulatory Visit: Payer: Self-pay | Admitting: Nurse Practitioner

## 2023-09-09 ENCOUNTER — Other Ambulatory Visit: Payer: Self-pay

## 2023-09-09 ENCOUNTER — Other Ambulatory Visit (HOSPITAL_COMMUNITY): Payer: Self-pay

## 2023-09-09 NOTE — Progress Notes (Signed)
 Specialty Pharmacy Refill Coordination Note  Walter Howell is a 38 y.o. male assessed today regarding refills of clinic administered specialty medication(s) Cabotegravir  (Apretude )   Clinic requested Courier to Provider Office   Delivery date: 09/16/23   Verified address: [address] [place] [address]   Medication will be filled on 09/13/23.

## 2023-09-13 ENCOUNTER — Other Ambulatory Visit: Payer: Self-pay

## 2023-09-13 ENCOUNTER — Other Ambulatory Visit (HOSPITAL_COMMUNITY): Payer: Self-pay

## 2023-09-13 ENCOUNTER — Ambulatory Visit: Payer: Self-pay | Admitting: Nurse Practitioner

## 2023-09-17 ENCOUNTER — Other Ambulatory Visit: Payer: Self-pay | Admitting: Pharmacist

## 2023-09-17 DIAGNOSIS — Z2981 Encounter for HIV pre-exposure prophylaxis: Secondary | ICD-10-CM

## 2023-09-17 MED ORDER — APRETUDE 600 MG/3ML IM SUER
600.0000 mg | INTRAMUSCULAR | 5 refills | Status: DC
Start: 2023-09-17 — End: 2023-09-26

## 2023-09-18 ENCOUNTER — Telehealth: Payer: Self-pay

## 2023-09-18 NOTE — Telephone Encounter (Signed)
 [facility] Patient Advocate Encounter  Completed and sent [employer] Patient Assistance application for APRETUDE  for this patient who is uninsured.    Patient assistance phone number for follow up is [phone].   Patient ID # [id number]  This encounter will be updated until final determination.   Arland Hutchinson, CPhT Specialty Pharmacy Patient [facility] for Infectious Disease Phone: [phone] Fax:  [phone]

## 2023-09-19 ENCOUNTER — Telehealth: Payer: Self-pay

## 2023-09-19 NOTE — Telephone Encounter (Signed)
 [facility] Patient Advocate Encounter  Completed and sent [employer] Patient Assistance  application for APRETUDE  for this patient who is uninsured.    Patient is approved 09/18/23 through 09/16/24.  Medication will be shipped to the office.   Arland Hutchinson, CPhT Specialty Pharmacy Patient [facility] for Infectious Disease Phone: [phone] Fax:  [phone]

## 2023-09-19 NOTE — Telephone Encounter (Signed)
Thanks Donna!

## 2023-09-23 NOTE — Progress Notes (Deleted)
 HPI: Walter Howell is a 38 y.o. male who presents to the [facility] pharmacy clinic for Apretude  administration and HIV PrEP follow up.  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   Exposure to gonorrhea 08/21/2022   Annual physical exam 04/10/2022   High risk homosexual behavior 04/10/2022   Marijuana user 04/10/2022   Blurry vision, bilateral 04/10/2022   Hyperkalemia 01/10/2022   Chronic right shoulder pain 01/08/2022   High risk bisexual behavior 01/08/2022   Mild intermittent asthma without complication 09/04/2019   Obesity with serious comorbidity 09/04/2019   Benign paroxysmal positional vertigo 07/23/2017   TMJ (temporomandibular joint disorder) 07/23/2017   Gastroesophageal reflux disease without esophagitis 07/23/2017   Chest pain 01/23/2017   History of herniated intervertebral disc 12/25/2016   Palpitations 12/25/2016   Anxiety and depression 12/06/2015   Chronic bilateral low back pain without sciatica 12/06/2015   Essential hypertension 07/08/2013    Patient's Medications  New Prescriptions   No medications on file  Previous Medications   ACETAMINOPHEN  (TYLENOL ) 500 MG TABLET    Take 1,000 mg by mouth every 6 (six) hours as needed for mild pain, moderate pain, fever or headache.   ALBUTEROL  (VENTOLIN  HFA) 108 (90 BASE) MCG/ACT INHALER    TAKE 2 PUFFS BY MOUTH EVERY 6 HOURS AS NEEDED FOR WHEEZE OR SHORTNESS OF BREATH   BENZONATATE  (TESSALON ) 100 MG CAPSULE    Take 1 capsule (100 mg total) by mouth 3 (three) times daily as needed.   BUDESONIDE  90 MCG/ACT INHALER    Inhale 1 puff into the lungs 2 (two) times daily.   CABOTEGRAVIR  ER (APRETUDE ) 600 MG/3ML INJECTION    Inject 3 mLs (600 mg total) into the muscle every 2 (two) months.   CETIRIZINE  (ZYRTEC ) 10 MG TABLET    Take 1 tablet (10 mg total) by mouth daily.   CYCLOBENZAPRINE  (FLEXERIL ) 5 MG TABLET    Take 1 tablet (5 mg total) by  mouth 3 (three) times daily as needed for muscle spasms.   DOXYCYCLINE  (VIBRA -TABS) 100 MG TABLET    Take 2 tablets (200 mg) by mouth 24-72 hours after unprotected sex   LIDOCAINE  (LIDODERM ) 5 %    Place 1 patch onto the skin daily. Remove & Discard patch within 12 hours or as directed by MD   LIDOCAINE  HCL-BENZYL ALCOHOL (SALONPAS  LIDOCAINE  PLUS) 4-10 % CREA    Apply 1 application. topically every 8 (eight) hours.   LOSARTAN -HYDROCHLOROTHIAZIDE  (HYZAAR) 100-25 MG TABLET    Take 0.5 tablets by mouth daily.   MECLIZINE  (ANTIVERT ) 25 MG TABLET    Take 1 tablet (25 mg total) by mouth 3 (three) times daily as needed for dizziness.   MULTIPLE VITAMIN (MULTIVITAMIN WITH MINERALS) TABS TABLET    Take 1 tablet by mouth daily.   OMEPRAZOLE  (PRILOSEC) 20 MG CAPSULE    TAKE 1 CAPSULE BY MOUTH EVERY DAY   ONDANSETRON  (ZOFRAN ) 4 MG TABLET    Take 1 tablet (4 mg total) by mouth every 8 (eight) hours as needed for nausea or vomiting.  Modified Medications   No medications on file  Discontinued Medications   No medications on file    Allergies: Allergies  Allergen Reactions   Prednisone      makes him feel weird    Labs: Lab Results  Component Value Date   HIV1RNAQUANT NOT DETECTED 07/23/2023   HIV1RNAQUANT NOT DETECTED 05/30/2023  HIV1RNAQUANT Not Detected 04/29/2023    RPR and STI Lab Results  Component Value Date   LABRPR NON-REACTIVE 07/23/2023   LABRPR NON-REACTIVE 05/30/2023   LABRPR NON-REACTIVE 04/29/2023   LABRPR NON-REACTIVE 01/14/2023   LABRPR NON-REACTIVE 07/10/2022    STI Results GC CT  05/30/2023  1:22 PM Negative    Negative    Negative  Negative    Negative    Negative   04/29/2023  1:37 PM Negative    Negative    Negative  Negative    Negative    Negative   01/14/2023  1:59 PM Negative    Negative    Negative  Negative    Negative    Negative   07/10/2022  3:34 PM Negative    Negative    Negative  Negative    Negative    Negative   10/13/2021  9:41 AM  Negative  Negative   09/07/2019  4:32 PM Negative  Negative   07/29/2019  9:01 AM Negative  Negative   05/21/2019 11:08 AM Negative  Negative   02/12/2019  1:57 PM Negative  Negative   11/20/2018 12:00 AM Negative  Negative   05/12/2018 12:00 AM Negative  Negative   01/02/2018 12:00 AM Negative  Negative   03/28/2017 12:00 AM Negative  Negative   09/18/2016 12:00 AM Negative  Negative   12/06/2015 12:00 AM Negative  Negative   02/24/2015 12:00 AM Negative  Negative     Hepatitis B Lab Results  Component Value Date   HEPBSAG Negative 10/10/2022   HEPBCAB Negative 10/10/2022   Hepatitis C No results found for: HEPCAB, HCVRNAPCRQN Hepatitis A Lab Results  Component Value Date   HAV NON-REACTIVE 07/10/2022   Lipids: Lab Results  Component Value Date   CHOL 174 04/10/2022   TRIG 97 04/10/2022   HDL 42 04/10/2022   CHOLHDL 4.1 04/10/2022   LDLCALC 114 (H) 04/10/2022    TARGET DATE: The 3rd  Assessment: Walter Howell presents today for his Apretude  injection and to follow up for HIV PrEP. No issues with past injections. Denies any symptoms of acute HIV. Last HIV RNA and STI screenings were negative on 07/23/23.  No known exposures to any STIs since last visit. ***Agrees to STI testing today.    Per [employer] guidelines, a rapid HIV test should be drawn prior to Apretude  administration. Due to state shortage of rapid HIV tests, this is temporarily unable to be done. Per decision from [facility] physicians, we will proceed with Apretude  administration at this time without a negative rapid HIV test beforehand. HIV RNA was collected today and is in process.  Administered cabotegravir  600mg /61mL in *** upper outer quadrant of the gluteal muscle. Will see him back in 2 months for injection, labs, and HIV PrEP follow up.  Plan: - Apretude  injection administered - HIV RNA today - Next injection, labs, and PrEP follow up appointment scheduled for *** - Call with any issues or  questions  Jaylee Freeze L. Joseeduardo Brix, PharmD, BCIDP, AAHIVP, CPP Clinical Pharmacist Practitioner - Infectious Diseases Clinical Pharmacist Lead - Specialty Pharmacy [facility] for Infectious Disease

## 2023-09-24 ENCOUNTER — Telehealth: Payer: Self-pay

## 2023-09-24 ENCOUNTER — Ambulatory Visit: Payer: Self-pay | Admitting: Pharmacist

## 2023-09-24 DIAGNOSIS — Z113 Encounter for screening for infections with a predominantly sexual mode of transmission: Secondary | ICD-10-CM

## 2023-09-24 DIAGNOSIS — Z2981 Encounter for HIV pre-exposure prophylaxis: Secondary | ICD-10-CM

## 2023-09-24 NOTE — Telephone Encounter (Signed)
 [facility] Patient Advocate Encounter  Patient's medications Apretude  have been couriered to [facility] from [facility]) Specialty pharmacy and will be administered at the patients appointment on 09/24/23.  Arland Hutchinson, CPhT Specialty Pharmacy Patient [facility] for Infectious Disease Phone: [phone] Fax:  [phone]

## 2023-09-25 ENCOUNTER — Telehealth: Payer: Self-pay

## 2023-09-25 NOTE — Telephone Encounter (Signed)
 Looks like he got scheduled for tomorrow, thanks Cece!

## 2023-09-25 NOTE — Telephone Encounter (Signed)
 Patient called requesting medication refill, I informed him he was on apretude  and needed to reschedule his missed appointment. Patient said his Medicaid is active again and no longer want's to get the injections, If he can go back to the oral medication and have that sent to previous [pharmacy. Best contact number [phone]

## 2023-09-26 ENCOUNTER — Telehealth: Payer: Self-pay

## 2023-09-26 ENCOUNTER — Other Ambulatory Visit (HOSPITAL_COMMUNITY): Payer: Self-pay

## 2023-09-26 ENCOUNTER — Other Ambulatory Visit: Payer: Self-pay

## 2023-09-26 ENCOUNTER — Other Ambulatory Visit (HOSPITAL_COMMUNITY)
Admission: RE | Admit: 2023-09-26 | Discharge: 2023-09-26 | Disposition: A | Discharge status: Home / Self Care | Source: Ambulatory Visit | Attending: Infectious Disease | Admitting: Infectious Disease

## 2023-09-26 ENCOUNTER — Ambulatory Visit: Admitting: Pharmacist

## 2023-09-26 DIAGNOSIS — Z113 Encounter for screening for infections with a predominantly sexual mode of transmission: Secondary | ICD-10-CM | POA: Insufficient documentation

## 2023-09-26 DIAGNOSIS — Z2981 Encounter for HIV pre-exposure prophylaxis: Secondary | ICD-10-CM

## 2023-09-26 LAB — URINE CYTOLOGY ANCILLARY ONLY
Chlamydia: NEGATIVE
Comment: NEGATIVE
Comment: NORMAL
Neisseria Gonorrhea: NEGATIVE

## 2023-09-26 LAB — CYTOLOGY, (ORAL, ANAL, URETHRAL) ANCILLARY ONLY
Chlamydia: NEGATIVE
Chlamydia: NEGATIVE
Comment: NEGATIVE
Comment: NEGATIVE
Comment: NORMAL
Comment: NORMAL
Neisseria Gonorrhea: NEGATIVE
Neisseria Gonorrhea: NEGATIVE

## 2023-09-26 NOTE — Progress Notes (Signed)
 Date:  09/26/2023   HPI: Walter Howell is a 38 y.o. male who presents to the [facility] pharmacy clinic for HIV PrEP follow-up.  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   Exposure to gonorrhea 08/21/2022   Annual physical exam 04/10/2022   High risk homosexual behavior 04/10/2022   Marijuana user 04/10/2022   Blurry vision, bilateral 04/10/2022   Hyperkalemia 01/10/2022   Chronic right shoulder pain 01/08/2022   High risk bisexual behavior 01/08/2022   Mild intermittent asthma without complication 09/04/2019   Obesity with serious comorbidity 09/04/2019   Benign paroxysmal positional vertigo 07/23/2017   TMJ (temporomandibular joint disorder) 07/23/2017   Gastroesophageal reflux disease without esophagitis 07/23/2017   Chest pain 01/23/2017   History of herniated intervertebral disc 12/25/2016   Palpitations 12/25/2016   Anxiety and depression 12/06/2015   Chronic bilateral low back pain without sciatica 12/06/2015   Essential hypertension 07/08/2013    Patient's Medications  New Prescriptions   No medications on file  Previous Medications   ACETAMINOPHEN  (TYLENOL ) 500 MG TABLET    Take 1,000 mg by mouth every 6 (six) hours as needed for mild pain, moderate pain, fever or headache.   ALBUTEROL  (VENTOLIN  HFA) 108 (90 BASE) MCG/ACT INHALER    TAKE 2 PUFFS BY MOUTH EVERY 6 HOURS AS NEEDED FOR WHEEZE OR SHORTNESS OF BREATH   BENZONATATE  (TESSALON ) 100 MG CAPSULE    Take 1 capsule (100 mg total) by mouth 3 (three) times daily as needed.   BUDESONIDE  90 MCG/ACT INHALER    Inhale 1 puff into the lungs 2 (two) times daily.   CABOTEGRAVIR  ER (APRETUDE ) 600 MG/3ML INJECTION    Inject 3 mLs (600 mg total) into the muscle every 2 (two) months.   CETIRIZINE  (ZYRTEC ) 10 MG TABLET    Take 1 tablet (10 mg total) by mouth daily.   CYCLOBENZAPRINE  (FLEXERIL ) 5 MG TABLET    Take 1 tablet (5 mg total) by mouth 3  (three) times daily as needed for muscle spasms.   DOXYCYCLINE  (VIBRA -TABS) 100 MG TABLET    Take 2 tablets (200 mg) by mouth 24-72 hours after unprotected sex   LIDOCAINE  (LIDODERM ) 5 %    Place 1 patch onto the skin daily. Remove & Discard patch within 12 hours or as directed by MD   LIDOCAINE  HCL-BENZYL ALCOHOL (SALONPAS  LIDOCAINE  PLUS) 4-10 % CREA    Apply 1 application. topically every 8 (eight) hours.   LOSARTAN -HYDROCHLOROTHIAZIDE  (HYZAAR) 100-25 MG TABLET    Take 0.5 tablets by mouth daily.   MECLIZINE  (ANTIVERT ) 25 MG TABLET    Take 1 tablet (25 mg total) by mouth 3 (three) times daily as needed for dizziness.   MULTIPLE VITAMIN (MULTIVITAMIN WITH MINERALS) TABS TABLET    Take 1 tablet by mouth daily.   OMEPRAZOLE  (PRILOSEC) 20 MG CAPSULE    TAKE 1 CAPSULE BY MOUTH EVERY DAY   ONDANSETRON  (ZOFRAN ) 4 MG TABLET    Take 1 tablet (4 mg total) by mouth every 8 (eight) hours as needed for nausea or vomiting.  Modified Medications   No medications on file  Discontinued Medications   No medications on file    Allergies: Allergies  Allergen Reactions   Prednisone      makes him feel weird    Past Medical History: Past Medical History:  Diagnosis Date   Anxiety    Asthma  Coronavirus infection 01/2020   COVID    Dizziness    GERD (gastroesophageal reflux disease)    Hypertension    Seasonal allergies    Vertigo    Vitamin D  deficiency     Social History: Social History   Socioeconomic History   Marital status: Significant Other    Spouse name: Not on file   Number of children: Not on file   Years of education: Not on file   Highest education level: Not on file  Occupational History   Not on file  Tobacco Use   Smoking status: Former    Passive exposure: Never   Smokeless tobacco: Never  Vaping Use   Vaping status: Never Used  Substance and Sexual Activity   Alcohol use: Yes    Comment: Occasionally   Drug use: Yes    Types: Marijuana   Sexual activity: Yes     Comment: has male partner would like PREP.  Other Topics Concern   Not on file  Social History Narrative   Lives with his partner    Social Drivers of Health   Financial Resource Strain: Not on file  Food Insecurity: Not on file  Transportation Needs: Not on file  Physical Activity: Not on file  Stress: Not on file  Social Connections: Unknown (07/10/2021)   [facility]   Social Network    Social Network: Not on file       07/10/2022    3:12 PM  CHL HIV PREP FLOWSHEET RESULTS  Insurance Status Uninsured  Gender at birth Male  Gender identity cis-Male  Sex Partners Men only  # sex partners past 3-6 mos 4  Sex activity preferences Oral;Insertive and receptive  Condom use Yes  % condom use 70  Treated for STI? No  PrEP Eligibility Yes    Labs:  SCr: Lab Results  Component Value Date   CREATININE 0.70 04/07/2023   CREATININE 0.94 10/10/2022   CREATININE 0.94 07/07/2022   CREATININE 0.84 01/09/2022   CREATININE 1.02 05/15/2021   HIV Lab Results  Component Value Date   HIV Non Reactive 10/10/2022   HIV NON-REACTIVE 07/10/2022   HIV Non Reactive 04/10/2022   HIV Non Reactive 10/13/2021   HIV Non Reactive 05/15/2021   Hepatitis B Lab Results  Component Value Date   HEPBSAG Negative 10/10/2022   HEPBCAB Negative 10/10/2022   Hepatitis C No results found for: HEPCAB, HCVRNAPCRQN Hepatitis A Lab Results  Component Value Date   HAV NON-REACTIVE 07/10/2022   RPR and STI Lab Results  Component Value Date   LABRPR NON-REACTIVE 07/23/2023   LABRPR NON-REACTIVE 05/30/2023   LABRPR NON-REACTIVE 04/29/2023   LABRPR NON-REACTIVE 01/14/2023   LABRPR NON-REACTIVE 07/10/2022    STI Results GC CT  05/30/2023  1:22 PM Negative    Negative    Negative  Negative    Negative    Negative   04/29/2023  1:37 PM Negative    Negative    Negative  Negative    Negative    Negative   01/14/2023  1:59 PM Negative    Negative    Negative   Negative    Negative    Negative   07/10/2022  3:34 PM Negative    Negative    Negative  Negative    Negative    Negative   10/13/2021  9:41 AM Negative  Negative   09/07/2019  4:32 PM Negative  Negative   07/29/2019  9:01 AM Negative  Negative  05/21/2019 11:08 AM Negative  Negative   02/12/2019  1:57 PM Negative  Negative   11/20/2018 12:00 AM Negative  Negative   05/12/2018 12:00 AM Negative  Negative   01/02/2018 12:00 AM Negative  Negative   03/28/2017 12:00 AM Negative  Negative   09/18/2016 12:00 AM Negative  Negative   12/06/2015 12:00 AM Negative  Negative   02/24/2015 12:00 AM Negative  Negative     Assessment: Walter Howell presents to clinic today for PrEP follow-up. He has been receiving Apretude  and restarted his injection series after missed doses in March 2025. Last received Apretude  in May. Would like to transition to an oral agent rather than continue Apretude  at this time. Prefers Descovy which is covered through Medicaid at no charge. Will repeat HIV RNA today given tail period of Apretude . Will also need to repeat lipid screening as he will be starting Descovy. [facility] [address].   Last STI screening was in April and was negative. Has been active with new sexual partners since last visit; will check RPR and urine/oral/rectal cytologies today. Continues using doxyPEP without any concerns or questions. Patient requests condoms which were provided today.   Eligible for HAV and HBV which he defers today; would like to discuss and start at next visit.   Plan: - Check HIV RNA, lipid panel, RPR, and urine/oral/rectal cytologies - If HIV RNA negative, start Descovy x 3 months - Follow-up with Cassie on 12/23/23   Alan Geralds, PharmD, CPP, BCIDP, AAHIVP Clinical Pharmacist Practitioner Infectious Diseases Clinical Pharmacist [facility] 09/26/2023, 10:43 AM

## 2023-09-26 NOTE — Telephone Encounter (Signed)
 Pharmacy Patient Advocate Encounter  Insurance verification completed.   The patient is insured through [employer]   Ran test claim for Descovy. Currently a quantity of 30 is a 30 day supply and the co-pay is $0.00 .   This test claim was processed through [facility]- copay amounts may vary at other pharmacies due to pharmacy/plan contracts, or as the patient moves through the different stages of their insurance plan.

## 2023-09-28 LAB — HIV-1 RNA QUANT-NO REFLEX-BLD
HIV 1 RNA Quant: NOT DETECTED {copies}/mL
HIV-1 RNA Quant, Log: NOT DETECTED {Log_copies}/mL

## 2023-09-28 LAB — RPR: RPR Ser Ql: NONREACTIVE

## 2023-09-28 LAB — LIPID PANEL
Cholesterol: 156 mg/dL (ref <200)
HDL: 37 mg/dL — ABNORMAL LOW (ref >=40)
LDL Cholesterol (Calc): 88 mg/dL
Non-HDL Cholesterol (Calc): 119 mg/dL (ref <130)
Total CHOL/HDL Ratio: 4.2 (calc) (ref <5.0)
Triglycerides: 217 mg/dL — ABNORMAL HIGH (ref <150)

## 2023-10-03 ENCOUNTER — Other Ambulatory Visit: Payer: Self-pay | Admitting: Pharmacist

## 2023-10-03 DIAGNOSIS — Z2981 Encounter for HIV pre-exposure prophylaxis: Secondary | ICD-10-CM

## 2023-10-03 MED ORDER — DESCOVY 200-25 MG PO TABS: 1.0000 | ORAL_TABLET | Freq: Every day | ORAL | 2 refills | Status: DC

## 2023-10-14 ENCOUNTER — Ambulatory Visit: Payer: Self-pay | Admitting: Nurse Practitioner

## 2023-10-15 ENCOUNTER — Ambulatory Visit: Payer: Self-pay | Admitting: Nurse Practitioner

## 2023-10-17 ENCOUNTER — Other Ambulatory Visit: Payer: Self-pay

## 2023-10-17 NOTE — Progress Notes (Signed)
 Patient will be disenrolled from CR due to them [facility]s [facility] [address].

## 2023-10-25 ENCOUNTER — Other Ambulatory Visit: Payer: Self-pay | Admitting: Nurse Practitioner

## 2023-10-25 DIAGNOSIS — I10 Essential (primary) hypertension: Secondary | ICD-10-CM

## 2023-11-06 ENCOUNTER — Other Ambulatory Visit: Payer: Self-pay | Admitting: Nurse Practitioner

## 2023-11-06 DIAGNOSIS — I10 Essential (primary) hypertension: Secondary | ICD-10-CM

## 2023-11-14 ENCOUNTER — Encounter: Payer: Self-pay | Admitting: Nurse Practitioner

## 2023-11-14 ENCOUNTER — Ambulatory Visit (INDEPENDENT_AMBULATORY_CARE_PROVIDER_SITE_OTHER): Payer: Self-pay | Admitting: Nurse Practitioner

## 2023-11-14 VITALS — BP 140/82 | HR 63 | Temp 98.5°F | Wt 225.6 lb

## 2023-11-14 DIAGNOSIS — R42 Dizziness and giddiness: Secondary | ICD-10-CM

## 2023-11-14 DIAGNOSIS — J4520 Mild intermittent asthma, uncomplicated: Secondary | ICD-10-CM | POA: Diagnosis not present

## 2023-11-14 DIAGNOSIS — K219 Gastro-esophageal reflux disease without esophagitis: Secondary | ICD-10-CM

## 2023-11-14 DIAGNOSIS — Z113 Encounter for screening for infections with a predominantly sexual mode of transmission: Secondary | ICD-10-CM | POA: Diagnosis not present

## 2023-11-14 DIAGNOSIS — Z2981 Encounter for HIV pre-exposure prophylaxis: Secondary | ICD-10-CM | POA: Diagnosis not present

## 2023-11-14 MED ORDER — DESCOVY 200-25 MG PO TABS: 1.0000 | ORAL_TABLET | Freq: Every day | ORAL | 2 refills | Status: DC

## 2023-11-14 MED ORDER — DESCOVY 200-25 MG PO TABS: 1.0000 | ORAL_TABLET | Freq: Every day | ORAL | 2 refills | Status: AC

## 2023-11-14 MED ORDER — ALBUTEROL SULFATE HFA 108 (90 BASE) MCG/ACT IN AERS: INHALATION_SPRAY | RESPIRATORY_TRACT | 2 refills | Status: AC

## 2023-11-14 MED ORDER — OMEPRAZOLE 20 MG PO CPDR: 20.0000 mg | DELAYED_RELEASE_CAPSULE | Freq: Every day | ORAL | 1 refills | Status: AC

## 2023-11-14 MED ORDER — MECLIZINE HCL 25 MG PO TABS: 25.0000 mg | ORAL_TABLET | Freq: Three times a day (TID) | ORAL | 6 refills | Status: AC | PRN

## 2023-11-14 NOTE — Progress Notes (Signed)
 Subjective   Patient ID: Walter Howell, male    DOB: 1985/06/11, 38 y.o.   MRN: [id number]  Chief Complaint  Patient presents with   Medical Management of Chronic Issues    Patient stated that he needs refills    Exposure to STD    Patient would like to come get his blood drawn on Monday for STD and a urine test    Referring provider: Paseda, Folashade R, FNP  Penne Carlin Edison is a 38 y.o. male with Past Medical History: No date: Anxiety No date: Asthma 01/2020: Coronavirus infection No date: COVID No date: Dizziness No date: GERD (gastroesophageal reflux disease) No date: Hypertension No date: Seasonal allergies No date: Vertigo No date: Vitamin D  deficiency  HPI: HPI  Allergies  Allergen Reactions   Prednisone      makes him feel weird    Immunization History  Administered Date(s) Administered   PFIZER(Purple Top)SARS-COV-2 Vaccination 05/24/2019, 08/10/2019   Tdap 10/08/2021    Tobacco History: Social History   Tobacco Use  Smoking Status Former   Passive exposure: Never  Smokeless Tobacco Never   Counseling given: Not Answered   Outpatient Encounter Medications as of 11/14/2023  Medication Sig   acetaminophen  (TYLENOL ) 500 MG tablet Take 1,000 mg by mouth every 6 (six) hours as needed for mild pain, moderate pain, fever or headache.   cetirizine  (ZYRTEC ) 10 MG tablet Take 1 tablet (10 mg total) by mouth daily.   losartan -hydrochlorothiazide  (HYZAAR) 100-25 MG tablet TAKE 1/2 TABLET BY MOUTH DAILY   Multiple Vitamin (MULTIVITAMIN WITH MINERALS) TABS tablet Take 1 tablet by mouth daily.   albuterol  (VENTOLIN  HFA) 108 (90 Base) MCG/ACT inhaler TAKE 2 PUFFS BY MOUTH EVERY 6 HOURS AS NEEDED FOR WHEEZE OR SHORTNESS OF BREATH   benzonatate  (TESSALON ) 100 MG capsule Take 1 capsule (100 mg total) by mouth 3 (three) times daily as needed.   Budesonide  90 MCG/ACT inhaler Inhale 1 puff into the lungs 2 (two) times daily. (Patient not taking: Reported  on 01/08/2022)   cyclobenzaprine  (FLEXERIL ) 5 MG tablet Take 1 tablet (5 mg total) by mouth 3 (three) times daily as needed for muscle spasms.   doxycycline  (VIBRA -TABS) 100 MG tablet Take 2 tablets (200 mg) by mouth 24-72 hours after unprotected sex   emtricitabine -tenofovir  AF (DESCOVY ) 200-25 MG tablet Take 1 tablet by mouth daily.   lidocaine  (LIDODERM ) 5 % Place 1 patch onto the skin daily. Remove & Discard patch within 12 hours or as directed by MD (Patient not taking: Reported on 01/09/2023)   Lidocaine  HCl-Benzyl Alcohol (SALONPAS  LIDOCAINE  PLUS) 4-10 % CREA Apply 1 application. topically every 8 (eight) hours. (Patient not taking: Reported on 01/08/2022)   meclizine  (ANTIVERT ) 25 MG tablet Take 1 tablet (25 mg total) by mouth 3 (three) times daily as needed for dizziness.   omeprazole  (PRILOSEC) 20 MG capsule Take 1 capsule (20 mg total) by mouth daily.   ondansetron  (ZOFRAN ) 4 MG tablet Take 1 tablet (4 mg total) by mouth every 8 (eight) hours as needed for nausea or vomiting. (Patient not taking: Reported on 11/14/2023)   [DISCONTINUED] albuterol  (VENTOLIN  HFA) 108 (90 Base) MCG/ACT inhaler TAKE 2 PUFFS BY MOUTH EVERY 6 HOURS AS NEEDED FOR WHEEZE OR SHORTNESS OF BREATH   [DISCONTINUED] emtricitabine -tenofovir  AF (DESCOVY ) 200-25 MG tablet Take 1 tablet by mouth daily.   [DISCONTINUED] emtricitabine -tenofovir  AF (DESCOVY ) 200-25 MG tablet Take 1 tablet by mouth daily.   [DISCONTINUED] meclizine  (ANTIVERT ) 25 MG tablet Take 1 tablet (25  mg total) by mouth 3 (three) times daily as needed for dizziness.   [DISCONTINUED] omeprazole  (PRILOSEC) 20 MG capsule TAKE 1 CAPSULE BY MOUTH EVERY DAY   No facility-administered encounter medications on file as of 11/14/2023.    Review of Systems  Review of Systems   Objective:   BP (!) 140/82   Pulse 63   Temp 98.5 F (36.9 C) (Oral)   Wt 225 lb 9.6 oz (102.3 kg)   SpO2 95%   BMI 31.46 kg/m   Wt Readings from Last 5 Encounters:  11/14/23  225 lb 9.6 oz (102.3 kg)  05/16/23 225 lb (102.1 kg)  04/07/23 215 lb (97.5 kg)  01/09/23 230 lb (104.3 kg)  10/09/22 231 lb 3.2 oz (104.9 kg)     Physical Exam    Assessment & Plan:   Screen for STD (sexually transmitted disease) -     Chlamydia/Gonococcus/Trichomonas, NAA; Future -     RPR+HIV+GC+CT Panel; Future  Mild intermittent asthma without complication -     Albuterol  Sulfate HFA; TAKE 2 PUFFS BY MOUTH EVERY 6 HOURS AS NEEDED FOR WHEEZE OR SHORTNESS OF BREATH  Dispense: 18 each; Refill: 2  Encounter for HIV pre-exposure prophylaxis -     Descovy ; Take 1 tablet by mouth daily.  Dispense: 30 tablet; Refill: 2  Dizziness -     Meclizine  HCl; Take 1 tablet (25 mg total) by mouth 3 (three) times daily as needed for dizziness.  Dispense: 90 tablet; Refill: 6  Gastroesophageal reflux disease without esophagitis -     Omeprazole ; Take 1 capsule (20 mg total) by mouth daily.  Dispense: 90 capsule; Refill: 1     Return in about 6 months (around 05/13/2024) for with [facility].   Bascom GORMAN Borer, NP 11/14/2023

## 2023-11-18 ENCOUNTER — Other Ambulatory Visit: Payer: Self-pay

## 2023-11-18 DIAGNOSIS — Z113 Encounter for screening for infections with a predominantly sexual mode of transmission: Secondary | ICD-10-CM

## 2023-11-21 ENCOUNTER — Ambulatory Visit: Payer: Self-pay | Admitting: Nurse Practitioner

## 2023-11-21 LAB — RPR+HIV+GC+CT PANEL
HIV Screen 4th Generation wRfx: NONREACTIVE
RPR Ser Ql: NONREACTIVE

## 2023-11-21 LAB — CHLAMYDIA/GONOCOCCUS/TRICHOMONAS, NAA
Chlamydia by NAA: NEGATIVE
Gonococcus by NAA: NEGATIVE
Trich vag by NAA: NEGATIVE

## 2023-12-20 NOTE — Progress Notes (Deleted)
 HPI: Walter Howell is a 38 y.o. male who presents to the [facility] pharmacy clinic for HIV PrEP follow-up.  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   Exposure to gonorrhea 08/21/2022   Annual physical exam 04/10/2022   High risk homosexual behavior 04/10/2022   Marijuana user 04/10/2022   Blurry vision, bilateral 04/10/2022   Hyperkalemia 01/10/2022   Chronic right shoulder pain 01/08/2022   High risk bisexual behavior 01/08/2022   Mild intermittent asthma without complication 09/04/2019   Obesity with serious comorbidity 09/04/2019   Benign paroxysmal positional vertigo 07/23/2017   TMJ (temporomandibular joint disorder) 07/23/2017   Gastroesophageal reflux disease without esophagitis 07/23/2017   Chest pain 01/23/2017   History of herniated intervertebral disc 12/25/2016   Palpitations 12/25/2016   Anxiety and depression 12/06/2015   Chronic bilateral low back pain without sciatica 12/06/2015   Essential hypertension 07/08/2013    Patient's Medications  New Prescriptions   No medications on file  Previous Medications   ACETAMINOPHEN  (TYLENOL ) 500 MG TABLET    Take 1,000 mg by mouth every 6 (six) hours as needed for mild pain, moderate pain, fever or headache.   ALBUTEROL  (VENTOLIN  HFA) 108 (90 BASE) MCG/ACT INHALER    TAKE 2 PUFFS BY MOUTH EVERY 6 HOURS AS NEEDED FOR WHEEZE OR SHORTNESS OF BREATH   BENZONATATE  (TESSALON ) 100 MG CAPSULE    Take 1 capsule (100 mg total) by mouth 3 (three) times daily as needed.   BUDESONIDE  90 MCG/ACT INHALER    Inhale 1 puff into the lungs 2 (two) times daily.   CETIRIZINE  (ZYRTEC ) 10 MG TABLET    Take 1 tablet (10 mg total) by mouth daily.   CYCLOBENZAPRINE  (FLEXERIL ) 5 MG TABLET    Take 1 tablet (5 mg total) by mouth 3 (three) times daily as needed for muscle spasms.   DOXYCYCLINE  (VIBRA -TABS) 100 MG TABLET    Take 2 tablets (200 mg) by mouth 24-72 hours after  unprotected sex   EMTRICITABINE -TENOFOVIR  AF (DESCOVY ) 200-25 MG TABLET    Take 1 tablet by mouth daily.   LIDOCAINE  (LIDODERM ) 5 %    Place 1 patch onto the skin daily. Remove & Discard patch within 12 hours or as directed by MD   LIDOCAINE  HCL-BENZYL ALCOHOL (SALONPAS  LIDOCAINE  PLUS) 4-10 % CREA    Apply 1 application. topically every 8 (eight) hours.   LOSARTAN -HYDROCHLOROTHIAZIDE  (HYZAAR) 100-25 MG TABLET    TAKE 1/2 TABLET BY MOUTH DAILY   MECLIZINE  (ANTIVERT ) 25 MG TABLET    Take 1 tablet (25 mg total) by mouth 3 (three) times daily as needed for dizziness.   MULTIPLE VITAMIN (MULTIVITAMIN WITH MINERALS) TABS TABLET    Take 1 tablet by mouth daily.   OMEPRAZOLE  (PRILOSEC) 20 MG CAPSULE    Take 1 capsule (20 mg total) by mouth daily.   ONDANSETRON  (ZOFRAN ) 4 MG TABLET    Take 1 tablet (4 mg total) by mouth every 8 (eight) hours as needed for nausea or vomiting.  Modified Medications   No medications on file  Discontinued Medications   No medications on file       07/10/2022    3:12 PM  [facility] HIV PREP FLOWSHEET RESULTS  Insurance Status Uninsured  Gender at birth Male  Gender identity cis-Male  Sex Partners Men only  # sex partners past 3-6 mos 4  Sex activity preferences Oral;Insertive and receptive  Condom use Yes  %  condom use 70  Treated for STI? No  PrEP Eligibility Yes    Labs:  SCr: Lab Results  Component Value Date   CREATININE 0.70 04/07/2023   CREATININE 0.94 10/10/2022   CREATININE 0.94 07/07/2022   CREATININE 0.84 01/09/2022   CREATININE 1.02 05/15/2021   HIV Lab Results  Component Value Date   HIV Non Reactive 11/18/2023   HIV Non Reactive 10/10/2022   HIV NON-REACTIVE 07/10/2022   HIV Non Reactive 04/10/2022   HIV Non Reactive 10/13/2021   Hepatitis B Lab Results  Component Value Date   HEPBSAG Negative 10/10/2022   HEPBCAB Negative 10/10/2022   Hepatitis C No results found for: HEPCAB, HCVRNAPCRQN Hepatitis A Lab Results  Component  Value Date   HAV NON-REACTIVE 07/10/2022   RPR and STI Lab Results  Component Value Date   LABRPR Non Reactive 11/18/2023   LABRPR NON-REACTIVE 09/26/2023   LABRPR NON-REACTIVE 07/23/2023   LABRPR NON-REACTIVE 05/30/2023   LABRPR NON-REACTIVE 04/29/2023    STI Results GC CT  Latest Ref Rng & Units  Negative  11/18/2023 11:45 AM  Negative   09/26/2023  1:18 PM Negative    Negative    Negative  Negative    Negative    Negative   05/30/2023  1:22 PM Negative    Negative    Negative  Negative    Negative    Negative   04/29/2023  1:37 PM Negative    Negative    Negative  Negative    Negative    Negative   01/14/2023  1:59 PM Negative    Negative    Negative  Negative    Negative    Negative   07/10/2022  3:34 PM Negative    Negative    Negative  Negative    Negative    Negative   10/13/2021  9:41 AM Negative  Negative   09/07/2019  4:32 PM Negative  Negative   07/29/2019  9:01 AM Negative  Negative   05/21/2019 11:08 AM Negative  Negative   02/12/2019  1:57 PM Negative  Negative   11/20/2018 12:00 AM Negative  Negative   05/12/2018 12:00 AM Negative  Negative   01/02/2018 12:00 AM Negative  Negative   03/28/2017 12:00 AM Negative  Negative   09/18/2016 12:00 AM Negative  Negative   12/06/2015 12:00 AM Negative  Negative   02/24/2015 12:00 AM Negative  Negative     Assessment: Ivo presents today for his 43-month follow up for HIV prevention. He was previously on Apretude  injections but transitioned to oral Descovy  back in July. He is tolerating it well without any side effects or issues. He was seen by his PCP on 11/14/23 and had STI testing at this visit, which was negative.   Labs: Last HIV ab was negative on ***;   Eligible vaccinations: Due for annual flu vaccine today; ***  Plan: - HIV ab *** - Descovy  x 3 months if HIV negative - Follow up with me again on ***  Ayub Kirsh L. Ronnell Makarewicz, PharmD, BCIDP, AAHIVP, CPP Clinical Pharmacist Practitioner  - Infectious Diseases Clinical Pharmacist Lead - [facility] for [facility]ious Disease

## 2023-12-23 ENCOUNTER — Ambulatory Visit: Payer: Self-pay | Admitting: Pharmacist

## 2023-12-23 DIAGNOSIS — Z2981 Encounter for HIV pre-exposure prophylaxis: Secondary | ICD-10-CM

## 2023-12-23 DIAGNOSIS — Z113 Encounter for screening for infections with a predominantly sexual mode of transmission: Secondary | ICD-10-CM

## 2024-01-10 ENCOUNTER — Emergency Department (HOSPITAL_BASED_OUTPATIENT_CLINIC_OR_DEPARTMENT_OTHER)
Admission: EM | Admit: 2024-01-10 | Discharge: 2024-01-10 | Disposition: A | Discharge status: Home / Self Care | Attending: Emergency Medicine | Admitting: Emergency Medicine

## 2024-01-10 ENCOUNTER — Other Ambulatory Visit: Payer: Self-pay

## 2024-01-10 ENCOUNTER — Emergency Department (HOSPITAL_BASED_OUTPATIENT_CLINIC_OR_DEPARTMENT_OTHER)

## 2024-01-10 ENCOUNTER — Encounter (HOSPITAL_BASED_OUTPATIENT_CLINIC_OR_DEPARTMENT_OTHER): Payer: Self-pay | Admitting: Emergency Medicine

## 2024-01-10 DIAGNOSIS — I6529 Occlusion and stenosis of unspecified carotid artery: Secondary | ICD-10-CM | POA: Diagnosis not present

## 2024-01-10 DIAGNOSIS — R519 Headache, unspecified: Secondary | ICD-10-CM | POA: Diagnosis present

## 2024-01-10 LAB — COMPREHENSIVE METABOLIC PANEL WITH GFR
ALT: 26 U/L (ref 0–44)
AST: 22 U/L (ref 15–41)
Albumin: 4.3 g/dL (ref 3.5–5.0)
Alkaline Phosphatase: 43 U/L (ref 38–126)
Anion gap: 9 (ref 5–15)
BUN: 12 mg/dL (ref 6–20)
CO2: 28 mmol/L (ref 22–32)
Calcium: 9.8 mg/dL (ref 8.9–10.3)
Chloride: 105 mmol/L (ref 98–111)
Creatinine, Ser: 0.82 mg/dL (ref 0.61–1.24)
GFR, Estimated: >60 mL/min (ref >60)
Glucose, Bld: 109 mg/dL — ABNORMAL HIGH (ref 70–99)
Potassium: 3.5 mmol/L (ref 3.5–5.1)
Sodium: 141 mmol/L (ref 135–145)
Total Bilirubin: 0.6 mg/dL (ref 0.0–1.2)
Total Protein: 7.4 g/dL (ref 6.5–8.1)

## 2024-01-10 LAB — CBC WITH DIFFERENTIAL/PLATELET
Abs Immature Granulocytes: 0.02 K/uL (ref 0.00–0.07)
Basophils Absolute: 0 K/uL (ref 0.0–0.1)
Basophils Relative: 0 %
Eosinophils Absolute: 0 K/uL (ref 0.0–0.5)
Eosinophils Relative: 1 %
HCT: 46.4 % (ref 39.0–52.0)
Hemoglobin: 16.1 g/dL (ref 13.0–17.0)
Immature Granulocytes: 0 %
Lymphocytes Relative: 34 %
Lymphs Abs: 2 K/uL (ref 0.7–4.0)
MCH: 32.1 pg (ref 26.0–34.0)
MCHC: 34.7 g/dL (ref 30.0–36.0)
MCV: 92.6 fL (ref 80.0–100.0)
Monocytes Absolute: 0.3 K/uL (ref 0.1–1.0)
Monocytes Relative: 5 %
Neutro Abs: 3.6 K/uL (ref 1.7–7.7)
Neutrophils Relative %: 60 %
Platelets: 213 K/uL (ref 150–400)
RBC: 5.01 MIL/uL (ref 4.22–5.81)
RDW: 13.2 % (ref 11.5–15.5)
WBC: 5.9 K/uL (ref 4.0–10.5)
nRBC: 0 % (ref 0.0–0.2)

## 2024-01-10 LAB — RAPID HIV SCREEN (HIV 1/2 AB+AG)
HIV 1/2 Antibodies: NONREACTIVE
HIV-1 P24 Antigen - HIV24: NONREACTIVE

## 2024-01-10 LAB — LIPID PANEL
Cholesterol: 168 mg/dL (ref 0–200)
HDL: 42 mg/dL (ref >40)
LDL Cholesterol: 114 mg/dL — ABNORMAL HIGH (ref 0–99)
Total CHOL/HDL Ratio: 4 ratio
Triglycerides: 60 mg/dL (ref <150)
VLDL: 12 mg/dL (ref 0–40)

## 2024-01-10 MED ORDER — KETOROLAC TROMETHAMINE 60 MG/2ML IM SOLN
60.0000 mg | Freq: Once | INTRAMUSCULAR | Status: AC
  Administered 2024-01-10: 60 mg via INTRAMUSCULAR
  Filled 2024-01-10: qty 2

## 2024-01-10 MED ORDER — ACETAMINOPHEN 500 MG PO TABS
1000.0000 mg | ORAL_TABLET | Freq: Once | ORAL | Status: AC
  Administered 2024-01-10: 1000 mg via ORAL
  Filled 2024-01-10: qty 2

## 2024-01-10 MED ORDER — IOHEXOL 350 MG/ML SOLN
100.0000 mL | Freq: Once | INTRAVENOUS | Status: AC | PRN
  Administered 2024-01-10: 75 mL via INTRAVENOUS

## 2024-01-10 MED ORDER — ASPIRIN 81 MG PO CHEW
81.0000 mg | CHEWABLE_TABLET | Freq: Every day | ORAL | 2 refills | Status: AC
Start: 2024-01-10 — End: ?

## 2024-01-10 NOTE — ED Provider Notes (Signed)
 [place] EMERGENCY DEPARTMENT AT [facility] Provider Note   CSN: [id number] Arrival date & time: 01/10/24  9653     Patient presents with: Headache   Walter Howell is a 38 y.o. male.   The patient presents with a chief complaint of headaches that have been occurring intermittently for the past few weeks. The headaches are described as being located at the front and back of the head, with exacerbation when lying down. The patient reports associated symptoms of tingling in the fingertips but denies any recent head trauma, nausea, vomiting, or fever. The patient has a history of migraines and has been using over-the-counter migraine medication, which provides partial relief. The patient recently visited a healthcare provider for an unrelated issue and has a follow-up appointment scheduled. History was obtained from the patient.   Headache      Prior to Admission medications   Medication Sig Start Date End Date Taking? Authorizing Provider  acetaminophen  (TYLENOL ) 500 MG tablet Take 1,000 mg by mouth every 6 (six) hours as needed for mild pain, moderate pain, fever or headache.    [provider]  albuterol  (VENTOLIN  HFA) 108 (90 Base) MCG/ACT inhaler TAKE 2 PUFFS BY MOUTH EVERY 6 HOURS AS NEEDED FOR WHEEZE OR SHORTNESS OF BREATH 11/14/23   Nichols, Tonya S, NP  benzonatate  (TESSALON ) 100 MG capsule Take 1 capsule (100 mg total) by mouth 3 (three) times daily as needed. 05/16/23   Silver Wonda LABOR, PA  Budesonide  90 MCG/ACT inhaler Inhale 1 puff into the lungs 2 (two) times daily. Patient not taking: Reported on 01/08/2022 07/27/20   Teresa Shelba SAUNDERS, NP  cetirizine  (ZYRTEC ) 10 MG tablet Take 1 tablet (10 mg total) by mouth daily. 09/13/20   Myrna Camelia HERO, NP  cyclobenzaprine  (FLEXERIL ) 5 MG tablet Take 1 tablet (5 mg total) by mouth 3 (three) times daily as needed for muscle spasms. 09/26/22   Paseda, Folashade R, FNP  doxycycline  (VIBRA -TABS) 100 MG tablet Take  2 tablets (200 mg) by mouth 24-72 hours after unprotected sex 04/29/23   Kuppelweiser, Cassie L, RPH-CPP  emtricitabine -tenofovir  AF (DESCOVY ) 200-25 MG tablet Take 1 tablet by mouth daily. 11/14/23   Oley Bascom RAMAN, NP  lidocaine  (LIDODERM ) 5 % Place 1 patch onto the skin daily. Remove & Discard patch within 12 hours or as directed by MD Patient not taking: Reported on 01/09/2023 05/15/21   Myrna Camelia HERO, NP  Lidocaine  HCl-Benzyl Alcohol (SALONPAS  LIDOCAINE  PLUS) 4-10 % CREA Apply 1 application. topically every 8 (eight) hours. Patient not taking: Reported on 01/08/2022 05/15/21   Myrna Camelia HERO, NP  losartan -hydrochlorothiazide  (HYZAAR) 100-25 MG tablet TAKE 1/2 TABLET BY MOUTH DAILY 11/07/23   Paseda, Folashade R, FNP  meclizine  (ANTIVERT ) 25 MG tablet Take 1 tablet (25 mg total) by mouth 3 (three) times daily as needed for dizziness. 11/14/23   Oley Bascom RAMAN, NP  Multiple Vitamin (MULTIVITAMIN WITH MINERALS) TABS tablet Take 1 tablet by mouth daily.    [provider]  omeprazole  (PRILOSEC) 20 MG capsule Take 1 capsule (20 mg total) by mouth daily. 11/14/23   Oley Bascom RAMAN, NP  ondansetron  (ZOFRAN ) 4 MG tablet Take 1 tablet (4 mg total) by mouth every 8 (eight) hours as needed for nausea or vomiting. Patient not taking: Reported on 11/14/2023 10/13/21   Lynwood Lenis, PA-C    Allergies: Prednisone     Review of Systems  Neurological:  Positive for headaches.    Updated Vital Signs BP 120/81  Pulse 61   Temp 98.3 F (36.8 C) (Oral)   Resp 15   Ht 5' 11 (1.803 m)   Wt 98.9 kg   SpO2 98%   BMI 30.40 kg/m   Physical Exam Vitals and nursing note reviewed.  Constitutional:      Appearance: He is well-developed.  HENT:     Head: Normocephalic and atraumatic.  Cardiovascular:     Rate and Rhythm: Normal rate.  Pulmonary:     Effort: Pulmonary effort is normal. No respiratory distress.  Abdominal:     General: There is no distension.  Musculoskeletal:        General:  Normal range of motion.     Cervical back: Normal range of motion.  Neurological:     Mental Status: He is alert and oriented to person, place, and time. Mental status is at baseline.     Cranial Nerves: No cranial nerve deficit.     Sensory: No sensory deficit.     Coordination: Romberg sign negative. Coordination normal.     (all labs ordered are listed, but only abnormal results are displayed) Labs Reviewed - No data to display  EKG: None  Radiology: No results found.   Procedures   Medications Ordered in the ED  ketorolac  (TORADOL ) injection 60 mg (60 mg Intramuscular Given 01/10/24 0747)                                   Medical Decision Making Amount and/or Complexity of Data Reviewed Labs: ordered. Radiology: ordered.  Risk OTC drugs. Prescription drug management.   Initial Evaluation:  Patient presents with persistent headaches for several weeks, worsening when lying down, and tingling in fingertips without head trauma. Plan:  Administer medication for headache relief. Perform CT of the head to rule out serious conditions such as cancer, bleed, or tumor. If CT is unremarkable, recommend follow-up with a neurologist for potential migraine management. Continue symptomatic treatment and monitor response.  Care transferred pending CT, reevaluation and disposition.    Lorette Mayo, MD 01/12/24 [phone]

## 2024-01-10 NOTE — ED Triage Notes (Addendum)
  Patient comes in with headache that has been intermittent for past 2 weeks.  Patient states he has been making diet changes including cutting out caffeine.  Has been taking tylenol  and excedrin migraine for pain and states it has helped.  Patient denies any fevers, nausea, vomiting, vision changes, or gait issues. No other neuro symptoms at this time.  Pain 7/10, pressure.    Last medication was excedrin around 2300.

## 2024-01-10 NOTE — ED Provider Notes (Signed)
  Physical Exam  BP 120/81   Pulse 61   Temp 98.3 F (36.8 C) (Oral)   Resp 15   Ht 5' 11 (1.803 m)   Wt 98.9 kg   SpO2 98%   BMI 30.40 kg/m   Physical Exam  Procedures  Procedures  ED Course / MDM   Clinical Course as of 01/10/24 1104  Fri Jan 10, 2024  0800 Assumed care from Dr Lorette. 38 yo M no relevant PMH who has had a headache x 2 weeks. Getting CT to RO brain tumor. Given toradol .  [RP]  0905 Discussed with Dr Michaela from neurology.  Given the advanced atherosclerotic disease that the patient has recommend CTA at this point in time as well as resending a lipid panel and RPR. [RP]  0921 Reexamined.  No focal neurologic deficits.  Headache is still 6/10 in severity.  Given Tylenol  [RP]  1101 CTA without acute findings but does show significant atherosclerotic disease. Discussed with Dr Michaela again. Recommends starting on baby aspirin  and fu with PCP for additional testing. Pt and mother informed of findings and increased risk of MI and CVA and the important of pcp fu for this. Return precautions discussed prior to dc.  [RP]    Clinical Course User Index [RP] Yolande Lamar BROCKS, MD   Medical Decision Making Amount and/or Complexity of Data Reviewed Labs: ordered. Radiology: ordered.  Risk OTC drugs. Prescription drug management.      Yolande Lamar BROCKS, MD 01/10/24 [phone]

## 2024-01-10 NOTE — ED Notes (Signed)
 Patient transported to CT

## 2024-01-10 NOTE — Discharge Instructions (Addendum)
 You were seen for your headache in the emergency department.   At home, please take tylenol  and ibuprofen  for your headache.  Take a baby aspirin  daily.   Check your MyChart online for the results of any tests that had not resulted by the time you left the emergency department.   Follow-up with your primary doctor in 2-3 days regarding your visit.    Return immediately to the emergency department if you experience any of the following: worsening headache, weakness or numbness, vision changes, or any other concerning symptoms.    Thank you for visiting our Emergency Department. It was a pleasure taking care of you today.

## 2024-01-11 LAB — RPR: RPR Ser Ql: NONREACTIVE

## 2024-03-06 ENCOUNTER — Ambulatory Visit: Payer: Self-pay

## 2024-03-06 ENCOUNTER — Other Ambulatory Visit: Payer: Self-pay | Admitting: Nurse Practitioner

## 2024-03-06 DIAGNOSIS — I10 Essential (primary) hypertension: Secondary | ICD-10-CM

## 2024-03-06 MED ORDER — LOSARTAN POTASSIUM-HCTZ 100-25 MG PO TABS: 0.5000 | ORAL_TABLET | Freq: Every day | ORAL | 0 refills | Status: DC

## 2024-03-06 NOTE — Telephone Encounter (Signed)
 This RN called [facility] to notify of ED refusal. This RN spoke to Krista.

## 2024-03-06 NOTE — Telephone Encounter (Signed)
 Copied from CRM #[id number]. Topic: Clinical - Medication Refill >> Mar 06, 2024  8:26 AM Delon HERO wrote: Medication: losartan -hydrochlorothiazide  (HYZAAR) 100-25 MG tablet [[id number]]  Has the patient contacted their pharmacy? Yes (Agent: If no, request that the patient contact the pharmacy for the refill. If patient does not wish to contact the pharmacy document the reason why and proceed with request.) (Agent: If yes, when and what did the pharmacy advise?)  This is the patient's preferred pharmacy:  [id number] - [place], [place] - [address] AT [facility] [address] [place] [address] Phone: [phone] Fax: [phone]   Is this the correct pharmacy for this prescription? Yes If no, delete pharmacy and type the correct one.   Has the prescription been filled recently? Yes  Is the patient out of the medication? Yes  Has the patient been seen for an appointment in the last year OR does the patient have an upcoming appointment? Yes  Can we respond through MyChart? Yes  Agent: Please be advised that Rx refills may take up to 3 business days. We ask that you follow-up with your pharmacy.

## 2024-03-06 NOTE — Telephone Encounter (Addendum)
 FYI Only or Action Required?: Action required by provider: refused ED.  Patient was last seen in primary care on 11/14/2023 by Oley Bascom RAMAN, NP.  Called Nurse Triage reporting Chest Pain and Palpitations.  Symptoms began several weeks ago.  Interventions attempted: Nothing.  Symptoms are: unchanged.  Triage Disposition: Go to ED Now (Notify PCP)  Patient/caregiver understands and will follow disposition?: No, refuses disposition                              1. LOCATION: Where does it hurt?       Center of chest 2. RADIATION: Does the pain go anywhere else? (e.g., into neck, jaw, arms, back)     Denies radiation 3. ONSET: When did the chest pain begin? (Minutes, hours or days)      3 weeks ago 4. PATTERN: Does the pain come and go, or has it been constant since it started?  Does it get worse with exertion?      Intermittent, currently experiencing pain 5. DURATION: How long does it last (e.g., seconds, minutes, hours)     Lasts for 2-3 minutes at a time 6. SEVERITY: How bad is the pain?  (e.g., Scale 1-10; mild, moderate, or severe)     Mild 7. CARDIAC RISK FACTORS: Do you have any history of heart problems or risk factors for heart disease? (e.g., angina, prior heart attack; diabetes, high blood pressure, high cholesterol, smoker, or strong family history of heart disease)     HTN, per chart 10. OTHER SYMPTOMS: Do you have any other symptoms? (e.g., dizziness, nausea, vomiting, sweating, fever, difficulty breathing, cough)       Nausea, sweating at night, heart palpitations/rapid HR at times Denies difficulty breathing    This RN advised ED. Patient declined and stated he does not like going to the ED. Please advise.   Reason for Disposition  [1] Chest pain (or angina) comes and goes AND [2] is happening more often (increasing in frequency) or getting worse (increasing in severity)  (Exception: Chest pains that last only a few  seconds.)  Protocols used: Chest Pain-A-AH  Copied from CRM #[id number]. Topic: Clinical - Red Word Triage >> Mar 06, 2024  8:30 AM Delon HERO wrote: Red Word that prompted transfer to Nurse Triage: Patient is calling report that he have chest palpations and chest pain for 3 weeks.

## 2024-03-06 NOTE — Telephone Encounter (Signed)
FYI Raymond

## 2024-03-17 ENCOUNTER — Other Ambulatory Visit: Payer: Self-pay

## 2024-03-17 ENCOUNTER — Emergency Department (HOSPITAL_BASED_OUTPATIENT_CLINIC_OR_DEPARTMENT_OTHER)
Admission: EM | Admit: 2024-03-17 | Discharge: 2024-03-17 | Disposition: A | Discharge status: Home / Self Care | Attending: Emergency Medicine | Admitting: Emergency Medicine

## 2024-03-17 ENCOUNTER — Encounter (HOSPITAL_BASED_OUTPATIENT_CLINIC_OR_DEPARTMENT_OTHER): Payer: Self-pay

## 2024-03-17 DIAGNOSIS — Z7982 Long term (current) use of aspirin: Secondary | ICD-10-CM | POA: Diagnosis not present

## 2024-03-17 DIAGNOSIS — I10 Essential (primary) hypertension: Secondary | ICD-10-CM | POA: Diagnosis not present

## 2024-03-17 DIAGNOSIS — Z79899 Other long term (current) drug therapy: Secondary | ICD-10-CM | POA: Diagnosis not present

## 2024-03-17 DIAGNOSIS — Z202 Contact with and (suspected) exposure to infections with a predominantly sexual mode of transmission: Secondary | ICD-10-CM | POA: Diagnosis present

## 2024-03-17 DIAGNOSIS — R030 Elevated blood-pressure reading, without diagnosis of hypertension: Secondary | ICD-10-CM

## 2024-03-17 LAB — URINALYSIS, ROUTINE W REFLEX MICROSCOPIC
Bacteria, UA: NONE SEEN
Bilirubin Urine: NEGATIVE
Glucose, UA: NEGATIVE mg/dL
Ketones, ur: NEGATIVE mg/dL
Leukocytes,Ua: NEGATIVE
Nitrite: NEGATIVE
Protein, ur: NEGATIVE mg/dL
Specific Gravity, Urine: 1.016 (ref 1.005–1.030)
pH: 6 (ref 5.0–8.0)

## 2024-03-17 LAB — BASIC METABOLIC PANEL WITH GFR
Anion gap: 10 (ref 5–15)
BUN: 14 mg/dL (ref 6–20)
CO2: 28 mmol/L (ref 22–32)
Calcium: 9.4 mg/dL (ref 8.9–10.3)
Chloride: 103 mmol/L (ref 98–111)
Creatinine, Ser: 0.95 mg/dL (ref 0.61–1.24)
GFR, Estimated: >60 mL/min
Glucose, Bld: 100 mg/dL — ABNORMAL HIGH (ref 70–99)
Potassium: 4.1 mmol/L (ref 3.5–5.1)
Sodium: 142 mmol/L (ref 135–145)

## 2024-03-17 MED ORDER — LOSARTAN POTASSIUM-HCTZ 100-25 MG PO TABS: 0.5000 | ORAL_TABLET | Freq: Every day | ORAL | 0 refills | Status: DC

## 2024-03-17 NOTE — ED Provider Notes (Signed)
 " [facility] AT [facility] Provider Note   CSN: [id number] Arrival date & time: 03/17/24  1738     Patient presents with: Hypertension   Walter Howell is a 39 y.o. male patient with past medical history of hypertension presents to emergency room with complaint of elevated blood pressure readings at home.  He reports he has been out of his losartan  for about a week and since then he has been having elevated blood pressure and he is requesting a refill. Has had intermittent posterior headache with elevated blood pressure, but reports the is not severe and mild. He reports he does not have any symptoms right now this including no headache, focal deficit chest pain shortness of breath. Patient also is requesting to be STD tested.  He denies any symptoms but does report he has had unprotected sex recently.    Hypertension       Prior to Admission medications  Medication Sig Start Date End Date Taking? Authorizing Provider  acetaminophen  (TYLENOL ) 500 MG tablet Take 1,000 mg by mouth every 6 (six) hours as needed for mild pain, moderate pain, fever or headache.    [provider]  albuterol  (VENTOLIN  HFA) 108 (90 Base) MCG/ACT inhaler TAKE 2 PUFFS BY MOUTH EVERY 6 HOURS AS NEEDED FOR WHEEZE OR SHORTNESS OF BREATH 11/14/23   Oley Bascom RAMAN, NP  aspirin  81 MG chewable tablet Chew 1 tablet (81 mg total) by mouth daily. 01/10/24   Yolande Lamar BROCKS, MD  benzonatate  (TESSALON ) 100 MG capsule Take 1 capsule (100 mg total) by mouth 3 (three) times daily as needed. 05/16/23   Silver Wonda LABOR, PA  Budesonide  90 MCG/ACT inhaler Inhale 1 puff into the lungs 2 (two) times daily. Patient not taking: Reported on 01/08/2022 07/27/20   Teresa Shelba SAUNDERS, NP  cetirizine  (ZYRTEC ) 10 MG tablet Take 1 tablet (10 mg total) by mouth daily. 09/13/20   Myrna Camelia HERO, NP  cyclobenzaprine  (FLEXERIL ) 5 MG tablet Take 1 tablet (5 mg total) by mouth 3 (three) times daily as  needed for muscle spasms. 09/26/22   Paseda, Folashade R, FNP  doxycycline  (VIBRA -TABS) 100 MG tablet Take 2 tablets (200 mg) by mouth 24-72 hours after unprotected sex 04/29/23   Kuppelweiser, Cassie L, RPH-CPP  emtricitabine -tenofovir  AF (DESCOVY ) 200-25 MG tablet Take 1 tablet by mouth daily. 11/14/23   Oley Bascom RAMAN, NP  lidocaine  (LIDODERM ) 5 % Place 1 patch onto the skin daily. Remove & Discard patch within 12 hours or as directed by MD Patient not taking: Reported on 01/09/2023 05/15/21   Myrna Camelia HERO, NP  Lidocaine  HCl-Benzyl Alcohol (SALONPAS  LIDOCAINE  PLUS) 4-10 % CREA Apply 1 application. topically every 8 (eight) hours. Patient not taking: Reported on 01/08/2022 05/15/21   Myrna Camelia HERO, NP  losartan -hydrochlorothiazide  (HYZAAR) 100-25 MG tablet Take 0.5 tablets by mouth daily. 03/17/24   Alonnie Bieker, Warren SAILOR, PA-C  meclizine  (ANTIVERT ) 25 MG tablet Take 1 tablet (25 mg total) by mouth 3 (three) times daily as needed for dizziness. 11/14/23   Oley Bascom RAMAN, NP  Multiple Vitamin (MULTIVITAMIN WITH MINERALS) TABS tablet Take 1 tablet by mouth daily.    [provider]  omeprazole  (PRILOSEC) 20 MG capsule Take 1 capsule (20 mg total) by mouth daily. 11/14/23   Oley Bascom RAMAN, NP  ondansetron  (ZOFRAN ) 4 MG tablet Take 1 tablet (4 mg total) by mouth every 8 (eight) hours as needed for nausea or vomiting. Patient not taking: Reported on 11/14/2023 10/13/21  Lynwood Lenis, PA-C    Allergies: Prednisone     Review of Systems  Constitutional:  Positive for activity change.    Updated Vital Signs BP (!) 160/92   Pulse 66   Temp 98.6 F (37 C)   Resp 17   Ht 5' 11 (1.803 m)   Wt 105.2 kg   SpO2 98%   BMI 32.36 kg/m   Physical Exam Vitals and nursing note reviewed.  Constitutional:      General: He is not in acute distress.    Appearance: He is not toxic-appearing.  HENT:     Head: Normocephalic and atraumatic.  Eyes:     General: No scleral icterus.     Conjunctiva/sclera: Conjunctivae normal.  Cardiovascular:     Rate and Rhythm: Normal rate and regular rhythm.     Pulses: Normal pulses.     Heart sounds: Normal heart sounds.  Pulmonary:     Effort: Pulmonary effort is normal. No respiratory distress.     Breath sounds: Normal breath sounds.  Abdominal:     General: Abdomen is flat. Bowel sounds are normal.     Palpations: Abdomen is soft.     Tenderness: There is no abdominal tenderness.  Skin:    General: Skin is warm and dry.     Findings: No lesion.  Neurological:     General: No focal deficit present.     Mental Status: He is alert and oriented to person, place, and time. Mental status is at baseline.     Comments: No focal neurological deficit.     (all labs ordered are listed, but only abnormal results are displayed) Labs Reviewed  BASIC METABOLIC PANEL WITH GFR - Abnormal; Notable for the following components:      Result Value   Glucose, Bld 100 (*)    All other components within normal limits  URINALYSIS, ROUTINE W REFLEX MICROSCOPIC - Abnormal; Notable for the following components:   Hgb urine dipstick TRACE (*)    All other components within normal limits  HIV ANTIBODY (ROUTINE TESTING W REFLEX)  SYPHILIS: RPR W/REFLEX TO RPR TITER AND TREPONEMAL ANTIBODIES, TRADITIONAL SCREENING AND DIAGNOSIS ALGORITHM  GC/CHLAMYDIA PROBE AMP ([facility]) NOT AT [facility]    EKG: None  Radiology: No results found.   Procedures   Medications Ordered in the ED - No data to display  Clinical Course as of 03/17/24 1914  Tue Mar 17, 2024  1851 Waiting on BMP. No one is monitoring losartan , wanting refill. As long as BMP is okay. Wanted STI evaluation without empiric treatment. No Sx currently.  [CB]    Clinical Course User Index [CB] Beola Terrall RAMAN, PA-C                                 Medical Decision Making Amount and/or Complexity of Data Reviewed Labs: ordered.  Risk Prescription drug management.   This  patient presents to the ED for concern of HTN, this involves an extensive number of treatment options, and is a complaint that carries with it a high risk of complications and morbidity.  The differential diagnosis includes tensive urgency, hypertensive emergency, ACS, CVA, encounter for STD screening   Co morbidities that complicate the patient evaluation  HTN   Additional history obtained:  Additional history obtained from patient was recently seen in November for symptoms of headache and also had head CT and CT angio which showed arthrosclerosis.  He was recommended to follow-up with Dr. Michaela neurology for further evaluation.   Lab Tests:  I personally interpreted labs.  The pertinent results include:   BMP Gonorrhea, chlamydia RPR and HIV screening    Cardiac Monitoring: / EKG:  The patient was maintained on a cardiac monitor.     Problem List / ED Course / Critical interventions / Medication management  Presents to emergency room with complaint of needing blood pressure medicine refilled.  On arrival he is hypertensive but otherwise vitals are stable.  He has no focal neurological deficit he denies chest pain or shortness of breath.  He denies any symptoms with this.  I did check a BMP which shows normal kidney function, normal electrolyte function so I will restart him on his losartan  hydrochlorothiazide .  He he has been out for several days.  He was instructed to follow-up with his primary care doctor for further management and refills which he is agreeable to. Patient is also requesting to be STD tested.  He denies any symptoms and he does not want empiric treatment.  STD testing was sent and he will monitor results on MyChart.        Final diagnoses:  Elevated blood pressure reading  Encounter for assessment of STD exposure    ED Discharge Orders          Ordered    losartan -hydrochlorothiazide  (HYZAAR) 100-25 MG tablet  Daily       Note to Pharmacy: DX  Code Needed  PATIENT NEEDS NEW PRESCRIPTION.   03/17/24 1833               Aleesia Henney, Warren SAILOR, PA-C 03/17/24 1914    Kingsley, Victoria K, DO 03/17/24 2323  "

## 2024-03-17 NOTE — Discharge Instructions (Signed)
 I have resent your blood pressure medicine to your pharmacy please continue to take as prescribed.  Please call your primary care doctor to schedule an appointment soon as possible for recheck of your symptoms and recheck of blood pressure.  STD testing is pending at time of discharge please monitor your results on MyChart.

## 2024-03-17 NOTE — ED Triage Notes (Addendum)
 Presents to ED with c/o HTN for five days. Highest 190s/110s per pt. Takes losartan  at home. Endorses headache.  Pt also requesting STD screening

## 2024-03-18 ENCOUNTER — Telehealth: Payer: Self-pay

## 2024-03-18 ENCOUNTER — Other Ambulatory Visit: Payer: Self-pay | Admitting: Nurse Practitioner

## 2024-03-18 ENCOUNTER — Other Ambulatory Visit: Payer: Self-pay

## 2024-03-18 DIAGNOSIS — G8929 Other chronic pain: Secondary | ICD-10-CM

## 2024-03-18 DIAGNOSIS — I10 Essential (primary) hypertension: Secondary | ICD-10-CM

## 2024-03-18 DIAGNOSIS — Z113 Encounter for screening for infections with a predominantly sexual mode of transmission: Secondary | ICD-10-CM

## 2024-03-18 LAB — GC/CHLAMYDIA PROBE AMP (~~LOC~~) NOT AT ARMC
Chlamydia: NEGATIVE
Comment: NEGATIVE
Comment: NORMAL
Neisseria Gonorrhea: NEGATIVE

## 2024-03-18 LAB — HIV ANTIBODY (ROUTINE TESTING W REFLEX): HIV Screen 4th Generation wRfx: NONREACTIVE

## 2024-03-18 LAB — SYPHILIS: RPR W/REFLEX TO RPR TITER AND TREPONEMAL ANTIBODIES, TRADITIONAL SCREENING AND DIAGNOSIS ALGORITHM: RPR Ser Ql: NONREACTIVE

## 2024-03-18 MED ORDER — LOSARTAN POTASSIUM-HCTZ 100-25 MG PO TABS: 0.5000 | ORAL_TABLET | Freq: Every day | ORAL | 0 refills | Status: AC

## 2024-03-18 MED ORDER — CYCLOBENZAPRINE HCL 5 MG PO TABS: 5.0000 mg | ORAL_TABLET | Freq: Three times a day (TID) | ORAL | 0 refills | Status: AC | PRN

## 2024-03-18 MED ORDER — DOXYCYCLINE HYCLATE 100 MG PO TABS: ORAL_TABLET | ORAL | 1 refills | Status: AC

## 2024-03-18 NOTE — Telephone Encounter (Signed)
 Please advise. Pt coming in to see you 03/27/24. Walter Howell

## 2024-03-18 NOTE — Transitions of Care (Post Inpatient/ED Visit) (Cosign Needed)
 "  03/18/2024  Name: Walter Howell MRN: [id number] DOB: [birth year]  Today's TOC FU Call Status: Today's TOC FU Call Status:: Successful TOC FU Call Completed TOC FU Call Complete Date: 03/18/24  Patient's Name and Date of Birth confirmed. Name, DOB  Transition Care Management Follow-up Telephone Call Date of Discharge: 03/17/24 Discharge Facility: [facility] ([facility]) Type of Discharge: Emergency Department Reason for ED Visit: Cardiac Conditions Cardiac Conditions Diagnosis:  (Bp reading) How have you been since you were released from the hospital?: Better Any questions or concerns?: No  Items Reviewed: Did you receive and understand the discharge instructions provided?: No Medications obtained,verified, and reconciled?: Yes (Medications Reviewed) Any new allergies since your discharge?: No Dietary orders reviewed?: NA Do you have support at home?: Yes  Medications Reviewed Today: Medications Reviewed Today     Reviewed by Victory Iha, RMA (Registered Medical Assistant) on 03/18/24 at 1210  Med List Status: <None>   Medication Order Taking? Sig Documenting Provider Last Dose Status Informant  acetaminophen  (TYLENOL ) 500 MG tablet [id number] Yes Take 1,000 mg by mouth every 6 (six) hours as needed for mild pain, moderate pain, fever or headache. [provider]  Active Self           Med Note ARCHIE LANETA HERO   Tue Dec 29, 2019  3:36 PM) As needed.   albuterol  (VENTOLIN  HFA) 108 (90 Base) MCG/ACT inhaler [id number] Yes TAKE 2 PUFFS BY MOUTH EVERY 6 HOURS AS NEEDED FOR WHEEZE OR SHORTNESS OF BREATH Nichols, Tonya S, NP  Active   aspirin  81 MG chewable tablet [id number] Yes Chew 1 tablet (81 mg total) by mouth daily. Yolande Lamar BROCKS, MD  Active   benzonatate  (TESSALON ) 100 MG capsule [id number]  Take 1 capsule (100 mg total) by mouth 3 (three) times daily as needed.  Patient not taking: Reported on 03/18/2024   Silver Wonda LABOR, [place]  Active   Budesonide   90 MCG/ACT inhaler [id number]  Inhale 1 puff into the lungs 2 (two) times daily.  Patient not taking: Reported on 03/18/2024   Teresa Shelba SAUNDERS, NP  Active   cetirizine  (ZYRTEC ) 10 MG tablet [id number] Yes Take 1 tablet (10 mg total) by mouth daily. Myrna Camelia HERO, NP  Active   cyclobenzaprine  (FLEXERIL ) 5 MG tablet [id number] Yes Take 1 tablet (5 mg total) by mouth 3 (three) times daily as needed for muscle spasms. Paseda, Folashade R, FNP  Active   doxycycline  (VIBRA -TABS) 100 MG tablet [id number]  Take 2 tablets (200 mg) by mouth 24-72 hours after unprotected sex  Patient not taking: Reported on 03/18/2024   Kuppelweiser, Cassie L, RPH-CPP  Active   emtricitabine -tenofovir  AF (DESCOVY ) 200-25 MG tablet [id number] Yes Take 1 tablet by mouth daily. Oley Bascom RAMAN, NP  Active   lidocaine  (LIDODERM ) 5 % [id number] Yes Place 1 patch onto the skin daily. Remove & Discard patch within 12 hours or as directed by MD Myrna Camelia HERO, NP  Active            Med Note ANNYE, [facility]   Tue Apr 10, 2022  3:29 PM) Refill   Lidocaine  HCl-Benzyl Alcohol (SALONPAS  LIDOCAINE  PLUS) 4-10 % CREA [id number]  Apply 1 application. topically every 8 (eight) hours.  Patient not taking: Reported on 01/08/2022   Myrna Camelia HERO, NP  Active   losartan -hydrochlorothiazide  [facility]) 100-25 MG tablet [id number] Yes Take 0.5 tablets by mouth daily. Barrett, Warren SAILOR, PA-C  Active   meclizine  (ANTIVERT ) 25 MG tablet [id number] Yes Take 1  tablet (25 mg total) by mouth 3 (three) times daily as needed for dizziness. Oley Bascom RAMAN, NP  Active   Multiple Vitamin (MULTIVITAMIN WITH MINERALS) TABS tablet [id number] Yes Take 1 tablet by mouth daily. [provider]  Active Self  omeprazole  (PRILOSEC) 20 MG capsule [id number] Yes Take 1 capsule (20 mg total) by mouth daily. Oley Bascom RAMAN, NP  Active   ondansetron  (ZOFRAN ) 4 MG tablet [id number]  Take 1 tablet (4 mg total) by mouth every 8 (eight) hours as needed for nausea  or vomiting.  Patient not taking: Reported on 03/18/2024   Lynwood Lenis, PA-C  Active             Home Care and Equipment/Supplies: Were Home Health Services Ordered?: NA Any new equipment or medical supplies ordered?: NA  Functional Questionnaire: Do you need assistance with bathing/showering or dressing?: No Do you need assistance with meal preparation?: No Do you need assistance with eating?: No Do you have difficulty maintaining continence: No Do you need assistance with getting out of bed/getting out of a chair/moving?: No Do you have difficulty managing or taking your medications?: No  Follow up appointments reviewed: PCP Follow-up appointment confirmed?: Yes Date of PCP follow-up appointment?: 03/27/24 Follow-up Provider: [facility] Follow-up appointment confirmed?: No Do you need transportation to your follow-up appointment?: No Do you understand care options if your condition(s) worsen?: Yes-patient verbalized understanding  SDOH Interventions Today    Flowsheet Row Most Recent Value  SDOH Interventions   Food Insecurity Interventions Intervention Not Indicated  Housing Interventions Intervention Not Indicated  Transportation Interventions Intervention Not Indicated  Utilities Interventions Intervention Not Indicated    SIGNATURE. Suzen Shove   CMA II  "

## 2024-03-27 ENCOUNTER — Inpatient Hospital Stay: Payer: Self-pay | Admitting: Nurse Practitioner

## 2024-04-10 ENCOUNTER — Inpatient Hospital Stay: Admitting: Nurse Practitioner

## 2024-05-13 ENCOUNTER — Ambulatory Visit: Payer: Self-pay | Admitting: Nurse Practitioner

## 2024-05-13 ENCOUNTER — Ambulatory Visit: Admitting: Nurse Practitioner
